# Patient Record
Sex: Male | Born: 1945 | Race: White | Hispanic: No | Marital: Married | Smoking: Never smoker
Health system: Southern US, Community
[De-identification: ages and names within clinical notes are randomized; demographics above are authoritative.]

## PROBLEM LIST (undated history)

## (undated) DIAGNOSIS — J986 Disorders of diaphragm: Secondary | ICD-10-CM

## (undated) DIAGNOSIS — J189 Pneumonia, unspecified organism: Secondary | ICD-10-CM

## (undated) DIAGNOSIS — Z8546 Personal history of malignant neoplasm of prostate: Secondary | ICD-10-CM

## (undated) DIAGNOSIS — R0902 Hypoxemia: Secondary | ICD-10-CM

## (undated) DIAGNOSIS — I4891 Unspecified atrial fibrillation: Secondary | ICD-10-CM

## (undated) DIAGNOSIS — I10 Essential (primary) hypertension: Secondary | ICD-10-CM

## (undated) DIAGNOSIS — C801 Malignant (primary) neoplasm, unspecified: Secondary | ICD-10-CM

## (undated) DIAGNOSIS — D649 Anemia, unspecified: Secondary | ICD-10-CM

## (undated) DIAGNOSIS — H539 Unspecified visual disturbance: Secondary | ICD-10-CM

## (undated) DIAGNOSIS — G4733 Obstructive sleep apnea (adult) (pediatric): Secondary | ICD-10-CM

## (undated) DIAGNOSIS — G629 Polyneuropathy, unspecified: Secondary | ICD-10-CM

## (undated) DIAGNOSIS — E785 Hyperlipidemia, unspecified: Secondary | ICD-10-CM

## (undated) HISTORY — DX: Pneumonia, unspecified organism: J18.9

## (undated) HISTORY — DX: Disorders of diaphragm: J98.6

## (undated) HISTORY — DX: Unspecified visual disturbance: H53.9

## (undated) HISTORY — PX: APPENDECTOMY: SHX54

## (undated) HISTORY — DX: Hypoxemia: R09.02

## (undated) HISTORY — PX: PROSTATECTOMY: SHX69

## (undated) HISTORY — DX: Unspecified atrial fibrillation: I48.91

## (undated) HISTORY — DX: Anemia, unspecified: D64.9

## (undated) HISTORY — DX: Hyperlipidemia, unspecified: E78.5

## (undated) HISTORY — DX: Malignant (primary) neoplasm, unspecified: C80.1

## (undated) HISTORY — PX: SHOULDER SURGERY: SHX246

## (undated) HISTORY — DX: Essential (primary) hypertension: I10

## (undated) HISTORY — DX: Personal history of malignant neoplasm of prostate: Z85.46

## (undated) HISTORY — DX: Polyneuropathy, unspecified: G62.9

## (undated) HISTORY — DX: Obstructive sleep apnea (adult) (pediatric): G47.33

---

## 1991-09-07 DIAGNOSIS — Z8546 Personal history of malignant neoplasm of prostate: Secondary | ICD-10-CM

## 1991-09-07 HISTORY — DX: Personal history of malignant neoplasm of prostate: Z85.46

## 2001-07-28 ENCOUNTER — Ambulatory Visit: Admission: RE | Admit: 2001-07-28 | Discharge: 2001-10-26 | Payer: Self-pay | Admitting: Radiation Oncology

## 2001-08-28 ENCOUNTER — Inpatient Hospital Stay (HOSPITAL_COMMUNITY): Admission: EM | Admit: 2001-08-28 | Discharge: 2001-08-29 | Payer: Self-pay | Admitting: Emergency Medicine

## 2001-09-05 ENCOUNTER — Ambulatory Visit: Admission: RE | Admit: 2001-09-05 | Discharge: 2001-09-05 | Payer: Self-pay | Admitting: Urology

## 2001-09-25 ENCOUNTER — Encounter (INDEPENDENT_AMBULATORY_CARE_PROVIDER_SITE_OTHER): Payer: Self-pay | Admitting: Specialist

## 2001-09-25 ENCOUNTER — Inpatient Hospital Stay (HOSPITAL_COMMUNITY): Admission: RE | Admit: 2001-09-25 | Discharge: 2001-09-28 | Payer: Self-pay | Admitting: Urology

## 2003-04-30 ENCOUNTER — Encounter: Admission: RE | Admit: 2003-04-30 | Discharge: 2003-04-30 | Payer: Self-pay | Admitting: Internal Medicine

## 2003-04-30 ENCOUNTER — Encounter: Payer: Self-pay | Admitting: Internal Medicine

## 2003-06-08 ENCOUNTER — Ambulatory Visit (HOSPITAL_COMMUNITY): Admission: RE | Admit: 2003-06-08 | Discharge: 2003-06-08 | Payer: Self-pay | Admitting: *Deleted

## 2003-06-17 ENCOUNTER — Ambulatory Visit (HOSPITAL_COMMUNITY): Admission: RE | Admit: 2003-06-17 | Discharge: 2003-06-17 | Payer: Self-pay | Admitting: *Deleted

## 2003-09-01 ENCOUNTER — Emergency Department (HOSPITAL_COMMUNITY): Admission: EM | Admit: 2003-09-01 | Discharge: 2003-09-01 | Payer: Self-pay | Admitting: Emergency Medicine

## 2007-05-06 ENCOUNTER — Inpatient Hospital Stay (HOSPITAL_COMMUNITY): Admission: EM | Admit: 2007-05-06 | Discharge: 2007-05-08 | Payer: Self-pay | Admitting: Emergency Medicine

## 2007-05-06 ENCOUNTER — Ambulatory Visit: Payer: Self-pay | Admitting: Cardiology

## 2007-06-09 ENCOUNTER — Encounter: Admission: RE | Admit: 2007-06-09 | Discharge: 2007-06-09 | Payer: Self-pay | Admitting: Internal Medicine

## 2007-06-20 ENCOUNTER — Encounter: Admission: RE | Admit: 2007-06-20 | Discharge: 2007-06-20 | Payer: Self-pay | Admitting: *Deleted

## 2007-07-01 ENCOUNTER — Inpatient Hospital Stay (HOSPITAL_COMMUNITY): Admission: EM | Admit: 2007-07-01 | Discharge: 2007-07-04 | Payer: Self-pay | Admitting: Emergency Medicine

## 2007-07-02 ENCOUNTER — Encounter (INDEPENDENT_AMBULATORY_CARE_PROVIDER_SITE_OTHER): Payer: Self-pay | Admitting: *Deleted

## 2007-07-02 ENCOUNTER — Ambulatory Visit: Payer: Self-pay | Admitting: *Deleted

## 2007-07-17 ENCOUNTER — Encounter (INDEPENDENT_AMBULATORY_CARE_PROVIDER_SITE_OTHER): Payer: Self-pay | Admitting: *Deleted

## 2007-07-17 ENCOUNTER — Ambulatory Visit (HOSPITAL_COMMUNITY): Admission: RE | Admit: 2007-07-17 | Discharge: 2007-07-17 | Payer: Self-pay | Admitting: *Deleted

## 2008-01-26 IMAGING — US US ABDOMEN COMPLETE
1 series · 14 of 25 positions shown · non-contrast
Comparison: 06/17/03.

CLINICAL DATA: Recent pancreatitis.  
 COMPLETE ABDOMINAL ULTRASOUND:
TECHNIQUE: Complete abdominal ultrasound examination was performed including evaluation of the liver, gallbladder, bile ducts, pancreas, kidneys, spleen, IVC, and abdominal aorta.

[Series 1: unknown · 0.43mm/px · 14 of 71 slices shown]
[im 1/71]
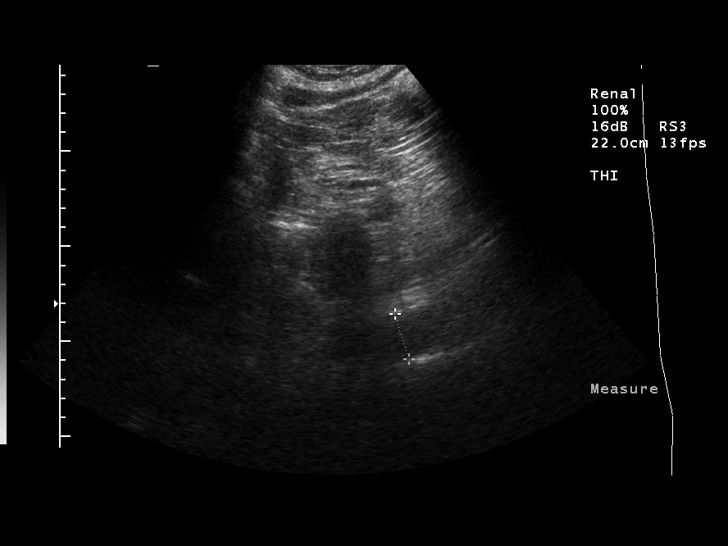
[im 6/71]
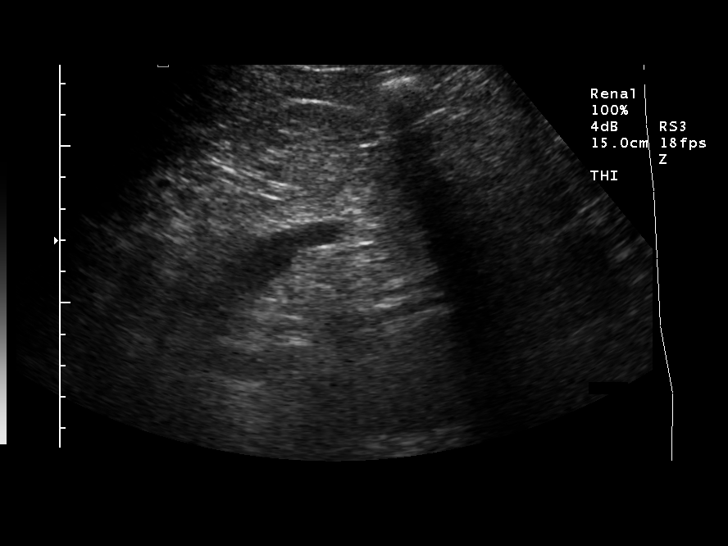
[im 12/71]
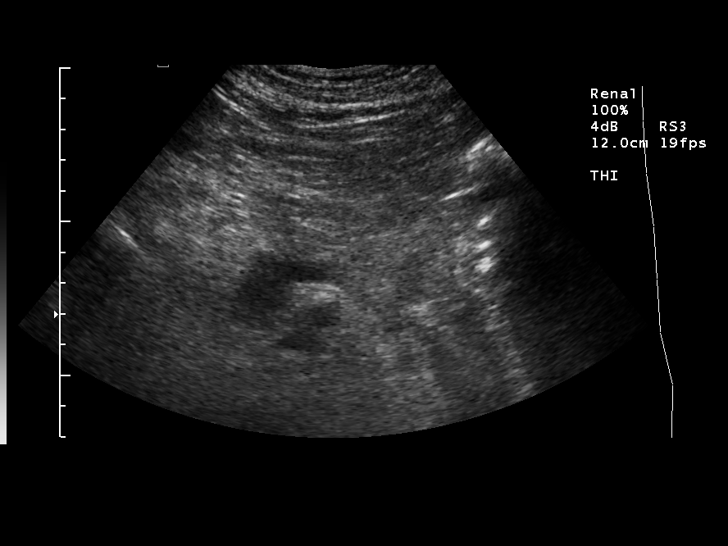
[im 18/71]
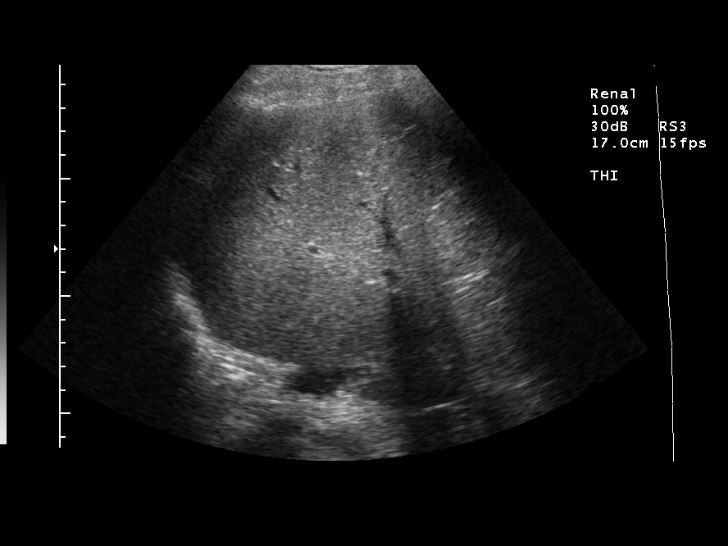
[im 24/71]
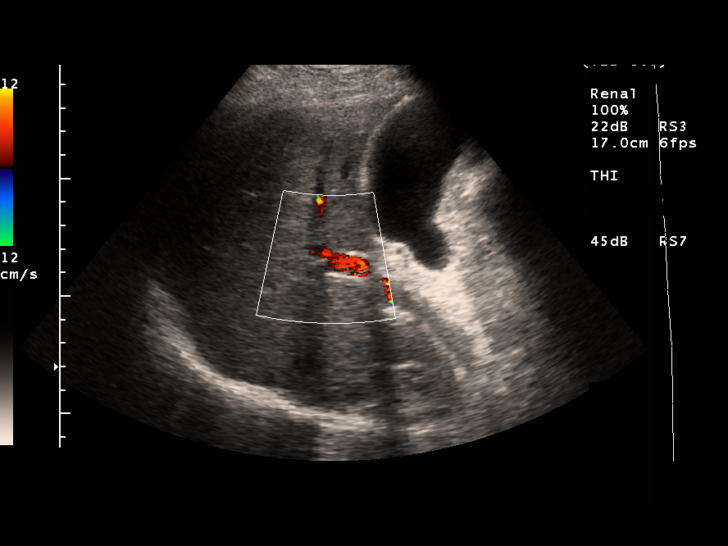
[im 27/71]
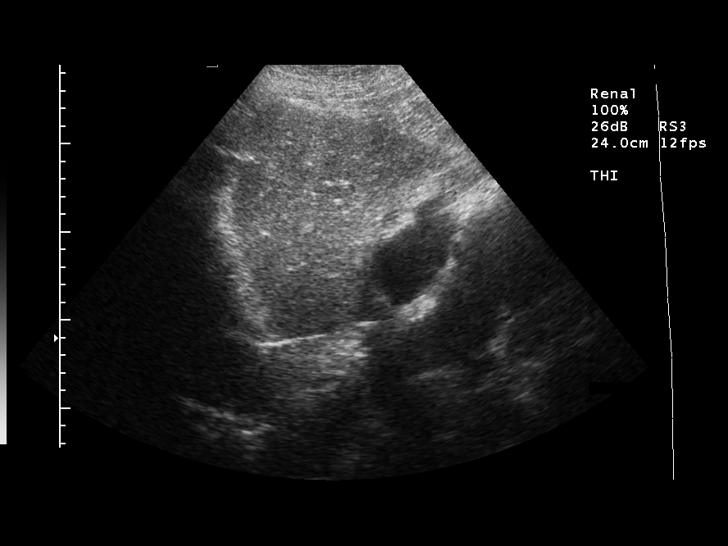
[im 33/71]
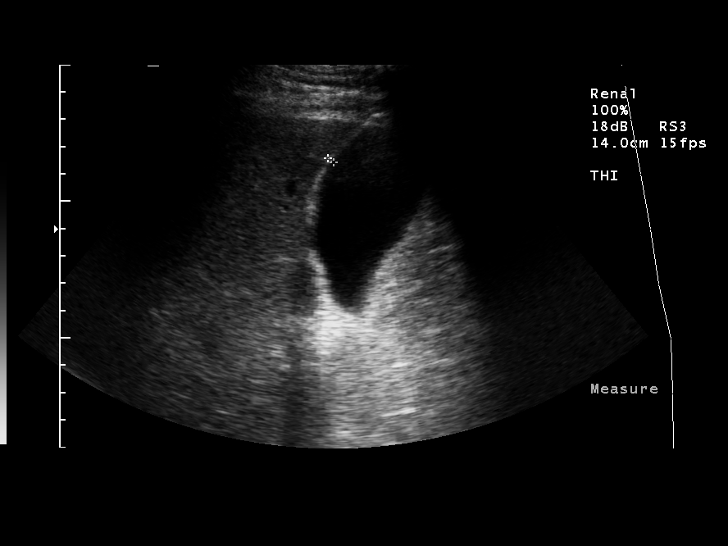
[im 38/71]
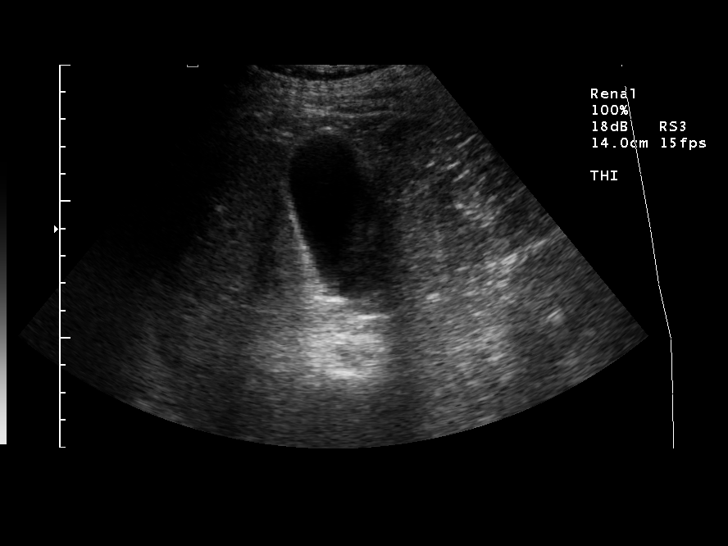
[im 44/71]
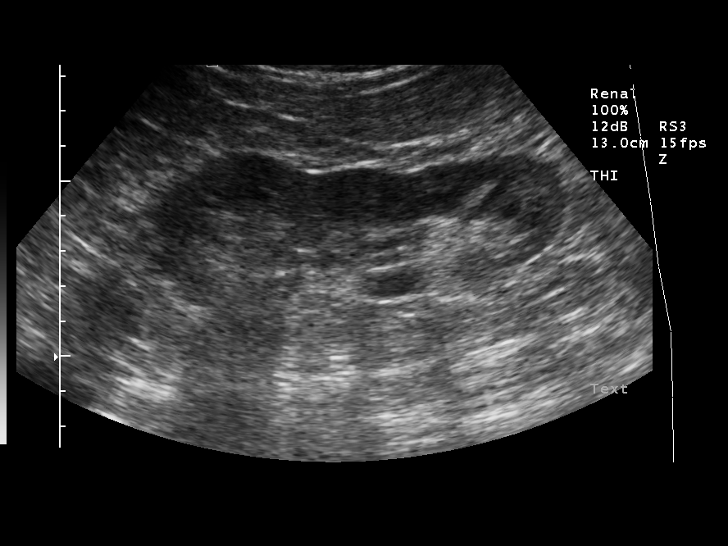
[im 47/71]
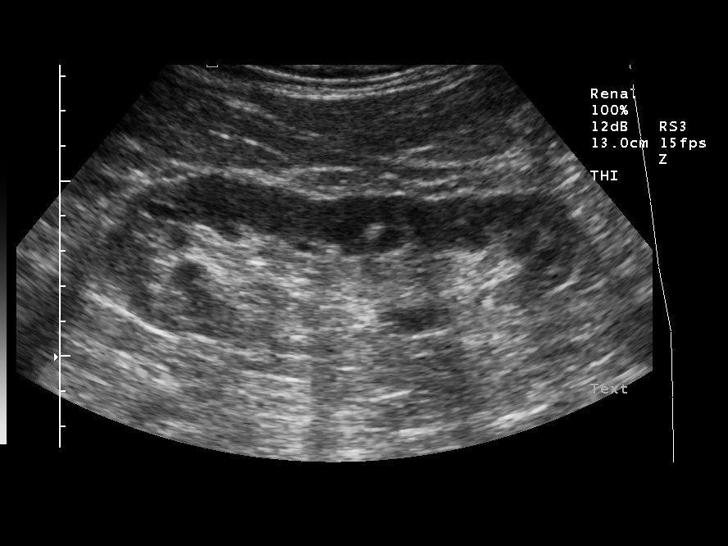
[im 53/71]
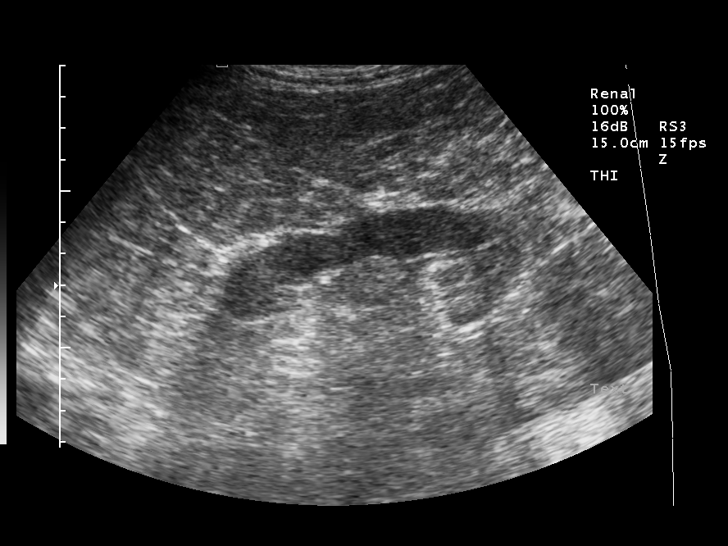
[im 59/71]
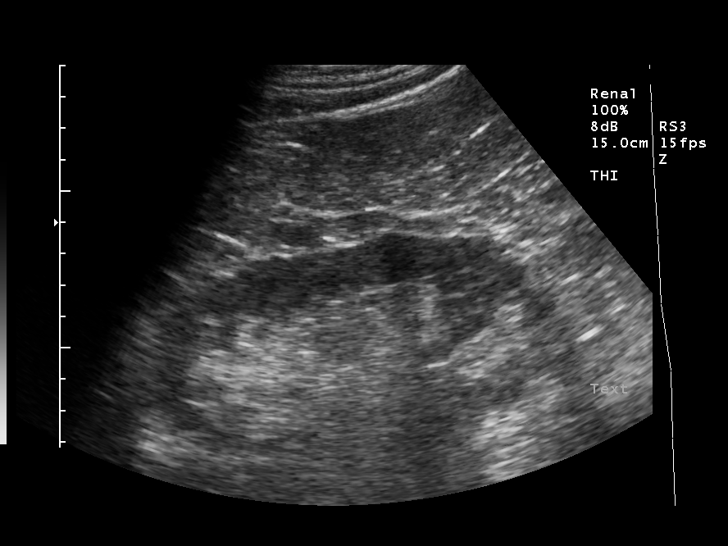
[im 65/71]
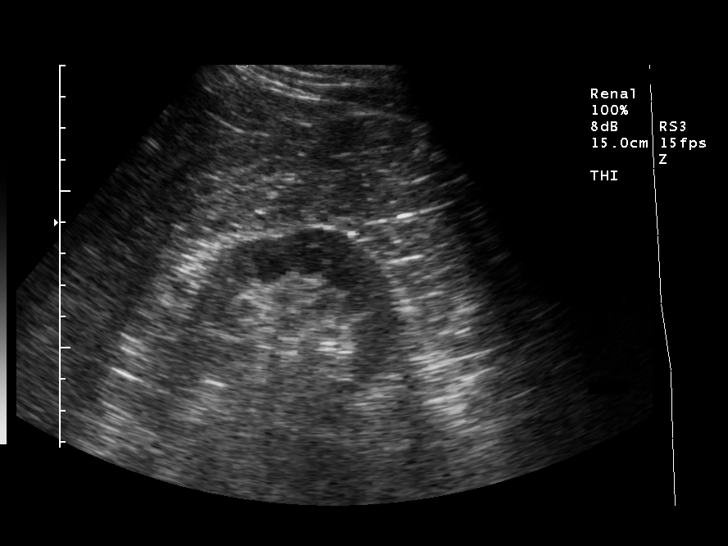
[im 71/71]
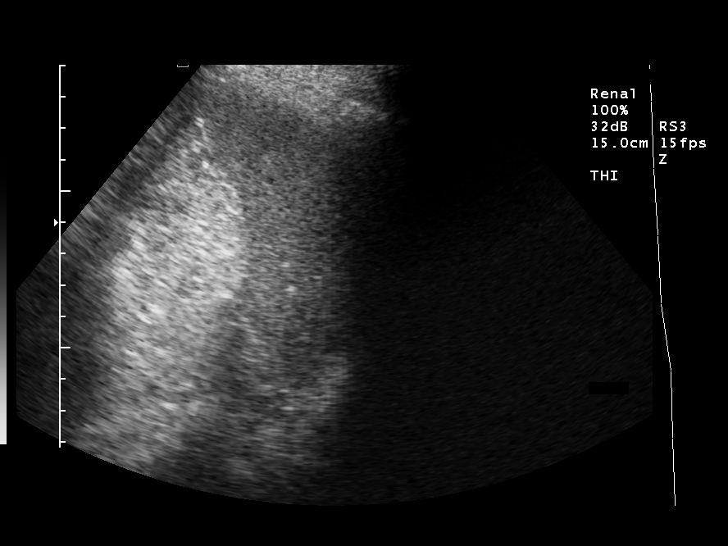

[14 of 25 positions shown; findings below may reference images not displayed]

FINDINGS: Liver, gallbladder, extrahepatic bile duct, IVC, pancreas, spleen, kidneys, and aorta are unremarkable.
IMPRESSION: No acute findings.

## 2008-04-07 ENCOUNTER — Ambulatory Visit (HOSPITAL_COMMUNITY): Admission: RE | Admit: 2008-04-07 | Discharge: 2008-04-07 | Payer: Self-pay | Admitting: *Deleted

## 2010-12-19 NOTE — Discharge Summary (Signed)
NAME:  [name], [name] NO.:  [id number]   MEDICAL RECORD NO.:  [id number]          PATIENT TYPE:  INP   LOCATION:  4732                         FACILITY:  [facility]   PHYSICIAN:  Ladell Pier, M.D.   DATE OF BIRTH:  03/29/1946   DATE OF ADMISSION:  05/06/2007  DATE OF DISCHARGE:                               DISCHARGE SUMMARY   DISCHARGE DIAGNOSES:  1. Malignant hypertension.  2. Bradycardia.  3. History of prostate cancer.  4. Gout.  5. Obstructive sleep apnea.  6. Obesity.  7. Hyperlipidemia.  8. Peripheral neuropathy.   PAST SURGICAL HISTORY:  1. Significant for bilateral corneal grafts in 1980 and 1981.  2. Appendectomy.  3. Prostate surgery.  4. Colonoscopy done in 2004.  5. A normal stress test in July 2007.  LVEF 72%.  6. Renal artery duplex at that time was normal.   DISCHARGE MEDICATIONS:  1. Clonidine 0.2 b.i.d.  2. Avapro 300 mg daily.  3. HCTZ 25 mg daily.  4. Quinapril 40 mg daily.  5. Lamictal 150 mg q.i.d.  6. Allopurinol 100 mg daily.  7. Multivitamin daily.  8. Fiber daily.  9. Toprol XL 100 mg daily.  10.Verapamil 180 mg twice daily.  11.Hydralazine 50 mg t.i.d.  12.Ambien 10 mg q.h.s. p.r.n.   PROCEDURES:  None.   CONSULTANTS:  None.   FOLLOW-UP APPOINTMENTS:  The patient to follow up with Dr. Randell Patient in one  week.   HISTORY OF PRESENT ILLNESS:  The patient is a 65 year old white male who  was doing well until he checked his blood pressure and noted it was  elevated.  He presented to his primary care physician who noted that his  blood pressure was 220/164.  He was transferred to the emergency room.  He does not have any chest pain or shortness of breath, dizziness or  headache.  See admission H&P for remainder of history, Past Medical  History, Family, Social History, Meds, Allergies, Review of Systems per  admission H&P.   PHYSICAL EXAMINATION ON DISCHARGE:  VITAL SIGNS:  Temperature 97.8,  pulse 76, respirations 20, blood  pressure 153/91, pulse ox 93% on room  air.  HEENT:  Normocephalic, atraumatic.  Pupils reactive to light  without erythema.  CARDIOVASCULAR:  Regular rhythm.  LUNGS:  Clear bilaterally.  ABDOMEN:  Positive bowel sounds.  EXTREMITIES:  No edema.   HOSPITAL COURSE:  Hypertension:  The patient was admitted to the  hospital and started on several different blood pressure medications.  Over the course of the hospitalization his blood pressure improved.  He  was placed on oral regimen.  On discharge his blood pressure 152/91.  On  discharge his blood pressure on clonidine was increased to __________  daily.  He will follow up with PCP in one day for blood pressure check,  and he will also continue to check his blood pressure daily at home and  present back to the ED or PCP if it is elevated.   DISCHARGE LABORATORIES:  2-D echocardiogram showed normal EF.  Chest x-  ray showed low lung volumes with bibasilar atelectasis, elevated  left  hemidiaphragm as before, distended stomach with air fluid levels.   Sodium 143, potassium 4, chloride 102, CO2 32, glucose 105, BUN 14,  creatinine 0.84.  Calcium 9.9.  WBC 9.5, hemoglobin 16.6, platelets 205.  Cardiac enzymes negative.  Homocysteine 7.9.  TSH 1.344.  PSA 0.01.      Ladell Pier, M.D.  Electronically Signed     NJ/MEDQ  D:  05/08/2007  T:  05/09/2007  Job:  [id number]   cc:   Soyla Murphy. Renne Crigler, M.D.

## 2010-12-19 NOTE — Op Note (Signed)
NAME:  TERRILL, ALPERIN NO.:  [id number]   MEDICAL RECORD NO.:  [id number]          PATIENT TYPE:  AMB   LOCATION:  ENDO                         [facility]ACILITY:  [facility]   PHYSICIAN:  Georgiana Spinner, M.D.    DATE OF BIRTH:  [birth year]   DATE OF PROCEDURE:  07/17/2007  DATE OF DISCHARGE:                               OPERATIVE REPORT   PROCEDURE:  Upper endoscopy with biopsy.   INDICATIONS:  Abdominal pain.   ANESTHESIA:  Fentanyl 60 mcg, Versed 6 mg.   PROCEDURE:  With the patient mildly sedated in the left lateral  decubitus position, the Pentax videoscopic endoscope was inserted in the  mouth, passed under direct vision through the esophagus, which appeared  normal, into the stomach.  Fundus, body appeared normal.  Antrum showed  some changes of the mucosa consistent with a possible inflammatory  process.  This was photographed and biopsied.  We entered into the  duodenal bulb through the pylorus, and second portion of the duodenum  was also visualized and appeared normal.  Photographs taken.  From this  point the endoscope was slowly withdrawn taking circumferential views of  the duodenal mucosa until the endoscope had been pulled back into the  stomach, placed in retroflexion to view the stomach from below.  The  endoscope was straightened and withdrawn taking circumferential views of  the remaining gastric and esophageal mucosa.  The patient's vital signs  and pulse oximeter remained stable.  The patient tolerated the procedure  well without apparent complications.   FINDINGS:  Question of inflammatory changes of antrum.   Await biopsy report.  The patient will call me for results and follow up  with me as an outpatient.           ______________________________  Georgiana Spinner, M.D.     GMO/MEDQ  D:  07/17/2007  T:  07/18/2007  Job:  [id number]

## 2010-12-19 NOTE — Op Note (Signed)
NAME:  [name], ZUFALL NO.:  [id number]   MEDICAL RECORD NO.:  [id number]          PATIENT TYPE:  AMB   LOCATION:  ENDO                         FACILITY:  [facility]   PHYSICIAN:  Georgiana Spinner, M.D.    DATE OF BIRTH:  October 24, 1945   DATE OF PROCEDURE:  DATE OF DISCHARGE:                               OPERATIVE REPORT   PROCEDURE:  Colonoscopy.   INDICATIONS:  Abdominal pain.   ANESTHESIA:  Fentanyl 75 mcg, Versed 6 mg.   PROCEDURE:  With the patient mildly sedated in the left lateral  decubitus position a rectal examination was attempted.  Subsequently,  the Pentax videoscopic colonoscope was inserted in the rectum and passed  under direct vision with pressure applied.  We reached the cecum  identified by ileocecal valve and appendiceal orifice, both of which  were photographed.  From this point the colonoscope was slowly withdrawn  taking circumferential views of colonic mucosa stopping in the rectum  which appeared normal on direct and showed hemorrhoids on retroflexed  view.  The endoscope was straightened and withdrawn.  The patient's  vital signs, pulse oximeter remained stable.  The patient tolerated the  procedure well with no apparent complications.   FINDINGS:  Internal hemorrhoids, otherwise an unremarkable exam.   PLAN:  To have the patient follow-up with me as an outpatient.           ______________________________  Georgiana Spinner, M.D.     GMO/MEDQ  D:  04/07/2008  T:  04/07/2008  Job:  [id number]

## 2010-12-19 NOTE — Discharge Summary (Signed)
NAME:  [name], [name] NO.:  [id number]   MEDICAL RECORD NO.:  [id number]          PATIENT TYPE:  INP   LOCATION:  2004                         FACILITY:  [facility]   PHYSICIAN:  Mobolaji B. Bakare, M.D.DATE OF BIRTH:  02/07/1946   DATE OF ADMISSION:  07/01/2007  DATE OF DISCHARGE:  07/04/2007                               DISCHARGE SUMMARY   PRIMARY CARE PHYSICIAN:  Soyla Murphy. Renne Crigler, M.D.   CARDIOLOGIST:  Antionette Char, M.D.   FINAL DIAGNOSIS:  Presyncope with hypotension and bradycardia, probably  vasovagal versus medication related.   SECONDARY DIAGNOSES:  1. History of difficult-to-control hypertension on multiple blood      pressure medications.  2. Obstructive sleep apnea.   PROCEDURES:  Carotid Dopplers showed no significant internal carotid  stenosis of plaque which align bilaterally.  The vertebral arterial flow  was  antegrade.   CONSULTATIONS:  Cardiology consultation provided by Dr. Aleen Campi.   HISTORY:  In brief, [name] is a pleasant 65 year old Caucasian  male with history of difficult to control hypertension.  He has had  extensive outpatient work-up for treatable causes for treatable  hypertension including adrenal and renal work-up.  He was recently  hospitalized in October 2008.  At that time he was treated for malignant  hypertension.  His medications were adjusted and prior to  hospitalization, the patient was on clonidine, verapamil, Avapro,  hydralazine, and Metoprolol.  The patient was in his usual state of health until the day of admission  when he went for followup appointment with Dr. Renne Crigler.  As he got out of  his car and advanced towards the office, he became weak and almost  passed out.  He did not pass out.  He gently lowered himself onto the  floor in the office, his blood pressure  was checked, he was hypotensive  and he was bradycardic in the 30s.  EMS was called. The patient was  brought to the  emergency room.   Upon  arrival in the emergency room, blood pressure was 77/40, pulse of  43.  The patient was mentally alert.  He was admitted for further  evaluation and hypertensives were held.   HOSPITAL COURSE:  Presyncope.  This was felt to be related to multiple  medications.  All his hypertensives were temporarily held. The patient  continued to be bradycardic during the first 24 hours of  hospitalization.  He was seen by Dr. Aleen Campi. At that time his blood  pressure had come up to 170/80 with a heart rate in the 70s.  He was  restarted on hydralazine, Toprol-XL, and Avapro.  He opined that the  patient may require minoxidil if blood pressure is uncontrolled, in  order to avoid bradycardic effect of verapamil, Toprol-XL and clonidine.   During the course of hospitalization, the patient did not have recurrent  episode of presyncope.  He was not orthostatic. He has been ambulating  without difficulty.  He is currently on Metoprolol 50 mg in the a.m. and  one at night, hydralazine 50 mg t.i.d. and Avapro 300 mg daily.  Blood  pressure  this morning was 142/88.  He will be discharged on his  medications.  I have instructed him to check his blood pressure daily  and showed the records to Dr. Renne Crigler and Dr. Aleen Campi during his next  visit early next week.  He had a carotid Doppler which was unremarkable.  The patient had a 2-D echo in October 2008, during his most recent  hospitalization and this was unremarkable for any valvular lesion.  The  patient had three negative sets of cardiac enzymes.   DISCHARGE MEDICATIONS:  1. Metoprolol 50 mg in the morning and one at night.  2. Hydralazine 50 mg t.i.d.  3. Avapro 300 mg daily.  4. Ambien 10 mg at bedtime p.r.n.  5. Allopurinol 100 mg daily.  6. Lamictal 150 mg two tablets in the a.m. and two tablets in the p.m.  7. Aspirin 81 mg daily.  8. Fiber caplets twice a day.  9. Multiple vitamins daily.   DISCHARGE LABORATORY DATA:  Magnesium 2.2, white count  6.3, hemoglobin  12.1, hematocrit 35.8, platelets 181.  Sodium 159, potassium 3.6,  chloride 106, bicarb 23, glucose 93, BUN 12, creatinine 0.92, bilirubin  1.2, AST 21, ALT 20, total protein 5.4, albumin 3.2, Cardizem 8.4,  phosphorus 2.7.   CONDITION ON DISCHARGE:  Stable, ambulating without difficulty.  Prior  to the time of discharge temperature 97.8, pulse 83, respiratory rate  20, oxygen saturation 95% on room air, blood pressure 142/88.   FOLLOW UP:  Dr. Renne Crigler within one week.  Follow up with Dr. Aleen Campi  within one week.      Mobolaji B. Corky Downs, M.D.  Electronically Signed     MBB/MEDQ  D:  07/04/2007  T:  07/04/2007  Job:  [id number]   cc:   Soyla Murphy. Renne Crigler, M.D.  Antionette Char, MD

## 2010-12-19 NOTE — H&P (Signed)
NAME:  [name], [name] NO.:  [id number]   MEDICAL RECORD NO.:  [id number]          PATIENT TYPE:  EMS   LOCATION:  MAJO                         FACILITY:  [facility]   PHYSICIAN:  Herbie Saxon, MDDATE OF BIRTH:  Dec 06, 1945   DATE OF ADMISSION:  05/06/2007  DATE OF DISCHARGE:                              HISTORY & PHYSICAL   PRIMARY CARE PHYSICIAN:  Soyla Murphy. Renne Crigler, M.D.   UROLOGIST:  Excell Seltzer. Annabell Howells, M.D.   HEALTHCARE:  [facility].  His wife, [name].   PRESENTING PROBLEM:  Elevated blood pressure of 1 day duration.   HISTORY OF PRESENT ILLNESS:  This 65 year old Caucasian male was quite  well until this morning when he was checking his blood pressure and  noticed it was severely elevated.  He presented to his primary care  physician and it was noticed that the blood pressure was up to 220/164,  necessitating his being transferred to the emergency room.  Patient  denies any chest pain, shortness of breath, dizziness, headache,  paroxysmal nocturnal dyspnea, orthopnea, or palpitations.  He has  worsening bilateral hand tingling.  He does have a past history of  peripheral neuropathy.  There are no symptoms referable to the  genitourinary, gastrointestinal, respiratory, cardiovascular or  neurological system.  There is no new skin rash or joint swelling.  Patient claims good compliance with his blood pressure medication.  He  on a low cholesterol diet for hypercholesterolemia.   PAST MEDICAL HISTORY:  Hypertension, stage 3 cancer of prostate, gout,  obstructive sleep apnea, obesity, hypercholesterolemia, peripheral  neuropathy.   PAST SURGICAL HISTORY:  Bilateral corneal graft in 1980 and 1981.  Appendectomy.  Prostate surgery.  Colonoscopy normal in 2004 November.  He had a normal stress test in July 2007, with ejection fraction of 72%.  Renal arterial duplex at that time was also normal.   FAMILY HISTORY:  Mother had coronary artery disease and  congestive heart  failure.  Father and brother with emphysema.  He has a sister with  cardiomyopathy.   SOCIAL HISTORY:  He is a retired Optician, dispensing.  He is married.  No history  of alcohol, tobacco or drug abuse.  He has two daughters.   ALLERGIES:  HE IS ALLERGIC TO PENICILLIN AND SULFA.  Intolerant to  codeine.   MEDICATIONS:  1. Clonidine 0.12 mg b.i.d.  2. Benacol 4/25 mg daily.  3. Quinapril 40 mg daily.  4. Lamictal 600 mg b.i.d.  5. Allopurinol 100 mg daily.  6. Multivitamin 1 tablet daily.  7. Fiber 1 tablet daily.  8. Micardis, dose unknown.  9. Toprol XL 100 mg b.i.d.  10.Ambien 10 mg daily.  11.Advil p.r.n.  12.MiraLax p.r.n.  13.Verapamil 180 mg b.i.d.   REVIEW OF SYSTEMS:  Twelve systems reviewed.  Positive as above.   PHYSICAL EXAMINATION:  GENERAL:  He is an elderly man not in acute  respiratory distress.  VITAL SIGNS:  Temperature 98, pulse 51 until rebounded to 64.  Respiratory rate 16, blood pressure 220/106.  RESPIRATORY:  Not tachypnea.  EXTREMITIES:  There is no clubbing or cyanosis.  There joint is not  peeled.  HEENT:  Head is atraumatic, normocephalic.  Oropharynx and nasopharynx  are clear.  Mucous membranes are moist.  Pupils equal, reactive to light  and accommodation.  Extraocular muscles are intact.  NECK:  Supple.  There is no evidence of JVD or thyromegaly.  There are  no carotid bruits or submandibular lymphadenopathy.  CHEST:  Clear clinically.  CARDIOVASCULAR:  S1 and S2 regular.  No murmurs.  ABDOMEN:  Soft, nontender, no organomegaly.  Bowel sounds are normal.  He has truncal obesity.  NEUROLOGICAL:  Cranial nerves  are intact.  He is oriented x3.  Cranial  nerves I-XII intact.  Power is 5+ globally.  Deep tendon reflexes 2+.  He has reduced sensation in gloves and stockings distribution.  EXTREMITIES:  Peripheral pulses are present.  No pedal edema.   LABORATORY DATA:  The available labs show EKG with sinus bradycardia at  51 per  minute with AQ waves in 2, 3 and AVF.  No ST-T changes.   The chemistry shows a sodium of 134, potassium 4.6, chloride 105,  bicarbonate 28, BUN 17, creatinine 1.0, glucose 115.  Urinalysis is  negative.   ASSESSMENT:  1. Hypertensive crisis.  2. Bradycardia asymptomatic.  3. Prostate cancer stage 3.  4. Hypercholesterolemia.  5. Obesity.  6. Obstructive sleep apnea.  7. Peripheral neuropathy.   PLAN:  Patient is to be admitted to telemetry bed.  We will stabilize  his blood pressure with enalapril 2.5 mg IV q.6 hours p.r.n., Coumadin  0.2 mg p.o. q.8 hour p.r.n., and hydralazine 50 mg q.6 hours.  Gently  reduce the Toprol XL 50 mg b.i.d. and avoid if heart rate is lower than  50.  Cardizem 180 mg b.i.d.  Benacol 40/25 one daily.  We will check his  echocardiogram, PSA, check an ABG on room air and consider for possible  CPAP q.h.s.  Will obtain a  lipid panel, thyroid function test,  cardiac enzymes and EKG q.8 hours  x3.  He is to be on strict bedrest.  Will also put him on p.r.n.  morphine and nitroglycerin, as he has chest pain.  Continue with his own  medications Lamictal, allopurinol, Colace.  Patient is a full code.      Herbie Saxon, MD  Electronically Signed     MIO/MEDQ  D:  05/06/2007  T:  05/07/2007  Job:  [id number]   cc:   Soyla Murphy. Renne Crigler, M.D.

## 2010-12-19 NOTE — H&P (Signed)
NAMEEANN, CLELAND NO.:  [id number]   MEDICAL RECORD NO.:  [id number]          PATIENT TYPE:  INP   LOCATION:  2116                         FACILITY:  [facility]   PHYSICIAN:  Beckey Rutter, MD  DATE OF BIRTH:  1946/07/26   DATE OF ADMISSION:  07/01/2007  DATE OF DISCHARGE:                              HISTORY & PHYSICAL   PRIMARY CARE PHYSICIAN:  Soyla Murphy. Renne Crigler, M.D.   CHIEF COMPLAINT:  Syncope.   HISTORY OF PRESENT ILLNESS:  This is a 65 year old pleasant, Caucasian  male with past medical history as below.  He was in his usual state of  health this morning secondary to his routine visit to his primary  physician, Dr. Renne Crigler.  Upon getting out of his car, he started to feel  dizzy and he softly hit the floor.  The patient then was taken inside  the office, and at that time he was found to have bradycardia in the 30s  and hypotension with systolic blood pressure in the low 70s.  The  patient continued to feel dizziness, but improved over the time.  The  patient then was sent to the emergency department here.  The patient has  uncontrolled hypertension and recently had change in his blood pressure  medication regimen.  He denied fever or headache.   PAST MEDICAL HISTORY:  1. Hypertension.  2. Stage 3 cancer of prostate.  3. Gout.  4. Obstructive sleep apnea.  5. Obesity.  6. Hypercholesterolemia.  7. Peripheral neuropathy.   PAST SURGICAL HISTORY:  1. Bilateral corneal graft in 1980 and 1981.  2. Appendectomy.  3. Prostate surgery.  4. Colonoscopy, reported normal in 2004.  5. Normal stress test in July 2007 with ejection fraction of 72%.      Renal artery duplex at that time was also normal   FAMILY HISTORY:  Mother had coronary artery disease and congestive heart  failure.  Father and brother with emphysema/COPD and sister with  cardiomyopathy.   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   The patient has two daughters.   MEDICATION ALLERGY:  PENICILLIN AND SULFA.   MEDICATIONS:  1. Multivitamin once per day.  2. Fiber tablets twice per day.  3. Baby aspirin one per day.  4..  Lamictal 150 mg four per day.  1. Allopurinol 100 mg daily.  2. Benicar HCT 40/25 daily.  3. Verapamil 180 mg twice a day.  4. Clonidine HCl 0.01 mg twice a day.  5. Avapro 300 mg daily.  6. Hydralazine 50 mg three times a day.  7. Metoprolol 50 mg half tab in the morning and one tablet at night.  8. Ambien 10 mg at night as needed.   REVIEW OF SYSTEMS:  No fever, cough or sweating.  The rest of review of  systems unremarkable.   PHYSICAL EXAMINATION:  VITAL SIGNS:  Blood pressure 92/47, first blood  pressure recorded on presentation at 12:37 was 77/40, heart rate 46,  first heart rate recorded is 43, and respiratory rate is 18, Temperature  96.7.  GENERAL:  The patient was lying flat in bed, not in acute distress.  HEAD:  Great atraumatic, normocephalic.  Eyes: PERRL.  Mouth moist.  No  ulcer.  NECK:  Supple.  No JVD.  HEART:  Precordium first second heart sound audible.  No added sounds.  LUNGS:  Bilateral fair air entry.  ABDOMEN:  Soft, nontender.  Bowel sounds present.  EXTREMITIES:  No lower extremities edema.  GU: No CVA tenderness.  NEUROLOGICAL:  Patient alert, oriented and giving history.   LABORATORY DATA AND X-RAYS:  His microscopic urine showing few bacteria  with negative nitrate and leukocyte esterase.  Troponin is less than  0.05.  PTT is 26.  PT is 12.7, INR 0.9.  sodium 140, potassium 4.5,  chloride 105, glucose 100, BUN 19 and creatinine is 1.3.  CBC showing  white blood count 10.7, hemoglobin 12.3, hematocrit 37.0, platelet count  231.  Chest x-ray done today showing low level of aspiration with a  stable bibasilar volume loss.  No significant changes.  CT head  preliminary report showing no bleeding.  No CT evidence of large or  acute infarct.  Suspect all small  infarcts __________ , right thalamus  and post left corona radiata.  EKG pending   ASSESSMENT AND PLAN:  This is a 65 year old pleasant Caucasian male who  presented with syncope likely secondary to his antihypertensive  medication with combination of verapamil metoprolol significantly  lowering his heart rate.   PLAN:  1. The patient will be admitted to telemetry floor for further      assessment and management.  2. We will start the syncope workup with carotid duplex and will      further evaluate depending on the blood pressure monitoring.  3. For tonight, I will hold all his antihypertensive medications.  We      will reinstate this medication in the morning, again depending on      the blood pressure monitoring since up to now the patient is in the      lower side of blood      pressure and also he is bradycardic.  The patient is taking      verapamil and metoprolol, will probably not start metoprolol or      will start with very lower dose when those medications are going to      be reinstated.  For DVT prophylaxis I will start Lovenox.  For GI      prophylaxis I will start Protonix.      Beckey Rutter, MD  Electronically Signed     EME/MEDQ  D:  07/01/2007  T:  07/02/2007  Job:  [id number]   cc:   Soyla Murphy. Renne Crigler, M.D.  Excell Seltzer. Annabell Howells, M.D.  [employer]

## 2010-12-22 NOTE — Consult Note (Signed)
. [facility]  Patient:    [name], [name] Visit Number: [id number] MRN: [id number]          Service Type: MED Location: 2000 2023 01 Attending Physician:  [name] Dictated by:   [name], M.D. Proc. Date: 08/29/01 Admit Date:  08/28/2001 Discharge Date: 08/29/2001   CC:         [name], M.D.  Glenn Seltzer. [name], M.D.   Consultation Report  REASON FOR CONSULTATION:  It is a privilege to be asked to participate in the care of the very nice, 65 year old, [name], date of birth January 31, 1946, by providing consultative services, at [name] request, in regard to the patients chest pain.  HISTORY OF PRESENT ILLNESS:  He said that last night while folding laundry, he developed sharp anterior upper sternal discomfort described by placing a single fingertip over the area, a pain which was sharp in quality, lasted two to three hours, and was unassociated with palpitations, dyspnea, nausea, or sweats.  He had no chest pain by the time he arrived in the emergency room. He had a similar episode, lasting approximately five minutes, several weeks ago and says that he was hospitalized overnight about 15 years ago with chest discomfort that was felt to be due to stress.  He had a thallium stress test about nine years ago in [place], [place].  Apparently this was done as a "screening test" because of high blood pressure and high lipids.  He was on Lipitor for several years.  He has not been on Lipitor for some time and has had progressively severe problems with neuropathy for which he is now reevaluated at [facility].  His blood pressure was quite elevated on admission, but he was on four antihypertensive drugs.  PAST MEDICAL HISTORY:  His other past medical history incudes diverticulosis, the peripheral neuropathy noted above for which no cause has been found, prostate cancer for which he is to have a  prostatectomy in two weeks by Dr. Annabell [name], gout for which he is taking allopurinol 100 mg daily, and benign palpitations for which he takes Toprol XL.  CURRENT MEDICATIONS:  1. Lamictal 200 mg b.i.d. for neuropathy.  2. Methadone 5 mg every 8 to 12 hours.  3. Lidoderm patch as needed for leg pain.  4. Allopurinol 100 mg daily.  5. Verapamil 360 mg daily.  6. Atacand HCT 12.5 mg daily.  7. Accupril 20 mg daily.  8. Toprol XL 100 mg b.i.d.  9. Allegra 80 mg daily as needed. 10. Ambien 5 to 10 mg p.r.n.  FAMILY HISTORY:  His mother died of a heart attack at 61.  His father, a smoker, died of COPD in his 45s.  A brother, now 59 and also a smoker, has COPD.  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  He lives at home with his wife.  They have two daughters, ages 38 and 19, who live in [place] and [place].  REVIEW OF SYSTEMS:  Constitutional: He denies fevers, chills, or sweats.  His walking is limited but does not have claudication.  His sleep is disturbed by leg pain many nights.  He has no edema.  Eyes: No diplopia or blurring.  ENT: No deafness or dizziness.  He does wear glasses.  Respiratory: No cough or wheezing.  He has never smoked.  Cardiac: History of hypertension and palpitations as noted above.  GI: He has hemorrhoids but no heartburn and no known history of gastroesophageal reflux disease.  GU: No dysuria or pyuria. His prostate history is noted above.  He says he urinates at night only if neuropathy wakes him up.  Musculoskeletal: No swollen joints or myalgia.  Skin and breasts: No rash or nodule.  Neurologic: Peripheral neuropathy as noted above.  He has had no faintness or syncope.  Psychiatric: No depression or hallucinations.  Endocrine: No diabetes or thyroid disease.  Hematologic and lymph: No swelling in neck, axillae, or groin.  Allergic:  He is intolerant to PENICILLIN and CODEINE.  Not mentioned in the past history  above was the fact that he had corneal transplants x 3 in 1996.  PHYSICAL EXAMINATION:  VITAL SIGNS:  On admission, blood pressure 207/97; subsequently, it has been in the range of 150/90.  Pulse 70 and regular, respirations 18 and unlabored.  GENERAL:  He is a well-developed, well-nourished man now in no acute distress.  He is oriented to person, place, and time.  His mood and affect are appropriate.  HEENT:  Conjunctivae and lids reveal no icterus or arcus senilis.  He has his own teeth which are in fair repair with a good many crowns.  Oral mucosa reveals no pallor, cyanosis.  NECK:  Supple and symmetric.  Trachea is midline and mobile.  There is no palpable thyromegaly or cervical nodes.  No carotid bruit or JVD.  LUNGS:  Respiratory effort is normal.  His lungs are clear to auscultation and percussion.  BACK:  Straight without kyphosis, scoliosis, or point tenderness.  NEUROLOGIC:  His gait is not tested.  He could not undergo a stress test.  His muscle strength and tone are normal in his arms.  HEART:  Cardiac, apical, pulse cryptic.  The left border of cardiac dullness is within the left anterior axillary line.  There is no palpable precordial impulse.  The heart rhythm is regular.  Rate is normal.  There is no gallop, click, or murmur.  The digits and nails reveal no clubbing or cyanosis.  SKIN AND SUBCUTANEOUS TISSUE:  No stasis dermatitis or ulcer.  ABDOMEN:  Moderately obese and nontender.  There is no palpable enlargement of liver or spleen.  There is no mass.  The abdominal aorta was not palpable, and there is no bruit.  EXTREMITIES:  The femoral arteries are without bruit.  The pedal pulses are intact, 3+ posterior tibial and dorsalis pedis bilaterally.  His legs reveal no edema or varicosity.  LABORATORY DATA:  Includes two negative CK with MBs, two negative troponin.   EKG shows left ventricular hypertrophy marginally by voltage criteria and nonspecific  ST flattening in multiple leads.  EKG repeated this morning was unchanged.  ASSESSMENT/PLAN:  At this point, [name] can be defined as having atypical chest pain with no objective evidence of significant myocardial ischemia.  He has had no arrhythmia, and his blood pressure is under better control.  A low potassium level was noted and is being treated.  He will require further cardiac clearance before his prostatectomy, but I believe he can be discharged now with a very low level of risk, outpatient Persantine Cardiolite testing.  We will be happy to assist with arrangements for that.  At this point, I would not suggest any changes in his outpatient medications.  I appreciate the opportunity to participate in his care.Dictated by:   [name], M.D. Attending Physician:  [name] DD:  08/29/01 TD:  08/30/01 Job: [id number]

## 2010-12-22 NOTE — Op Note (Signed)
   NAME:  [name], [name] NO.:  [id number]   MEDICAL RECORD NO.:  [id number]                   PATIENT TYPE:  AMB   LOCATION:  ENDO                                 FACILITY:  [facility]   PHYSICIAN:  Georgiana Spinner, M.D.                 DATE OF BIRTH:  12-11-1945   DATE OF PROCEDURE:  06/08/2003  DATE OF DISCHARGE:                                 OPERATIVE REPORT   PROCEDURE PERFORMED:  Upper endoscopy.   ENDOSCOPIST:  Georgiana Spinner, M.D.   INDICATIONS FOR PROCEDURE:  Abdominal pain.   ANESTHESIA:  Demerol 60 mg, Versed 6 mg.   DESCRIPTION OF PROCEDURE:  With the patient mildly sedated in the left  lateral decubitus position, the Olympus video endoscope was inserted in the  mouth and passed under direct vision through the esophagus which appeared  normal into the stomach.  The fundus, body, antrum, duodenal bulb and second  portion of the duodenum all appeared normal.  From this point, the endoscope  was slowly withdrawn taking circumferential views of the entire duodenal  mucosa until the endoscope was pulled back into the stomach and placed on  retroflexion to view the stomach from below.  The endoscope was then  straightened and withdrawn taking circumferential views of the remaining  gastric and esophageal mucosa.  The patient's vital signs and pulse oximeter  remained stable.  The patient tolerated the procedure well without apparent  complications.   FINDINGS:  Unremarkable examination.   PLAN:  Proceed to colonoscopy.                                               Georgiana Spinner, M.D.    GMO/MEDQ  D:  06/08/2003  T:  06/08/2003  Job:  [id number]

## 2010-12-22 NOTE — Discharge Summary (Signed)
[facility]  Patient:    [name], [name] Visit Number: [id number] MRN: [id number]          Service Type: SUR Location: 3W 4782 01 Attending Physician:  [name] Dictated by:   [name], M.D. Admit Date:  09/25/2001 Discharge Date: 09/28/2001   CC:         [name]. [name], M.D.   Discharge Summary  HISTORY OF PRESENT ILLNESS:  [name] is a 65 year old white male who was initially referred by Dr. Renne [name] for an elevated PSA.  He underwent a prostate biopsy, and was found to have a Gleason 7 adenocarcinoma of the prostate involving 5% of the right biopsy specimen.  After discussing the treatment options, he has elected radical prostatectomy.  ALLERGIES:  PENICILLIN, CODEINE.  PREOPERATIVE MEDICATIONS:  1. Verapamil 360 mg q.d.  2. Accupril 20 mg q.d.  3. Atacand/HCT 12.5 mg q.d.  4. Toprol XL 100 mg b.i.d.  5. Allopurinol 100 mg q.d.  6. Lamictal 200 mg b.i.d.  7. Aspirin 325 mg q.d. which he stopped preoperatively.  8. Allegra 180 mg q.d.  9. Methadone p.r.n. 10. Ambien p.r.n.  PAST MEDICAL HISTORY: 1. Hypertension. 2. Irritable bowel syndrome. 3. Peripheral neuropathy. 4. Migraines. 5. Gout. 6. Allergies.  PAST SURGICAL HISTORY: 1. Appendectomy in 1958. 2. Bilateral corneal transplants.  SOCIAL HISTORY:  Negative for tobacco and alcohol.  He is disabled from his peripheral neuropathy.  FAMILY HISTORY:  Significant for diabetes.  REVIEW OF SYSTEMS:  Pain in his back and legs bilaterally.  He is otherwise without complaints.  PHYSICAL EXAMINATION:  VITAL SIGNS:  Blood pressure 185/98, heart rate 58, temperature 98.  GENERAL:  He is a well-developed, well-nourished white male in no acute distress.  Alert and oriented x3.  HEENT:  Normocephalic, atraumatic.  NECK:  Supple without thyromegaly or bruits.  No cervical, axillary, or inguinal adenopathy.  LUNGS:  Clear.  HEART:  Regular rate and  rhythm.  ABDOMEN:  Soft, moderately obese, nontender, without masses, lesions, CVA tenderness, or hepatosplenomegaly.  No hernias noted.  GENITOURINARY:  Unremarkable phallus and adequate meatus.  Scrotum was unremarkable.  Testes were bilaterally descended.  Normal in size and consistency without masses or tenderness.  Epididymes were unremarkable.  No inguinal hernias were noted.  Anus and perineum without lesions.  RECTAL:  Normal sphincter tone, prostate is 1 to 2+ in size, benign consistency without nodules.  Seminal vesicles nonpalpable.  No rectal masses are noted.  EXTREMITIES:  Full range of motion without edema.  NEUROLOGIC:  Grossly intact.  SKIN:  Warm and dry.  IMPRESSION:  Prostate cancer.  PLAN:  Radical prostatectomy.  LABORATORY DATA:  CBC unremarkable with a hemoglobin of 15.  BMP is within normal limits.  Blood type O+, antibody negative.  PT 12.6, PTT 29.  HOSPITAL COURSE:  On the day of admission the patient was taken to the operating room where he underwent an uneventful retropubic prostatectomy with pelvic lymphadenectomy.  He was left with a Foley catheter and wound drain. There was no evidence of gross tumor extension beyond the prostate at the time of surgery.  On the first postoperative day, he was sore, but otherwise without complaints.  He was afebrile.  His abdomen was soft with positive bowel sounds.  He had minimal Jackson-Pratt drainage, good urine output.  His diet was increased, and he was encouraged to increase his ambulation.  His IV fluids were maintained at 150 cc an hour.  His  urine output was down just a little bit.  On the second postoperative day, he had a temperature max of 101.5, but was afebrile.  At the time of examination his Jackson-Pratt had put out 15 cc for the past shift.  He was tolerating a diet.  He was otherwise without complaints.  His PCA pain pump was discontinued.  On postoperative day #3, he had a temperature max of  100.7, it was 99.2 at time of visit. Jackson-Pratt had minimal drainage.  Urine output was adequate.  His wound was intact.  He had a little bit of nausea throughout the night, but that had improved.  He tolerated his breakfast, and was felt to be ready for discharge home.  His Jackson-Pratt drain was removed prior to discharge.  His pathology demonstrated a Gleason score of 7, 4+3, with involvement of apical surgical margins, could not rule out extracapsular extension, so it was felt to represent a T3, N0, M0 prostate cancer.  FINAL DIAGNOSIS:  T3, N0, M0 prostate cancer.  COMPLICATIONS:  None.  DISCHARGE MEDICATIONS: 1. Tylox one or two p.o. q.4-6h. p.r.n. pain. 2. Levaquin 250 mg p.o. q.d. 3. Resume his preoperative medications.  ACTIVITY:  Restrictions explained.  FOLLOWUP:  With me in one week for removal of his staples, a week after that for removal of his catheter.  DISPOSITION:  To home.  CONDITION ON DISCHARGE:  Improved.  PROGNOSIS:  Good. Dictated by:   [name], M.D. Attending Physician:  [name] DD:  10/16/01 TD:  10/17/01 Job: [id number]

## 2010-12-22 NOTE — Op Note (Signed)
   NAME:  CHAISE, MAHABIR NO.:  [id number]   MEDICAL RECORD NO.:  [id number]                   PATIENT TYPE:  AMB   LOCATION:  ENDO                                 FACILITY:  [facility]   PHYSICIAN:  Georgiana Spinner, M.D.                 DATE OF BIRTH:  08/02/1946   DATE OF PROCEDURE:  06/08/2003  DATE OF DISCHARGE:                                 OPERATIVE REPORT   PROCEDURE PERFORMED:  Colonoscopy.   ENDOSCOPIST:  Georgiana Spinner, M.D.   INDICATIONS FOR PROCEDURE:  Abdominal pain, colon cancer screening.   ANESTHESIA:  Demerol 20 mg, Versed 2 mg.   DESCRIPTION OF PROCEDURE:  With the patient mildly sedated in the left  lateral decubitus position, a rectal examination was performed which was  unremarkable. Subsequently, the Olympus video colonoscope was inserted in  the rectum and passed under direct vision to the cecum, identified by the  ileocecal valve and appendiceal orifice.  We entered into the terminal ileum  which also appeared normal and was photographed.  From this point the  colonoscope was slowly withdrawn taking circumferential views of the  terminal ileum and colonic mucosa as we pulled back all the way to the  rectum which appeared normal on direct and retroflex view.  The endoscope  was straightened and withdrawn.  The patient's vital signs and pulse  oximeter remained stable.  The patient tolerated the procedure well without  apparent complications.   FINDINGS:  Unremarkable examination including the terminal ileum.   PLAN:  CT scan of abdomen.  The patient will follow up with me as an  outpatient.                                               Georgiana Spinner, M.D.    GMO/MEDQ  D:  06/08/2003  T:  06/08/2003  Job:  [id number]

## 2010-12-22 NOTE — Discharge Summary (Signed)
[place]  Patient:    [name], [name] Visit Number: [id number] MRN: [id number]          Service Type: SUR Location: 3W 2725 01 Attending Physician:  Evlyn Clines Dictated by:   Soyla Murphy. Renne Crigler, M.D. Admit Date:  09/25/2001 Discharge Date: 09/28/2001                             Discharge Summary  LABORATORY DATA:  CBC normal.  PT, PTT normal.  Initial potassium 3.1, repeat was 3.8.  Otherwise electrolytes BUN and creatinine were normal.  CK, MBs and troponins were all in normal range.  CHEST X-RAY:  Mild left basilar atelectasis.  No active lung disease.  EKG normal sinus rhythm.  Normal EKG on the August 29, 2001, sinus bradycardia on August 28, 2001, with mild ST abnormality.  HOSPITAL COURSE:  Please see admission history and physical for details of his presentation.  Briefly he had atypical chest pain.  He was admitted to telemetry and ruled out for myocardial infarction.  Nitroglycerin was given to lower his blood pressure.  Cardiology consultation was obtained with Dr. Lucas Mallow who felt that he could be successfully worked up as outpatient for the ischemia.  He had no arrhythmias and blood pressure was under better control by time of discharge.  He was discharged on August 29, 2001.  IMPRESSION: 1. Chest pain. 2. Hypokalemia - treated. 3. Hypertension. 4. Intolerance to PENICILLIN and CODEINE. 5. History of gout. 6. History of prostate cancer followed by Dr. Annabell Howells. 7. History of peripheral neuropathy of unknown cause. 8. History of benign palpitations. 9. History of diverticulosis.  DISPOSITION:  He is discharged to home on the following medications: 1. Accupril 20 mg 2 p.o. q.a.m. 2. Atacand 32/12.5 mg daily. 3. Lidoderm patch as needed. 4. Verapamil SR 360 mg daily. 5. Toprol XL 100 mg twice a day. 6. Lamictal 200 mg twice a day. 7. Methadone 5 mg p.o. every 8 hours as needed for leg pain. 8. Kay Ciel 10 mEq  p.o. every Monday, Wednesday, Friday.  ACTIVITY:  No strenuous exercise before returning to see me next Tuesday.  DIET:  Low sodium diet.  CONDITION:  Improved. Dictated by:   Soyla Murphy. Renne Crigler, M.D. Attending Physician:  Evlyn Clines DD:  10/03/01 TD:  10/03/01 Job: [id number]

## 2010-12-22 NOTE — Op Note (Signed)
[facility]  Patient:    [name], [name] Visit Number: [id number] MRN: [id number]          Service Type: MED Location: 2000 2023 01 Attending Physician:  Armandina Gemma Dictated by:   Excell Seltzer. Annabell Howells, M.D. Proc. Date: 09/25/01 Admit Date:  08/28/2001 Discharge Date: 08/29/2001   CC:         Zollie Beckers D. Renne Crigler, M.D.   Operative Report  PROCEDURE:  Radical retropubic prostatectomy with pelvic lymphadenectomy.  PREOPERATIVE DIAGNOSIS:  Prostate cancer stage T1c Gleason 7.  POSTOPERATIVE DIAGNOSIS:  Prostate cancer stage T1c Gleason 7.  SURGEON:  Excell Seltzer. Annabell Howells, M.D.  ASSISTANT:  Veverly Fells. Vernie Ammons, M.D.  ANESTHESIA:  General.  SPECIMEN:  Prostate, seminal vesicles, right and left pelvic lymph nodes.  DRAIN:  22-French Foley catheter and #10 flat Blake drain.  COMPLICATIONS:  None.  ESTIMATED BLOOD LOSS:  500 cc.  FINDINGS AND PROCEDURE:  The patient was given IV Cipro. He was taken to the operating room and was placed in the supine position. He was fitted with thigh TED and PAS hose. A general anesthetic was induced. The table was then flexed and he was placed in the mild Trendelenburg position to flatten the abdomen. His abdomen was then shaved. He was prepped with Betadine solution and draped in the usual sterile fashion. A Foley catheter was inserted to drain the bladder and a lower midline incision was then made with the knife and incision was carried down through the subcutaneous tissues and anterior rectus fascia. The rectus muscle was parted in the midline. The transversalis fascia was opened and the right and left pelvic fossas were exposed. A Bookwalter retractor was then placed for exposure. The right pelvic lymphadenectomy was performed with the limits of the dissection being the external iliac vein, circumflex iliac vein, the obturator nerve, and the bifurcation of the iliac artery. A 2-0 silk tie was used to control the proximal  lymphatic channels. Large and small clips were then used to control additional vascular and lymphatic channels. Great care was taken to avoid the obturator nerve. Once the pack was removed, inspection revealed no obvious lymphadenopathy but he did have fairly significant fatty infiltration. The left lymph node dissection was then performed in a similar fashion without complications. No suture was used but large and small clips were used for control of vascular and lymphatic channels. Once lymphadenectomy was performed, retractors were repositioned, the bladder was drained, and the prostatectomy was performed.  The endopelvic fascia was opened with the tips of the Metzenbaums on each side and blunt dissection was used to free up the lateral aspects of the prostate from the pelvic sidewall. An Allis clamp was then used to grab the cut edges of the endopelvic fascia, bunching it up at the top of the bladder. A figure-of-eight 2-0 Vicryl stitch was placed at the bladder neck to control back bleeding. The Hohenfellner retractor was placed beneath the dorsal vein complex anterior to the urethra and a #1 silk tie was placed around the dorsal vein complex and tied. The dorsal vein complex was then divided using the Bovie. The urethra was exposed and encircled and encircled with a right angle clamp and a moistened umbilical tape was then placed beneath the urethra. Scissors were then used to divide the anterior urethra. The Foley catheter was then drawn into the field, clamped, and cut, and used to provide cephalad traction on the prostate. Posterior wall of the urethra was then divided and the  rectourethralis muscles were then taken down to expose the Denonvilliers fascia. The lateral pedicles were then taken down in small packets using Hem-o-lok clips allowing cephalad reflection of the prostate. Once the prostate had been reflected up to the point where the seminal vesicles and ampulla vas were  exposed, we turned our attention to the bladder neck. However, prior to proceeding, at this point a figure-of-eight 2-0 Vicryl stitch was placed in the dorsal vein complex for some minor bleeding in that location. The bladder neck was then grasped between Allis clamps. A Bovie was used to divide the anterior aspect of the bladder neck. A Vanderbilt clamp was then used to dissect the bladder neck fibers from the prostate. The Bovie was used to divide these fibers. Once the urethra had been completely encircled, it was divided at the bladder neck and the Foley catheter was then removed from the bladder after draining the balloon and used to provide traction on the prostate. The posterior wall of the bladder neck was then divided as well. Inspection revealed the ureteral orifices well away from the bladder neck. Some final posterior fibromuscular attachments to the prostate to the bladder neck were taken down using the Bovie and a right angle clamp. The ampullae of the vas were dissected out and divided with a large clip placed proximally on each. The seminal vesicles were then dissected out, clips were placed across the tips and the seminal vesicles were divided. The specimen was then handed off the field. Inspection of the pelvic fossa revealed a couple of small bleeders. These were controlled with cautery.  At this point in the procedure, there was transient decline in the blood pressure. It had dropped down as low as 63 but was quickly brought back up to 128 systolic with addition of fluid and intervention by anesthesia. Blood loss at this point had not been terribly significant.  Basically, the bladder neck mucosa was everted with interrupted 4-0 chromic stitches and the bladder neck was closed in a tennis racket fashion with a 2-0 chromic stitch. At this point, once hemostasis had been achieved, a fresh Foley catheter was inserted and 2-0 Vicryl anastomotic sutures were placed at 2, 5,  7, and 10 oclock. A #1 Prolene was then placed through the os of the  Foley catheter, tied, and then using the Hollenfellner retractor, the suture was brought through the bladder neck out the anterior wall of the bladder and the Foley was placed within the bladder. The balloon was filled with 15 cc of sterile fluid and the final anastomotic suture was placed at 12 oclock. With the placement of the 12 oclock suture, the bladder neck was still slightly patulous. I placed a horizontal mattress before bringing the suture back from the anterior wall out through the bladder neck and then through the urethral stump at 12 oclock. This provided further tightening of the bladder neck. At this point, the retractors were loosened. The bladder neck was reapproximated to the urethral stump and light traction was placed using the Foley balloon. The anastomotic sutures were then tightened and tied and the long ends were trimmed.  At this point, the anastomosis was checked and was found to be watertight. The #1 Prolene suture was brought through the right anterior abdominal wall. A #10 flat fully fluted Blake drain was placed through a separate stab wound on the left abdominal wall lateral to the incision. The drain was draped over the anastomotic area and into both pelvic fossae. It was secured to  the skin with a 2-0 silk suture. The wound was irrigated. The anterior rectus fascia was closed with a running #1 PDS. The subcutaneous tissue was irrigated. The skin was closed with clips. The Foley was placed on light traction and placed to straight drainage. A dressing was applied to the wound. The patient was taken out of the flexed position, his anesthetic was reversed, and he was moved to the recovery room in stable condition. There were no complications. Sponge, instrument, and needle counts were correct. Dictated by:   Excell Seltzer. Annabell Howells, M.D. Attending Physician:  Armandina Gemma DD:  09/25/01 TD:   09/25/01 Job: [id number]

## 2011-01-19 ENCOUNTER — Emergency Department (INDEPENDENT_AMBULATORY_CARE_PROVIDER_SITE_OTHER): Payer: Medicare Other

## 2011-01-19 ENCOUNTER — Inpatient Hospital Stay (HOSPITAL_COMMUNITY)
Admission: AD | Admit: 2011-01-19 | Discharge: 2011-01-30 | DRG: 193 | Disposition: A | Discharge status: Home Health | Payer: Medicare Other | Source: Other Acute Inpatient Hospital | Attending: Internal Medicine | Admitting: Internal Medicine

## 2011-01-19 ENCOUNTER — Emergency Department (HOSPITAL_BASED_OUTPATIENT_CLINIC_OR_DEPARTMENT_OTHER)
Admission: EM | Admit: 2011-01-19 | Discharge: 2011-01-19 | Disposition: A | Discharge status: Other Acute Inpatient Hospital | Payer: Medicare Other | Source: Home / Self Care | Attending: Emergency Medicine | Admitting: Emergency Medicine

## 2011-01-19 DIAGNOSIS — G589 Mononeuropathy, unspecified: Secondary | ICD-10-CM | POA: Diagnosis present

## 2011-01-19 DIAGNOSIS — E860 Dehydration: Secondary | ICD-10-CM | POA: Diagnosis present

## 2011-01-19 DIAGNOSIS — A419 Sepsis, unspecified organism: Secondary | ICD-10-CM | POA: Diagnosis not present

## 2011-01-19 DIAGNOSIS — Z8546 Personal history of malignant neoplasm of prostate: Secondary | ICD-10-CM

## 2011-01-19 DIAGNOSIS — M6282 Rhabdomyolysis: Secondary | ICD-10-CM | POA: Diagnosis present

## 2011-01-19 DIAGNOSIS — J9600 Acute respiratory failure, unspecified whether with hypoxia or hypercapnia: Secondary | ICD-10-CM | POA: Diagnosis not present

## 2011-01-19 DIAGNOSIS — R5381 Other malaise: Secondary | ICD-10-CM

## 2011-01-19 DIAGNOSIS — G609 Hereditary and idiopathic neuropathy, unspecified: Secondary | ICD-10-CM | POA: Diagnosis present

## 2011-01-19 DIAGNOSIS — J120 Adenoviral pneumonia: Principal | ICD-10-CM | POA: Diagnosis present

## 2011-01-19 DIAGNOSIS — D696 Thrombocytopenia, unspecified: Secondary | ICD-10-CM | POA: Diagnosis present

## 2011-01-19 DIAGNOSIS — M109 Gout, unspecified: Secondary | ICD-10-CM | POA: Diagnosis present

## 2011-01-19 DIAGNOSIS — R42 Dizziness and giddiness: Secondary | ICD-10-CM

## 2011-01-19 DIAGNOSIS — N179 Acute kidney failure, unspecified: Secondary | ICD-10-CM | POA: Diagnosis present

## 2011-01-19 DIAGNOSIS — I10 Essential (primary) hypertension: Secondary | ICD-10-CM | POA: Diagnosis present

## 2011-01-19 DIAGNOSIS — N390 Urinary tract infection, site not specified: Secondary | ICD-10-CM | POA: Diagnosis present

## 2011-01-19 DIAGNOSIS — E46 Unspecified protein-calorie malnutrition: Secondary | ICD-10-CM | POA: Diagnosis not present

## 2011-01-19 DIAGNOSIS — G4733 Obstructive sleep apnea (adult) (pediatric): Secondary | ICD-10-CM | POA: Diagnosis present

## 2011-01-19 DIAGNOSIS — E785 Hyperlipidemia, unspecified: Secondary | ICD-10-CM | POA: Diagnosis present

## 2011-01-19 DIAGNOSIS — R0602 Shortness of breath: Secondary | ICD-10-CM

## 2011-01-19 LAB — COMPREHENSIVE METABOLIC PANEL
ALT: 12 U/L (ref 0–53)
BUN: 31 mg/dL — ABNORMAL HIGH (ref 6–23)
Calcium: 9.6 mg/dL (ref 8.4–10.5)
Chloride: 97 mEq/L (ref 96–112)
Creatinine, Ser: 1.5 mg/dL — ABNORMAL HIGH (ref 0.50–1.35)
GFR calc non Af Amer: 47 mL/min — ABNORMAL LOW (ref >60)
Potassium: 4 mEq/L (ref 3.5–5.1)
Sodium: 136 mEq/L (ref 135–145)
Total Bilirubin: 0.6 mg/dL (ref 0.3–1.2)
Total Protein: 7.1 g/dL (ref 6.0–8.3)

## 2011-01-19 LAB — URINALYSIS, ROUTINE W REFLEX MICROSCOPIC
Leukocytes, UA: NEGATIVE
Nitrite: NEGATIVE
Specific Gravity, Urine: 1.025 (ref 1.005–1.030)
Urobilinogen, UA: 1 mg/dL (ref 0.0–1.0)

## 2011-01-19 LAB — CBC
MCH: 29.9 pg (ref 26.0–34.0)
Platelets: 117 10*3/uL — ABNORMAL LOW (ref 150–400)
RDW: 14.5 % (ref 11.5–15.5)

## 2011-01-19 LAB — PROCALCITONIN: Procalcitonin: <0.1 ng/mL

## 2011-01-19 LAB — URINE MICROSCOPIC-ADD ON

## 2011-01-19 LAB — LACTIC ACID, PLASMA: Lactic Acid, Venous: 0.9 mmol/L (ref 0.5–2.2)

## 2011-01-20 ENCOUNTER — Inpatient Hospital Stay (HOSPITAL_COMMUNITY): Payer: Medicare Other

## 2011-01-20 LAB — LIPID PANEL
Cholesterol: 126 mg/dL (ref 0–200)
LDL Cholesterol: 63 mg/dL (ref 0–99)
Total CHOL/HDL Ratio: 2.5 RATIO
Triglycerides: 60 mg/dL (ref <150)
VLDL: 12 mg/dL (ref 0–40)

## 2011-01-20 LAB — MAGNESIUM: Magnesium: 2.3 mg/dL (ref 1.5–2.5)

## 2011-01-20 LAB — DIFFERENTIAL
Basophils Absolute: 0 10*3/uL (ref 0.0–0.1)
Basophils Relative: 1 % (ref 0–1)
Eosinophils Absolute: 0 10*3/uL (ref 0.0–0.7)
Eosinophils Relative: 0 % (ref 0–5)
Monocytes Absolute: 0.4 10*3/uL (ref 0.1–1.0)
Monocytes Relative: 6 % (ref 3–12)
Neutro Abs: 5 10*3/uL (ref 1.7–7.7)

## 2011-01-20 LAB — COMPREHENSIVE METABOLIC PANEL
Albumin: 3.6 g/dL (ref 3.5–5.2)
CO2: 27 mEq/L (ref 19–32)
Calcium: 8.4 mg/dL (ref 8.4–10.5)
GFR calc Af Amer: >60 mL/min (ref >60)
GFR calc non Af Amer: 58 mL/min — ABNORMAL LOW (ref >60)
Total Protein: 6.3 g/dL (ref 6.0–8.3)

## 2011-01-20 LAB — CREATININE, URINE, RANDOM: Creatinine, Urine: 326.86 mg/dL

## 2011-01-20 LAB — URINE CULTURE

## 2011-01-20 LAB — CBC
HCT: 34.8 % — ABNORMAL LOW (ref 39.0–52.0)
MCH: 30.3 pg (ref 26.0–34.0)
MCV: 87.2 fL (ref 78.0–100.0)
RBC: 3.99 MIL/uL — ABNORMAL LOW (ref 4.22–5.81)

## 2011-01-20 LAB — CK: Total CK: 417 U/L — ABNORMAL HIGH (ref 7–232)

## 2011-01-20 LAB — T4, FREE: Free T4: 1.22 ng/dL (ref 0.80–1.80)

## 2011-01-21 ENCOUNTER — Inpatient Hospital Stay (HOSPITAL_COMMUNITY): Payer: Medicare Other

## 2011-01-21 DIAGNOSIS — J9600 Acute respiratory failure, unspecified whether with hypoxia or hypercapnia: Secondary | ICD-10-CM

## 2011-01-21 LAB — DIFFERENTIAL
Eosinophils Relative: 0 % (ref 0–5)
Lymphocytes Relative: 11 % — ABNORMAL LOW (ref 12–46)
Lymphs Abs: 0.4 10*3/uL — ABNORMAL LOW (ref 0.7–4.0)
Monocytes Absolute: 0.2 10*3/uL (ref 0.1–1.0)

## 2011-01-21 LAB — BLOOD GAS, ARTERIAL
Bicarbonate: 24.5 mEq/L — ABNORMAL HIGH (ref 20.0–24.0)
TCO2: 25.9 mmol/L (ref 0–100)
pCO2 arterial: 50 mmHg — ABNORMAL HIGH (ref 35.0–45.0)
pH, Arterial: 7.319 — ABNORMAL LOW (ref 7.350–7.450)
pO2, Arterial: 87.3 mmHg (ref 80.0–100.0)

## 2011-01-21 LAB — BASIC METABOLIC PANEL
BUN: 25 mg/dL — ABNORMAL HIGH (ref 6–23)
Chloride: 104 mEq/L (ref 96–112)
Creatinine, Ser: 1.19 mg/dL (ref 0.50–1.35)
GFR calc Af Amer: >60 mL/min (ref >60)

## 2011-01-21 LAB — GLUCOSE, CAPILLARY: Glucose-Capillary: 119 mg/dL — ABNORMAL HIGH (ref 70–99)

## 2011-01-21 LAB — CBC
HCT: 31.1 % — ABNORMAL LOW (ref 39.0–52.0)
MCV: 87.9 fL (ref 78.0–100.0)
RDW: 14.7 % (ref 11.5–15.5)
WBC: 3.8 10*3/uL — ABNORMAL LOW (ref 4.0–10.5)

## 2011-01-21 LAB — EXPECTORATED SPUTUM ASSESSMENT W GRAM STAIN, RFLX TO RESP C

## 2011-01-21 LAB — LACTIC ACID, PLASMA: Lactic Acid, Venous: 1.3 mmol/L (ref 0.5–2.2)

## 2011-01-21 LAB — PROCALCITONIN: Procalcitonin: 0.18 ng/mL

## 2011-01-21 LAB — MRSA PCR SCREENING: MRSA by PCR: NEGATIVE

## 2011-01-21 MED ORDER — GADOBENATE DIMEGLUMINE 529 MG/ML IV SOLN
18.0000 mL | Freq: Once | INTRAVENOUS | Status: AC | PRN
  Administered 2011-01-21: 18 mL via INTRAVENOUS

## 2011-01-22 DIAGNOSIS — R4182 Altered mental status, unspecified: Secondary | ICD-10-CM

## 2011-01-22 DIAGNOSIS — J9600 Acute respiratory failure, unspecified whether with hypoxia or hypercapnia: Secondary | ICD-10-CM

## 2011-01-22 DIAGNOSIS — R509 Fever, unspecified: Secondary | ICD-10-CM

## 2011-01-22 LAB — CBC
HCT: 30.2 % — ABNORMAL LOW (ref 39.0–52.0)
Hemoglobin: 10.4 g/dL — ABNORMAL LOW (ref 13.0–17.0)
MCH: 29.9 pg (ref 26.0–34.0)
MCHC: 34.4 g/dL (ref 30.0–36.0)

## 2011-01-22 LAB — URINE MICROSCOPIC-ADD ON

## 2011-01-22 LAB — URINALYSIS, ROUTINE W REFLEX MICROSCOPIC
Bilirubin Urine: NEGATIVE
Glucose, UA: NEGATIVE mg/dL
Ketones, ur: 15 mg/dL — AB
Leukocytes, UA: NEGATIVE
Protein, ur: 100 mg/dL — AB

## 2011-01-22 LAB — BASIC METABOLIC PANEL
BUN: 22 mg/dL (ref 6–23)
Calcium: 7.5 mg/dL — ABNORMAL LOW (ref 8.4–10.5)
GFR calc Af Amer: >60 mL/min (ref >60)
GFR calc non Af Amer: >60 mL/min (ref >60)
Potassium: 3.9 mEq/L (ref 3.5–5.1)
Sodium: 138 mEq/L (ref 135–145)

## 2011-01-22 LAB — DIC (DISSEMINATED INTRAVASCULAR COAGULATION)PANEL
D-Dimer, Quant: 1.49 ug/mL-FEU — ABNORMAL HIGH (ref 0.00–0.48)
Fibrinogen: 668 mg/dL — ABNORMAL HIGH (ref 204–475)
Platelets: 84 10*3/uL — ABNORMAL LOW (ref 150–400)

## 2011-01-23 ENCOUNTER — Inpatient Hospital Stay (HOSPITAL_COMMUNITY): Payer: Medicare Other

## 2011-01-23 DIAGNOSIS — R4182 Altered mental status, unspecified: Secondary | ICD-10-CM

## 2011-01-23 DIAGNOSIS — J9600 Acute respiratory failure, unspecified whether with hypoxia or hypercapnia: Secondary | ICD-10-CM

## 2011-01-23 DIAGNOSIS — R509 Fever, unspecified: Secondary | ICD-10-CM

## 2011-01-23 LAB — MAGNESIUM: Magnesium: 2.4 mg/dL (ref 1.5–2.5)

## 2011-01-23 LAB — BASIC METABOLIC PANEL
BUN: 22 mg/dL (ref 6–23)
Creatinine, Ser: 1.05 mg/dL (ref 0.50–1.35)
GFR calc Af Amer: >60 mL/min (ref >60)
GFR calc non Af Amer: >60 mL/min (ref >60)

## 2011-01-23 LAB — HEPATIC FUNCTION PANEL
ALT: 26 U/L (ref 0–53)
AST: 51 U/L — ABNORMAL HIGH (ref 0–37)
Bilirubin, Direct: 0.2 mg/dL (ref 0.0–0.3)
Indirect Bilirubin: 0.3 mg/dL (ref 0.3–0.9)
Total Bilirubin: 0.5 mg/dL (ref 0.3–1.2)

## 2011-01-23 LAB — CBC
HCT: 35.2 % — ABNORMAL LOW (ref 39.0–52.0)
MCH: 29.7 pg (ref 26.0–34.0)
MCV: 87.1 fL (ref 78.0–100.0)
RBC: 4.04 MIL/uL — ABNORMAL LOW (ref 4.22–5.81)
RDW: 15 % (ref 11.5–15.5)
WBC: 3.8 10*3/uL — ABNORMAL LOW (ref 4.0–10.5)

## 2011-01-23 LAB — LEGIONELLA ANTIGEN, URINE: Legionella Antigen, Urine: NEGATIVE

## 2011-01-23 LAB — EHRLICHIA ANTIBODY PANEL: E chaffeensis (HGE) Ab, IgM: NEGATIVE

## 2011-01-24 ENCOUNTER — Inpatient Hospital Stay (HOSPITAL_COMMUNITY): Payer: Medicare Other

## 2011-01-24 ENCOUNTER — Other Ambulatory Visit: Payer: Self-pay | Admitting: Pulmonary Disease

## 2011-01-24 DIAGNOSIS — J90 Pleural effusion, not elsewhere classified: Secondary | ICD-10-CM

## 2011-01-24 DIAGNOSIS — J189 Pneumonia, unspecified organism: Secondary | ICD-10-CM

## 2011-01-24 LAB — BASIC METABOLIC PANEL
Chloride: 102 mEq/L (ref 96–112)
GFR calc non Af Amer: >60 mL/min (ref >60)
Glucose, Bld: 111 mg/dL — ABNORMAL HIGH (ref 70–99)
Potassium: 3.2 mEq/L — ABNORMAL LOW (ref 3.5–5.1)
Sodium: 140 mEq/L (ref 135–145)

## 2011-01-24 LAB — BODY FLUID CELL COUNT WITH DIFFERENTIAL
Eos, Fluid: 0 %
Monocyte-Macrophage-Serous Fluid: 25 % — ABNORMAL LOW (ref 50–90)
Neutrophil Count, Fluid: 8 % (ref 0–25)
Total Nucleated Cell Count, Fluid: 1275 cu mm — ABNORMAL HIGH (ref 0–1000)

## 2011-01-24 LAB — CBC
Hemoglobin: 11.6 g/dL — ABNORMAL LOW (ref 13.0–17.0)
MCHC: 34.8 g/dL (ref 30.0–36.0)
RBC: 3.87 MIL/uL — ABNORMAL LOW (ref 4.22–5.81)
WBC: 4.6 10*3/uL (ref 4.0–10.5)

## 2011-01-24 LAB — LACTATE DEHYDROGENASE, PLEURAL OR PERITONEAL FLUID

## 2011-01-24 LAB — CULTURE, RESPIRATORY W GRAM STAIN

## 2011-01-24 LAB — VANCOMYCIN, TROUGH: Vancomycin Tr: 13.5 ug/mL (ref 10.0–20.0)

## 2011-01-24 LAB — GLUCOSE, SEROUS FLUID: Glucose, Fluid: 125 mg/dL

## 2011-01-24 LAB — PROTEIN, BODY FLUID

## 2011-01-24 MED ORDER — IOHEXOL 300 MG/ML  SOLN
75.0000 mL | Freq: Once | INTRAMUSCULAR | Status: AC | PRN
  Administered 2011-01-24: 75 mL via INTRAVENOUS

## 2011-01-25 LAB — BASIC METABOLIC PANEL
CO2: 29 mEq/L (ref 19–32)
GFR calc non Af Amer: >60 mL/min (ref >60)
Glucose, Bld: 119 mg/dL — ABNORMAL HIGH (ref 70–99)
Potassium: 3.3 mEq/L — ABNORMAL LOW (ref 3.5–5.1)
Sodium: 141 mEq/L (ref 135–145)

## 2011-01-25 LAB — CBC
MCH: 30 pg (ref 26.0–34.0)
MCHC: 35.5 g/dL (ref 30.0–36.0)
Platelets: 142 10*3/uL — ABNORMAL LOW (ref 150–400)
RBC: 3.93 MIL/uL — ABNORMAL LOW (ref 4.22–5.81)

## 2011-01-25 LAB — PATHOLOGIST SMEAR REVIEW: Tech Review: REACTIVE

## 2011-01-25 LAB — MISCELLANEOUS TEST

## 2011-01-26 ENCOUNTER — Inpatient Hospital Stay (HOSPITAL_COMMUNITY): Payer: Medicare Other

## 2011-01-26 LAB — CULTURE, BLOOD (ROUTINE X 2)
Culture  Setup Time: 201206160141
Culture: NO GROWTH

## 2011-01-26 LAB — BASIC METABOLIC PANEL
CO2: 33 mEq/L — ABNORMAL HIGH (ref 19–32)
Calcium: 8.1 mg/dL — ABNORMAL LOW (ref 8.4–10.5)
Chloride: 102 mEq/L (ref 96–112)
Glucose, Bld: 126 mg/dL — ABNORMAL HIGH (ref 70–99)
Potassium: 3.6 mEq/L (ref 3.5–5.1)
Sodium: 140 mEq/L (ref 135–145)

## 2011-01-27 LAB — BASIC METABOLIC PANEL
BUN: 14 mg/dL (ref 6–23)
CO2: 36 mEq/L — ABNORMAL HIGH (ref 19–32)
Chloride: 97 mEq/L (ref 96–112)
Glucose, Bld: 112 mg/dL — ABNORMAL HIGH (ref 70–99)
Potassium: 3.4 mEq/L — ABNORMAL LOW (ref 3.5–5.1)
Sodium: 141 mEq/L (ref 135–145)

## 2011-01-28 LAB — BASIC METABOLIC PANEL
BUN: 14 mg/dL (ref 6–23)
CO2: 36 mEq/L — ABNORMAL HIGH (ref 19–32)
Chloride: 100 mEq/L (ref 96–112)
Glucose, Bld: 107 mg/dL — ABNORMAL HIGH (ref 70–99)
Potassium: 4.4 mEq/L (ref 3.5–5.1)
Sodium: 141 mEq/L (ref 135–145)

## 2011-01-28 LAB — BODY FLUID CULTURE

## 2011-01-29 ENCOUNTER — Inpatient Hospital Stay (HOSPITAL_COMMUNITY): Payer: Medicare Other

## 2011-01-29 DIAGNOSIS — R0902 Hypoxemia: Secondary | ICD-10-CM

## 2011-01-29 DIAGNOSIS — J9600 Acute respiratory failure, unspecified whether with hypoxia or hypercapnia: Secondary | ICD-10-CM

## 2011-01-29 DIAGNOSIS — J120 Adenoviral pneumonia: Secondary | ICD-10-CM

## 2011-01-29 DIAGNOSIS — R509 Fever, unspecified: Secondary | ICD-10-CM

## 2011-01-29 LAB — BASIC METABOLIC PANEL
BUN: 15 mg/dL (ref 6–23)
Chloride: 100 mEq/L (ref 96–112)
Glucose, Bld: 99 mg/dL (ref 70–99)
Potassium: 3.3 mEq/L — ABNORMAL LOW (ref 3.5–5.1)
Sodium: 141 mEq/L (ref 135–145)

## 2011-01-31 LAB — MISCELLANEOUS TEST

## 2011-02-03 NOTE — H&P (Signed)
NAMESHEM, PLEMMONS NO.:  [id number]  MEDICAL RECORD NO.:  [id number]  LOCATION:  5523                         FACILITY:  [facility]  PHYSICIAN:  Rock Nephew, MD       DATE OF BIRTH:  1946-02-11  DATE OF ADMISSION:  01/19/2011 DATE OF DISCHARGE:                             HISTORY & PHYSICAL   PRIMARY CARE PHYSICIAN:  Soyla Murphy. Renne Crigler, MD  CHIEF COMPLAINT:  Fever, weakness.  HISTORY OF PRESENT ILLNESS:  This is a 65 year old male with a past medical history of hypertension, gout, peripheral neuropathy, history of prostate cancer status post radical prostatectomy comes in with a chief complaint of fever and weakness.  The patient was in his usual state of health till about 2 days ago.  The patient was getting progressively weak and he has not been walking as well as usual.  The patient came to the [facility] ED.  Over there the patient was discovered to have a temperature of 103 degrees Fahrenheit.  The patient's workup over there including had a chest x-ray which shows stable bibasilar scarring but with no active disease.  The patient had no leukocytosis.  The patient's urine had many bacteria per high-power field.  The patient's creatinine was 1.50.  The patient's last creatinine was 0.92 in November 2008.  We were asked to admit this patient for fever, UTI, weakness, and acute renal failure.  Also the patient's mental status currently is alert, awake, oriented x3 and he is maintaining without difficulty.  He also has no neck stiffness, he has no chest pain, and he denies any shortness of breath.  He does report he is having a mild dry cough.  PAST MEDICAL HISTORY:  History of hypertension, gout, peripheral neuropathy, history of prostate cancer status post radical prostatectomy.  FAMILY HISTORY:  Myocardial infarction, has emphysema.  SOCIAL HISTORY:  The patient is nonsmoker, nondrinker.  No drug abuse. The patient lives at home.  He is a  retired Optician, dispensing.  ALLERGIES:  PENICILLIN and SULFA.  Apparently the patient has some swelling on his fingers when he took penicillin at the age of 59.  REVIEW OF SYSTEMS:  He denies any headache, any blurry vision.  He denies any chest pain, any shortness of breath.  He reports some nausea earlier, but he does not have any nausea and now he does not have any vomiting.  He does not have any abdominal pain.  He does not have any constipation, any diarrhea. He denies any burning on urination and he denies any pain in his legs.  He is alert, awake, and oriented x3 and moves all of his extremities without difficulty.  HOME MEDICATIONS:  Currently, the patient is not clear what his medications are, but he is taking Lamictal, allopurinol, Benicar and hydrochlorothiazide combination pill.  He is taking atenolol, Ambien, simvastatin and lamotrigine.  We are awaiting pharmacy to finalize his medications.  PHYSICAL EXAMINATION:  VITAL SIGNS:  Currently have been temperature 99.5, blood pressure 1170/60, pulse rate is 70, 92% saturation on 2 L nasal cannula.  However the patient had readings of low blood pressure 85/40 at the [facility]. HEAD, EYES, EARS, NOSE  AND THROAT:  Normocephalic, atraumatic.  Pupils equally round and reactive to light. NECK:  No neck stiffness. CARDIOVASCULAR:  S1 and S2.  Regular rate and rhythm.  No murmurs.  No rubs. LUNGS:  Clear to auscultation bilaterally.  No wheezes.  No rhonchi. ABDOMEN:  Soft, nontender, nondistended.  Bowel sounds are positive.  No guarding.  No rebound tenderness.  He has no costovertebral angle tenderness. EXTREMITIES:  There is no lower extremity edema. NEURO:  He is alert, awake, and oriented x3.  He can move all of his extremities without difficulty.  I do not have a 12-lead EKG with me.  RADIOLOGICAL STUDIES:  The patient's two-view chest x-ray shows stable bibasilar scarring.  No active disease.  The patient's  laboratory studies are as follows.  WBC count 6.5, hemoglobin is 12.2, hematocrit is 35.7, MCV is 87.5, platelets are 117, baseline platelets were normal. Sodium 136, potassium 4.0, chloride 97, bicarbonate 28, BUN 31, creatinine 1.50, glucose 124, total bili 0.6, alk phos 59, AST 21, ALT 12, total protein 7.1, albumin 4.6, calcium 9.6, lactic acid 0.9.  Urine specific gravity 1.025, small blood, 30 protein, negative leukocyte esterase, negative nitrites.  However, there is many bacteria per high- power field.  The patient's urine culture and blood cultures are pending.  IMPRESSION AND PLAN:  This is a 65 year old male with a history of multiple medical problems who was admitted to the hospital secondary to febrile illness from an urinary tract infection: 1. Febrile illness from urinary tract infection.  The patient has     already received ceftriaxone in the [facility] [place]  We will     continue the patient on ceftriaxone 1 g IV q.24.  We will await     urine cultures.  The patient also received blood cultures x2.  We     will also check his procalcitonin level.  The patient's lactic acid     is normal.  The patient will also be fluid hydrated with normal     saline of 125 mL an hour.  As far as history of hypertension, the     patient has had episodes of low blood pressure.  The patient is on     multiple antihypertensives.  Currently, we will withhold all the     antihypertensives except for the atenolol.  We will continue with     holding parameters to hold for systolic blood pressure less than     110, heart rate less than 55. 2. History of gout.  We will monitor the patient.  Allopurinol could     most likely be started in the near future. 3. History of peripheral neuropathy.  The patient takes Lamictal for     the peripheral neuropathy.  I will continue the patient on     Lamictal. 4. Hyperlipidemia.  The patient takes simvastatin.  Currently, I do     not have the  patient's dose of the simvastatin as well we will     continue the patient on 20 mg of simvastatin. 5. Acute renal failure.  The patient has acute renal failure.  This is     most likely secondary to dehydration.  The patient will be fluid     hydrated.  We will check a urine sodium, urine creatinine.  We will     also check a renal     ultrasound to evaluate for hydronephrosis. 6. Deep vein thrombosis prophylaxis.  The patient will be placed on  Lovenox.  Pharmacy can dose that.  CODE STATUS:  I did discuss code status with the patient and surprisingly the patient wanted to be a DNR.     Rock Nephew, MD     NH/MEDQ  D:  01/19/2011  T:  01/19/2011  Job:  [id number]  cc:   Soyla Murphy. Renne Crigler, M.D.  Electronically Signed by Rock Nephew MD on 02/03/2011 11:58:00 PM

## 2011-02-04 ENCOUNTER — Emergency Department (INDEPENDENT_AMBULATORY_CARE_PROVIDER_SITE_OTHER): Payer: Medicare Other

## 2011-02-04 ENCOUNTER — Emergency Department (HOSPITAL_BASED_OUTPATIENT_CLINIC_OR_DEPARTMENT_OTHER)
Admission: EM | Admit: 2011-02-04 | Discharge: 2011-02-05 | Disposition: A | Discharge status: Home / Self Care | Payer: Medicare Other | Source: Home / Self Care | Attending: Emergency Medicine | Admitting: Emergency Medicine

## 2011-02-04 DIAGNOSIS — R5381 Other malaise: Secondary | ICD-10-CM

## 2011-02-04 DIAGNOSIS — I10 Essential (primary) hypertension: Secondary | ICD-10-CM | POA: Insufficient documentation

## 2011-02-04 DIAGNOSIS — R42 Dizziness and giddiness: Secondary | ICD-10-CM

## 2011-02-04 DIAGNOSIS — Z79899 Other long term (current) drug therapy: Secondary | ICD-10-CM | POA: Insufficient documentation

## 2011-02-04 DIAGNOSIS — R5383 Other fatigue: Secondary | ICD-10-CM

## 2011-02-04 DIAGNOSIS — E860 Dehydration: Secondary | ICD-10-CM | POA: Insufficient documentation

## 2011-02-04 LAB — CBC
HCT: 34.7 % — ABNORMAL LOW (ref 39.0–52.0)
MCV: 85.3 fL (ref 78.0–100.0)
Platelets: 276 10*3/uL (ref 150–400)
RBC: 4.07 MIL/uL — ABNORMAL LOW (ref 4.22–5.81)
RDW: 13.9 % (ref 11.5–15.5)
WBC: 8.2 10*3/uL (ref 4.0–10.5)

## 2011-02-04 LAB — BASIC METABOLIC PANEL
CO2: 27 mEq/L (ref 19–32)
Chloride: 97 mEq/L (ref 96–112)
Creatinine, Ser: 1.3 mg/dL (ref 0.50–1.35)
GFR calc Af Amer: >60 mL/min (ref >60)
Potassium: 4 mEq/L (ref 3.5–5.1)

## 2011-02-04 LAB — DIFFERENTIAL
Basophils Absolute: 0 10*3/uL (ref 0.0–0.1)
Eosinophils Absolute: 0 10*3/uL (ref 0.0–0.7)
Eosinophils Relative: 1 % (ref 0–5)
Lymphocytes Relative: 18 % (ref 12–46)
Lymphs Abs: 1.5 10*3/uL (ref 0.7–4.0)
Neutrophils Relative %: 73 % (ref 43–77)

## 2011-02-05 LAB — COMPREHENSIVE METABOLIC PANEL
ALT: 22 U/L (ref 0–53)
AST: 25 U/L (ref 0–37)
Alkaline Phosphatase: 102 U/L (ref 39–117)
CO2: 26 mEq/L (ref 19–32)
Chloride: 96 mEq/L (ref 96–112)
GFR calc Af Amer: >60 mL/min (ref >60)
GFR calc non Af Amer: 55 mL/min — ABNORMAL LOW (ref >60)
Glucose, Bld: 134 mg/dL — ABNORMAL HIGH (ref 70–99)
Potassium: 4 mEq/L (ref 3.5–5.1)
Sodium: 136 mEq/L (ref 135–145)
Total Bilirubin: 0.6 mg/dL (ref 0.3–1.2)

## 2011-02-05 LAB — URINALYSIS, ROUTINE W REFLEX MICROSCOPIC
Bilirubin Urine: NEGATIVE
Hgb urine dipstick: NEGATIVE
Ketones, ur: NEGATIVE mg/dL
Nitrite: NEGATIVE
Protein, ur: NEGATIVE mg/dL
Specific Gravity, Urine: 1.015 (ref 1.005–1.030)
Urobilinogen, UA: 0.2 mg/dL (ref 0.0–1.0)

## 2011-02-05 LAB — CK TOTAL AND CKMB (NOT AT ARMC)
CK, MB: 3.9 ng/mL (ref 0.3–4.0)
Relative Index: INVALID (ref 0.0–2.5)

## 2011-02-06 ENCOUNTER — Emergency Department (HOSPITAL_BASED_OUTPATIENT_CLINIC_OR_DEPARTMENT_OTHER): Payer: Medicare Other

## 2011-02-06 ENCOUNTER — Inpatient Hospital Stay (HOSPITAL_COMMUNITY)
Admission: AD | Admit: 2011-02-06 | Discharge: 2011-02-12 | DRG: 194 | Disposition: A | Discharge status: Skilled Nursing Facility | Payer: Medicare Other | Source: Other Acute Inpatient Hospital | Attending: Internal Medicine | Admitting: Internal Medicine

## 2011-02-06 ENCOUNTER — Emergency Department (HOSPITAL_BASED_OUTPATIENT_CLINIC_OR_DEPARTMENT_OTHER)
Admission: EM | Admit: 2011-02-06 | Discharge: 2011-02-06 | Disposition: A | Discharge status: Nursing Home (Includes ICF) | Payer: Medicare Other | Source: Home / Self Care | Attending: Emergency Medicine | Admitting: Emergency Medicine

## 2011-02-06 ENCOUNTER — Emergency Department (INDEPENDENT_AMBULATORY_CARE_PROVIDER_SITE_OTHER): Payer: Medicare Other

## 2011-02-06 DIAGNOSIS — J189 Pneumonia, unspecified organism: Principal | ICD-10-CM | POA: Diagnosis present

## 2011-02-06 DIAGNOSIS — G609 Hereditary and idiopathic neuropathy, unspecified: Secondary | ICD-10-CM | POA: Diagnosis present

## 2011-02-06 DIAGNOSIS — Z79899 Other long term (current) drug therapy: Secondary | ICD-10-CM

## 2011-02-06 DIAGNOSIS — Z8546 Personal history of malignant neoplasm of prostate: Secondary | ICD-10-CM

## 2011-02-06 DIAGNOSIS — R42 Dizziness and giddiness: Secondary | ICD-10-CM | POA: Diagnosis present

## 2011-02-06 DIAGNOSIS — R918 Other nonspecific abnormal finding of lung field: Secondary | ICD-10-CM

## 2011-02-06 DIAGNOSIS — R0902 Hypoxemia: Secondary | ICD-10-CM | POA: Diagnosis present

## 2011-02-06 DIAGNOSIS — F329 Major depressive disorder, single episode, unspecified: Secondary | ICD-10-CM | POA: Diagnosis present

## 2011-02-06 DIAGNOSIS — N179 Acute kidney failure, unspecified: Secondary | ICD-10-CM | POA: Diagnosis present

## 2011-02-06 DIAGNOSIS — D631 Anemia in chronic kidney disease: Secondary | ICD-10-CM | POA: Diagnosis present

## 2011-02-06 DIAGNOSIS — E860 Dehydration: Secondary | ICD-10-CM | POA: Diagnosis present

## 2011-02-06 DIAGNOSIS — F3289 Other specified depressive episodes: Secondary | ICD-10-CM | POA: Diagnosis present

## 2011-02-06 DIAGNOSIS — I10 Essential (primary) hypertension: Secondary | ICD-10-CM | POA: Diagnosis present

## 2011-02-06 DIAGNOSIS — M109 Gout, unspecified: Secondary | ICD-10-CM | POA: Diagnosis present

## 2011-02-06 DIAGNOSIS — J90 Pleural effusion, not elsewhere classified: Secondary | ICD-10-CM | POA: Diagnosis present

## 2011-02-06 DIAGNOSIS — G4733 Obstructive sleep apnea (adult) (pediatric): Secondary | ICD-10-CM | POA: Diagnosis present

## 2011-02-06 DIAGNOSIS — R5381 Other malaise: Secondary | ICD-10-CM | POA: Diagnosis present

## 2011-02-06 DIAGNOSIS — I517 Cardiomegaly: Secondary | ICD-10-CM

## 2011-02-06 DIAGNOSIS — R5383 Other fatigue: Secondary | ICD-10-CM

## 2011-02-06 LAB — CBC
HCT: 36.3 % — ABNORMAL LOW (ref 39.0–52.0)
Hemoglobin: 12.4 g/dL — ABNORMAL LOW (ref 13.0–17.0)
RBC: 4.2 MIL/uL — ABNORMAL LOW (ref 4.22–5.81)
WBC: 6.7 10*3/uL (ref 4.0–10.5)

## 2011-02-06 LAB — URINALYSIS, ROUTINE W REFLEX MICROSCOPIC
Bilirubin Urine: NEGATIVE
Glucose, UA: NEGATIVE mg/dL
Leukocytes, UA: NEGATIVE
Nitrite: NEGATIVE
Specific Gravity, Urine: 1.011 (ref 1.005–1.030)
pH: 5 (ref 5.0–8.0)

## 2011-02-06 LAB — COMPREHENSIVE METABOLIC PANEL
ALT: 19 U/L (ref 0–53)
Albumin: 3.1 g/dL — ABNORMAL LOW (ref 3.5–5.2)
Calcium: 10.2 mg/dL (ref 8.4–10.5)
GFR calc Af Amer: 49 mL/min — ABNORMAL LOW (ref >60)
Glucose, Bld: 116 mg/dL — ABNORMAL HIGH (ref 70–99)
Potassium: 4.5 mEq/L (ref 3.5–5.1)
Sodium: 137 mEq/L (ref 135–145)
Total Protein: 7.8 g/dL (ref 6.0–8.3)

## 2011-02-06 LAB — DIFFERENTIAL
Basophils Absolute: 0.1 10*3/uL (ref 0.0–0.1)
Basophils Relative: 1 % (ref 0–1)
Lymphocytes Relative: 19 % (ref 12–46)
Neutro Abs: 5 10*3/uL (ref 1.7–7.7)
Neutrophils Relative %: 74 % (ref 43–77)

## 2011-02-06 LAB — TROPONIN I: Troponin I: <0.3 ng/mL (ref <0.30)

## 2011-02-06 LAB — CK TOTAL AND CKMB (NOT AT ARMC)
CK, MB: 4.2 ng/mL — ABNORMAL HIGH (ref 0.3–4.0)
Relative Index: INVALID (ref 0.0–2.5)
Total CK: 47 U/L (ref 7–232)

## 2011-02-07 ENCOUNTER — Inpatient Hospital Stay (HOSPITAL_COMMUNITY): Payer: Medicare Other

## 2011-02-07 LAB — PROTIME-INR
INR: 1.24 (ref 0.00–1.49)
Prothrombin Time: 15.9 seconds — ABNORMAL HIGH (ref 11.6–15.2)

## 2011-02-07 LAB — COMPREHENSIVE METABOLIC PANEL
ALT: 13 U/L (ref 0–53)
Albumin: 2.3 g/dL — ABNORMAL LOW (ref 3.5–5.2)
Alkaline Phosphatase: 75 U/L (ref 39–117)
Potassium: 4.3 mEq/L (ref 3.5–5.1)
Sodium: 139 mEq/L (ref 135–145)
Total Protein: 6.1 g/dL (ref 6.0–8.3)

## 2011-02-07 LAB — CBC
HCT: 31 % — ABNORMAL LOW (ref 39.0–52.0)
Hemoglobin: 10.2 g/dL — ABNORMAL LOW (ref 13.0–17.0)
MCH: 28.9 pg (ref 26.0–34.0)
RBC: 3.53 MIL/uL — ABNORMAL LOW (ref 4.22–5.81)

## 2011-02-07 NOTE — Discharge Summary (Signed)
[name] NO.:  [id number]  MEDICAL RECORD NO.:  [id number]  LOCATION:  2504                         FACILITY:  [facility]  PHYSICIAN:  Osvaldo Shipper, MD     DATE OF BIRTH:  [birth year]  DATE OF ADMISSION:  01/19/2011 DATE OF DISCHARGE:  01/30/2011                              DISCHARGE SUMMARY   PRIMARY CARE PHYSICIAN:  Soyla Murphy. Renne Crigler, MD  CONSULTATIONS DURING THIS HOSPITALIZATION: 1. Pulmonary Critical Care Medicine. 2. Infectious disease specialist.  IMAGING STUDIES DONE INCLUDE THE FOLLOWING: 1. Chest x-ray, June 15 showed stable bibasilar scarring without any     active disease. 2. Ultrasound of the kidneys showed no evidence for hydronephrosis. 3. X-ray of the chest repeated on June 16 showed once again bibasilar     opacities, likely atelectasis. 4. MRI of the brain showed no acute intracranial findings. 5. Subsequently, he had chest x-rays on June 17, which showed     pneumonia in the right lung base.  June 19, which showed bilateral     opacities.  Then on June 20, which showed no interval change.  June     20, repeated later that evening which showed no pneumothorax after     thoracentesis.  June 22, showed stable bilateral airspace disease.     June 25, which showed bilateral lower lobe densities improved,     especially on the right. 6. He had a CT of the chest with contrast, which showed severe right     lung pneumonia, moderate-to-large right pleural effusion and left     lower lobe pneumonia along with cardiomegaly and mediastinal     lymphadenopathy.  Other studies done include an echocardiogram which showed EF of 55-60% with grade 2 diastolic dysfunction, left atrium was mildly dilated.  PA pressure was 56.  PROCEDURES DONE DURING THIS HOSPITALIZATION:  Thoracentesis on the right side.  PERTINENT LABORATORIES:  Thrombocytopenia, the lowest platelet count was 84 and since then it has improved.  Mild anemia was also present  with a hemoglobin around 11.  He had elevated creatinine up to 1.5 when he was admitted, which also has become normal.  His albumin was low at 2.7. Mildly elevated total CKs.  Thyroid function tests were normal.  HIV was nonreactive.  Thoracentesis was done, which showed lymphocyte predominant, cell count of 1275.  All cultures were negative except for viral panel from his respiratory secretions which came back positive for adenovirus.  DISCHARGE DIAGNOSES: 1. Pneumonia, viral possibly secondary to adenovirus, improved. 2. Acute respiratory failure, resolved. 3. Obstructive sleep apnea on CPAP. 4. Protein-calorie malnutrition due to acute illness. 5. Hypertension, stable. 6. History of gout, stable. 7. History of prostate cancer, stable. 8. Acute renal failure, resolved. 9. Anemia stable. 10.Hypokalemia requiring potassium supplementations  BRIEF HOSPITAL COURSE: 1. Pneumonia.  This is a 65 year old Caucasian male who was admitted     to the hospital with complaints of fever and weakness. Initially,     there was no clear source for his fever.  The patient was, however,     put on antibiotics.  He then started becoming short of breath and x-     ray showed  pneumonia.  He was transferred to the ICU because of     respiratory failure and hypoxia.  He required BiPAP, but was never     intubated.  Pulmonary Critical Care Medicine was involved.  He     underwent a thoracentesis because of significant pleural effusion on     the right side and this showed that the cells were predominately     and lymphocytes, so there was a concern about tuberculosis process.     Sputum for AFB did not show any acid-fast bacilli, however, final     cultures are still pending.  In the meantime, his viral panel came     back positive for adenovirus, so it is possible that the patient     has a viral pneumonia.  His fever subsequently resolved after     almost 7-8 days.  He was taken off of antibacterial  agents.  He has     done well in the last 3 days, less short of breath.  He is able to     ambulate, however, he still requires oxygen which will be provided     to him at home.  He will require repeat chest x-ray in 1-2 weeks. 2. Acute renal failure.  Resolved with IV hydration. 3. Altered mental status was because of his infection, which is also     resolved and he is at baseline. 4. Obstructive sleep apnea.  He should continue with CPAP at home. 5. He has mild protein-calorie malnutrition due to his acute illness.     This should resolve as he improves. 6. History of hypertension.  He can resume his antihypertensive     agents. 7. History of gout.  Continue with allopurinol.  He has a remote     history of prostate cancer.  So at this time, the patient is stable for discharge. On the day of discharge, the patient is feeling well, keen on going home.  Vital signs are all quite stable.  His sats did drop to 84% with ambulation, so he will require home oxygen.  This will be arranged for him.  His lungs reveal crackles, bilateral bases which actually is sounding much better than earlier.  He does not have any wheezing.  His cardiac examination is unremarkable.  His abdomen is soft.  Neurologically, he is alert, oriented.  No focal neurological deficits are present.  MEDICATIONS ON DISCHARGE: 1. Albuterol inhaler 2 puffs, inhale every 6 hours as needed for     wheezing. 2. Potassium chloride 20 mEq p.o. daily. 3. Allopurinol 100 mg p.o. daily. 4. Ambien 10 mg nightly as needed for insomnia. 5. Aspirin 81 mg p.o. daily. 6. Atenolol 25 mg twice daily. 7. Benicar HCT 40/25 one tablet daily at bedtime. 8. Felodipine 5 mg daily. 9. Fiber capsules over-the-counter daily. 10.Lamictal 150 mg 2 tablets twice daily. 11.Minoxidil 10 mg 4 tablets every morning. 12.Multivitamins 1 tablet daily. 13.Simvastatin 20 mg at bedtime.  Follow up with Dr. Merri Brunette by next week, preferably this  Friday. The patient will call for appointment.  RECOMMENDATIONS TO PCP: 1. Please get a chest x-ray in 1-2 weeks. 2. He will again require chest x-ray in 6-8 weeks to ensure resolution     of his pulmonary process.  Dr. Sung Amabile saw the patient and felt that he does not need to follow up with Pulmonology unless PCP feels, otherwise if so please refer him to either Dr. Craige Cotta or Dr. Sung Amabile.  DIET:  Heart healthy.  PHYSICAL ACTIVITY:  Increase activity slowly.  He does not require any assistive devices.  TOTAL TIME ON THIS DISCHARGE ENCOUNTER:  40 minutes.     Osvaldo Shipper, MD     GK/MEDQ  D:  01/30/2011  T:  01/31/2011  Job:  [id number]  cc:   Soyla Murphy. Renne Crigler, M.D. Coralyn Helling, MD Lacretia Leigh. Ninetta Lights, M.D.  Electronically Signed by Osvaldo Shipper MD on 02/07/2011 07:20:43 PM

## 2011-02-08 LAB — IRON AND TIBC
Iron: 62 ug/dL (ref 42–135)
Saturation Ratios: 25 % (ref 20–55)
TIBC: 248 ug/dL (ref 215–435)
UIBC: 186 ug/dL

## 2011-02-08 LAB — CULTURE, BLOOD (ROUTINE X 2)

## 2011-02-08 LAB — FERRITIN: Ferritin: 230 ng/mL (ref 22–322)

## 2011-02-08 LAB — COMPREHENSIVE METABOLIC PANEL
ALT: 12 U/L (ref 0–53)
Alkaline Phosphatase: 72 U/L (ref 39–117)
CO2: 29 mEq/L (ref 19–32)
GFR calc Af Amer: >60 mL/min (ref >60)
Glucose, Bld: 95 mg/dL (ref 70–99)
Potassium: 4 mEq/L (ref 3.5–5.1)
Sodium: 137 mEq/L (ref 135–145)
Total Protein: 6.1 g/dL (ref 6.0–8.3)

## 2011-02-08 LAB — CBC
Hemoglobin: 10 g/dL — ABNORMAL LOW (ref 13.0–17.0)
MCHC: 33.4 g/dL (ref 30.0–36.0)
RBC: 3.44 MIL/uL — ABNORMAL LOW (ref 4.22–5.81)

## 2011-02-08 LAB — MICROALBUMIN, URINE: Microalb, Ur: 0.62 mg/dL (ref 0.00–1.89)

## 2011-02-08 LAB — VITAMIN B12: Vitamin B-12: 1234 pg/mL — ABNORMAL HIGH (ref 211–911)

## 2011-02-08 LAB — FREE PSA

## 2011-02-10 LAB — LIPASE, BLOOD: Lipase: 31 U/L (ref 11–59)

## 2011-02-10 LAB — BASIC METABOLIC PANEL
Chloride: 102 mEq/L (ref 96–112)
GFR calc Af Amer: >60 mL/min (ref >60)
GFR calc non Af Amer: >60 mL/min (ref >60)
Glucose, Bld: 94 mg/dL (ref 70–99)
Potassium: 3.8 mEq/L (ref 3.5–5.1)
Sodium: 137 mEq/L (ref 135–145)

## 2011-02-10 LAB — CBC
Hemoglobin: 10.6 g/dL — ABNORMAL LOW (ref 13.0–17.0)
MCHC: 33.2 g/dL (ref 30.0–36.0)
RBC: 3.71 MIL/uL — ABNORMAL LOW (ref 4.22–5.81)
WBC: 5.3 10*3/uL (ref 4.0–10.5)

## 2011-02-11 ENCOUNTER — Inpatient Hospital Stay (HOSPITAL_COMMUNITY): Payer: Medicare Other

## 2011-02-11 LAB — CULTURE, RESPIRATORY W GRAM STAIN: Culture: NORMAL

## 2011-02-12 ENCOUNTER — Inpatient Hospital Stay (HOSPITAL_COMMUNITY): Payer: Medicare Other

## 2011-02-12 DIAGNOSIS — J13 Pneumonia due to Streptococcus pneumoniae: Secondary | ICD-10-CM

## 2011-02-12 DIAGNOSIS — J9819 Other pulmonary collapse: Secondary | ICD-10-CM

## 2011-02-12 LAB — CBC
HCT: 29.1 % — ABNORMAL LOW (ref 39.0–52.0)
Platelets: 171 10*3/uL (ref 150–400)
RBC: 3.31 MIL/uL — ABNORMAL LOW (ref 4.22–5.81)
RDW: 15.3 % (ref 11.5–15.5)
WBC: 4.8 10*3/uL (ref 4.0–10.5)

## 2011-02-12 LAB — CULTURE, BLOOD (ROUTINE X 2)
Culture  Setup Time: 201207032011
Culture: NO GROWTH

## 2011-02-12 LAB — COMPREHENSIVE METABOLIC PANEL
AST: 21 U/L (ref 0–37)
Albumin: 2.7 g/dL — ABNORMAL LOW (ref 3.5–5.2)
Alkaline Phosphatase: 65 U/L (ref 39–117)
Chloride: 102 mEq/L (ref 96–112)
Potassium: 3.8 mEq/L (ref 3.5–5.1)
Total Bilirubin: 0.3 mg/dL (ref 0.3–1.2)

## 2011-02-12 LAB — BLOOD GAS, ARTERIAL
Acid-Base Excess: 5.9 mmol/L — ABNORMAL HIGH (ref 0.0–2.0)
Bicarbonate: 30.4 mEq/L — ABNORMAL HIGH (ref 20.0–24.0)
O2 Saturation: 81.1 %
Patient temperature: 98.6
TCO2: 28 mmol/L (ref 0–100)

## 2011-02-12 NOTE — Consult Note (Signed)
NAMEHANFORD, LUST NO.:  [id number]  MEDICAL RECORD NO.:  [id number]  LOCATION:  1512                         FACILITY:  [facility]  PHYSICIAN:  Charlcie Cradle. Delford Field, MD, FCCPDATE OF BIRTH:  Sep 25, 1945  DATE OF CONSULTATION:  02/12/2011 DATE OF DISCHARGE:                                CONSULTATION   CHIEF COMPLAINT:  Recurrent pneumonia, hypoxemia.  HISTORY OF PRESENT ILLNESS:  This is a 65 year old pleasant male who was admitted previously between the 15th and 26th of June for multilobar pneumonia thought to be viral with associated weakness.  He was placed on broad-spectrum antibiotics, discharged on January 30, 2011.  So he felt better initially but then became worse and came back for admission on February 06, 2011, via the [facility].  He had some nausea but denied fever.  There was some question of his CPAP having mold, was unsure if this was contributing to pneumonia or not.  His respiratory culture did grew candida albicans thought to be colonization.  He noted increasing lethargy on admission.  White count was 6700 with 74% neutrophils.  There is an important note that he had had a thoracentesis from prior hospitalization on the right with the right pleural effusion.  The cultures were negative.  A 1 L was drained during that hospitalization.  Another note is that going back to 6 years ago, his chest x-rays demonstrate elevation of the left hemidiaphragm and right hemidiaphragm.  This appears to be progressive on the left. The patient notes progressive lower extremity weakness as well.  He is followed by neurologist who has diagnosed with idiopathic peripheral neuropathy.  He is not a diabetic.  PAST HISTORY:  Medical history of obstructive sleep apnea, hypertension, gout, prostate cancer, acute renal failure, chronic kidney disease, normocytic anemia.  ALLERGIES:  CODEINE, PENICILLIN, OXYCODONE, SULFA.  FAMILY HISTORY:  Positive for  coronary disease and COPD.  HOME MEDICATIONS:  Currently include: 1. Vitamin D3 1000 units daily. 2. Simvastatin 20 mg daily. 3. Minoxidil 40 mg daily. 4. Lamictal 300 mg twice daily. 5. Fiber capsules daily. 6. Felodipine 5 mg daily. 7. Benicar HCT 40/12.5 one-half tablet at bedtime. 8. Atenolol 25 mg b.i.d. 9. Aspirin 325 mg daily. 10.Ambien 10 mg h.s. 11.Allopurinol 100 mg daily. 12.Albuterol p.r.n. 13.The patient also is on home CPAP but was not on oxygen prior to     this hospitalization.  REVIEW OF SYSTEMS:  Unremarkable.  SOCIAL HISTORY:  The patient is married.  Denies smoking, alcohol or illicit drugs.  PHYSICAL EXAM:  VITAL SIGNS:  Currently saturation is 88-89% on room air, 93-95% on 2 L; temperature 98; blood pressure 126/65; pulse 72; respirations 18. CHEST:  Showed decreased breath sounds bilateral and dullness to percussion one-quarter the way up bilateral.  It is difficult to determine if the diaphragm moves normally on the left. CARDIAC EXAM:  Showed a regular rate and rhythm without S3.  Normal S1, S2.ABDOMEN:  Soft, nontender. EXTREMITIES:  Showed no edema or clubbing. SKIN:  Clear. NEUROLOGIC EXAM:  Intact. HEENT EXAM:  No jugular venous distention, no lymphadenopathy. Oropharynx clear. NECK:  Supple.  LABORATORY DATA:  Chest x-ray show atelectasis  at the bases, low lung volumes and elevated hemidiaphragm.  The pleural effusions and infiltrate seen to my view have largely resolved.  There is no blood gas in the system.  Pleural fluid studies showed an exudate but cultures were negative from the prior hospitalization.  White count is 4800, hemoglobin is 9.8.  Sodium 137, potassium 3.8, chloride 102, CO2 of 28, BUN 10, creatinine 0.88, blood sugar is 90.  Respiratory culture from February 08, 2011, is normal flora.  Blood cultures showed coag-negative staph.  No yeasts or fungal elements are seen.  Pleural fluid from January 24, 2011, was normal or  negative.  IMPRESSION:  Bilateral lower lobe atelectasis on the basis of diaphragm elevation.  I am clearing whether this patient has chronic diaphragm paresis.  This may be related to the patient's peripheral neuropathy and lower extremity weakness.  The patient did have an HCAP pneumonia but this appears now to be resolved.  I am not altogether sure if additional antibiotics are necessary or warranted at this time.  The patient is still on the IV Levaquin as noted and Diflucan, as well as vancomycin recommendations.  We will ultrasound diaphragm to  check for diaphragm paresis.  We will check arterial blood gas on room air to assess for carbon dioxide elevation, may need BiPAP versus CPAP.  The patient may also need continued oxygen therapy at home.  We will obtain a flutter valve for this patient.  At this point, bronchodilator therapy does not appear to be necessary.  The patient is needing ongoing physical therapy and plans are for SNF Unit bed and I can follow this patient up as well in the [facility] office.     Charlcie Cradle Delford Field, MD, [facility]     PEW/MEDQ  D:  02/12/2011  T:  02/12/2011  Job:  [id number]  cc:   Soyla Murphy. Renne Crigler, M.D. Fax: [phone]  Electronically Signed by Shan Levans MD FCCP on 02/12/2011 08:22:10 PM

## 2011-02-13 ENCOUNTER — Telehealth: Payer: Self-pay | Admitting: *Deleted

## 2011-02-13 NOTE — Telephone Encounter (Signed)
Called, spoke with pt's wife.  HFU scheduled for July 26 at 3:45 in [place] -- wife aware.

## 2011-02-13 NOTE — Telephone Encounter (Signed)
Message copied by Gweneth Dimitri D on Tue Feb 13, 2011  8:57 AM ------      Message from: Shan Levans E      Created: Mon Feb 12, 2011  3:31 PM       This pt needs an ROV post hosp toward end of July in [place]      He is in snf at [facility].  Call his wife on her cell phone             pw

## 2011-02-15 NOTE — H&P (Signed)
[name], [name] NO.:  [id number]  MEDICAL RECORD NO.:  [id number]  LOCATION:  1512                         FACILITY:  [facility]  PHYSICIAN:  Conley Canal, MD      DATE OF BIRTH:  1945/12/31  DATE OF ADMISSION:  02/06/2011 DATE OF DISCHARGE:                             HISTORY & PHYSICAL   CHIEF COMPLAINT:  Generalized weakness, shortness of breath.  HISTORY OF PRESENT ILLNESS:  [name] is a 65 year old male, who was admitted on January 19, 2011 with fever and weakness, was found to have multilobar pneumonia thought to be viral.  He was placed on broad- spectrum antibiotics and eventually discharged on January 30, 2011 and he says that he felt better after discharge, but started feeling unwell again, in the last 2 days, he has been quite lethargic and has also been short of breath, hence presentation to the Emergency Room at [facility].  He complains of some nausea, but denies fever:  On further questioning today, his wife mentions that before the first admissions in June, patient had been using a CPAP machine, which was full off mold. She was not sure if this contributed to the pneumonia she had last time. In fact, respiratory cultures grew Candida albicans, which would normally be colonization, otherwise, patient mentions that after discharge, he worked with physical therapy, but 2 days after working with physical therapy and without being significantly lethargic.  In the Emergency Room at [facility], he had a chest x-ray, which showed increasing opacity at the right lung base suspicious for pneumonia and probable small right pleural effusion.  His labs included a white count of 6.7 with 74% neutrophils and electrolytes were significant for BUN of 8 and creatinine of 1.7.  He was given Levaquin and referred to [facility] for further management.  I reviewed lab results from last hospitalization including pleural fluid analysis.  PAST MEDICAL  HISTORY: 1. Recent pneumonia. 2. Obstructive sleep apnea. 3. Hypertension. 4. Gout. 5. History of prostate cancer. 6. Acute renal failure. 7. Normocytic anemia.  ALLERGIES: 1. CODEINE. 2. PENICILLIN. 3. OXYCODONE. 4. SULFA.  FAMILY HISTORY:  Family history significant for myocardial infarction, emphysema.  HOME MEDICATIONS:  Include: 1. Albuterol. 2. KCl. 3. Allopurinol. 4. Ambien. 5. Aspirin. 6. Atenolol. 7. Benicar. 8. Felodipine. 9. Lamictal. 10.Minoxidil. 11.Multivitamins. 12.Zocor.  REVIEW OF SYSTEMS:  Unremarkable except as highlighted in the history of present illness.  SOCIAL HISTORY:  Patient is married.  Denies cigarette smoking, alcohol or illicit drugs.  PHYSICAL EXAMINATION:  GENERAL:  On examination, this is a middle-aged male, who is not in acute distress.  He is accompanied by his wife at the bedside. VITAL SIGNS:  Blood pressure 100/45, heart rate is 67, temperature 98.3, respiration 16, oxygen saturation 97% on 3 liters nasal cannula. HEAD, EARS, NOSE AND THROAT EXAMINATION:  Pupils are equal and reacting to light.  No jugular venous distention.  No carotid bruits. RESPIRATORY SYSTEM:  Bilateral air entry with coarse rhonchi on the right base.  No wheezing. CARDIOVASCULAR SYSTEM:  First and second heart sounds heard.  No murmurs.  Pulse regular. ABDOMEN:  Soft, nontender.  No palpable organomegaly.  Bowel sounds  are normal. CNS:  Patient is alert, oriented in person, place and time with no acute focal neurological deficits. EXTREMITIES:  No pedal edema.  Peripheral pulses equal.  LABORATORY DATA:  Labs were reviewed significant for WBC of 6.7, hemoglobin of 12.4, hematocrit 36.3, platelet count 306,000.  Sodium 137, potassium 4.5, BUN 19, creatinine 1.7, calcium 10.2.  Cardiac enzymes negative x2.  Urinalysis negative.  DIAGNOSTIC STUDIES:  Chest x-ray shows increasing opacity of the right lung base suspicious for pneumonia and possible  small right pleural effusion.  IMPRESSION:  [age]-year-old male presenting with recurrent pneumonia and deconditioning.  The concern is for partially treated pneumonia versus healthcare-associated pneumonia versus pneumonia secondary to fungal infection.  The question of patient possibly inhaling mold could strengthen the concern for fungal infection.  The thought in the last admission was that patient probably had viral infection, which should have cleared up now.  PLAN: 1. Right lower lobe pneumonia:  We will admit patient to regular     medicine, place him on vancomycin, Levaquin as well as fluconazole.     If fever, we will send blood cultures.  He does not seem to have     significant amount of pleural effusion to consider re-taping of the     pleural fluid. 2. Acute renal failure:  This could be multifactorial in etiology     including recent contrast as well as antihypertensives and     antibiotics.  We will gently rehydrate patient hold Benicar for     now, monitor.  Consider renal ultrasound if not improving. 3. Obstructive sleep apnea:  Continue CPAP. 4. Gout:  Seems stable.  Continue allopurinol. 5. Hypertension:  Hold antihypertensives except atenolol for now as BP     is low normal. 6. DVT prophylaxis:  Lovenox. 7. GI prophylaxis:  Protonix. 8. Deconditioning:  We will ask physical therapy to see and manage     short-term rehab. 9. Depression:  Continue Lamictal. 10.Patient's condition is closely guarded.     Conley Canal, MD     SR/MEDQ  D:  02/06/2011  T:  02/07/2011  Job:  [id number]  cc:   Soyla Murphy. Renne Crigler, M.D. Fax: [phone]  Electronically Signed by Conley Canal  on 02/15/2011 07:30:46 PM

## 2011-02-15 NOTE — Discharge Summary (Signed)
[name], Glenn NO.:  [id number]  MEDICAL RECORD NO.:  [id number]  LOCATION:  1512                         FACILITY:  [facility]  PHYSICIAN:  Conley Canal, MD      DATE OF BIRTH:  [birth year]  DATE OF ADMISSION:  02/06/2011 DATE OF DISCHARGE:  02/12/2011                        DISCHARGE SUMMARY - REFERRING   PRIMARY CARE PHYSICIAN:  Soyla Murphy. Renne Crigler, M.D.  CONSULTING PHYSICIANS:  Charlcie Cradle. Delford Field, MD, FCCP  FINAL DIAGNOSES: 1. Healthcare-associated pneumonia with acute hypoxemia. 2. Obstructive sleep apnea, on CPAP. 3. History of prostate cancer. 4. Chronic kidney disease, stage III. 5. Chronically elevated diaphragm. 6. Normocytic anemia, probably anemia of critical illness     hemodilution.  Will need followup outpatient.  DISCHARGE MEDICATIONS: 1. Fluticasone nasal spray twice daily as needed. 2. Allopurinol 100 mg daily. 3. Ambien 10 mg q.h.s. as needed. 4. Aspirin 81 mg daily. 5. Atenolol 25 mg twice daily. 6. Felodipine 5 mg daily. 7. Fiber capsules OTC 1 capsule daily. 8. Lamictal 300 mg twice daily. 9. Minoxidil 40 mg daily. 10.Multivitamins 1 tablet daily. 11.Zocor 20 mg q.h.s. 12.Vitamin D3 1000 units daily.  PROCEDURES PERFORMED: 1. Chest x-ray on July 3 showed increasing opacities at the right lung     base, suspicious for pneumonia and probable small right pleural     effusion. 2. Renal ultrasound on July 4 showed bilateral renal cortical thinning     consistent with medical renal disease, small right inferior pole, 1-     cm simple renal cyst. 3. Followup chest x-ray on July 8 showed stable low lung volumes with     bibasilar atelectasis versus infiltrates. 4. Chest x-ray on July 9 showed no paradoxical motion of the     hemidiaphragms.  HOSPITAL COURSE:  [name] is a pleasant 65 year old gentleman who presented this time with generalized weakness and shortness of breath. He had been discharged on June 27 after close to 10  days stay for severe multilobar pneumonia, thought to be viral in origin.  He re-presented on July 3 with complaints of generalized weakness and shortness of breath with chest x-ray suggesting increasing opacity at the right lung base as well as probable small right pleural effusion.  He also had some evidence of acute renal failure with a BUN 18, creatinine 1.7. Subsequently, workup showed blood cultures negative.  PSA normal.  TSH normal.  Respiratory culture showed no growth.  Because of the suspicion for healthcare-associated pneumonia versus aspiration pneumonia, the patient was initially placed on vancomycin, Levaquin as well as fluconazole, as the patient's wife had mentioned that the patient's CPAP machine was full of mold, hence possibility of fungal pneumonia.  At presentation, the patient was started on broad-spectrum antibiotics and improved gradually but he remained somewhat hypoxic, hence a followup chest x-ray which suggested persistent infiltrates.  Dr. Shan Levans with pulmonary and critical care graciously saw the patient on July 9 to help address his lung issues.  His thought was that the patient had chronically elevated hemidiaphragms and might benefit from use of BiPAP machine instead of CPAP machine and that this chronically elevated hemidiaphragm was probably neurological in origin and that the patient should continue  to follow with his neurologist in [facility].  He should see the neurologist in the next 1 to 2 weeks.  The patient's wife will call the neurologist for a consult.  Otherwise, the patient completed 7 days of antibiotics during this hospital stay and his labs today include a white count of 4.8, hemoglobin 9.8, hematocrit 29.1, platelet count 171,000.  Electrolytes normal.  Respiratory culture showed no growth. Anemia panel was unremarkable.  He most likely has anemia of critical illness and this will need further follow up in an outpatient  setting. Because of the patient's chronic debility, he was referred to short-term rehabilitation where he is being discharged today.  He is otherwise discharged in stable condition.  Time spent for discharge preparation is less than 30 minutes.     Conley Canal, MD     SR/MEDQ  D:  02/12/2011  T:  02/12/2011  Job:  [id number]  Electronically Signed by Conley Canal  on 02/15/2011 07:30:50 PM

## 2011-02-16 ENCOUNTER — Telehealth: Payer: Self-pay | Admitting: Critical Care Medicine

## 2011-02-16 NOTE — Telephone Encounter (Signed)
COMPLETED

## 2011-02-21 LAB — FUNGUS CULTURE W SMEAR

## 2011-02-28 NOTE — Consult Note (Signed)
NAMEMarland Kitchen  ROMANO, STIGGER.:  [id number]  MEDICAL RECORD NO.:  [id number]  LOCATION:  5523                         FACILITY:  [facility]  PHYSICIAN:  Acey Lav, MD  DATE OF BIRTH:  Dec 29, 1945  DATE OF CONSULTATION:  01/20/2011 DATE OF DISCHARGE:                                CONSULTATION   REQUESTING PHYSICIAN:  Calvert Cantor, MD  PRIMARY CARE PHYSICIAN:  Soyla Murphy. Renne Crigler, MD  REASON FOR INFECTIOUS DISEASE CONSULTATION:  Fever of unknown origin.  HISTORY OF PRESENT ILLNESS:  Mr. Shippy is a 65 year old gentleman with past medical history significant for gout, peripheral neuropathy, prostate cancer status post radical prostatectomy who developed abrupt onset of fever on the 12th of this month.  Fever was accompanied by dry cough and some weakness.  His wife also noticed that he was becoming increasingly confused and seemed to have difficulty answering some of her questions.  Symptoms progressed and he ultimately came to the emergency department on Friday, the 15th.  In the ER, he was febrile to 103 degrees Fahrenheit.  Initial workup included a chest x-ray which failed to show any active cardiopulmonary disease.  He also had a renal ultrasound which showed no evidence of hydronephrosis.  He had urinalysis done which was negative for leukocytes or nitrites and microscopic examination showed granular casts and some bacteria.  His initial CBC was pertinent for thrombocytopenia with a white a platelet count of 117,000 and his labs since then have been pertinent for progressive thrombocytopenia and also for elevated CPK.  His procalcitonin have been negative serially.  He is admitted to the Hospitalist Service after blood cultures were drawn.  He was placed on ceftriaxone for empiric coverage for urinary tract infection.  In the interim, he has been discovered to have a faint erythematous rash along his shoulders and extending underneath his chin on his chest.   I was consulted to assist in management of workup of this patient with fever of unknown origin.  Per my recommendation, the patient was started on doxycycline for empiric coverage for [facility] spotted fever and ehrlichiosis.  PAST MEDICAL HISTORY: 1. Prostate cancer status post prostatectomy. 2. Hypertension. 3. Gout. 4. Peripheral neuropathy.  PAST SURGICAL HISTORY:  Prostatectomy as described above.  Appendectomy at the age of 6 for appendicitis.  FAMILY HISTORY:  Myocardial infarction in the family.  SOCIAL HISTORY:  The patient is nonsmoker, rare alcohol use, is married, lives at home, is a retired Optician, dispensing.  He traveled to [place] in the 1990s, otherwise he has largely traveled only along the [employer] [facility] [employer] see board.  ALLERGIES:  Has had swelling of his fingers with PENICILLIN at the age of 38, also allergic to SULFA.  REVIEW OF SYSTEMS:  As described in history of illness, otherwise 12 point review of systems is negative.  CURRENT MEDICATIONS:  Atenolol, ceftriaxone, doxycycline, Lovenox, Lamictal, Zocor, Tylenol, Vicodin, morphine sulfate, Zofran.  PHYSICAL EXAMINATION:  VITAL SIGNS:  Temperature maximum was 103, temperature current 100.2, blood pressure 159/71, pulse 82, respirations 18, pulse ox 91% on 2 liters nasal cannula.  Weight is 88.8 kg. GENERAL:  Pleasant gentleman, fatigued appearing, slightly pale. HEENT:  Normocephalic, atraumatic.  Pupils equal,  round, and reactive to light.  Sclerae anicteric.  Oropharynx clear. NECK:  Supple. CARDIOVASCULAR:  Regular rate and rhythm.  No murmurs, gallops, or rubs heard. LUNGS:  Clear to auscultation bilaterally. ABDOMEN:  Soft, nondistended, slightly protuberant.  Positive bowel sounds. EXTREMITIES:  With no edema. SKIN:  He has a faint macular erythematous rash along his back, also extending on to his chest. NEUROLOGIC:  He is alert and oriented to person, to place, to months, but not to the date.  He has  symmetric strength and sensation and neurologic exam was otherwise nonfocal.  LABORATORY DATA:  Chest x-ray as described above.  Pro calcitonin today is less than 0.1.  CPK today 417.  Lipid profile was unremarkable. Liver function tests and comprehensive metabolic panel were normal yesterday and today.  His sodium is 137, potassium 3.9, chloride 102, bicarb 27, BUN and creatinine 29 and 1.25, glucose 123.  His lactic acid yesterday was 0.9.  Urinalysis is as described above.  CBC differential today shows white count of 6.3, hemoglobin 12.1, and platelets of 99, ANC of 5.  IMPRESSION AND RECOMMENDATIONS:  A 65 year old gentleman with fever of unknown origin and rash. 1. Fever of unknown origin:  Certainly I think this gentleman deserves     to be treated for [facility] spotted fever and ehrlichiosis     with doxycycline.  I will let the ceftriaxone remain at the time     being.  I will see what the urine cultures show, although his     urinalysis is quite underwhelming.  I will also check some acute     titers for [facility] spotted fever and ehrlichiosis but these     should not be used to make decisions now, convalescent titers can     be checked in 2 weeks' time for confirmatory testing.  I do not     think he has meningitis with unimpressive exam for this.  His     normal white count and normal pro calcitonin would also argue     against the fulminant bacterial meningitis.  Certainly a viral     meningoencephalitis is possible.  I am skeptical that he has herpes     encephalitis, although would keep this possibility in the     differential.  For now I would recommend starting doxycycline and     observing how he does.  Should he worsen at all, I would get a     lumbar puncture and also check an MRI of the brain.  With lumbar     puncture, I would send for cell count differential, protein,     glucose, and herpes simplex by PCR.  Should he worsen, I would also     start  empiric acyclovir after obtaining the lumbar puncture.  I     will check serum HIV test as well recheck his CBC and metabolic     panel and CPK in the morning.     Acey Lav, MD     CV/MEDQ  D:  01/20/2011  T:  01/21/2011  Job:  [id number]  Electronically Signed by Paulette Blanch DAM MD on 02/28/2011 05:46:25 PM

## 2011-03-01 ENCOUNTER — Encounter: Payer: Self-pay | Admitting: Critical Care Medicine

## 2011-03-01 ENCOUNTER — Ambulatory Visit (INDEPENDENT_AMBULATORY_CARE_PROVIDER_SITE_OTHER): Payer: Medicare Other | Admitting: Critical Care Medicine

## 2011-03-01 DIAGNOSIS — D649 Anemia, unspecified: Secondary | ICD-10-CM | POA: Insufficient documentation

## 2011-03-01 DIAGNOSIS — J189 Pneumonia, unspecified organism: Secondary | ICD-10-CM

## 2011-03-01 DIAGNOSIS — J986 Disorders of diaphragm: Secondary | ICD-10-CM

## 2011-03-01 DIAGNOSIS — I10 Essential (primary) hypertension: Secondary | ICD-10-CM

## 2011-03-01 DIAGNOSIS — M109 Gout, unspecified: Secondary | ICD-10-CM | POA: Insufficient documentation

## 2011-03-01 DIAGNOSIS — Z8546 Personal history of malignant neoplasm of prostate: Secondary | ICD-10-CM

## 2011-03-01 DIAGNOSIS — G4733 Obstructive sleep apnea (adult) (pediatric): Secondary | ICD-10-CM

## 2011-03-01 NOTE — Progress Notes (Signed)
Subjective:    Patient ID: [name], male    DOB: [birth year], 65 y.o.   MRN: [id number]  HPI 65 y.o. WM 03/01/2011 Post Hosp OV Hosp 7/3- 02/12/11 Was d/c to rehab Since d/c doing better.  [place].  Getting lots of PT. Oxygen down to 1L in rehab No cough.  No chest pain.  No wheezing . 7/16 CXR clear!  Past Medical History  Diagnosis Date  . Pneumonia   . OSA (obstructive sleep apnea)   . History of prostate cancer   . Elevated diaphragm   . Normocytic anemia   . Hypoxemia   . Hypertension   . Gout      Family History  Problem Relation Age of Onset  . Emphysema Father   . Emphysema Brother   . Asthma Daughter      History   Social History  . Marital Status: Married    Spouse Name: N/A    Number of Children: 2  . Years of Education: N/A   Occupational History  . Retired     Optician, dispensing   Social History Main Topics  . Smoking status: Never Smoker   . Smokeless tobacco: Never Used  . Alcohol Use: Yes     occasionally  . Drug Use: No  . Sexually Active: Not on file   Other Topics Concern  . Not on file   Social History Narrative  . No narrative on file     Allergies  Allergen Reactions  . Codeine      nausea  . Penicillins     Swelling, rash     Outpatient Prescriptions Prior to Visit  Medication Sig Dispense Refill  . allopurinol (ZYLOPRIM) 100 MG tablet Take 100 mg by mouth daily.        Marland Kitchen aspirin 81 MG tablet Take 81 mg by mouth daily.        Marland Kitchen atenolol (TENORMIN) 25 MG tablet Take 25 mg by mouth 2 (two) times daily.        . Cholecalciferol (VITAMIN D3) 1000 UNITS tablet Take 1,000 Units by mouth daily.        . felodipine (PLENDIL) 5 MG 24 hr tablet Take 5 mg by mouth daily.        . Fiber CAPS Take 1 capsule by mouth daily.        . fluticasone (FLONASE) 50 MCG/ACT nasal spray Place 2 sprays into the nose 2 (two) times daily.       . LamoTRIgine 300 MG TB24 Take by mouth 2 (two) times daily.          Marland Kitchen MINOXIDIL PO Take 40 mg by mouth daily.        . Multiple Vitamin (MULTIVITAMIN) tablet Take 1 tablet by mouth daily.        . simvastatin (ZOCOR) 20 MG tablet Take 20 mg by mouth at bedtime.        Marland Kitchen zolpidem (AMBIEN) 10 MG tablet Take 10 mg by mouth at bedtime as needed.            Review of Systems Constitutional:   No  weight loss, night sweats,  Fevers, chills, fatigue, lassitude. HEENT:   No headaches,  Difficulty swallowing,  Tooth/dental problems,  Sore throat,                No sneezing, itching, ear ache, nasal congestion, post nasal drip,   CV:  No chest  pain,  Orthopnea, PND, swelling in lower extremities, anasarca, dizziness, palpitations  GI  No heartburn, indigestion, abdominal pain, nausea, vomiting, diarrhea, change in bowel habits, loss of appetite  Resp: No shortness of breath with exertion or at rest.  No excess mucus, no productive cough,  No non-productive cough,  No coughing up of blood.  No change in color of mucus.  No wheezing.  No chest wall deformity  Skin: no rash or lesions.  GU: no dysuria, change in color of urine, no urgency or frequency.  No flank pain.  MS:  No joint pain or swelling.  No decreased range of motion.  No back pain.  Psych:  No change in mood or affect. No depression or anxiety.  No memory loss.     Objective:   Physical Exam Filed Vitals:   03/01/11 1535  BP: 140/78  Pulse: 78  Temp: 99.1 F (37.3 C)  TempSrc: Oral  Height: 5\' 11"  (1.803 m)  Weight: 186 lb (84.369 kg)  SpO2: 92%    Gen: Pleasant, well-nourished, in no distress,  normal affect  ENT: No lesions,  mouth clear,  oropharynx clear, no postnasal drip  Neck: No JVD, no TMG, no carotid bruits  Lungs: No use of accessory muscles, no dullness to percussion, clear without rales or rhonchi  Cardiovascular: RRR, heart sounds normal, no murmur or gallops, no peripheral edema  Abdomen: soft and NT, no HSM,  BS normal  Musculoskeletal: No deformities, no  cyanosis or clubbing  Neuro: alert, non focal  Skin: Warm, no lesions or rashes        Assessment & Plan:   Pneumonia Recent hosp for PNA,  Now resolved . Plan No further antibiotics Reduce oxygen to 1liter with cpap only, room air day time Return 2 months    OSA (obstructive sleep apnea) Stable osa Plan Reduce oxygen to 1liter with cpap only, room air day time Return 2 months      Updated Medication List Outpatient Encounter Prescriptions as of 03/01/2011  Medication Sig Dispense Refill  . allopurinol (ZYLOPRIM) 100 MG tablet Take 100 mg by mouth daily.        Marland Kitchen aspirin 81 MG tablet Take 81 mg by mouth daily.        Marland Kitchen atenolol (TENORMIN) 25 MG tablet Take 25 mg by mouth 2 (two) times daily.        . Cholecalciferol (VITAMIN D3) 1000 UNITS tablet Take 1,000 Units by mouth daily.        . felodipine (PLENDIL) 5 MG 24 hr tablet Take 5 mg by mouth daily.        . Fiber CAPS Take 1 capsule by mouth daily.        . fluticasone (FLONASE) 50 MCG/ACT nasal spray Place 2 sprays into the nose 2 (two) times daily.       . LamoTRIgine 300 MG TB24 Take by mouth 2 (two) times daily.        Marland Kitchen MINOXIDIL PO Take 40 mg by mouth daily.        . Multiple Vitamin (MULTIVITAMIN) tablet Take 1 tablet by mouth daily.        . simvastatin (ZOCOR) 20 MG tablet Take 20 mg by mouth at bedtime.        Marland Kitchen zolpidem (AMBIEN) 10 MG tablet Take 10 mg by mouth at bedtime as needed.

## 2011-03-01 NOTE — Patient Instructions (Signed)
No further antibiotics Reduce oxygen to 1liter with cpap only, room air day time Return 2 months

## 2011-03-03 NOTE — Assessment & Plan Note (Signed)
Recent hosp for PNA,  Now resolved . Plan No further antibiotics Reduce oxygen to 1liter with cpap only, room air day time Return 2 months

## 2011-03-03 NOTE — Assessment & Plan Note (Signed)
Stable osa Plan Reduce oxygen to 1liter with cpap only, room air day time Return 2 months

## 2011-03-08 LAB — AFB CULTURE WITH SMEAR (NOT AT ARMC): Acid Fast Smear: NONE SEEN

## 2011-03-26 ENCOUNTER — Telehealth: Payer: Self-pay | Admitting: Critical Care Medicine

## 2011-03-26 DIAGNOSIS — G4733 Obstructive sleep apnea (adult) (pediatric): Secondary | ICD-10-CM

## 2011-03-26 NOTE — Telephone Encounter (Signed)
ONO on cpap plus 1L shows of sats <90% Lowest 81% <89%  I asked pt to increase to 3L plus cpap Repeat ONO

## 2011-03-26 NOTE — Telephone Encounter (Signed)
Order given to [facility] to repeat ono on cpap

## 2011-03-28 ENCOUNTER — Encounter: Payer: Self-pay | Admitting: Critical Care Medicine

## 2011-04-10 NOTE — Progress Notes (Signed)
NAMEALONZA, KNISLEY NO.:  [id number]  MEDICAL RECORD NO.:  [id number]  LOCATION:  2104                         FACILITY:  [facility]  PHYSICIAN:  Calvert Cantor, M.D.     DATE OF BIRTH:  Jul 29, 1946                                PROGRESS NOTE   PRIMARY CARE PHYSICIAN: Soyla Murphy. Renne Crigler, MD  PRESENTING COMPLAINT: Fever and weakness.  BRIEF HOSPITAL COURSE: This is a 65 year old male who developed fever and weakness and presented to the [facility] ER 2 days later.  He had a temperature of 103 degrees.  There was no clear source recognized.  The patient's urine had many bacteria per high-power field but eventually urine culture was negative.  Blood cultures were negative as well.  I began care of the patiet on the day following admission. He did admit to me that he had had a cough for about 5 days.  On exam, I noted that he had a macular rash on his neck, upper back, and upper chest.  At this point, I requested the assistance of Infectious Disease and it was suspected that the patient may have [facility] spotted fever.  He was started on doxycycline.  Rocephin had been initiated on admission and was continued.    On June 17, I was called because the patient's cough was worse.  A couple of hours later, I was called because he was in respiratory distress.  Upon evaluating the patient, he was noted to be quite tachypneic and therefore I decided that he will be transferred to the ICU.  He was placed on a BiPAP.  The patient had stated in the past that he would like a full code status.  A critical care consult was requested in case he may need intubation that night. However, the patient seemed to do well on the BiPAP.  It was continued for 2 days and was just discontinued this morning.  This morning when I last evaluated him, the patient was on a non-rebreather and maintaining his oxygen saturations.  Chest x-ray done on June 17 portable revealed right  lung base infiltrate which was superimposed on basilar atelectasis.  It was suspected he may have developed a pneumonia. Repeat chest x-ray today reveals increasing bilateral infiltrates. There is a question of whether this is vascular congestion.  At this point, it is difficult to tell if he has pulmonary edema or if this is a pneumonitis.  He continues to be febrile despite multiple antibiotics and a viral infection is suspected.  I have ordered a 2-D echo, results of which are pending.  Lasix has been ordered by Pulmonary Critical Care to see if he improves.  PHYSICAL EXAMINATION: GENERAL:  The patient is awake and alert, currently has no significant complaints other than a mild cough. LUNGS:  Reveal coarse breath sounds bilaterally.  He is not tachypneic. Oxygen saturations are 99% on a non-rebreather. HEART:  Regular rate and rhythm, tachycardic without any murmurs, rubs, or gallops. ABDOMEN:  Soft, nontender, nondistended.  Bowel sounds are positive. EXTREMITIES:  No cyanosis, clubbing, or edema.  CURRENT DIAGNOSES: 1. Acute respiratory failure, improving. 2. Diffuse pulmonary infiltrates, pulmonary edema versus diffuse  pneumonitis. 3. Fever of unknown origin suspected to be viral in nature. 4. Rash.  This did resolve on day 3 of admission, may also be related     to viral picture. 5. Thrombocytopenia which is now improving. 6. Mild rhabdomyolysis also improving. 7. Acute renal failure on admission now resolved. 8. Gout. 9. Peripheral neuropathy. 10.Hyperlipidemia.  The patient is currently being followed by Pulmonary Critical Care and Infectious Disease.  Time on patient care today was 50 minutes.     Calvert Cantor, M.D.     SR/MEDQ  D:  01/23/2011  T:  01/23/2011  Job:  [id number]  Electronically Signed by Calvert Cantor M.D. on 04/10/2011 02:28:13 PM

## 2011-04-13 ENCOUNTER — Institutional Professional Consult (permissible substitution): Payer: PRIVATE HEALTH INSURANCE | Admitting: Pulmonary Disease

## 2011-04-26 ENCOUNTER — Ambulatory Visit (INDEPENDENT_AMBULATORY_CARE_PROVIDER_SITE_OTHER): Payer: Medicare Other | Admitting: Critical Care Medicine

## 2011-04-26 ENCOUNTER — Encounter: Payer: Self-pay | Admitting: Critical Care Medicine

## 2011-04-26 VITALS — BP 148/82 | HR 68 | Temp 98.4°F | Ht 71.0 in | Wt 195.0 lb

## 2011-04-26 DIAGNOSIS — G4733 Obstructive sleep apnea (adult) (pediatric): Secondary | ICD-10-CM

## 2011-04-26 DIAGNOSIS — J189 Pneumonia, unspecified organism: Secondary | ICD-10-CM

## 2011-04-26 DIAGNOSIS — Z23 Encounter for immunization: Secondary | ICD-10-CM

## 2011-04-26 NOTE — Progress Notes (Signed)
Subjective:    Patient ID: [name], male    DOB: 08/06/1946, 65 y.o.   MRN: [id number]  HPI  65 y.o. WM 03/01/11 Post Hosp OV Hosp 7/3- 02/12/11 Was d/c to rehab Since d/c doing better.  [place].  Getting lots of PT. Oxygen down to 1L in rehab No cough.  No chest pain.  No wheezing . 7/16 CXR clear!   04/26/2011 Overall the patient is improved. The patient maintained CPAP at night with 3 L prude overnight sleep oximetry showed improved saturations on 3 L with CPAP. The patient's pneumonia has resolved. There are no new symptomatic complaints from pulmonary standpoint. The patient is planning to see a sleep physician in our  practice and I encouraged this. The patient will likely need a CPAP titration study in order to sort out the patient's oxygenation needs at night.  Past Medical History  Diagnosis Date  . Pneumonia   . OSA (obstructive sleep apnea)   . History of prostate cancer   . Elevated diaphragm   . Normocytic anemia   . Hypoxemia   . Hypertension   . Gout      Family History  Problem Relation Age of Onset  . Emphysema Father   . Emphysema Brother   . Asthma Daughter      History   Social History  . Marital Status: Married    Spouse Name: N/A    Number of Children: 2  . Years of Education: N/A   Occupational History  . Retired     Optician, dispensing   Social History Main Topics  . Smoking status: Never Smoker   . Smokeless tobacco: Never Used  . Alcohol Use: Yes     occasionally  . Drug Use: No  . Sexually Active: Not on file   Other Topics Concern  . Not on file   Social History Narrative  . No narrative on file     Allergies  Allergen Reactions  . Codeine      nausea  . Penicillins     Swelling, rash     Outpatient Prescriptions Prior to Visit  Medication Sig Dispense Refill  . allopurinol (ZYLOPRIM) 100 MG tablet Take 100 mg by mouth daily.        Marland Kitchen aspirin 81 MG tablet Take 81 mg by mouth daily.         Marland Kitchen atenolol (TENORMIN) 25 MG tablet Take 25 mg by mouth 2 (two) times daily.        . Cholecalciferol (VITAMIN D3) 1000 UNITS tablet Take 1,000 Units by mouth daily.        . felodipine (PLENDIL) 5 MG 24 hr tablet Take 5 mg by mouth daily.        . Fiber CAPS Take 1 capsule by mouth daily.        . fluticasone (FLONASE) 50 MCG/ACT nasal spray Place 2 sprays into the nose 2 (two) times daily.       . LamoTRIgine 300 MG TB24 Take by mouth 2 (two) times daily.        Marland Kitchen MINOXIDIL PO Take 40 mg by mouth daily.        . Multiple Vitamin (MULTIVITAMIN) tablet Take 1 tablet by mouth daily.        . simvastatin (ZOCOR) 20 MG tablet Take 20 mg by mouth at bedtime.        Marland Kitchen zolpidem (AMBIEN) 10 MG tablet Take 10  mg by mouth at bedtime as needed.            Review of Systems  Constitutional:   No  weight loss, night sweats,  Fevers, chills, fatigue, lassitude. HEENT:   No headaches,  Difficulty swallowing,  Tooth/dental problems,  Sore throat,                No sneezing, itching, ear ache, nasal congestion, post nasal drip,   CV:  No chest pain,  Orthopnea, PND, swelling in lower extremities, anasarca, dizziness, palpitations  GI  No heartburn, indigestion, abdominal pain, nausea, vomiting, diarrhea, change in bowel habits, loss of appetite  Resp: No shortness of breath with exertion or at rest.  No excess mucus, no productive cough,  No non-productive cough,  No coughing up of blood.  No change in color of mucus.  No wheezing.  No chest wall deformity  Skin: no rash or lesions.  GU: no dysuria, change in color of urine, no urgency or frequency.  No flank pain.  MS:  No joint pain or swelling.  No decreased range of motion.  No back pain.  Psych:  No change in mood or affect. No depression or anxiety.  No memory loss.     Objective:   Physical Exam  Filed Vitals:   04/26/11 1338  BP: 148/82  Pulse: 68  Temp: 98.4 F (36.9 C)  TempSrc: Oral  Height: 5\' 11"  (1.803 m)  Weight: 195  lb (88.451 kg)  SpO2: 96%    Gen: Pleasant, well-nourished, in no distress,  normal affect  ENT: No lesions,  mouth clear,  oropharynx clear, no postnasal drip  Neck: No JVD, no TMG, no carotid bruits  Lungs: No use of accessory muscles, no dullness to percussion, clear without rales or rhonchi  Cardiovascular: RRR, heart sounds normal, no murmur or gallops, no peripheral edema  Abdomen: soft and NT, no HSM,  BS normal  Musculoskeletal: No deformities, no cyanosis or clubbing  Neuro: alert, non focal  Skin: Warm, no lesions or rashes        Assessment & Plan:   Pneumonia Resolved PNA Plan  No further ABX needed   OSA (obstructive sleep apnea) ONO 03/12/11>>>64min sat <90% lowest 81% on cpap plus 1L Asked pt to increase to 3L and repeat ONO 04/01/11>>98% with only transient desat .  Pt may need increase in Cpap or even bilevel inorder to reduce oxygen dosing Plan Refer to Dr Vassie Loll for sleep med eval and poss cpap titration study Cont cpap with 3L oxygen for now      Updated Medication List Outpatient Encounter Prescriptions as of 04/26/2011  Medication Sig Dispense Refill  . allopurinol (ZYLOPRIM) 100 MG tablet Take 100 mg by mouth daily.        Marland Kitchen aspirin 81 MG tablet Take 81 mg by mouth daily.        Marland Kitchen atenolol (TENORMIN) 25 MG tablet Take 25 mg by mouth 2 (two) times daily.        . Cholecalciferol (VITAMIN D3) 1000 UNITS tablet Take 1,000 Units by mouth daily.        . felodipine (PLENDIL) 5 MG 24 hr tablet Take 5 mg by mouth daily.        . Fiber CAPS Take 1 capsule by mouth daily.        . fluticasone (FLONASE) 50 MCG/ACT nasal spray Place 2 sprays into the nose 2 (two) times daily.       . LamoTRIgine  300 MG TB24 Take by mouth 2 (two) times daily.        Marland Kitchen MINOXIDIL PO Take 40 mg by mouth daily.        . Multiple Vitamin (MULTIVITAMIN) tablet Take 1 tablet by mouth daily.        . simvastatin (ZOCOR) 20 MG tablet Take 20 mg by mouth at bedtime.        Marland Kitchen  zolpidem (AMBIEN) 10 MG tablet Take 10 mg by mouth at bedtime as needed.

## 2011-04-26 NOTE — Progress Notes (Deleted)
  Subjective:    Patient ID: [name], male    DOB: 1946/07/05, 65 y.o.   MRN: [id number]  HPI No issues during the day.  No changes in activty.  No hypersomnolence    Review of Systems     Objective:   Physical Exam        Assessment & Plan:

## 2011-04-26 NOTE — Assessment & Plan Note (Signed)
Resolved PNA Plan  No further ABX needed

## 2011-04-26 NOTE — Patient Instructions (Signed)
Keep appt with Dr Vassie Loll Flu vaccine today No change in medications. Return as needed

## 2011-04-26 NOTE — Assessment & Plan Note (Signed)
ONO 03/12/11>>>23min sat <90% lowest 81% on cpap plus 1L Asked pt to increase to 3L and repeat ONO 04/01/11>>98% with only transient desat .  Pt may need increase in Cpap or even bilevel inorder to reduce oxygen dosing Plan Refer to Dr Vassie Loll for sleep med eval and poss cpap titration study Cont cpap with 3L oxygen for now

## 2011-05-01 ENCOUNTER — Encounter: Payer: Self-pay | Admitting: Pulmonary Disease

## 2011-05-01 ENCOUNTER — Ambulatory Visit (INDEPENDENT_AMBULATORY_CARE_PROVIDER_SITE_OTHER): Payer: Medicare Other | Admitting: Pulmonary Disease

## 2011-05-01 VITALS — BP 142/82 | HR 66 | Temp 98.6°F | Ht 71.0 in | Wt 196.0 lb

## 2011-05-01 DIAGNOSIS — G4733 Obstructive sleep apnea (adult) (pediatric): Secondary | ICD-10-CM

## 2011-05-01 NOTE — Progress Notes (Addendum)
  Subjective:    Patient ID: [name], male    DOB: [birth year], 65 y.o.   MRN: [id number]  HPI PCP - Renne Crigler.  65/M for evaluation of obstructive sleep apnea . He underwent an overnight polysomnogram in 2003 at [facility] in [place] and has been maintained on CPAP of 9 cm since then with good results and compliance. He was hospitalized in July 2012 for severe community-acquired pneumonia and required extensive hospitalization and rehabilitation he was discharged on oxygen which was subsequently increased to 3 L blended into CPAP during sleep. He has lost 45 pounds from 240-196 now. ONO 03/12/11>>>43min sat <90% lowest 81% on cpap plus 1L  O2 was increased to 3L and repeat ONO 04/01/11>>98% with only transient desat . ESS 1/24 He denies excessive daytime somnolence, snoring, witnessed apneas while on the CPAP. There is no history suggestive of cataplexy, sleep paralysis or parasomnias   PSG from 7/03 shows mild OSA with AHI 16/h, lowest satn of 70%  Review of Systems  Constitutional: Negative for fever, appetite change and unexpected weight change.  HENT: Positive for sneezing. Negative for ear pain, congestion, sore throat, rhinorrhea, trouble swallowing, dental problem and postnasal drip.   Eyes: Negative for redness.  Respiratory: Negative for cough, shortness of breath and wheezing.   Cardiovascular: Negative for chest pain, palpitations and leg swelling.  Gastrointestinal: Negative for nausea, vomiting, abdominal pain and diarrhea.  Genitourinary: Negative for dysuria and urgency.  Musculoskeletal: Negative for joint swelling.  Skin: Negative for rash.  Neurological: Negative for syncope and headaches.  Hematological: Does not bruise/bleed easily.  Psychiatric/Behavioral: Negative for dysphoric mood. The patient is not nervous/anxious.        Objective:   Physical Exam  Gen. Pleasant, well-nourished, in no distress, normal affect ENT - no lesions, no post nasal  drip Neck: No JVD, no thyromegaly, no carotid bruits Lungs: no use of accessory muscles, no dullness to percussion, clear without rales or rhonchi  Cardiovascular: Rhythm regular, heart sounds  normal, no murmurs or gallops, no peripheral edema Abdomen: soft and non-tender, no hepatosplenomegaly, BS normal. Musculoskeletal: No deformities, no cyanosis or clubbing Neuro:  alert, non focal       Assessment & Plan:

## 2011-05-01 NOTE — Patient Instructions (Addendum)
Recheck oxygen testing on CPAP  In 2 weeks Stay off Oxygen the night of test We will revisit the question of CPAP pressure in 6 months

## 2011-05-02 ENCOUNTER — Encounter: Payer: Self-pay | Admitting: Critical Care Medicine

## 2011-05-02 NOTE — Assessment & Plan Note (Signed)
The oxygen requirement during sleep seems to be due to his recent pneumonia. As such I expect this to improve now that his pneumonia is resolved as noted on chest x-ray. Recheck oxygen testing on CPAP  In 2 weeks -Stay off Oxygen the night of test We will revisit the question of CPAP pressure in 6 months - the question remains whether he still needs his CPAP given his weight loss. He will revisit in 6 months and if he is able to keep off the weight we will perform an overnight polysomnogram. Meanwhile he will stay on his CPAP at 9 cm- the pressure seems to be low enough.

## 2011-05-15 LAB — CBC
HCT: 35.8 — ABNORMAL LOW
HCT: 37 — ABNORMAL LOW
MCHC: 33.6
MCV: 90.4
MCV: 91.3
Platelets: 181
Platelets: 231
RBC: 4.05 — ABNORMAL LOW
RDW: 14.1
WBC: 10.7 — ABNORMAL HIGH

## 2011-05-15 LAB — URINE CULTURE: Colony Count: 10000

## 2011-05-15 LAB — URINALYSIS, ROUTINE W REFLEX MICROSCOPIC
Glucose, UA: NEGATIVE
Leukocytes, UA: NEGATIVE
Protein, ur: 100 — AB
pH: 7

## 2011-05-15 LAB — CARDIAC PANEL(CRET KIN+CKTOT+MB+TROPI)
CK, MB: 1.3
CK, MB: 1.4
Total CK: 67
Troponin I: 0.01
Troponin I: 0.01

## 2011-05-15 LAB — I-STAT 8, (EC8 V) (CONVERTED LAB)
Acid-base deficit: 2
Bicarbonate: 26.1 — ABNORMAL HIGH
HCT: 39
Operator id: 198171
pCO2, Ven: 57.9 — ABNORMAL HIGH

## 2011-05-15 LAB — POCT I-STAT CREATININE
Creatinine, Ser: 1.3
Operator id: 198171

## 2011-05-15 LAB — PHOSPHORUS: Phosphorus: 2.7

## 2011-05-15 LAB — PROTIME-INR
INR: 0.9
Prothrombin Time: 13.1

## 2011-05-15 LAB — COMPREHENSIVE METABOLIC PANEL
Albumin: 3.2 — ABNORMAL LOW
BUN: 12
Calcium: 8.4
Creatinine, Ser: 0.92
Potassium: 3.6
Total Protein: 5.4 — ABNORMAL LOW

## 2011-05-15 LAB — APTT
aPTT: 26
aPTT: 30

## 2011-05-15 LAB — MAGNESIUM: Magnesium: 2.2

## 2011-05-15 LAB — DIFFERENTIAL
Eosinophils Relative: 1
Lymphocytes Relative: 9 — ABNORMAL LOW
Lymphs Abs: 0.9
Monocytes Relative: 6
Neutrophils Relative %: 85 — ABNORMAL HIGH

## 2011-05-15 LAB — POCT CARDIAC MARKERS: Myoglobin, poc: 101

## 2011-05-15 LAB — URINE MICROSCOPIC-ADD ON

## 2011-05-17 LAB — I-STAT 8, (EC8 V) (CONVERTED LAB)
Acid-Base Excess: 6 — ABNORMAL HIGH
Chloride: 105
HCT: 51
Operator id: 234501
TCO2: 30
pCO2, Ven: 35.8 — ABNORMAL LOW
pH, Ven: 7.516 — ABNORMAL HIGH

## 2011-05-17 LAB — HOMOCYSTEINE: Homocysteine: 7.9

## 2011-05-17 LAB — POCT I-STAT 3, ART BLOOD GAS (G3+)
Bicarbonate: 26.6 — ABNORMAL HIGH
Operator id: 277331
TCO2: 28
pH, Arterial: 7.435
pO2, Arterial: 68 — ABNORMAL LOW

## 2011-05-17 LAB — CARDIAC PANEL(CRET KIN+CKTOT+MB+TROPI)
CK, MB: 1.1
Relative Index: INVALID
Total CK: 55
Total CK: 55
Troponin I: 0.02
Troponin I: 0.02

## 2011-05-17 LAB — TSH: TSH: 1.344

## 2011-05-17 LAB — CBC
HCT: 49.3
MCHC: 34.3
MCV: 91.8
Platelets: 205
RDW: 13.3
RDW: 13.3
WBC: 9.5
WBC: 9.7

## 2011-05-17 LAB — URINALYSIS, ROUTINE W REFLEX MICROSCOPIC
Bilirubin Urine: NEGATIVE
Glucose, UA: NEGATIVE
Hgb urine dipstick: NEGATIVE
Specific Gravity, Urine: 1.016
pH: 6.5

## 2011-05-17 LAB — POCT I-STAT CREATININE: Operator id: 234501

## 2011-05-17 LAB — BASIC METABOLIC PANEL
BUN: 14
Chloride: 102
Creatinine, Ser: 0.84
GFR calc non Af Amer: >60
Glucose, Bld: 105 — ABNORMAL HIGH

## 2011-05-17 LAB — LIPID PANEL
Cholesterol: 264 — ABNORMAL HIGH
LDL Cholesterol: 196 — ABNORMAL HIGH

## 2011-05-17 LAB — CK TOTAL AND CKMB (NOT AT ARMC)
Relative Index: INVALID
Total CK: 56

## 2011-05-17 LAB — MAGNESIUM: Magnesium: 2.3

## 2011-05-17 LAB — CALCIUM: Calcium: 10.1

## 2011-05-17 LAB — APTT: aPTT: 27

## 2011-05-17 LAB — PROTIME-INR: INR: 0.9

## 2011-05-28 ENCOUNTER — Telehealth: Payer: Self-pay | Admitting: Critical Care Medicine

## 2011-05-28 DIAGNOSIS — G4733 Obstructive sleep apnea (adult) (pediatric): Secondary | ICD-10-CM

## 2011-05-28 NOTE — Telephone Encounter (Signed)
Encounter opened in error

## 2011-05-29 ENCOUNTER — Telehealth: Payer: Self-pay | Admitting: Pulmonary Disease

## 2011-05-29 NOTE — Telephone Encounter (Signed)
Called, spoke with pt.  States he done an ONO on RA with his CPAP at least 2 wks ago -- requesting results.  I advised pt Dr. Vassie Loll is off this week and is not scheduled to be back in the office until Nov 13 but I would forward message to RA to see if we can get an answer prior to Nov 13.  Pt states he is not having any problems and is still using the o2 with cpap qhs as directed but is curious at to what the ONO results show on RA with cpap. Advised I will send message to Dr. Vassie Loll.  We will contact him regarding this results as soon as Dr. Vassie Loll addresses this but to call back if he has any problems or further questions prior to this.  He verbalized understanding of this and is aware he may have to wait until Dr. Vassie Loll returns to office for the results.    Shanda Bumps, will you please make sure these results are in Dr. Reginia Naas look at so he can address asap when he returns?  Thanks!

## 2011-05-31 NOTE — Telephone Encounter (Signed)
Called, spoke with pt Kayla with [facility] - she will fax these results to triage.  Will await results.

## 2011-05-31 NOTE — Telephone Encounter (Signed)
Results received and placed in Dr. Reginia Naas to do.  Will forward message to him to address.

## 2011-05-31 NOTE — Telephone Encounter (Signed)
Pl obtain this test & place in my look-at

## 2011-05-31 NOTE — Telephone Encounter (Signed)
Not in RA's "look at". Dr. Vassie Loll did you receive this previously?

## 2011-06-01 NOTE — Telephone Encounter (Signed)
Reviewed ONO on RA/ CPAP from 05/16/11 >> 5 min satn < 88% Stay on O2 for now

## 2011-06-04 ENCOUNTER — Encounter: Payer: Self-pay | Admitting: Critical Care Medicine

## 2011-06-04 NOTE — Telephone Encounter (Signed)
Pt aware of ONO results and i have scheduled him for a 6 month reminder appt in the computer, advised pt if things not going well or thought of further questions to please call us back.

## 2011-08-30 DIAGNOSIS — L259 Unspecified contact dermatitis, unspecified cause: Secondary | ICD-10-CM | POA: Diagnosis not present

## 2011-08-30 DIAGNOSIS — Z85828 Personal history of other malignant neoplasm of skin: Secondary | ICD-10-CM | POA: Diagnosis not present

## 2011-08-30 DIAGNOSIS — L570 Actinic keratosis: Secondary | ICD-10-CM | POA: Diagnosis not present

## 2011-09-08 ENCOUNTER — Emergency Department (INDEPENDENT_AMBULATORY_CARE_PROVIDER_SITE_OTHER): Payer: Medicare Other

## 2011-09-08 ENCOUNTER — Other Ambulatory Visit: Payer: Self-pay

## 2011-09-08 ENCOUNTER — Encounter (HOSPITAL_BASED_OUTPATIENT_CLINIC_OR_DEPARTMENT_OTHER): Payer: Self-pay | Admitting: *Deleted

## 2011-09-08 ENCOUNTER — Emergency Department (HOSPITAL_BASED_OUTPATIENT_CLINIC_OR_DEPARTMENT_OTHER)
Admission: EM | Admit: 2011-09-08 | Discharge: 2011-09-08 | Disposition: A | Discharge status: Home / Self Care | Payer: Medicare Other | Attending: Emergency Medicine | Admitting: Emergency Medicine

## 2011-09-08 DIAGNOSIS — R42 Dizziness and giddiness: Secondary | ICD-10-CM

## 2011-09-08 DIAGNOSIS — R4182 Altered mental status, unspecified: Secondary | ICD-10-CM | POA: Diagnosis not present

## 2011-09-08 DIAGNOSIS — G4733 Obstructive sleep apnea (adult) (pediatric): Secondary | ICD-10-CM | POA: Diagnosis not present

## 2011-09-08 DIAGNOSIS — Z79899 Other long term (current) drug therapy: Secondary | ICD-10-CM | POA: Insufficient documentation

## 2011-09-08 DIAGNOSIS — I10 Essential (primary) hypertension: Secondary | ICD-10-CM | POA: Insufficient documentation

## 2011-09-08 DIAGNOSIS — E785 Hyperlipidemia, unspecified: Secondary | ICD-10-CM | POA: Diagnosis not present

## 2011-09-08 DIAGNOSIS — R0789 Other chest pain: Secondary | ICD-10-CM | POA: Diagnosis not present

## 2011-09-08 DIAGNOSIS — R51 Headache: Secondary | ICD-10-CM | POA: Diagnosis not present

## 2011-09-08 LAB — URINALYSIS, ROUTINE W REFLEX MICROSCOPIC
Bilirubin Urine: NEGATIVE
Leukocytes, UA: NEGATIVE
Nitrite: NEGATIVE
Specific Gravity, Urine: 1.012 (ref 1.005–1.030)
pH: 7.5 (ref 5.0–8.0)

## 2011-09-08 LAB — COMPREHENSIVE METABOLIC PANEL
ALT: 12 U/L (ref 0–53)
Albumin: 5.2 g/dL (ref 3.5–5.2)
Calcium: 10.5 mg/dL (ref 8.4–10.5)
GFR calc Af Amer: 78 mL/min — ABNORMAL LOW (ref >90)
Glucose, Bld: 106 mg/dL — ABNORMAL HIGH (ref 70–99)
Sodium: 141 mEq/L (ref 135–145)
Total Protein: 7.9 g/dL (ref 6.0–8.3)

## 2011-09-08 LAB — CBC
Hemoglobin: 13.4 g/dL (ref 13.0–17.0)
MCH: 30.6 pg (ref 26.0–34.0)
MCHC: 35.3 g/dL (ref 30.0–36.0)
Platelets: 136 10*3/uL — ABNORMAL LOW (ref 150–400)
RDW: 14.5 % (ref 11.5–15.5)

## 2011-09-08 MED ORDER — MECLIZINE HCL 12.5 MG PO TABS: 12.5000 mg | ORAL_TABLET | Freq: Three times a day (TID) | ORAL | Status: AC

## 2011-09-08 MED ORDER — LORAZEPAM 2 MG/ML IJ SOLN
1.0000 mg | Freq: Once | INTRAMUSCULAR | Status: AC
  Administered 2011-09-08: 1 mg via INTRAVENOUS
  Filled 2011-09-08: qty 1

## 2011-09-08 MED ORDER — DIAZEPAM 5 MG PO TABS: 5.0000 mg | ORAL_TABLET | Freq: Three times a day (TID) | ORAL | Status: AC | PRN

## 2011-09-08 MED ORDER — SODIUM CHLORIDE 0.9 % IV BOLUS (SEPSIS)
1000.0000 mL | Freq: Once | INTRAVENOUS | Status: AC
  Administered 2011-09-08: 1000 mL via INTRAVENOUS

## 2011-09-08 MED ORDER — MECLIZINE HCL 25 MG PO TABS
25.0000 mg | ORAL_TABLET | Freq: Once | ORAL | Status: AC
  Administered 2011-09-08: 25 mg via ORAL
  Filled 2011-09-08: qty 1

## 2011-09-08 NOTE — ED Provider Notes (Signed)
History     CSN: [id number]  Arrival date & time 09/08/11  1302   First MD Initiated Contact with Patient 09/08/11 1327      Chief Complaint  Patient presents with  . Dizziness    HPI The patient presents with dizziness.  He notes that he has been in his usual state of health until this morning.  He awoke, and after a period of being well, subacutely developed dizziness and disequilibrium.  He notes the symptoms were worse with motion, minimally improved at rest.  No near syncope, no pain throughout.  Mild nausea, no vomiting, no fevers, no chills, cough. He notes one similar prior episode last year, he was subsequently diagnosed with pneumonia. Past Medical History  Diagnosis Date  . Pneumonia   . OSA (obstructive sleep apnea)   . History of prostate cancer Feb 1993  . Elevated diaphragm   . Normocytic anemia   . Hypoxemia   . Hypertension   . Gout   . Peripheral neuropathy   . Hyperlipidemia     Past Surgical History  Procedure Date  . Appendectomy   . Prostatectomy   . Shoulder surgery     bone spur removed from right    Family History  Problem Relation Age of Onset  . Emphysema Father   . Emphysema Brother   . Asthma Daughter     History  Substance Use Topics  . Smoking status: Never Smoker   . Smokeless tobacco: Never Used  . Alcohol Use: Yes     occasionally      Review of Systems  Constitutional:       Per HPI, otherwise negative  HENT:       Per HPI, otherwise negative  Eyes: Negative.   Respiratory:       Per HPI, otherwise negative  Cardiovascular:       Per HPI, otherwise negative  Gastrointestinal: Negative for vomiting.  Genitourinary: Negative.   Musculoskeletal:       Per HPI, otherwise negative  Skin: Negative.   Neurological: Negative for syncope.    Allergies  Codeine and Penicillins  Home Medications   Current Outpatient Rx  Name Route Sig Dispense Refill  . ALLOPURINOL 100 MG PO TABS Oral Take 100 mg by mouth daily.       . ASPIRIN 81 MG PO TABS Oral Take 81 mg by mouth daily.      . ATENOLOL 25 MG PO TABS Oral Take 25 mg by mouth 2 (two) times daily.      Marland Kitchen VITAMIN D3 1000 UNITS PO TABS Oral Take 1,000 Units by mouth daily.      Marland Kitchen FELODIPINE ER 5 MG PO TB24 Oral Take 5 mg by mouth daily.      Marland Kitchen FIBER PO CAPS Oral Take 1 capsule by mouth daily.      Marland Kitchen FLUTICASONE PROPIONATE 50 MCG/ACT NA SUSP Nasal Place 2 sprays into the nose 2 (two) times daily.     Marland Kitchen LAMOTRIGINE ER 300 MG PO TB24 Oral Take by mouth 2 (two) times daily.      Marland Kitchen MINOXIDIL PO Oral Take 40 mg by mouth daily.      Marland Kitchen ONE-DAILY MULTI VITAMINS PO TABS Oral Take 1 tablet by mouth daily.      Marland Kitchen SIMVASTATIN 20 MG PO TABS Oral Take 20 mg by mouth at bedtime.      Marland Kitchen ZOLPIDEM TARTRATE 10 MG PO TABS Oral Take 10 mg by mouth at bedtime  as needed.        BP 177/77  Pulse 64  Temp(Src) 98.1 F (36.7 C) (Oral)  Resp 20  Ht 5\' 11"  (1.803 m)  Wt 196 lb (88.905 kg)  BMI 27.34 kg/m2  SpO2 100%  Physical Exam  Nursing note and vitals reviewed. Constitutional: He is oriented to person, place, and time. He appears well-developed. No distress.  HENT:  Head: Normocephalic and atraumatic.  Eyes: Conjunctivae and EOM are normal.  Cardiovascular: Normal rate and regular rhythm.   Pulmonary/Chest: Effort normal. No stridor. No respiratory distress.  Abdominal: He exhibits no distension.  Musculoskeletal: He exhibits no edema.  Neurological: He is alert and oriented to person, place, and time. He displays tremor. He displays no atrophy. No cranial nerve deficit or sensory deficit. He exhibits normal muscle tone. He displays no seizure activity. Coordination and gait normal.       Patient has no cerebellar deficits, was walked without difficulty, no cranial nerve or strength deficiencies  Skin: Skin is warm and dry.  Psychiatric: He has a normal mood and affect.    ED Course  Procedures (including critical care time)   Labs Reviewed  CBC    COMPREHENSIVE METABOLIC PANEL   No results found.   No diagnosis found.   4:48 PM Patient denies complaints.     Date: 09/08/2011  Rate: 63  Rhythm: normal sinus rhythm  QRS Axis: normal  Intervals: normal  ST/T Wave abnormalities: normal  Conduction Disutrbances:none  Narrative Interpretation:   Old EKG Reviewed: unchanged NORMAL ECG  CT, CXR reviewed by me.   MDM  This 66 year old male presents with disequilibrium.  By the time the patient arrived, and I evaluated him, the patient's symptoms have largely resolved.  Following emergency department interventions the patient is entirely asymptomatic.  Given the patient's description of subacute onset vertigo, the resolution, the absence of ongoing concerns as well as is essentially unremarkable laboratory and radiographic evaluation, his presentation is consistent with peripheral vertigo.  The absence of any pain throughout, or any unilateral neurologic symptoms his reassuring.  The patient has a primary care physician appointment in 2 days he'll be discharged on meclizine with this followup appointment.        Gerhard Munch, MD 09/08/11 1651

## 2011-09-08 NOTE — ED Notes (Signed)
Pt states he noticed a little dizziness this a.m. Around 1030. Denies other s/s.

## 2011-09-10 DIAGNOSIS — R42 Dizziness and giddiness: Secondary | ICD-10-CM | POA: Diagnosis not present

## 2011-09-10 DIAGNOSIS — G250 Essential tremor: Secondary | ICD-10-CM | POA: Diagnosis not present

## 2011-09-17 ENCOUNTER — Telehealth: Payer: Self-pay | Admitting: Pulmonary Disease

## 2011-09-17 DIAGNOSIS — G4733 Obstructive sleep apnea (adult) (pediatric): Secondary | ICD-10-CM

## 2011-09-17 NOTE — Telephone Encounter (Signed)
Called and spoke with pt and informed him of RA's response.  Pt was ok with this.    RA, just wanted to verify...did you want order for ONO to be on cpap and off o2.  Or just ONO on RA.  Please advise.  Thanks!

## 2011-09-17 NOTE — Telephone Encounter (Signed)
On CPAP/ RA please

## 2011-09-17 NOTE — Telephone Encounter (Signed)
Called and spoke with pt.  Pt states when he last saw RA 05/01/11, RA ordered ONO to see if pt still needed o2 with cpap, which results showed that he still needed to stay on o2.  Pt is wanting to know if RA would order a repeat ONO to see if pt can now come off o2.  Pt does have pending appt with RA on 10/30/11 and will keep this appt but is requesting order for ONO before then.  RA, please advise.  Thanks.

## 2011-09-17 NOTE — Telephone Encounter (Signed)
OK to rpt ONO on RA  a week before appt - so I can review during visit

## 2011-09-18 NOTE — Telephone Encounter (Signed)
Order sent to [facility].  

## 2011-10-22 DIAGNOSIS — Z947 Corneal transplant status: Secondary | ICD-10-CM | POA: Insufficient documentation

## 2011-10-22 DIAGNOSIS — H18609 Keratoconus, unspecified, unspecified eye: Secondary | ICD-10-CM | POA: Diagnosis not present

## 2011-10-22 DIAGNOSIS — H259 Unspecified age-related cataract: Secondary | ICD-10-CM | POA: Diagnosis not present

## 2011-10-25 DIAGNOSIS — N453 Epididymo-orchitis: Secondary | ICD-10-CM | POA: Diagnosis not present

## 2011-10-25 DIAGNOSIS — Z8546 Personal history of malignant neoplasm of prostate: Secondary | ICD-10-CM | POA: Diagnosis not present

## 2011-10-30 ENCOUNTER — Ambulatory Visit (INDEPENDENT_AMBULATORY_CARE_PROVIDER_SITE_OTHER): Payer: Medicare Other | Admitting: Pulmonary Disease

## 2011-10-30 ENCOUNTER — Encounter: Payer: Self-pay | Admitting: Pulmonary Disease

## 2011-10-30 VITALS — BP 142/70 | HR 65 | Temp 98.3°F | Ht 71.0 in | Wt 197.0 lb

## 2011-10-30 DIAGNOSIS — G4733 Obstructive sleep apnea (adult) (pediatric): Secondary | ICD-10-CM | POA: Diagnosis not present

## 2011-10-30 NOTE — Patient Instructions (Signed)
Schedule sleep study to retitrate CPAP & assess need for oxygen

## 2011-10-30 NOTE — Assessment & Plan Note (Signed)
Unclear if persistent desatn on oximetry is related to untreated OSA - does cpap need to be increased further? Pattern of desatn does not help to differnetiate. Hypoxia from pna should have resolved by now May be best to revisit with split study & retitrate CPAP & O2 as required on the same night Meanwhile, ct CPAP + O2 blended in

## 2011-10-30 NOTE — Progress Notes (Signed)
  Subjective:    Patient ID: [name], male    DOB: [birth year], 66 y.o.   MRN: [id number]  HPI PCP - Renne Crigler.  65/M for evaluation of obstructive sleep apnea .  He underwent an overnight polysomnogram in 2003 at [facility] in [place] and has been maintained on CPAP of 9 cm since then with good results and compliance. He was hospitalized in July 2012 for severe community-acquired pneumonia and required extensive hospitalization and rehabilitation he was discharged on oxygen which was subsequently increased to 3 L blended into CPAP during sleep. He has lost 45 pounds from 240-196 now.  ONO 03/12/11>>>61min sat <90% lowest 81% on cpap plus 1L  O2 was increased to 3L and repeat ONO 04/01/11>>98% with only transient desat .  ESS 1/24  He denies excessive daytime somnolence, snoring, witnessed apneas while on the CPAP.  PSG from 7/03 shows mild OSA with AHI 16/h, lowest satn of 70% Reviewed ONO on RA/ CPAP from 05/16/11 >> 5 min satn < 88%  Stay on O2 for now   10/30/2011 ONO on CPAP/ RA 3/13 shows desatn 45 mins < 88%, 2h < 90%  Patient states wearing cpap on average of 8 hours every night. Patient states nasal pillows are working good for him.   Review of Systems Patient denies significant dyspnea,cough, hemoptysis,  chest pain, palpitations, pedal edema, orthopnea, paroxysmal nocturnal dyspnea, lightheadedness, nausea, vomiting, abdominal or  leg pains      Objective:   Physical Exam  Gen. Pleasant, well-nourished, in no distress ENT - no lesions, no post nasal drip Neck: No JVD, no thyromegaly, no carotid bruits Lungs: no use of accessory muscles, no dullness to percussion, clear without rales or rhonchi  Cardiovascular: Rhythm regular, heart sounds  normal, no murmurs or gallops, no peripheral edema Musculoskeletal: No deformities, no cyanosis or clubbing        Assessment & Plan:

## 2011-10-31 DIAGNOSIS — M25519 Pain in unspecified shoulder: Secondary | ICD-10-CM | POA: Diagnosis not present

## 2011-11-05 ENCOUNTER — Encounter: Payer: Self-pay | Admitting: Pulmonary Disease

## 2011-11-05 DIAGNOSIS — S0530XA Ocular laceration without prolapse or loss of intraocular tissue, unspecified eye, initial encounter: Secondary | ICD-10-CM | POA: Diagnosis not present

## 2011-11-05 DIAGNOSIS — I498 Other specified cardiac arrhythmias: Secondary | ICD-10-CM | POA: Diagnosis not present

## 2011-11-05 DIAGNOSIS — H17829 Peripheral opacity of cornea, unspecified eye: Secondary | ICD-10-CM | POA: Diagnosis not present

## 2011-11-05 DIAGNOSIS — H5710 Ocular pain, unspecified eye: Secondary | ICD-10-CM | POA: Diagnosis not present

## 2011-11-05 DIAGNOSIS — W19XXXA Unspecified fall, initial encounter: Secondary | ICD-10-CM | POA: Diagnosis not present

## 2011-11-05 DIAGNOSIS — S0520XA Ocular laceration and rupture with prolapse or loss of intraocular tissue, unspecified eye, initial encounter: Secondary | ICD-10-CM | POA: Diagnosis not present

## 2011-11-05 DIAGNOSIS — H52219 Irregular astigmatism, unspecified eye: Secondary | ICD-10-CM | POA: Diagnosis not present

## 2011-11-08 DIAGNOSIS — S0530XA Ocular laceration without prolapse or loss of intraocular tissue, unspecified eye, initial encounter: Secondary | ICD-10-CM | POA: Diagnosis not present

## 2011-11-12 DIAGNOSIS — Z4889 Encounter for other specified surgical aftercare: Secondary | ICD-10-CM | POA: Diagnosis not present

## 2011-11-12 DIAGNOSIS — Z947 Corneal transplant status: Secondary | ICD-10-CM | POA: Diagnosis not present

## 2011-11-13 DIAGNOSIS — N453 Epididymo-orchitis: Secondary | ICD-10-CM | POA: Diagnosis not present

## 2011-11-19 DIAGNOSIS — Z9889 Other specified postprocedural states: Secondary | ICD-10-CM | POA: Diagnosis not present

## 2011-11-20 ENCOUNTER — Ambulatory Visit (HOSPITAL_BASED_OUTPATIENT_CLINIC_OR_DEPARTMENT_OTHER): Payer: Medicare Other | Attending: Pulmonary Disease | Admitting: General Practice

## 2011-11-20 VITALS — Ht 71.0 in | Wt 197.0 lb

## 2011-11-20 DIAGNOSIS — G4733 Obstructive sleep apnea (adult) (pediatric): Secondary | ICD-10-CM | POA: Insufficient documentation

## 2011-11-20 DIAGNOSIS — Z9989 Dependence on other enabling machines and devices: Secondary | ICD-10-CM

## 2011-11-20 DIAGNOSIS — R259 Unspecified abnormal involuntary movements: Secondary | ICD-10-CM | POA: Insufficient documentation

## 2011-11-26 ENCOUNTER — Telehealth: Payer: Self-pay | Admitting: Pulmonary Disease

## 2011-11-26 DIAGNOSIS — G4733 Obstructive sleep apnea (adult) (pediatric): Secondary | ICD-10-CM | POA: Diagnosis not present

## 2011-11-26 DIAGNOSIS — R259 Unspecified abnormal involuntary movements: Secondary | ICD-10-CM | POA: Diagnosis not present

## 2011-11-26 NOTE — Telephone Encounter (Signed)
SPlit study showed O2 still required -esp during REM sleep Increase CpAP to 12 cm , Keep 3 L O2 blended in OK to send Rx to DME

## 2011-11-27 NOTE — Telephone Encounter (Signed)
I spoke with patient about results and he verbalized understanding and had no questions. Order has been sent to [facility]

## 2011-11-27 NOTE — Procedures (Signed)
NAME:  [name], [name] NO.:  [id number]  MEDICAL RECORD NO.:  [id number]          PATIENT TYPE:  OUT  LOCATION:  SLEEP CENTER                 FACILITY:  [facility]  PHYSICIAN:  Oretha Milch, MD      DATE OF BIRTH:  01-16-1946  DATE OF STUDY:  11/20/2011                           NOCTURNAL POLYSOMNOGRAM  REFERRING PHYSICIAN:  Oretha Milch, MD  INDICATION FOR STUDY:  [name] is a 66 year old gentleman who was diagnosed with sleep apnea in 2003, and has been maintained on CPAP of 9 cm.  He had a recent hospitalization for community-acquired pneumonia in July 2012, and since then, uses 3 liters of oxygen blended into CPAP during sleep, he has lost 45 pounds.  Nocturnal oximetry on CPAP on room air showed desaturation of 45 minutes.  This study was performed to re- titrate and to see if oxygen is require.  At the time of this study, he weighed 197 pounds with a height of 5 feet and 11 inches, BMI of 27, neck size of 15.5 inches.  EPWORTH SLEEPINESS SCORE: 1.  This intervention polysomnogram was performed with sleep technologist in attendance.  EEG, EOG, EMG, EKG, and respiratory parameters were recorded.  Sleep stages, arousals, limb movements, and respiratory data were scored according to criteria laid out by the American Academy of Sleep Medicine.   SLEEP ARCHITECTURE:  Lights out was at 10:04 p.m., lights on was at 5:07 a.m.  Total sleep time was 124 minutes with a sleep period time of 137 minutes and the sleep efficiency of 73%.  Sleep latency was 32 minutes and REM sleep was not noted.  Sleep stages as a percentage of total sleep time was N1 6.5%, N2 93.5%, N3 0%, and REM sleep 0%.  Supine sleep was not noted.  During the titration portion, REM sleep was noted for 53 minutes (38 minutes).  The longest period of REM sleep was around 4 a.m.  RESPIRATORY DATA:  During the diagnostic portion, there were 16 obstructive apneas and 52 hypopneas with an AHI of  33 events per hour and a lowest desaturation of 85%.  Due to this degree of respiratory disturbance, CPAP was initiated at 4 cm with the fullface mask.  CPAP was titrated to a final level of 13 cm.  Severe desaturations were noted during REM sleep and hypopneas were noted.  At a level of 12 cm for 13.5 minutes of REM sleep, 5 obstructive apneas and 3 hypopneas were noted with a lowest desaturation of 84%.  I feel that the events were over- scored due to the presence of desaturations.  He had a mask sleep, but seems to have tolerated CPAP well.  AROUSAL DATA:  During the diagnostic portion, the arousal index was 27 events per hour.  During the titration portion, the arousal index was 15 events per hour.  OXYGEN DATA:  He spent 31 minutes during the titration portion with a saturation less than 88% and this was predominantly during REM sleep. The lowest desaturation was 70% during REM sleep.  CARDIAC DATA:  The low heart rate was 30 beats per minute.  The high heart rate recorded was an artifact.  MOVEMENT-PARASOMNIA:  Limb movements were present during the diagnostic portion and seemed to improve with the PLM arousal index of 1.7 events per hour during the titration portion.  IMPRESSIONS-RECOMMENDATIONS:  He was desensitized with a standard [employer] mask.  CPAP was titrated to 13 cm.  Severe desaturations were noted during REM sleep.  Unfortunately, oxygen was not applied.  He seemed to tolerate the CPAP well. 1. Severe obstructive sleep apnea with predominant hypopneas causing     sleep fragmentation and oxygen desaturation. 2. Oxygen desaturation was severe during REM sleep probably related to     hypoventilation and underlying cardiopulmonary disease. 3. No evidence of cardiac arrhythmias or behavioral disturbance during     sleep. 4. Limb movements seem to improve with CPAP therapy.  RECOMMENDATION: 1. CPAP can be initiated at 12 cm, oxygen can be blended in, and  this     can be titrated based on nocturnal oximetry. 2. He should be asked to avoid medication with sedative side effects.  He     should be cautioned against driving when sleepy.     Oretha Milch, MD    RVA/MEDQ  D:  11/26/2011 15:46:27  T:  11/27/2011 00:55:58  Job:  [id number]

## 2011-12-13 DIAGNOSIS — T8131XA Disruption of external operation (surgical) wound, not elsewhere classified, initial encounter: Secondary | ICD-10-CM | POA: Diagnosis not present

## 2011-12-13 DIAGNOSIS — S0530XA Ocular laceration without prolapse or loss of intraocular tissue, unspecified eye, initial encounter: Secondary | ICD-10-CM | POA: Diagnosis not present

## 2012-01-03 DIAGNOSIS — S0530XA Ocular laceration without prolapse or loss of intraocular tissue, unspecified eye, initial encounter: Secondary | ICD-10-CM | POA: Diagnosis not present

## 2012-01-03 DIAGNOSIS — T8133XA Disruption of traumatic injury wound repair, initial encounter: Secondary | ICD-10-CM | POA: Diagnosis not present

## 2012-01-28 DIAGNOSIS — Z48810 Encounter for surgical aftercare following surgery on the sense organs: Secondary | ICD-10-CM | POA: Diagnosis not present

## 2012-01-28 DIAGNOSIS — Z947 Corneal transplant status: Secondary | ICD-10-CM | POA: Diagnosis not present

## 2012-01-30 DIAGNOSIS — C44319 Basal cell carcinoma of skin of other parts of face: Secondary | ICD-10-CM | POA: Diagnosis not present

## 2012-01-30 DIAGNOSIS — L570 Actinic keratosis: Secondary | ICD-10-CM | POA: Diagnosis not present

## 2012-01-30 DIAGNOSIS — D0439 Carcinoma in situ of skin of other parts of face: Secondary | ICD-10-CM | POA: Diagnosis not present

## 2012-02-25 DIAGNOSIS — T8131XA Disruption of external operation (surgical) wound, not elsewhere classified, initial encounter: Secondary | ICD-10-CM | POA: Diagnosis not present

## 2012-02-25 DIAGNOSIS — Z48298 Encounter for aftercare following other organ transplant: Secondary | ICD-10-CM | POA: Diagnosis not present

## 2012-02-25 DIAGNOSIS — E782 Mixed hyperlipidemia: Secondary | ICD-10-CM | POA: Diagnosis not present

## 2012-02-25 DIAGNOSIS — Z79899 Other long term (current) drug therapy: Secondary | ICD-10-CM | POA: Diagnosis not present

## 2012-02-25 DIAGNOSIS — Z125 Encounter for screening for malignant neoplasm of prostate: Secondary | ICD-10-CM | POA: Diagnosis not present

## 2012-02-25 DIAGNOSIS — Z947 Corneal transplant status: Secondary | ICD-10-CM | POA: Diagnosis not present

## 2012-02-25 DIAGNOSIS — I10 Essential (primary) hypertension: Secondary | ICD-10-CM | POA: Diagnosis not present

## 2012-02-28 DIAGNOSIS — G609 Hereditary and idiopathic neuropathy, unspecified: Secondary | ICD-10-CM | POA: Diagnosis not present

## 2012-02-28 DIAGNOSIS — E7800 Pure hypercholesterolemia, unspecified: Secondary | ICD-10-CM | POA: Diagnosis not present

## 2012-02-28 DIAGNOSIS — I10 Essential (primary) hypertension: Secondary | ICD-10-CM | POA: Diagnosis not present

## 2012-02-28 DIAGNOSIS — Z1212 Encounter for screening for malignant neoplasm of rectum: Secondary | ICD-10-CM | POA: Diagnosis not present

## 2012-02-28 DIAGNOSIS — C61 Malignant neoplasm of prostate: Secondary | ICD-10-CM | POA: Diagnosis not present

## 2012-03-04 DIAGNOSIS — D235 Other benign neoplasm of skin of trunk: Secondary | ICD-10-CM | POA: Diagnosis not present

## 2012-03-04 DIAGNOSIS — Z85828 Personal history of other malignant neoplasm of skin: Secondary | ICD-10-CM | POA: Diagnosis not present

## 2012-04-21 DIAGNOSIS — H2510 Age-related nuclear cataract, unspecified eye: Secondary | ICD-10-CM | POA: Diagnosis not present

## 2012-04-21 DIAGNOSIS — T8131XA Disruption of external operation (surgical) wound, not elsewhere classified, initial encounter: Secondary | ICD-10-CM | POA: Diagnosis not present

## 2012-04-29 ENCOUNTER — Ambulatory Visit (INDEPENDENT_AMBULATORY_CARE_PROVIDER_SITE_OTHER): Payer: Medicare Other | Admitting: Pulmonary Disease

## 2012-04-29 ENCOUNTER — Encounter: Payer: Self-pay | Admitting: Pulmonary Disease

## 2012-04-29 VITALS — BP 186/90 | HR 63 | Temp 98.6°F | Ht 71.0 in | Wt 201.0 lb

## 2012-04-29 DIAGNOSIS — J189 Pneumonia, unspecified organism: Secondary | ICD-10-CM

## 2012-04-29 DIAGNOSIS — G4733 Obstructive sleep apnea (adult) (pediatric): Secondary | ICD-10-CM

## 2012-04-29 DIAGNOSIS — Z23 Encounter for immunization: Secondary | ICD-10-CM

## 2012-04-29 NOTE — Assessment & Plan Note (Signed)
-  resolved on CXr with bibasal scarring/crackles

## 2012-04-29 NOTE — Patient Instructions (Addendum)
Ambulatory satn Check satn during sleep on CPAP/ room air Flu shot We will send Rx to DME in [facility] - provide [place] with fax no in october

## 2012-04-29 NOTE — Assessment & Plan Note (Signed)
Ambulatory satn Check satn during sleep on CPAP/ room air Flu shot We will send Rx to DME in [facility] - provide [place] with fax no in October  Weight loss encouraged, compliance with goal of at least 4-6 hrs every night is the expectation. Advised against medications with sedative side effects Cautioned against driving when sleepy - understanding that sleepiness will vary on a day to day basis

## 2012-04-29 NOTE — Progress Notes (Signed)
  Subjective:    Patient ID: [name], male    DOB: 05/18/1946, 66 y.o.   MRN: [id number]  HPI PCP - Renne Crigler.   66/M for FU of obstructive sleep apnea .  He underwent an overnight polysomnogram in 2003 at [facility] in [place] and has been maintained on CPAP of 9 cm since then with good results and compliance. He was hospitalized in July 2012 for severe community-acquired pneumonia and required extensive hospitalization and rehabilitation he was discharged on oxygen which was subsequently increased to 3 L blended into CPAP during sleep. He has lost 45 pounds from 240-196 now.  ONO 03/12/11>>>50min sat <90% lowest 81% on cpap plus 1L  O2 was increased to 3L and repeat ONO 04/01/11>>98% with only transient desat .  PSG from 7/03 shows mild OSA with AHI 16/h, lowest satn of 70%  Reviewed ONO on RA/ CPAP from 05/16/11 >> 5 min satn < 88%    10/30/2011  ONO on CPAP/ RA 3/13 showed desatn 45 mins < 88%, 2h < 90%  SPlit study showed O2 still required -esp during REM sleep  Increased CpAP to 12 cm , Keep 3 L O2 blended in  04/29/2012 Patient states wearing cpap on average of 8 hours every night. Patient states nasal pillows are working good for him.  Needs recertification for O2 Did not desatn on ambulation  Review of Systems neg for any significant sore throat, dysphagia, itching, sneezing, nasal congestion or excess/ purulent secretions, fever, chills, sweats, unintended wt loss, pleuritic or exertional cp, hempoptysis, orthopnea pnd or change in chronic leg swelling. Also denies presyncope, palpitations, heartburn, abdominal pain, nausea, vomiting, diarrhea or change in bowel or urinary habits, dysuria,hematuria, rash, arthralgias, visual complaints, headache, numbness weakness or ataxia.     Objective:   Physical Exam Gen. Pleasant, well-nourished, in no distress ENT - no lesions, no post nasal drip Neck: No JVD, no thyromegaly, no carotid bruits Lungs: no use of accessory  muscles, no dullness to percussion, bibasal rales, no rhonchi  Cardiovascular: Rhythm regular, heart sounds  normal, no murmurs or gallops, no peripheral edema Musculoskeletal: No deformities, no cyanosis or clubbing          Assessment & Plan:

## 2012-05-02 DIAGNOSIS — IMO0002 Reserved for concepts with insufficient information to code with codable children: Secondary | ICD-10-CM | POA: Diagnosis not present

## 2012-05-02 DIAGNOSIS — M25559 Pain in unspecified hip: Secondary | ICD-10-CM | POA: Diagnosis not present

## 2012-05-08 DIAGNOSIS — IMO0002 Reserved for concepts with insufficient information to code with codable children: Secondary | ICD-10-CM | POA: Diagnosis not present

## 2012-05-14 ENCOUNTER — Telehealth: Payer: Self-pay | Admitting: Pulmonary Disease

## 2012-05-14 DIAGNOSIS — G4733 Obstructive sleep apnea (adult) (pediatric): Secondary | ICD-10-CM

## 2012-05-14 NOTE — Telephone Encounter (Signed)
ONO on CPAP/RA showed satn < 88% for only 4 mins- much imporved from prior OK to send order to DME to dc O2  Pl let pt know

## 2012-05-15 ENCOUNTER — Telehealth: Payer: Self-pay | Admitting: Pulmonary Disease

## 2012-05-15 NOTE — Telephone Encounter (Signed)
Duplicate message see phone note 05/14/12

## 2012-05-15 NOTE — Telephone Encounter (Signed)
I spoke with patient about results and he verbalized understanding and had no questions. Order has been sent to [facility]

## 2012-05-22 ENCOUNTER — Encounter: Payer: Self-pay | Admitting: Pulmonary Disease

## 2012-05-26 DIAGNOSIS — Z947 Corneal transplant status: Secondary | ICD-10-CM | POA: Diagnosis not present

## 2012-05-26 DIAGNOSIS — H18609 Keratoconus, unspecified, unspecified eye: Secondary | ICD-10-CM | POA: Diagnosis not present

## 2012-05-26 DIAGNOSIS — H259 Unspecified age-related cataract: Secondary | ICD-10-CM | POA: Diagnosis not present

## 2012-05-26 DIAGNOSIS — S0530XA Ocular laceration without prolapse or loss of intraocular tissue, unspecified eye, initial encounter: Secondary | ICD-10-CM | POA: Diagnosis not present

## 2012-05-29 DIAGNOSIS — R209 Unspecified disturbances of skin sensation: Secondary | ICD-10-CM | POA: Diagnosis not present

## 2012-05-29 DIAGNOSIS — G609 Hereditary and idiopathic neuropathy, unspecified: Secondary | ICD-10-CM | POA: Diagnosis not present

## 2012-05-29 DIAGNOSIS — G4733 Obstructive sleep apnea (adult) (pediatric): Secondary | ICD-10-CM | POA: Diagnosis not present

## 2012-06-03 DIAGNOSIS — J189 Pneumonia, unspecified organism: Secondary | ICD-10-CM | POA: Diagnosis not present

## 2012-06-06 DIAGNOSIS — R05 Cough: Secondary | ICD-10-CM | POA: Diagnosis not present

## 2012-06-06 DIAGNOSIS — J189 Pneumonia, unspecified organism: Secondary | ICD-10-CM | POA: Diagnosis not present

## 2012-06-11 DIAGNOSIS — J189 Pneumonia, unspecified organism: Secondary | ICD-10-CM | POA: Diagnosis not present

## 2012-06-23 DIAGNOSIS — Z947 Corneal transplant status: Secondary | ICD-10-CM | POA: Diagnosis not present

## 2012-06-23 DIAGNOSIS — Z48298 Encounter for aftercare following other organ transplant: Secondary | ICD-10-CM | POA: Diagnosis not present

## 2012-06-23 DIAGNOSIS — H01009 Unspecified blepharitis unspecified eye, unspecified eyelid: Secondary | ICD-10-CM | POA: Diagnosis not present

## 2012-06-23 DIAGNOSIS — Z760 Encounter for issue of repeat prescription: Secondary | ICD-10-CM | POA: Diagnosis not present

## 2012-06-23 DIAGNOSIS — H2510 Age-related nuclear cataract, unspecified eye: Secondary | ICD-10-CM | POA: Diagnosis not present

## 2012-07-22 DIAGNOSIS — H01009 Unspecified blepharitis unspecified eye, unspecified eyelid: Secondary | ICD-10-CM | POA: Diagnosis not present

## 2012-07-22 DIAGNOSIS — Z947 Corneal transplant status: Secondary | ICD-10-CM | POA: Diagnosis not present

## 2012-07-22 DIAGNOSIS — H259 Unspecified age-related cataract: Secondary | ICD-10-CM | POA: Diagnosis not present

## 2012-07-22 DIAGNOSIS — S0530XA Ocular laceration without prolapse or loss of intraocular tissue, unspecified eye, initial encounter: Secondary | ICD-10-CM | POA: Diagnosis not present

## 2012-07-22 DIAGNOSIS — H18609 Keratoconus, unspecified, unspecified eye: Secondary | ICD-10-CM | POA: Diagnosis not present

## 2012-10-02 DIAGNOSIS — H01009 Unspecified blepharitis unspecified eye, unspecified eyelid: Secondary | ICD-10-CM | POA: Diagnosis not present

## 2012-10-02 DIAGNOSIS — H2510 Age-related nuclear cataract, unspecified eye: Secondary | ICD-10-CM | POA: Diagnosis not present

## 2012-10-02 DIAGNOSIS — Z947 Corneal transplant status: Secondary | ICD-10-CM | POA: Diagnosis not present

## 2012-10-02 DIAGNOSIS — Z48298 Encounter for aftercare following other organ transplant: Secondary | ICD-10-CM | POA: Diagnosis not present

## 2012-11-30 ENCOUNTER — Emergency Department (HOSPITAL_BASED_OUTPATIENT_CLINIC_OR_DEPARTMENT_OTHER): Payer: Medicare Other

## 2012-11-30 ENCOUNTER — Emergency Department (HOSPITAL_BASED_OUTPATIENT_CLINIC_OR_DEPARTMENT_OTHER)
Admission: EM | Admit: 2012-11-30 | Discharge: 2012-11-30 | Disposition: A | Discharge status: Home / Self Care | Payer: Medicare Other | Attending: Emergency Medicine | Admitting: Emergency Medicine

## 2012-11-30 ENCOUNTER — Encounter (HOSPITAL_BASED_OUTPATIENT_CLINIC_OR_DEPARTMENT_OTHER): Payer: Self-pay | Admitting: *Deleted

## 2012-11-30 DIAGNOSIS — Z862 Personal history of diseases of the blood and blood-forming organs and certain disorders involving the immune mechanism: Secondary | ICD-10-CM | POA: Insufficient documentation

## 2012-11-30 DIAGNOSIS — Z8701 Personal history of pneumonia (recurrent): Secondary | ICD-10-CM | POA: Insufficient documentation

## 2012-11-30 DIAGNOSIS — Z8546 Personal history of malignant neoplasm of prostate: Secondary | ICD-10-CM | POA: Insufficient documentation

## 2012-11-30 DIAGNOSIS — IMO0002 Reserved for concepts with insufficient information to code with codable children: Secondary | ICD-10-CM | POA: Diagnosis not present

## 2012-11-30 DIAGNOSIS — S022XXA Fracture of nasal bones, initial encounter for closed fracture: Secondary | ICD-10-CM | POA: Insufficient documentation

## 2012-11-30 DIAGNOSIS — Z8709 Personal history of other diseases of the respiratory system: Secondary | ICD-10-CM | POA: Diagnosis not present

## 2012-11-30 DIAGNOSIS — W1809XA Striking against other object with subsequent fall, initial encounter: Secondary | ICD-10-CM | POA: Insufficient documentation

## 2012-11-30 DIAGNOSIS — Z880 Allergy status to penicillin: Secondary | ICD-10-CM | POA: Insufficient documentation

## 2012-11-30 DIAGNOSIS — Y9301 Activity, walking, marching and hiking: Secondary | ICD-10-CM | POA: Insufficient documentation

## 2012-11-30 DIAGNOSIS — M109 Gout, unspecified: Secondary | ICD-10-CM | POA: Insufficient documentation

## 2012-11-30 DIAGNOSIS — W010XXA Fall on same level from slipping, tripping and stumbling without subsequent striking against object, initial encounter: Secondary | ICD-10-CM | POA: Insufficient documentation

## 2012-11-30 DIAGNOSIS — Z8669 Personal history of other diseases of the nervous system and sense organs: Secondary | ICD-10-CM | POA: Insufficient documentation

## 2012-11-30 DIAGNOSIS — Z8639 Personal history of other endocrine, nutritional and metabolic disease: Secondary | ICD-10-CM | POA: Insufficient documentation

## 2012-11-30 DIAGNOSIS — Z79899 Other long term (current) drug therapy: Secondary | ICD-10-CM | POA: Diagnosis not present

## 2012-11-30 DIAGNOSIS — Z7982 Long term (current) use of aspirin: Secondary | ICD-10-CM | POA: Insufficient documentation

## 2012-11-30 DIAGNOSIS — S0003XA Contusion of scalp, initial encounter: Secondary | ICD-10-CM | POA: Diagnosis not present

## 2012-11-30 DIAGNOSIS — R040 Epistaxis: Secondary | ICD-10-CM | POA: Insufficient documentation

## 2012-11-30 DIAGNOSIS — I10 Essential (primary) hypertension: Secondary | ICD-10-CM | POA: Insufficient documentation

## 2012-11-30 DIAGNOSIS — Y92009 Unspecified place in unspecified non-institutional (private) residence as the place of occurrence of the external cause: Secondary | ICD-10-CM | POA: Insufficient documentation

## 2012-11-30 DIAGNOSIS — S1093XA Contusion of unspecified part of neck, initial encounter: Secondary | ICD-10-CM | POA: Diagnosis not present

## 2012-11-30 MED ORDER — LIDOCAINE-EPINEPHRINE-TETRACAINE (LET) SOLUTION
3.0000 mL | Freq: Once | NASAL | Status: AC
  Administered 2012-11-30: 3 mL via TOPICAL

## 2012-11-30 MED ORDER — CLINDAMYCIN HCL 150 MG PO CAPS
300.0000 mg | ORAL_CAPSULE | Freq: Once | ORAL | Status: AC
  Administered 2012-11-30: 300 mg via ORAL
  Filled 2012-11-30: qty 2

## 2012-11-30 MED ORDER — LIDOCAINE-EPINEPHRINE-TETRACAINE (LET) SOLUTION
NASAL | Status: AC
  Administered 2012-11-30: 3 mL via TOPICAL
  Filled 2012-11-30: qty 3

## 2012-11-30 MED ORDER — CLINDAMYCIN HCL 150 MG PO CAPS: 300.0000 mg | ORAL_CAPSULE | Freq: Four times a day (QID) | ORAL | Status: DC

## 2012-11-30 MED ORDER — OXYCODONE-ACETAMINOPHEN 5-325 MG PO TABS: 1.0000 | ORAL_TABLET | ORAL | Status: DC | PRN

## 2012-11-30 NOTE — ED Notes (Signed)
rx x 1 for clindamycin given - CD of CT given- pt has a ride at bedside

## 2012-11-30 NOTE — ED Notes (Signed)
Pt states he tripped over the door of the dishwasher and fell. Hit face on counter. Swelling across bridge of nose. Some blood from left nare. No LOC. Left pupil normally misshapen and right reactive to light. Ice applied at triage. Hematoma to forehead. Lac to bridge of nose. Bleeding controlled.

## 2012-11-30 NOTE — ED Provider Notes (Signed)
History     CSN: [id number]  Arrival date & time 11/30/12  0041   First MD Initiated Contact with Patient 11/30/12 0116      Chief Complaint  Patient presents with  . Fall     Patient is a 67 y.o. male presenting with fall. The history is provided by the patient.  Fall The accident occurred 1 to 2 hours ago. The fall occurred while walking. The point of impact was the head. The pain is present in the head (nose). The pain is mild. He was ambulatory at the scene. Pertinent negatives include no visual change, no abdominal pain, no nausea and no loss of consciousness. Exacerbated by: palpation. He has tried rest and ice for the symptoms. The treatment provided mild relief.  pt with mechanical fall at home.  No dizziness reported.  No cp/sob reported   Past Medical History  Diagnosis Date  . Pneumonia   . OSA (obstructive sleep apnea)   . History of prostate cancer Feb 1993  . Elevated diaphragm   . Normocytic anemia   . Hypoxemia   . Hypertension   . Gout   . Peripheral neuropathy   . Hyperlipidemia     Past Surgical History  Procedure Laterality Date  . Appendectomy    . Prostatectomy    . Shoulder surgery      bone spur removed from right    Family History  Problem Relation Age of Onset  . Emphysema Father   . Emphysema Brother   . Asthma Daughter     History  Substance Use Topics  . Smoking status: Never Smoker   . Smokeless tobacco: Never Used  . Alcohol Use: Yes     Comment: occasionally      Review of Systems  Constitutional: Negative for fatigue.  HENT: Positive for nosebleeds. Negative for neck pain.   Eyes: Negative for visual disturbance.  Gastrointestinal: Negative for nausea and abdominal pain.  Musculoskeletal: Negative for back pain.  Neurological: Negative for dizziness, loss of consciousness and syncope.    Allergies  Codeine and Penicillins  Home Medications   Current Outpatient Rx  Name  Route  Sig  Dispense  Refill  .  allopurinol (ZYLOPRIM) 100 MG tablet   Oral   Take 100 mg by mouth daily.           Marland Kitchen aspirin 81 MG tablet   Oral   Take 81 mg by mouth daily.           Marland Kitchen atenolol (TENORMIN) 25 MG tablet   Oral   Take 25 mg by mouth 2 (two) times daily.           . Cholecalciferol (VITAMIN D3) 1000 UNITS tablet   Oral   Take 1,000 Units by mouth daily.           . clindamycin (CLEOCIN) 150 MG capsule   Oral   Take 2 capsules (300 mg total) by mouth every 6 (six) hours.   28 capsule   0   . felodipine (PLENDIL) 5 MG 24 hr tablet   Oral   Take 5 mg by mouth daily.           . Fiber CAPS   Oral   Take 1 capsule by mouth daily.           . fluticasone (FLONASE) 50 MCG/ACT nasal spray   Nasal   Place 2 sprays into the nose as needed.          Marland Kitchen  LamoTRIgine 300 MG TB24   Oral   Take by mouth 2 (two) times daily.           Marland Kitchen MINOXIDIL PO   Oral   Take 40 mg by mouth daily.           . Multiple Vitamin (MULTIVITAMIN) tablet   Oral   Take 1 tablet by mouth daily.           Marland Kitchen oxyCODONE-acetaminophen (PERCOCET/ROXICET) 5-325 MG per tablet   Oral   Take 1 tablet by mouth every 4 (four) hours as needed for pain.   15 tablet   0   . zolpidem (AMBIEN) 10 MG tablet   Oral   Take 10 mg by mouth at bedtime as needed.             BP 183/73  Pulse 67  Temp(Src) 98.3 F (36.8 C) (Oral)  Resp 20  Ht 5\' 11"  (1.803 m)  Wt 200 lb (90.719 kg)  BMI 27.91 kg/m2  SpO2 97%  Physical Exam CONSTITUTIONAL: Well developed/well nourished HEAD: forehead hematoma noted EYES: EOMI (left pupil surgically altered previously) no proptosis ENMT: Mucous membranes moist. Laceration to bridge of nose.  No septal hematoma but dried blood in each nare.  Soft tissue swelling and tenderness to nose.  Midface appears stable. No lacerations noted to septum No malocclusion or mandibular pain/deformity.   NECK: supple no meningeal signs SPINE:entire spine nontender CV: S1/S2 noted, no  murmurs/rubs/gallops noted LUNGS: Lungs are clear to auscultation bilaterally, no apparent distress ABDOMEN: soft, nontender, no rebound or guarding NEURO: Pt is awake/alert, moves all extremitiesx4, GCS 15 EXTREMITIES: pulses normal, full ROM, no tenderness noted SKIN: warm, color normal PSYCH: no abnormalities of mood noted  ED Course  Procedures   LACERATION REPAIR Performed by: Joya Gaskins Consent: Verbal consent obtained. Risks and benefits: risks, benefits and alternatives were discussed Patient identity confirmed: provided demographic data Time out performed prior to procedure Prepped and Draped in normal sterile fashion Wound explored Laceration Location: nose Laceration Length: 1cm No Foreign Bodies seen or palpated Anesthesia:LET Amount of cleaning: standard Skin closure: simple Number of sutures or staples: 1 Technique: simple interrupted Patient tolerance: Patient tolerated the procedure well with no immediate complications.   Labs Reviewed - No data to display Ct Head Wo Contrast  11/30/2012  *RADIOLOGY REPORT*  Clinical Data:  Injury after fall.  The patient struck face on the counter with swelling to the bridge of the nose and swelling to the right side of the forehead.  No loss of consciousness.  CT HEAD AND ORBITS WITHOUT CONTRAST  Technique:  Contiguous axial images were obtained from the base of the skull through the vertex without contrast. Multidetector CT imaging of the orbits was performed using the standard protocol without intravenous contrast.  Comparison:  CT head 09/08/2011  CT HEAD  Findings: Subcutaneous soft tissue scalp hematoma over the right anterior frontal region.  No underlying skull fractures.  Diffuse cerebral atrophy.  No ventricular dilatation.  Patchy low attenuation in the white matter consistent with small vessel ischemia.  No mass effect or midline shift.  No abnormal extra- axial fluid collections.  Gray-white matter junctions are  distinct. Basal cisterns are not effaced.  Visualized mastoid air cells and paranasal sinuses are not opacified.  No depressed skull fractures. Vascular calcifications.  No significant change in the intracranial contents since previous study.  IMPRESSION: No acute intracranial abnormalities.  Right anterior frontal subcutaneous soft tissue hematoma.  CT  ORBITS  Findings: Mildly displaced nasal bone fractures with overlying soft tissue swelling and small amount of subcutaneous gas. Postoperative changes in the paranasal sinuses consistent with medial antrectomies.  Minimal mucosal thickening in the maxillary antra.  No acute air-fluid levels in the paranasal sinuses.  The globes and extraocular muscles appear intact and symmetrical.  The orbital rims, maxillary antral walls, pterygoid plates, zygomatic arches, frontal bones, and temporomandibular joints appear intact. No displaced fractures are appreciated.  IMPRESSION: Acute mildly depressed nasal bone fractures.  Orbits appear otherwise intact.   Original Report Authenticated By: Burman Nieves, M.D.    Ct Orbitss W/o Cm  11/30/2012  *RADIOLOGY REPORT*  Clinical Data:  Injury after fall.  The patient struck face on the counter with swelling to the bridge of the nose and swelling to the right side of the forehead.  No loss of consciousness.  CT HEAD AND ORBITS WITHOUT CONTRAST  Technique:  Contiguous axial images were obtained from the base of the skull through the vertex without contrast. Multidetector CT imaging of the orbits was performed using the standard protocol without intravenous contrast.  Comparison:  CT head 09/08/2011  CT HEAD  Findings: Subcutaneous soft tissue scalp hematoma over the right anterior frontal region.  No underlying skull fractures.  Diffuse cerebral atrophy.  No ventricular dilatation.  Patchy low attenuation in the white matter consistent with small vessel ischemia.  No mass effect or midline shift.  No abnormal extra- axial fluid  collections.  Gray-white matter junctions are distinct. Basal cisterns are not effaced.  Visualized mastoid air cells and paranasal sinuses are not opacified.  No depressed skull fractures. Vascular calcifications.  No significant change in the intracranial contents since previous study.  IMPRESSION: No acute intracranial abnormalities.  Right anterior frontal subcutaneous soft tissue hematoma.  CT ORBITS  Findings: Mildly displaced nasal bone fractures with overlying soft tissue swelling and small amount of subcutaneous gas. Postoperative changes in the paranasal sinuses consistent with medial antrectomies.  Minimal mucosal thickening in the maxillary antra.  No acute air-fluid levels in the paranasal sinuses.  The globes and extraocular muscles appear intact and symmetrical.  The orbital rims, maxillary antral walls, pterygoid plates, zygomatic arches, frontal bones, and temporomandibular joints appear intact. No displaced fractures are appreciated.  IMPRESSION: Acute mildly depressed nasal bone fractures.  Orbits appear otherwise intact.   Original Report Authenticated By: Burman Nieves, M.D.      1. Nasal fracture, closed, initial encounter     Pt found to have cephalohematoma and obvious nasal injury CT head ordered due to age/hematoma No head injury but no noted to have nasal fx He wants to f/u in [facility] CT imaging placed on CD Will start clindamycin and pain meds Advised to call for f/u with ENT Monday morning   MDM  Nursing notes including past medical history and social history reviewed and considered in documentation         Joya Gaskins, MD 11/30/12 [phone]

## 2012-12-02 DIAGNOSIS — H18609 Keratoconus, unspecified, unspecified eye: Secondary | ICD-10-CM | POA: Diagnosis not present

## 2012-12-02 DIAGNOSIS — IMO0002 Reserved for concepts with insufficient information to code with codable children: Secondary | ICD-10-CM | POA: Diagnosis not present

## 2012-12-02 DIAGNOSIS — H2589 Other age-related cataract: Secondary | ICD-10-CM | POA: Diagnosis not present

## 2012-12-02 DIAGNOSIS — Z947 Corneal transplant status: Secondary | ICD-10-CM | POA: Diagnosis not present

## 2012-12-02 DIAGNOSIS — H01009 Unspecified blepharitis unspecified eye, unspecified eyelid: Secondary | ICD-10-CM | POA: Diagnosis not present

## 2012-12-04 DIAGNOSIS — J387 Other diseases of larynx: Secondary | ICD-10-CM | POA: Diagnosis not present

## 2012-12-04 DIAGNOSIS — S022XXA Fracture of nasal bones, initial encounter for closed fracture: Secondary | ICD-10-CM | POA: Diagnosis not present

## 2012-12-04 DIAGNOSIS — W19XXXA Unspecified fall, initial encounter: Secondary | ICD-10-CM | POA: Diagnosis not present

## 2013-01-30 DIAGNOSIS — M76899 Other specified enthesopathies of unspecified lower limb, excluding foot: Secondary | ICD-10-CM | POA: Diagnosis not present

## 2013-02-27 DIAGNOSIS — E7800 Pure hypercholesterolemia, unspecified: Secondary | ICD-10-CM | POA: Diagnosis not present

## 2013-02-27 DIAGNOSIS — I10 Essential (primary) hypertension: Secondary | ICD-10-CM | POA: Diagnosis not present

## 2013-02-27 DIAGNOSIS — Z125 Encounter for screening for malignant neoplasm of prostate: Secondary | ICD-10-CM | POA: Diagnosis not present

## 2013-02-27 DIAGNOSIS — Z0000 Encounter for general adult medical examination without abnormal findings: Secondary | ICD-10-CM | POA: Diagnosis not present

## 2013-03-04 DIAGNOSIS — C61 Malignant neoplasm of prostate: Secondary | ICD-10-CM | POA: Diagnosis not present

## 2013-03-04 DIAGNOSIS — I10 Essential (primary) hypertension: Secondary | ICD-10-CM | POA: Diagnosis not present

## 2013-03-04 DIAGNOSIS — Z1331 Encounter for screening for depression: Secondary | ICD-10-CM | POA: Diagnosis not present

## 2013-03-04 DIAGNOSIS — G4733 Obstructive sleep apnea (adult) (pediatric): Secondary | ICD-10-CM | POA: Diagnosis not present

## 2013-03-04 DIAGNOSIS — Z1212 Encounter for screening for malignant neoplasm of rectum: Secondary | ICD-10-CM | POA: Diagnosis not present

## 2013-03-04 DIAGNOSIS — E7800 Pure hypercholesterolemia, unspecified: Secondary | ICD-10-CM | POA: Diagnosis not present

## 2013-03-10 DIAGNOSIS — J387 Other diseases of larynx: Secondary | ICD-10-CM | POA: Diagnosis not present

## 2013-04-02 DIAGNOSIS — Z006 Encounter for examination for normal comparison and control in clinical research program: Secondary | ICD-10-CM | POA: Diagnosis not present

## 2013-04-02 DIAGNOSIS — M109 Gout, unspecified: Secondary | ICD-10-CM | POA: Diagnosis not present

## 2013-04-02 DIAGNOSIS — I10 Essential (primary) hypertension: Secondary | ICD-10-CM | POA: Diagnosis not present

## 2013-04-02 DIAGNOSIS — E7800 Pure hypercholesterolemia, unspecified: Secondary | ICD-10-CM | POA: Diagnosis not present

## 2013-04-02 DIAGNOSIS — G609 Hereditary and idiopathic neuropathy, unspecified: Secondary | ICD-10-CM | POA: Diagnosis not present

## 2013-04-03 DIAGNOSIS — H2589 Other age-related cataract: Secondary | ICD-10-CM | POA: Diagnosis not present

## 2013-04-03 DIAGNOSIS — H01009 Unspecified blepharitis unspecified eye, unspecified eyelid: Secondary | ICD-10-CM | POA: Diagnosis not present

## 2013-04-03 DIAGNOSIS — T8131XA Disruption of external operation (surgical) wound, not elsewhere classified, initial encounter: Secondary | ICD-10-CM | POA: Diagnosis not present

## 2013-04-03 DIAGNOSIS — H2510 Age-related nuclear cataract, unspecified eye: Secondary | ICD-10-CM | POA: Diagnosis not present

## 2013-05-05 ENCOUNTER — Encounter: Payer: Self-pay | Admitting: Pulmonary Disease

## 2013-05-05 ENCOUNTER — Ambulatory Visit (INDEPENDENT_AMBULATORY_CARE_PROVIDER_SITE_OTHER): Payer: Medicare Other | Admitting: Pulmonary Disease

## 2013-05-05 VITALS — BP 144/82 | HR 88 | Temp 98.8°F | Ht 71.0 in | Wt 194.0 lb

## 2013-05-05 DIAGNOSIS — G4733 Obstructive sleep apnea (adult) (pediatric): Secondary | ICD-10-CM | POA: Diagnosis not present

## 2013-05-05 DIAGNOSIS — Z23 Encounter for immunization: Secondary | ICD-10-CM

## 2013-05-05 NOTE — Patient Instructions (Addendum)
Your CPAP is set at 12 cm Refill mask Rx for a year

## 2013-05-05 NOTE — Assessment & Plan Note (Signed)
Your CPAP is set at 12 cm Refill mask & other supplies for a year OK to stay off O2  Compliance with goal of at least 4-6 hrs every night is the expectation. Advised against medications with sedative side effects Cautioned against driving when sleepy - understanding that sleepiness will vary on a day to day basis

## 2013-05-05 NOTE — Progress Notes (Signed)
  Subjective:    Patient ID: [name], male    DOB: [birth year], 67 y.o.   MRN: [id number]  HPI  PCP - Renne Crigler.   67/M for FU of obstructive sleep apnea .  He underwent an overnight polysomnogram in 2003 at [facility] in [place] and has been maintained on CPAP of 9 cm since then with good results and compliance. He was hospitalized in July 2012 for severe community-acquired pneumonia and required oxygen  He  lost 45 pounds from 240-196 now.  PSG from 7/03 shows mild OSA with AHI 16/h, lowest satn of 70%   10/30/2011  SPlit study showed O2 still required -esp during REM sleep  Increased CpAP to 12 cm , Keep 3 L O2 blended in   05/05/2013 05/2012 ONO on CPAP/RA showed satn < 88% for only 4 mins- much imporved from prior - dc'd  O2   Pt reports he wears his CPAP everynight x 8 hrs a night. Pt denies any problems with mask/machine/pressure. He reports he does feel rested during the day.   Review of Systems  neg for any significant sore throat, dysphagia, itching, sneezing, nasal congestion or excess/ purulent secretions, fever, chills, sweats, unintended wt loss, pleuritic or exertional cp, hempoptysis, orthopnea pnd or change in chronic leg swelling. Also denies presyncope, palpitations, heartburn, abdominal pain, nausea, vomiting, diarrhea or change in bowel or urinary habits, dysuria,hematuria, rash, arthralgias, visual complaints, headache, numbness weakness or ataxia.     Objective:   Physical Exam   Gen. Pleasant, well-nourished, in no distress ENT - no lesions, no post nasal drip Neck: No JVD, no thyromegaly, no carotid bruits Lungs: no use of accessory muscles, no dullness to percussion, clear without rales or rhonchi  Cardiovascular: Rhythm regular, heart sounds  normal, no murmurs or gallops, no peripheral edema Musculoskeletal: No deformities, no cyanosis or clubbing         Assessment & Plan:

## 2013-05-12 DIAGNOSIS — J387 Other diseases of larynx: Secondary | ICD-10-CM | POA: Diagnosis not present

## 2013-06-25 DIAGNOSIS — H2589 Other age-related cataract: Secondary | ICD-10-CM | POA: Diagnosis not present

## 2013-06-25 DIAGNOSIS — H2510 Age-related nuclear cataract, unspecified eye: Secondary | ICD-10-CM | POA: Diagnosis not present

## 2013-06-25 DIAGNOSIS — Z947 Corneal transplant status: Secondary | ICD-10-CM | POA: Diagnosis not present

## 2013-06-25 DIAGNOSIS — H01009 Unspecified blepharitis unspecified eye, unspecified eyelid: Secondary | ICD-10-CM | POA: Diagnosis not present

## 2013-06-25 DIAGNOSIS — H579 Unspecified disorder of eye and adnexa: Secondary | ICD-10-CM | POA: Diagnosis not present

## 2013-06-26 DIAGNOSIS — M76899 Other specified enthesopathies of unspecified lower limb, excluding foot: Secondary | ICD-10-CM | POA: Diagnosis not present

## 2013-06-26 DIAGNOSIS — IMO0002 Reserved for concepts with insufficient information to code with codable children: Secondary | ICD-10-CM | POA: Diagnosis not present

## 2013-08-14 DIAGNOSIS — L988 Other specified disorders of the skin and subcutaneous tissue: Secondary | ICD-10-CM | POA: Diagnosis not present

## 2013-08-14 DIAGNOSIS — D485 Neoplasm of uncertain behavior of skin: Secondary | ICD-10-CM | POA: Diagnosis not present

## 2013-08-20 DIAGNOSIS — G4733 Obstructive sleep apnea (adult) (pediatric): Secondary | ICD-10-CM | POA: Diagnosis not present

## 2013-08-20 DIAGNOSIS — G250 Essential tremor: Secondary | ICD-10-CM | POA: Diagnosis not present

## 2013-08-20 DIAGNOSIS — G252 Other specified forms of tremor: Secondary | ICD-10-CM | POA: Diagnosis not present

## 2013-08-20 DIAGNOSIS — G609 Hereditary and idiopathic neuropathy, unspecified: Secondary | ICD-10-CM | POA: Diagnosis not present

## 2013-08-31 DIAGNOSIS — I10 Essential (primary) hypertension: Secondary | ICD-10-CM | POA: Diagnosis not present

## 2013-08-31 DIAGNOSIS — E7800 Pure hypercholesterolemia, unspecified: Secondary | ICD-10-CM | POA: Diagnosis not present

## 2013-08-31 DIAGNOSIS — Z79899 Other long term (current) drug therapy: Secondary | ICD-10-CM | POA: Diagnosis not present

## 2013-08-31 DIAGNOSIS — Z125 Encounter for screening for malignant neoplasm of prostate: Secondary | ICD-10-CM | POA: Diagnosis not present

## 2013-09-03 DIAGNOSIS — J387 Other diseases of larynx: Secondary | ICD-10-CM | POA: Diagnosis not present

## 2013-09-03 DIAGNOSIS — I10 Essential (primary) hypertension: Secondary | ICD-10-CM | POA: Diagnosis not present

## 2013-09-21 DIAGNOSIS — R6889 Other general symptoms and signs: Secondary | ICD-10-CM | POA: Diagnosis not present

## 2013-09-21 DIAGNOSIS — J387 Other diseases of larynx: Secondary | ICD-10-CM | POA: Diagnosis not present

## 2013-10-14 DIAGNOSIS — J029 Acute pharyngitis, unspecified: Secondary | ICD-10-CM | POA: Diagnosis not present

## 2013-10-22 DIAGNOSIS — J209 Acute bronchitis, unspecified: Secondary | ICD-10-CM | POA: Diagnosis not present

## 2013-10-22 DIAGNOSIS — J189 Pneumonia, unspecified organism: Secondary | ICD-10-CM | POA: Diagnosis not present

## 2013-10-29 DIAGNOSIS — J189 Pneumonia, unspecified organism: Secondary | ICD-10-CM | POA: Diagnosis not present

## 2013-11-30 DIAGNOSIS — H2589 Other age-related cataract: Secondary | ICD-10-CM | POA: Diagnosis not present

## 2013-11-30 DIAGNOSIS — H2510 Age-related nuclear cataract, unspecified eye: Secondary | ICD-10-CM | POA: Diagnosis not present

## 2013-11-30 DIAGNOSIS — H01009 Unspecified blepharitis unspecified eye, unspecified eyelid: Secondary | ICD-10-CM | POA: Diagnosis not present

## 2013-11-30 DIAGNOSIS — H348392 Tributary (branch) retinal vein occlusion, unspecified eye, stable: Secondary | ICD-10-CM | POA: Diagnosis not present

## 2013-11-30 DIAGNOSIS — H259 Unspecified age-related cataract: Secondary | ICD-10-CM | POA: Diagnosis not present

## 2013-11-30 DIAGNOSIS — S0530XA Ocular laceration without prolapse or loss of intraocular tissue, unspecified eye, initial encounter: Secondary | ICD-10-CM | POA: Diagnosis not present

## 2013-11-30 DIAGNOSIS — Z947 Corneal transplant status: Secondary | ICD-10-CM | POA: Diagnosis not present

## 2013-11-30 DIAGNOSIS — H18609 Keratoconus, unspecified, unspecified eye: Secondary | ICD-10-CM | POA: Diagnosis not present

## 2013-11-30 DIAGNOSIS — Z5189 Encounter for other specified aftercare: Secondary | ICD-10-CM | POA: Diagnosis not present

## 2014-01-12 DIAGNOSIS — H259 Unspecified age-related cataract: Secondary | ICD-10-CM | POA: Diagnosis not present

## 2014-01-12 DIAGNOSIS — H18609 Keratoconus, unspecified, unspecified eye: Secondary | ICD-10-CM | POA: Diagnosis not present

## 2014-01-12 DIAGNOSIS — Z947 Corneal transplant status: Secondary | ICD-10-CM | POA: Diagnosis not present

## 2014-01-12 DIAGNOSIS — H348392 Tributary (branch) retinal vein occlusion, unspecified eye, stable: Secondary | ICD-10-CM | POA: Diagnosis not present

## 2014-03-16 DIAGNOSIS — D649 Anemia, unspecified: Secondary | ICD-10-CM | POA: Diagnosis not present

## 2014-03-16 DIAGNOSIS — Z0000 Encounter for general adult medical examination without abnormal findings: Secondary | ICD-10-CM | POA: Diagnosis not present

## 2014-03-16 DIAGNOSIS — Z882 Allergy status to sulfonamides status: Secondary | ICD-10-CM | POA: Diagnosis not present

## 2014-03-16 DIAGNOSIS — E7800 Pure hypercholesterolemia, unspecified: Secondary | ICD-10-CM | POA: Diagnosis not present

## 2014-03-16 DIAGNOSIS — Z23 Encounter for immunization: Secondary | ICD-10-CM | POA: Diagnosis not present

## 2014-03-16 DIAGNOSIS — I10 Essential (primary) hypertension: Secondary | ICD-10-CM | POA: Diagnosis not present

## 2014-03-18 DIAGNOSIS — H2589 Other age-related cataract: Secondary | ICD-10-CM | POA: Diagnosis not present

## 2014-03-18 DIAGNOSIS — H2510 Age-related nuclear cataract, unspecified eye: Secondary | ICD-10-CM | POA: Diagnosis not present

## 2014-03-18 DIAGNOSIS — Z947 Corneal transplant status: Secondary | ICD-10-CM | POA: Diagnosis not present

## 2014-03-18 DIAGNOSIS — H348392 Tributary (branch) retinal vein occlusion, unspecified eye, stable: Secondary | ICD-10-CM | POA: Diagnosis not present

## 2014-03-18 DIAGNOSIS — H01009 Unspecified blepharitis unspecified eye, unspecified eyelid: Secondary | ICD-10-CM | POA: Diagnosis not present

## 2014-03-22 DIAGNOSIS — G4733 Obstructive sleep apnea (adult) (pediatric): Secondary | ICD-10-CM | POA: Diagnosis not present

## 2014-03-22 DIAGNOSIS — Z9889 Other specified postprocedural states: Secondary | ICD-10-CM | POA: Diagnosis not present

## 2014-03-22 DIAGNOSIS — G609 Hereditary and idiopathic neuropathy, unspecified: Secondary | ICD-10-CM | POA: Diagnosis not present

## 2014-03-22 DIAGNOSIS — E7800 Pure hypercholesterolemia, unspecified: Secondary | ICD-10-CM | POA: Diagnosis not present

## 2014-05-27 DIAGNOSIS — Z23 Encounter for immunization: Secondary | ICD-10-CM | POA: Diagnosis not present

## 2014-06-15 DIAGNOSIS — M5440 Lumbago with sciatica, unspecified side: Secondary | ICD-10-CM | POA: Insufficient documentation

## 2014-06-15 DIAGNOSIS — M461 Sacroiliitis, not elsewhere classified: Secondary | ICD-10-CM | POA: Diagnosis not present

## 2014-06-15 DIAGNOSIS — E7800 Pure hypercholesterolemia, unspecified: Secondary | ICD-10-CM | POA: Insufficient documentation

## 2014-06-15 DIAGNOSIS — M4716 Other spondylosis with myelopathy, lumbar region: Secondary | ICD-10-CM | POA: Insufficient documentation

## 2014-06-15 DIAGNOSIS — M5442 Lumbago with sciatica, left side: Secondary | ICD-10-CM | POA: Diagnosis not present

## 2014-06-15 DIAGNOSIS — M47816 Spondylosis without myelopathy or radiculopathy, lumbar region: Secondary | ICD-10-CM | POA: Diagnosis not present

## 2014-06-15 DIAGNOSIS — M5136 Other intervertebral disc degeneration, lumbar region: Secondary | ICD-10-CM | POA: Diagnosis not present

## 2014-06-15 DIAGNOSIS — M47817 Spondylosis without myelopathy or radiculopathy, lumbosacral region: Secondary | ICD-10-CM | POA: Insufficient documentation

## 2014-06-16 DIAGNOSIS — H34831 Tributary (branch) retinal vein occlusion, right eye: Secondary | ICD-10-CM | POA: Diagnosis not present

## 2014-06-24 ENCOUNTER — Encounter: Payer: Self-pay | Admitting: Pulmonary Disease

## 2014-06-24 ENCOUNTER — Ambulatory Visit (INDEPENDENT_AMBULATORY_CARE_PROVIDER_SITE_OTHER): Payer: Medicare Other | Admitting: Pulmonary Disease

## 2014-06-24 VITALS — BP 160/80 | HR 63 | Ht 71.0 in | Wt 197.0 lb

## 2014-06-24 DIAGNOSIS — G4733 Obstructive sleep apnea (adult) (pediatric): Secondary | ICD-10-CM | POA: Diagnosis not present

## 2014-06-24 NOTE — Progress Notes (Signed)
   Subjective:    Patient ID: [name], male    DOB: August 29, 1945, 68 y.o.   MRN: [id number]  HPI  PCP - Shelia Media.   68/M for FU of obstructive sleep apnea .  He underwent an overnight polysomnogram in 2003 at [facility] in [place] and has been maintained on CPAP of 9 cm since then with good results and compliance. He was hospitalized in July 2012 for severe community-acquired pneumonia and required oxygen He lost 45 pounds from 240-196 now.   Significant tests/ events  PSG from 02/2002 shows mild OSA with AHI 16/h, lowest satn of 70%   10/30/2011 SPlit study showed O2 still required -esp during REM sleep  Increased CpAP to 12 cm , Keep 3 L O2 blended in   05/2012 ONO on CPAP/RA showed satn < 88% for only 4 mins- much imporved from prior - dc'd O2   06/24/2014 Chief Complaint  Patient presents with  . Follow-up    uses CPAP every night, approx 8 hours nightly; download not available. Setting is on 12cm.  Uses nasal cannula instead of mask. no complaints    Pt reports he wears his CPAP everynight x 8 hrs a night. Pt denies any problems with mask/machine/pressure. He reports he does feel rested during the day.    Review of Systems neg for any significant sore throat, dysphagia, itching, sneezing, nasal congestion or excess/ purulent secretions, fever, chills, sweats, unintended wt loss, pleuritic or exertional cp, hempoptysis, orthopnea pnd or change in chronic leg swelling. Also denies presyncope, palpitations, heartburn, abdominal pain, nausea, vomiting, diarrhea or change in bowel or urinary habits, dysuria,hematuria, rash, arthralgias, visual complaints, headache, numbness weakness or ataxia.     Objective:   Physical Exam  Gen. Pleasant, well-nourished, in no distress ENT - no lesions, no post nasal drip Neck: No JVD, no thyromegaly, no carotid bruits Lungs: no use of accessory muscles, no dullness to percussion, clear without rales or rhonchi  Cardiovascular:  Rhythm regular, heart sounds  normal, no murmurs or gallops, no peripheral edema Musculoskeletal: No deformities, no cyanosis or clubbing        Assessment & Plan:

## 2014-06-24 NOTE — Patient Instructions (Signed)
CPAP download Supplies will be renewed x 1 year

## 2014-06-28 NOTE — Assessment & Plan Note (Signed)
CPAP download will be checked to ensure correct pressure settings Supplies will be renewed x 1 year He has done well after discontinuation of oxygen  Weight loss encouraged, compliance with goal of at least 4-6 hrs every night is the expectation. Advised against medications with sedative side effects Cautioned against driving when sleepy - understanding that sleepiness will vary on a day to day basis

## 2014-06-29 DIAGNOSIS — G4733 Obstructive sleep apnea (adult) (pediatric): Secondary | ICD-10-CM | POA: Diagnosis not present

## 2014-07-09 DIAGNOSIS — I10 Essential (primary) hypertension: Secondary | ICD-10-CM | POA: Diagnosis not present

## 2014-07-09 DIAGNOSIS — N62 Hypertrophy of breast: Secondary | ICD-10-CM | POA: Diagnosis not present

## 2014-07-09 DIAGNOSIS — E780 Pure hypercholesterolemia: Secondary | ICD-10-CM | POA: Diagnosis not present

## 2014-07-12 ENCOUNTER — Other Ambulatory Visit: Payer: Self-pay | Admitting: Internal Medicine

## 2014-07-12 DIAGNOSIS — N644 Mastodynia: Secondary | ICD-10-CM

## 2014-07-13 DIAGNOSIS — M5136 Other intervertebral disc degeneration, lumbar region: Secondary | ICD-10-CM | POA: Diagnosis not present

## 2014-07-13 DIAGNOSIS — M47816 Spondylosis without myelopathy or radiculopathy, lumbar region: Secondary | ICD-10-CM | POA: Diagnosis not present

## 2014-07-13 DIAGNOSIS — M461 Sacroiliitis, not elsewhere classified: Secondary | ICD-10-CM | POA: Diagnosis not present

## 2014-07-13 DIAGNOSIS — M5442 Lumbago with sciatica, left side: Secondary | ICD-10-CM | POA: Diagnosis not present

## 2014-07-22 ENCOUNTER — Ambulatory Visit
Admission: RE | Admit: 2014-07-22 | Discharge: 2014-07-22 | Disposition: A | Discharge status: Home / Self Care | Payer: Medicare Other | Source: Ambulatory Visit | Attending: Internal Medicine | Admitting: Internal Medicine

## 2014-07-22 DIAGNOSIS — N62 Hypertrophy of breast: Secondary | ICD-10-CM | POA: Diagnosis not present

## 2014-07-22 DIAGNOSIS — N644 Mastodynia: Secondary | ICD-10-CM

## 2014-08-24 DIAGNOSIS — R0789 Other chest pain: Secondary | ICD-10-CM | POA: Diagnosis not present

## 2014-09-10 ENCOUNTER — Encounter: Payer: Self-pay | Admitting: Neurology

## 2014-09-10 ENCOUNTER — Ambulatory Visit (INDEPENDENT_AMBULATORY_CARE_PROVIDER_SITE_OTHER): Payer: Medicare Other | Admitting: Neurology

## 2014-09-10 VITALS — HR 66 | Resp 14 | Ht 71.0 in | Wt 196.0 lb

## 2014-09-10 DIAGNOSIS — R208 Other disturbances of skin sensation: Secondary | ICD-10-CM

## 2014-09-10 DIAGNOSIS — G629 Polyneuropathy, unspecified: Secondary | ICD-10-CM | POA: Diagnosis not present

## 2014-09-10 DIAGNOSIS — G250 Essential tremor: Secondary | ICD-10-CM | POA: Diagnosis not present

## 2014-09-10 MED ORDER — LAMOTRIGINE 150 MG PO TABS: 300.0000 mg | ORAL_TABLET | Freq: Two times a day (BID) | ORAL | Status: DC

## 2014-09-10 NOTE — Progress Notes (Signed)
[facility]  PATIENT: [name] DOB: 08/24/1945  REFERRING CLINICIAN: Deland Pretty HISTORY FROM: Patient REASON FOR VISIT: Polyneuropathy and tremors   HISTORICAL  CHIEF COMPLAINT:  Chief Complaint  Patient presents with  . Peripheral Neuropathy    Sts neuropathy is about the same--tolerates Lamictal well.  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  He has a long history of polyneuropathy that became symptomatic in 1996 and was diagnosed in 2001 at [facility] after Nerve conduction and EMG testing .  These tests were repeated at [facility] in 2006 but we do not have those results at this time. From the neuropathy he has reduced balance and often needs to hold on for support. He stumbles frequently. He has not had any recent falls. Climbing bleachers is very difficult. He has a dysesthetic sensation with a burning pain that is worse in the feet and calves but goes up to the posterior thighs. Walking or sitting certain ways will increase the pain.    His feet always feel cold. His symptoms are worse at night. Lamotrigine 300 mg by mouth twice a day has helped the pain the most.   Heat also helps. Neurontin and a tricyclic antidepressant did not help the pain ad gababentin was not tolerated. Blood work including B12, cryoglobulins and electrophoresis had been essentially normal in the past, last tested in 2013.  He has an essential tremor in the left greater than right hand. The tremor is worse when he is upset or anxious. He also notes that the tremor intensifies when he tries to do fine motor tasks such as putting in his contact lenses.  He does better with heavy items (coffee mug).     He takes atenolol for his heart.  In the past, we tried Mysoline but even at a low  dose he felt very sleepy and needed to stop.    He feels the tremor has worsened over the past year.      He has sleep apnea and uses CPAP nightly. He feels more refreshed during the day and is less likely to doze off on CPAP that he was before he started using it. He no longer needs nocturnal oxygen.   He denies daytime sleepiness.         REVIEW OF SYSTEMS:  Constitutional: No fevers, chills, sweats, or change in appetite Eyes: No visual changes, double vision, eye pain Ear, nose and throat: No hearing loss, ear pain, nasal congestion, sore throat Cardiovascular: No chest pain, palpitations Respiratory:  No shortness of breath at rest or with exertion.   No wheezes GastrointestinaI: No nausea, vomiting, diarrhea, abdominal pain, fecal incontinence Genitourinary:  No dysuria, urinary retention or frequency.  No nocturia. Musculoskeletal:  No neck pain, back pain Integumentary: No rash, pruritus, skin lesions Neurological: as above Psychiatric: No depression at this time.  No anxiety Endocrine: No palpitations, diaphoresis, change in appetite, change in weigh or increased thirst Hematologic/Lymphatic:  No anemia, purpura, petechiae. Allergic/Immunologic: No itchy/runny eyes, nasal congestion, recent allergic reactions, rashes  ALLERGIES: Allergies  Allergen Reactions  . Codeine      nausea  . Penicillins     Swelling, rash  . Sulfa Antibiotics     HOME MEDICATIONS: Outpatient Prescriptions Prior to Visit  Medication Sig Dispense Refill  .  allopurinol (ZYLOPRIM) 100 MG tablet Take 100 mg by mouth daily.      Marland Kitchen aspirin 81 MG tablet Take 81 mg by mouth daily.      . Cholecalciferol (VITAMIN D3) 1000 UNITS tablet Take 1,000 Units by mouth daily.      . felodipine (PLENDIL) 5 MG 24 hr tablet Take 5 mg by mouth daily.      . Fiber CAPS Take 1 capsule by mouth daily.      . fluticasone (FLONASE) 50 MCG/ACT nasal spray Place 2 sprays into the nose as needed.     . meloxicam (MOBIC)  15 MG tablet Take 15 mg by mouth.    . Multiple Vitamin (MULTIVITAMIN) tablet Take 1 tablet by mouth daily.      Marland Kitchen zolpidem (AMBIEN) 10 MG tablet Take 10 mg by mouth at bedtime as needed.      Marland Kitchen atenolol (TENORMIN) 25 MG tablet Take 25 mg by mouth 2 (two) times daily.      Marland Kitchen omeprazole (PRILOSEC) 20 MG capsule Take 20 mg by mouth 2 (two) times daily.    . LamoTRIgine 300 MG TB24 Take by mouth 2 (two) times daily.      Marland Kitchen MINOXIDIL PO Take 40 mg by mouth daily.       No facility-administered medications prior to visit.    PAST MEDICAL HISTORY: Past Medical History  Diagnosis Date  . Pneumonia   . OSA (obstructive sleep apnea)   . History of prostate cancer Feb 1993  . Elevated diaphragm   . Normocytic anemia   . Hypoxemia   . Hypertension   . Gout   . Peripheral neuropathy   . Hyperlipidemia   . Cancer   . Vision abnormalities     PAST SURGICAL HISTORY: Past Surgical History  Procedure Laterality Date  . Appendectomy    . Prostatectomy    . Shoulder surgery      bone spur removed from right    FAMILY HISTORY: Family History  Problem Relation Age of Onset  . Emphysema Father   . Emphysema Brother   . Asthma Daughter   . Congestive Heart Failure Mother     SOCIAL HISTORY:  History   Social History  . Marital Status: Married    Spouse Name: N/A    Number of Children: 2  . Years of Education: N/A   Occupational History  . Retired     Company secretary   Social History Main Topics  . Smoking status: Never Smoker   . Smokeless tobacco: Never Used  . Alcohol Use: Yes     Comment: occasionally  . Drug Use: No  . Sexual Activity: Not on file   Other Topics Concern  . Not on file   Social History Narrative     PHYSICAL EXAM  Filed Vitals:   09/10/14 0924  Pulse: 66  Resp: 14  Height: 5\' 11"  (1.803 m)  Weight: 196 lb (88.905 kg)    Body mass index is 27.35 kg/(m^2).   General: The patient is well-developed and well-nourished and in no acute  distress  Neck: The neck is supple, no carotid bruits are noted.  The neck is nontender.  Cardiovascular: The cardiovascular examination reveals a regular rate and rhythm, no murmurs, gallops or rubs are noted.  Skin: Extremities are without significant edema.  Neurologic Exam  Mental status: The patient is alert and oriented x 3 at the time of the examination. The patient has apparent normal recent and remote  memory, with an apparently normal attention span and concentration ability.   Speech is normal.  Cranial nerves: Extraocular movements are full. The left pupil is larger than the right and has reduced reactivity. Visual acuity is reduced out of the left eye..  Facial symmetry is present. There is good facial sensation to soft touch bilaterally.Facial strength is normal.  Trapezius and sternocleidomastoid strength is normal. No dysarthria is noted.  The tongue is midline, and the patient has symmetric elevation of the soft palate.   Motor:  Muscle bulk and tone are normal. Strength is  5 / 5 in all 4 extremities.   Sensory: Sensory testing is intact to pinprick, soft touch, vibration sensation, and position sense in the arms but he has reduced vibration sensation in the upper toes and mildly at the ankles compared to the knees. He has a gradient of temperature sensation in the shin. As decreased touch sensation in his toes  and foot.   Coordination: Cerebellar testing reveals good finger-nose-finger and mildly reduced heel-to-shin bilaterally.  Gait and station: Station is stable with the eyes open but unstable with his eyes closed.  His gait is better when he looks down at his feet as he walks. He is unable to do a tandem walk when he looks up or with his eyes closed but is able to do tandem walk if he looks at his feet.  Reflexes: Deep tendon reflexes are symmetric and normal bilaterally. Plantar responses are normal.    DIAGNOSTIC DATA (LABS, IMAGING, TESTING) - I reviewed patient  records, labs, notes, testing and imaging myself where available.  Lab Results  Component Value Date   WBC 6.7 09/08/2011   HGB 13.4 09/08/2011   HCT 38.0* 09/08/2011   MCV 86.8 09/08/2011   PLT 136* 09/08/2011      Component Value Date/Time   NA 141 09/08/2011 1505   K 3.9 09/08/2011 1505   CL 101 09/08/2011 1505   CO2 28 09/08/2011 1505   GLUCOSE 106* 09/08/2011 1505   BUN 15 09/08/2011 1505   CREATININE 1.10 09/08/2011 1505   CALCIUM 10.5 09/08/2011 1505   PROT 7.9 09/08/2011 1505   ALBUMIN 5.2 09/08/2011 1505   AST 16 09/08/2011 1505   ALT 12 09/08/2011 1505   ALKPHOS 91 09/08/2011 1505   BILITOT 0.9 09/08/2011 1505   GFRNONAA 68* 09/08/2011 1505   GFRAA 78* 09/08/2011 1505   Lab Results  Component Value Date   CHOL 126 01/20/2011   HDL 51 01/20/2011   LDLCALC 63 01/20/2011   TRIG 60 01/20/2011   CHOLHDL 2.5 01/20/2011   No results found for: HGBA1C Lab Results  Component Value Date   VITAMINB12 1234* 02/08/2011   Lab Results  Component Value Date   TSH 2.255 02/08/2011        ASSESSMENT AND PLAN  Polyneuropathy  Dysesthesia  Essential tremor   In summary, Mr. 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 The dysesthesias have been fairly well controlled with Lomotil Romie Minus at a high dose of 300 mg by mouth twice a day. He tolerates that well. Other medicines were either ineffective or not tolerated. We discussed that if his dysesthesias worsen I would consider adding Trileptal as it might also help dysesthetic pain and is usually well tolerated. His tremor is slightly worse but he does not feel the action is bad enough to add another medicine at this time. If he continues to progress and becomes more  troublesome to his performance of activities of daily living, I would consider adding a benzodiazepine such as clonazepam did not tolerate Mysoline. His sleep apnea controlled on CPAP and he  denies excessive daytime sleepiness.  Mr. Bellew will return in one year for regular follow-up visit or sooner if he has new or worsening neurologic symptoms.   Richard A. Felecia Shelling, MD, PhD 11/07/4617, 0:12 AM Certified in Neurology, Clinical Neurophysiology, Sleep Medicine, Pain Medicine and Neuroimaging  [facility] [address] [place], [place] [address] [phone]

## 2014-09-22 DIAGNOSIS — D649 Anemia, unspecified: Secondary | ICD-10-CM | POA: Diagnosis not present

## 2014-09-22 DIAGNOSIS — Z125 Encounter for screening for malignant neoplasm of prostate: Secondary | ICD-10-CM | POA: Diagnosis not present

## 2014-09-27 DIAGNOSIS — H34831 Tributary (branch) retinal vein occlusion, right eye: Secondary | ICD-10-CM | POA: Diagnosis not present

## 2014-09-27 DIAGNOSIS — H01001 Unspecified blepharitis right upper eyelid: Secondary | ICD-10-CM | POA: Diagnosis not present

## 2014-09-27 DIAGNOSIS — H01009 Unspecified blepharitis unspecified eye, unspecified eyelid: Secondary | ICD-10-CM | POA: Insufficient documentation

## 2014-09-27 DIAGNOSIS — H25819 Combined forms of age-related cataract, unspecified eye: Secondary | ICD-10-CM | POA: Insufficient documentation

## 2014-09-27 DIAGNOSIS — H25812 Combined forms of age-related cataract, left eye: Secondary | ICD-10-CM | POA: Diagnosis not present

## 2014-09-27 DIAGNOSIS — H01005 Unspecified blepharitis left lower eyelid: Secondary | ICD-10-CM | POA: Diagnosis not present

## 2014-09-27 DIAGNOSIS — Z947 Corneal transplant status: Secondary | ICD-10-CM | POA: Diagnosis not present

## 2014-09-27 DIAGNOSIS — H348392 Tributary (branch) retinal vein occlusion, unspecified eye, stable: Secondary | ICD-10-CM | POA: Insufficient documentation

## 2014-09-27 DIAGNOSIS — H01004 Unspecified blepharitis left upper eyelid: Secondary | ICD-10-CM | POA: Diagnosis not present

## 2014-09-27 DIAGNOSIS — H2511 Age-related nuclear cataract, right eye: Secondary | ICD-10-CM | POA: Diagnosis not present

## 2014-09-27 DIAGNOSIS — H01002 Unspecified blepharitis right lower eyelid: Secondary | ICD-10-CM | POA: Diagnosis not present

## 2014-09-27 DIAGNOSIS — H2510 Age-related nuclear cataract, unspecified eye: Secondary | ICD-10-CM | POA: Insufficient documentation

## 2014-09-27 DIAGNOSIS — H18603 Keratoconus, unspecified, bilateral: Secondary | ICD-10-CM | POA: Diagnosis not present

## 2014-09-28 DIAGNOSIS — R918 Other nonspecific abnormal finding of lung field: Secondary | ICD-10-CM | POA: Diagnosis not present

## 2014-09-28 DIAGNOSIS — I10 Essential (primary) hypertension: Secondary | ICD-10-CM | POA: Diagnosis not present

## 2014-09-28 DIAGNOSIS — L309 Dermatitis, unspecified: Secondary | ICD-10-CM | POA: Diagnosis not present

## 2014-09-28 DIAGNOSIS — J387 Other diseases of larynx: Secondary | ICD-10-CM | POA: Diagnosis not present

## 2014-09-29 ENCOUNTER — Other Ambulatory Visit (HOSPITAL_BASED_OUTPATIENT_CLINIC_OR_DEPARTMENT_OTHER): Payer: Self-pay | Admitting: Internal Medicine

## 2014-09-29 ENCOUNTER — Telehealth: Payer: Self-pay | Admitting: Pulmonary Disease

## 2014-09-29 DIAGNOSIS — R918 Other nonspecific abnormal finding of lung field: Secondary | ICD-10-CM

## 2014-09-29 NOTE — Telephone Encounter (Signed)
Spoke with Maudie Mercury, pt already scheduled with MW for pulmonary nodule.  Nothing further needed at this time.

## 2014-09-30 ENCOUNTER — Ambulatory Visit (HOSPITAL_BASED_OUTPATIENT_CLINIC_OR_DEPARTMENT_OTHER)
Admission: RE | Admit: 2014-09-30 | Discharge: 2014-09-30 | Disposition: A | Discharge status: Home / Self Care | Payer: Medicare Other | Source: Ambulatory Visit | Attending: Internal Medicine | Admitting: Internal Medicine

## 2014-09-30 DIAGNOSIS — R918 Other nonspecific abnormal finding of lung field: Secondary | ICD-10-CM | POA: Diagnosis not present

## 2014-09-30 DIAGNOSIS — R0789 Other chest pain: Secondary | ICD-10-CM | POA: Insufficient documentation

## 2014-09-30 DIAGNOSIS — I313 Pericardial effusion (noninflammatory): Secondary | ICD-10-CM | POA: Insufficient documentation

## 2014-09-30 DIAGNOSIS — I700 Atherosclerosis of aorta: Secondary | ICD-10-CM | POA: Diagnosis not present

## 2014-10-07 ENCOUNTER — Institutional Professional Consult (permissible substitution): Payer: Medicare Other | Admitting: Internal Medicine

## 2014-10-08 ENCOUNTER — Encounter: Payer: Self-pay | Admitting: Internal Medicine

## 2014-10-08 ENCOUNTER — Ambulatory Visit (INDEPENDENT_AMBULATORY_CARE_PROVIDER_SITE_OTHER): Payer: Medicare Other | Admitting: Internal Medicine

## 2014-10-08 VITALS — BP 154/84 | HR 66 | Ht 71.0 in | Wt 192.0 lb

## 2014-10-08 DIAGNOSIS — R0781 Pleurodynia: Secondary | ICD-10-CM | POA: Diagnosis not present

## 2014-10-08 DIAGNOSIS — J479 Bronchiectasis, uncomplicated: Secondary | ICD-10-CM

## 2014-10-08 DIAGNOSIS — J986 Disorders of diaphragm: Secondary | ICD-10-CM

## 2014-10-08 NOTE — Patient Instructions (Addendum)
Bronchiectasis =   you have scarring of your bronchial tubes which means that they don't function perfectly normally and mucus tends to pool in certain areas of your lung which can cause pneumonia and further scarring of your lung and bronchial tubes  Whenever you develop cough congestion take mucinex or mucinex dm > these will help keep the mucus loose and flowing but if your condition worsens you need to seek help immediately preferably here or somewhere inside the [facility] system to compare xrays ( worse = darker or bloody mucus or pain on breathing in)    The rib fractures are on the 5th and 6th ribs right where your pain is an will be uncomfortable in certain positions you should avioid  Pulmonary follow up is as needed

## 2014-10-08 NOTE — Progress Notes (Signed)
Subjective:     Patient ID: [name], male   DOB: [birth year],    MRN: [id number]  HPI  64 yowm never smoker recurrent R flank pain x around 2005 made worse by lying down on R side with worst ever episode around early Feb 2016 > eval including cxr showing ? Nodules and CT c/w bronchiectasis so referred to pulmonary clinic 10/08/14  by Dr Deland Pretty to sort out.   Note previously seen by our service in 2012:  [place]: 02/06/2011 DATE OF DISCHARGE: 02/12/2011   DISCHARGE SUMMARY - REFERRING   PRIMARY CARE PHYSICIAN: Lynnell Chad. Shelia Media, M.D.  CONSULTING PHYSICIANS: Burnett Harry. Joya Gaskins, MD, FCCP  FINAL DIAGNOSES: 1. Healthcare-associated pneumonia with acute hypoxemia. 2. Obstructive sleep apnea, on CPAP. 3. History of prostate cancer. 4. Chronic kidney disease, stage III. 5. Chronically elevated diaphragm. 6. Normocytic anemia, probably anemia of critical illness  hemodilution. Will need followup outpatient.   10/08/2014 Pulmonary consultation/[name]   Chief Complaint  Patient presents with  . Advice Only    pulm nodules; no SOB, CP or cough  cp always present x 10 years whenever lies down on R side or pushes on R lat cw but is only mild and not produced by deep breath or cough   Not limited by breathing from desired activities    No obvious day to day or daytime variabilty or assoc chronic cough or  chest tightness, subjective wheeze overt sinus or hb symptoms. No unusual exp hx or h/o childhood pna/ asthma or knowledge of premature birth.  Sleeping ok without nocturnal  or early am exacerbation  of respiratory  c/o's or need for noct saba. Also denies any obvious fluctuation of symptoms with weather or environmental changes or other aggravating or alleviating factors except as outlined above   Current Medications, Allergies, Complete Past Medical History, Past Surgical History, Family History, and Social History were reviewed in  [employer] record.  ROS  The following are not active complaints unless bolded sore throat, dysphagia, dental problems, itching, sneezing,  nasal congestion or excess/ purulent secretions, ear ache,   fever, chills, sweats, unintended wt loss, classically pleuritic or exertional cp, hemoptysis,  orthopnea pnd or leg swelling, presyncope, palpitations, heartburn, abdominal pain, anorexia, nausea, vomiting, diarrhea  or change in bowel or urinary habits, change in stools or urine, dysuria,hematuria,  rash, arthralgias, visual complaints, headache, numbness weakness or ataxia or problems with walking or coordination,  change in mood/affect or memory.          Review of Systems     Objective:   Physical Exam amb wm nad  Wt Readings from Last 3 Encounters:  10/08/14 192 lb (87.091 kg)  09/10/14 196 lb (88.905 kg)  06/24/14 197 lb (89.359 kg)    Vital signs reviewed   HEENT: nl dentition, turbinates, and orophanx. Nl external ear canals without cough reflex   NECK :  without JVD/Nodes/TM/ nl carotid upstrokes bilaterally   LUNGS: no acc muscle use, subtle  inps crackles L base posterioroly    CV:  RRR  no s3 or murmur or increase in P2, no edema   ABD:  soft and nontender with nl excursion in the supine position. No bruits or organomegaly, bowel sounds nl  MS:  warm without deformities, calf tenderness, cyanosis or clubbing  SKIN: warm and dry without lesions    NEURO:  alert, approp, no deficits    I personally reviewed images and agree with radiology impression  as follows:   CT s contrast  09/23/14 Bilateral pneumonia and pleural effusion present in 2012 has resolved. 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 Associated elevation of the left hemidiaphragm. Mild scarring/ atelectasis in the right costophrenic angle. Major airways are patent. No right lung nodule. No left lung nodule.    Assessment:

## 2014-10-09 ENCOUNTER — Encounter: Payer: Self-pay | Admitting: Internal Medicine

## 2014-10-09 DIAGNOSIS — J479 Bronchiectasis, uncomplicated: Secondary | ICD-10-CM | POA: Insufficient documentation

## 2014-10-09 DIAGNOSIS — R0781 Pleurodynia: Secondary | ICD-10-CM | POA: Insufficient documentation

## 2014-10-09 NOTE — Assessment & Plan Note (Signed)
He does have some crackles in his L base on insp but otherwise really no symptoms or signs that would point to clinically significant bronchiectasis.  Most likely this is a result of the severe pna he had in 2012 as suggested by radiology  I reviewed the pathophysiology of bronchiectasis in detail with pt and wife using the escalator analogy and rec he keep up with his vaccinations for flu and pneumonia which should now include prevnar -13 but otherwise have no additional recs.  See instructions for specific recommendations which were reviewed directly with the patient who was given a copy with highlighter outlining the key components.

## 2014-10-09 NOTE — Assessment & Plan Note (Addendum)
See CT Chest 09/30/14 c/w 5th/6th healed rib fx on R   His hx goes back 10 years but does not include an injury that he or his wife remember but may be in his clinic record with Dr Shelia Media - but this is a moot issue as the ribs look to have healed normally but do still cause chest wall pain in the exact distribution as the rib abnormalities on CT   Certainly for new bony pain a bone scan may be considered as a more sensitive way to pick up met dz from known prostate ca but he assures me this is the same pain he's had for 10 years so i don't rec further studies at this point but would certainly defer the issue to Dr Pennie Banter capable hands.

## 2014-10-09 NOTE — Assessment & Plan Note (Signed)
Diaphragm fluoroscopy 7/9/122012 neg for paradox

## 2014-10-11 DIAGNOSIS — I2510 Atherosclerotic heart disease of native coronary artery without angina pectoris: Secondary | ICD-10-CM | POA: Diagnosis not present

## 2014-10-11 DIAGNOSIS — I10 Essential (primary) hypertension: Secondary | ICD-10-CM | POA: Diagnosis not present

## 2014-11-08 ENCOUNTER — Ambulatory Visit (INDEPENDENT_AMBULATORY_CARE_PROVIDER_SITE_OTHER): Payer: Medicare Other | Admitting: Adult Health

## 2014-11-08 ENCOUNTER — Encounter: Payer: Self-pay | Admitting: Adult Health

## 2014-11-08 VITALS — BP 124/66 | HR 66 | Temp 98.8°F | Ht 71.0 in | Wt 192.8 lb

## 2014-11-08 DIAGNOSIS — J479 Bronchiectasis, uncomplicated: Secondary | ICD-10-CM

## 2014-11-08 MED ORDER — AZITHROMYCIN 250 MG PO TABS: ORAL_TABLET | ORAL | Status: AC

## 2014-11-08 NOTE — Patient Instructions (Signed)
Mucinex DM Twice daily  As needed  Cough/congestion  Claritin 10mg  daily As needed  Drainage  Zpack to have on hold if symptoms worsen with discolored mucus.  Please contact office for sooner follow up if symptoms do not improve or worsen or seek emergency care

## 2014-11-08 NOTE — Progress Notes (Signed)
Subjective:     Patient ID: [name], male   DOB: 12/12/1945,    MRN: [id number]  HPI  10 yowm never smoker recurrent R flank pain x around 2005 made worse by lying down on R side with worst ever episode around early Feb 2016 > eval including cxr showing ? Nodules and CT c/w bronchiectasis so referred to pulmonary clinic 10/08/14  by Dr Deland Pretty to sort out.   Note previously seen by our service in 2012:  [place]: 02/06/2011 DATE OF DISCHARGE: 02/12/2011   DISCHARGE SUMMARY - REFERRING   PRIMARY CARE PHYSICIAN: Lynnell Chad. Shelia Media, M.D.  CONSULTING PHYSICIANS: Burnett Harry. Joya Gaskins, MD, FCCP  FINAL DIAGNOSES: 1. Healthcare-associated pneumonia with acute hypoxemia. 2. Obstructive sleep apnea, on CPAP. 3. History of prostate cancer. 4. Chronic kidney disease, stage III. 5. Chronically elevated diaphragm. 6. Normocytic anemia, probably anemia of critical illness  hemodilution. Will need followup outpatient.   10/08/2014 Pulmonary consultation/[name]   Chief Complaint  Patient presents with  . Advice Only    pulm nodules; no SOB, CP or cough  cp always present x 10 years whenever lies down on R side or pushes on R lat cw but is only mild and not produced by deep breath or cough  >>add mucinex dm As needed    11/08/2014 Acute OV  Pt complains of cough, tickle in throat and minimal congestion that is clear for last 3 days.  No significant nasal congestion or drainage .  No fever, discolored mucus, chest pain, hemoptysis, calf pain, or n/v/d.  Appetite is good.  No otc used for symptoms.  Leaving for [place] in couple of weeks with family , does not want to get sick.  No recent travel or abx use.  No family members sick.      Current Medications, Allergies, Complete Past Medical History, Past Surgical History, Family History, and Social History were reviewed in [employer] record.  ROS  The following are not  active complaints unless bolded sore throat, dysphagia, dental problems, itching, sneezing,  nasal congestion or excess/ purulent secretions, ear ache,   fever, chills, sweats, unintended wt loss, classically pleuritic or exertional cp, hemoptysis,  orthopnea pnd or leg swelling, presyncope, palpitations, heartburn, abdominal pain, anorexia, nausea, vomiting, diarrhea  or change in bowel or urinary habits, change in stools or urine, dysuria,hematuria,  rash, arthralgias, visual complaints, headache, numbness weakness or ataxia or problems with walking or coordination,  change in mood/affect or memory.          Review of Systems     Objective:   Physical Exam amb wm nad Vital signs reviewed   HEENT: nl dentition, turbinates, and orophanx. Nl external ear canals without cough reflex   NECK :  without JVD/Nodes/TM/ nl carotid upstrokes bilaterally   LUNGS: no acc muscle use, CTA w/ no wheezing or rales    CV:  RRR  no s3 or murmur or increase in P2, no edema   ABD:  soft and nontender with nl excursion in the supine position. No bruits or organomegaly, bowel sounds nl  MS:  warm without deformities, calf tenderness, cyanosis or clubbing  SKIN: warm and dry without lesions    NEURO:  alert, approp, no deficits      CT s contrast  09/23/14 Bilateral pneumonia and pleural effusion present in 2012 has resolved. 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 Associated elevation of the left hemidiaphragm. Mild  scarring/ atelectasis in the right costophrenic angle. Major airways are patent. No right lung nodule. No left lung nodule.    Assessment:

## 2014-11-08 NOTE — Assessment & Plan Note (Signed)
No flare noted  ? URI /AR developing -tx symptoms   Plan  Mucinex DM Twice daily  As needed  Cough/congestion  Claritin 10mg  daily As needed  Drainage  Zpack to have on hold if symptoms worsen with discolored mucus.  Please contact office for sooner follow up if symptoms do not improve or worsen or seek emergency care

## 2014-11-18 ENCOUNTER — Telehealth: Payer: Self-pay | Admitting: Internal Medicine

## 2014-11-18 NOTE — Telephone Encounter (Signed)
Pt c/o dry cough x 1 week. Denies mucus production, CP/tightness, wheezing and SOB. Has not tried anything OTC for cough suppression. Pt is leaving to go out of town Sunday morning (11/21/14) and is requesting something be called in.  Allergies  Allergen Reactions  . Codeine      nausea  . Penicillins     Swelling, rash  . Sulfa Antibiotics    Please advise Dr Elsworth Soho. Thanks.

## 2014-11-18 NOTE — Telephone Encounter (Signed)
MW pt 

## 2014-11-18 NOTE — Telephone Encounter (Signed)
Called pt and appt scheduled to see [facility] tomorrow at 10:30

## 2014-11-18 NOTE — Telephone Encounter (Signed)
[name] already rec mucinex dm 1200 mg every 12 hours and is the strongest non-narcotic made so if still coughing we can offer to see him tomorrow or try norco 5 one every 4 hours to supplement the mucinex dm #30

## 2014-11-19 ENCOUNTER — Ambulatory Visit (INDEPENDENT_AMBULATORY_CARE_PROVIDER_SITE_OTHER): Payer: Medicare Other | Admitting: Pulmonary Disease

## 2014-11-19 ENCOUNTER — Encounter: Payer: Self-pay | Admitting: Pulmonary Disease

## 2014-11-19 ENCOUNTER — Ambulatory Visit (INDEPENDENT_AMBULATORY_CARE_PROVIDER_SITE_OTHER)
Admission: RE | Admit: 2014-11-19 | Discharge: 2014-11-19 | Disposition: A | Discharge status: Home / Self Care | Payer: Medicare Other | Source: Ambulatory Visit | Attending: Pulmonary Disease | Admitting: Pulmonary Disease

## 2014-11-19 VITALS — BP 144/70 | HR 64 | Temp 98.5°F | Ht 71.0 in | Wt 192.6 lb

## 2014-11-19 DIAGNOSIS — R05 Cough: Secondary | ICD-10-CM | POA: Diagnosis not present

## 2014-11-19 DIAGNOSIS — J479 Bronchiectasis, uncomplicated: Secondary | ICD-10-CM | POA: Diagnosis not present

## 2014-11-19 MED ORDER — HYDROCODONE-HOMATROPINE 5-1.5 MG/5ML PO SYRP
5.0000 mL | ORAL_SOLUTION | Freq: Four times a day (QID) | ORAL | Status: DC | PRN
Start: 2014-11-19 — End: 2014-12-14

## 2014-11-19 NOTE — Patient Instructions (Signed)
Will check a chest xray today for completeness, and call you with results. Finish up zpak now that you have started. Continue mucinex as needed. Will give you a prescription for cough syrup to use as needed.

## 2014-11-19 NOTE — Progress Notes (Signed)
   Subjective:    Patient ID: [name], male    DOB: 22-Nov-1945, 69 y.o.   MRN: [id number]  HPI The patient comes in today for an acute sick visit. He has a known history of bronchiectasis, and was recently seen by our nurse practitioner 10 days ago for cough and congestion. He was given a prescription for a Z-Pak to hold, but not felt to have an infection at that time. Patient tells me that he has developed malaise, but does not have any fever, congestion, mucus production, or shortness of breath. He did start on the Z-Pak, but has not made a difference to his cough. He has not had a recent chest x-ray.   Review of Systems  Constitutional: Negative for fever and unexpected weight change.  HENT: Negative for congestion, dental problem, ear pain, nosebleeds, postnasal drip, rhinorrhea, sinus pressure, sneezing, sore throat and trouble swallowing.   Eyes: Negative for redness and itching.  Respiratory: Positive for cough. Negative for chest tightness, shortness of breath and wheezing.   Cardiovascular: Negative for palpitations and leg swelling.  Gastrointestinal: Negative for nausea and vomiting.  Genitourinary: Negative for dysuria.  Musculoskeletal: Negative for joint swelling.  Skin: Negative for rash.  Neurological: Negative for headaches.  Hematological: Does not bruise/bleed easily.  Psychiatric/Behavioral: Negative for dysphoric mood. The patient is not nervous/anxious.        Objective:   Physical Exam Overweight male in no acute distress Nose without purulence or discharge noted Neck without lymphadenopathy or thyromegaly Chest with very faint bibasilar crackles, no wheezes and excellent air flow Cardiac exam with regular rate and rhythm Lower extremities with no significant edema, no cyanosis Alert and oriented, moves all 4 extremities.       Assessment & Plan:

## 2014-11-19 NOTE — Assessment & Plan Note (Signed)
The patient comes in today with a persistent cough, but has no congestion, no mucus, and no fever. He does have malaise, and states that he "feels bad". He has no shortness of breath. His lung exam shows minimal bibasilar crackles, but no wheezes or obvious evidence for pneumonia. I will check a chest x-ray for completeness, but would like to primarily focus on symptom control. I have prescribed a narcotic-containing cough syrup to help with cough, but the patient tells me that he is intolerant of codeine with GI side effects. He has never taken hydrocodone, but is willing to try to see if he can tolerate. He understands to call us if he feels that his symptoms are getting worse.

## 2014-11-30 ENCOUNTER — Encounter: Payer: Self-pay | Admitting: Adult Health

## 2014-11-30 ENCOUNTER — Ambulatory Visit (INDEPENDENT_AMBULATORY_CARE_PROVIDER_SITE_OTHER): Payer: Medicare Other | Admitting: Adult Health

## 2014-11-30 VITALS — BP 142/62 | HR 71 | Temp 98.7°F | Ht 71.0 in | Wt 198.6 lb

## 2014-11-30 DIAGNOSIS — J471 Bronchiectasis with (acute) exacerbation: Secondary | ICD-10-CM

## 2014-11-30 MED ORDER — LEVOFLOXACIN 500 MG PO TABS: 500.0000 mg | ORAL_TABLET | Freq: Every day | ORAL | Status: AC

## 2014-11-30 MED ORDER — PREDNISONE 10 MG PO TABS: ORAL_TABLET | ORAL | Status: DC

## 2014-11-30 MED ORDER — LEVALBUTEROL HCL 0.63 MG/3ML IN NEBU
0.6300 mg | INHALATION_SOLUTION | Freq: Once | RESPIRATORY_TRACT | Status: AC
  Administered 2014-11-30: 0.63 mg via RESPIRATORY_TRACT

## 2014-11-30 NOTE — Progress Notes (Signed)
Subjective:     Patient ID: [name], male   DOB: July 17, 1946,    MRN: [id number]  HPI  3 yowm never smoker recurrent R flank pain x around 2005 made worse by lying down on R side with worst ever episode around early Feb 2016 > eval including cxr showing ? Nodules and CT c/w bronchiectasis so referred to pulmonary clinic 10/08/14  by Dr Deland Pretty to sort out.   Note previously seen by our service in 2012:  [name]: 02/06/2011 DATE OF DISCHARGE: 02/12/2011   DISCHARGE SUMMARY - REFERRING   PRIMARY CARE PHYSICIAN: [name]. Shelia [name], M.D.  CONSULTING PHYSICIANS: [name]. Glenn Gaskins, MD, FCCP  FINAL DIAGNOSES: 1. Healthcare-associated pneumonia with acute hypoxemia. 2. Obstructive sleep apnea, on CPAP. 3. History of prostate cancer. 4. Chronic kidney disease, stage III. 5. Chronically elevated diaphragm. 6. Normocytic anemia, probably anemia of critical illness  hemodilution. Will need followup outpatient.   10/08/2014 Pulmonary consultation/[name]   Chief Complaint  Patient presents with  . Advice Only    pulm nodules; no SOB, CP or cough  cp always present x 10 years whenever lies down on R side or pushes on R lat cw but is only mild and not produced by deep breath or cough  >>add mucinex dm As needed    11/08/2014 Acute OV  Pt complains of cough, tickle in throat and minimal congestion that is clear for last 3 days.  No significant nasal congestion or drainage .  No fever, discolored mucus, chest pain, hemoptysis, calf pain, or n/v/d.  Appetite is good.  No otc used for symptoms.  Leaving for [place] in couple of weeks with family , does not want to get sick.  No recent travel or abx use.  No family members sick.  >zpack   11/30/2014 Acute OV  Patient returns with no significant improvement in symptoms He complains of persistent cough that has been worse for the last 3-4 months. He complains of a rattling congested cough with  thick mucus. Patient was given some Hydromet cough syrup which has helped slightly with his cough.Marland Kitchen He finished a Z-Pak. He has not seen any significant improvement. Chest x-ray done last week with no signs of pneumonia. He denies any hemoptysis, orthopnea, PND or leg swelling Patient complains that he's had wheezing over the last 3 or 4 days.      Current Medications, Allergies, Complete Past Medical History, Past Surgical History, Family History, and Social History were reviewed in [employer] record.  ROS  The following are not active complaints unless bolded sore throat, dysphagia, dental problems, itching, sneezing,  nasal congestion or excess/ purulent secretions, ear ache,   fever, chills, sweats, unintended wt loss, classically pleuritic or exertional cp, hemoptysis,  orthopnea pnd or leg swelling, presyncope, palpitations, heartburn, abdominal pain, anorexia, nausea, vomiting, diarrhea  or change in bowel or urinary habits, change in stools or urine, dysuria,hematuria,  rash, arthralgias, visual complaints, headache, numbness weakness or ataxia or problems with walking or coordination,  change in mood/affect or memory.              Objective:   Physical Exam amb wm nad Vital signs reviewed   HEENT: nl dentition, turbinates, and orophanx. Nl external ear canals without cough reflex   NECK :  without JVD/Nodes/TM/ nl carotid upstrokes bilaterally   LUNGS: no acc muscle use, CTA w/ no wheezing or rales    CV:  RRR  no s3 or murmur  or increase in P2, no edema   ABD:  soft and nontender with nl excursion in the supine position. No bruits or organomegaly, bowel sounds nl  MS:  warm without deformities, calf tenderness, cyanosis or clubbing  SKIN: warm and dry without lesions    NEURO:  alert, approp, no deficits      CT s contrast  09/23/14 Bilateral pneumonia and pleural effusion present in 2012 has resolved. 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 Associated elevation of the left hemidiaphragm. Mild scarring/ atelectasis in the right costophrenic angle. Major airways are patent. No right lung nodule. No left lung nodule.  CXR 11/19/14  Reviewed independently   Chronic changes  Assessment:

## 2014-11-30 NOTE — Patient Instructions (Signed)
Levaquin 500mg  daily for 7 days  Prednisone taper over next week  Mucinex DM Twice daily  As needed  Cough/congestion  follow up Dr. Melvyn Novas  In 2-3 weeks and As needed   Please contact office for sooner follow up if symptoms do not improve or worsen or seek emergency care

## 2014-11-30 NOTE — Assessment & Plan Note (Signed)
Slow to resolve exacerbation Xopenex nebulizer, given the office  Plan  Levaquin 500mg  daily for 7 days  Prednisone taper over next week  Mucinex DM Twice daily  As needed  Cough/congestion  follow up Dr. Melvyn Novas  In 2-3 weeks and As needed   Please contact office for sooner follow up if symptoms do not improve or worsen or seek emergency care

## 2014-11-30 NOTE — Addendum Note (Signed)
Addended by: Parke Poisson E on: 11/30/2014 04:01 PM   Modules accepted: Orders

## 2014-12-13 ENCOUNTER — Telehealth: Payer: Self-pay | Admitting: Neurology

## 2014-12-13 NOTE — Telephone Encounter (Signed)
Patient called and stated that he has been experiencing some symptoms such as balance issues and pain in both legs. He would like to speak with someone regarding these issues and possibly coming in to see the Doctor. Please call and advise.

## 2014-12-14 ENCOUNTER — Ambulatory Visit (INDEPENDENT_AMBULATORY_CARE_PROVIDER_SITE_OTHER): Payer: Medicare Other | Admitting: Neurology

## 2014-12-14 ENCOUNTER — Encounter: Payer: Self-pay | Admitting: Neurology

## 2014-12-14 VITALS — BP 156/72 | HR 74 | Resp 14 | Ht 71.0 in | Wt 180.4 lb

## 2014-12-14 DIAGNOSIS — G250 Essential tremor: Secondary | ICD-10-CM

## 2014-12-14 DIAGNOSIS — R29898 Other symptoms and signs involving the musculoskeletal system: Secondary | ICD-10-CM | POA: Insufficient documentation

## 2014-12-14 DIAGNOSIS — G629 Polyneuropathy, unspecified: Secondary | ICD-10-CM | POA: Diagnosis not present

## 2014-12-14 DIAGNOSIS — R292 Abnormal reflex: Secondary | ICD-10-CM | POA: Diagnosis not present

## 2014-12-14 DIAGNOSIS — G4733 Obstructive sleep apnea (adult) (pediatric): Secondary | ICD-10-CM

## 2014-12-14 NOTE — Progress Notes (Signed)
[facility]  PATIENT: [name] DOB: 10-27-1945  REFERRING CLINICIAN: Deland Pretty HISTORY FROM: Patient REASON FOR VISIT: Polyneuropathy and tremors   HISTORICAL  CHIEF COMPLAINT:  Chief Complaint  Patient presents with  . Sleep Apnea    Sts. he is compliant with cpap--has no question/concerns regarding osa/cpap.   . Peripheral Neuropathy    Today he c/o increased aching bilat lower legs--sts. this throws his balance off.  Sts. he thinks this is related to neuropathy./fim    HISTORY OF PRESENT ILLNESS:  [name] is a 69 year old man noting more weakness and worsening gait over the past 10 days.   On 12/03/2014, he noted pain in the left calf.   A few days later, he began to have similar discomfort in the right calf.    His balance seems worse but he has not fallen.  His legs feel a little weaker.   Tremors seem worse.    He feels the numbness is about the same in his legs as before.   He denies any cramping sensation elsewhere.   No bowel or bladder change.   He feels the cramping has is about the same now as last week.    He can not think of any activity the previous day or two that may have triggered the event.   However, he has had a bad URI and is on prednisone, hydrocodone, Allegra and Mucinex (was on ed's x one week when he felt worse).     He takes Zocor but has been on it at the same dose x several years.    He feels the urine is darker.       POLYNEUROPATHY:  He has a long history of polyneuropathy that became symptomatic in 1996 and was diagnosed in 2001 at [facility] after Nerve conduction and EMG testing .  These tests were repeated at [facility] in 2006 but we do not have those results at this time. From the neuropathy he has reduced balance and often needs to hold on for support. He stumbles frequently. He has not had any recent falls. Climbing bleachers is very difficult. He has a dysesthetic sensation with a burning pain that is worse in the feet  and calves but goes up to the posterior thighs. Walking or sitting certain ways will increase the pain.    His feet always feel cold. His symptoms are worse at night. Lamotrigine 300 mg by mouth twice a day has helped the pain the most.   Heat also helps. Neurontin and a tricyclic antidepressant did not help the pain ad gababentin was not tolerated. Blood work including B12, cryoglobulins and electrophoresis had been essentially normal in the past, last tested in 2013.  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 The tremor is worse when he is upset or anxious. He feels the tremor has worsened over the past 2 weeks since his symptoms have started.   He he has noted the tremor more when he each or when he puts in his contact lenses.     He takes atenolol for his heart.  In the past, we tried Mysoline but even at a low dose he felt very sleepy and needed to stop.      OSA:   He has sleep apnea and uses CPAP nightly. He feels more refreshed during the day and is less likely to doze off on CPAP that he was before he started using it. He no  longer needs nocturnal oxygen.   He denies daytime sleepiness.         REVIEW OF SYSTEMS:  Constitutional: No fevers, chills, sweats, or change in appetite Eyes: No visual changes, double vision, eye pain Ear, nose and throat: No hearing loss, ear pain, nasal congestion, sore throat Cardiovascular: No chest pain, palpitations Respiratory:  No shortness of breath at rest or with exertion.   No wheezes GastrointestinaI: No nausea, vomiting, diarrhea, abdominal pain, fecal incontinence Genitourinary:  No dysuria, urinary retention or frequency.  No nocturia. Musculoskeletal:  No neck pain, back pain Integumentary: No rash, pruritus, skin lesions Neurological: as above Psychiatric: No depression at this time.  No anxiety Endocrine: No palpitations, diaphoresis, change in appetite, change in weigh or increased thirst Hematologic/Lymphatic:   No anemia, purpura, petechiae. Allergic/Immunologic: No itchy/runny eyes, nasal congestion, recent allergic reactions, rashes  ALLERGIES: Allergies  Allergen Reactions  . Codeine      nausea  . Penicillins     Swelling, rash  . Sulfa Antibiotics     HOME MEDICATIONS: Outpatient Prescriptions Prior to Visit  Medication Sig Dispense Refill  . allopurinol (ZYLOPRIM) 100 MG tablet Take 100 mg by mouth daily.      Marland Kitchen aspirin 81 MG tablet Take 81 mg by mouth daily.      Marland Kitchen atenolol (TENORMIN) 50 MG tablet Take 50 mg by mouth 2 (two) times daily.     . Cholecalciferol (VITAMIN D3) 1000 UNITS tablet Take 1,000 Units by mouth daily.      Marland Kitchen dextromethorphan-guaiFENesin (MUCINEX DM) 30-600 MG per 12 hr tablet Take 1 tablet by mouth 2 (two) times daily.    . felodipine (PLENDIL) 5 MG 24 hr tablet Take 5 mg by mouth daily.      . fexofenadine (ALLEGRA) 180 MG tablet Take 180 mg by mouth daily.    . Fiber CAPS Take 1 capsule by mouth daily.      Marland Kitchen lamoTRIgine (LAMICTAL) 150 MG tablet Take 2 tablets (300 mg total) by mouth 2 (two) times daily. 120 tablet 11  . meloxicam (MOBIC) 15 MG tablet Take 15 mg by mouth.    . minoxidil (LONITEN) 10 MG tablet Take 40 mg by mouth daily.     . Multiple Vitamin (MULTIVITAMIN) tablet Take 1 tablet by mouth daily.      . prednisoLONE acetate (PRED FORTE) 1 % ophthalmic suspension Place 1 drop into the left eye at bedtime.     . predniSONE (DELTASONE) 10 MG tablet 4 tabs for 2 days, then 3 tabs for 2 days, 2 tabs for 2 days, then 1 tab for 2 days, then stop 20 tablet 0  . simvastatin (ZOCOR) 20 MG tablet Take 20 mg by mouth daily.    Marland Kitchen zolpidem (AMBIEN) 10 MG tablet Take 10 mg by mouth at bedtime as needed.      Marland Kitchen HYDROcodone-homatropine (HYCODAN) 5-1.5 MG/5ML syrup Take 5 mLs by mouth every 6 (six) hours as needed for cough. 120 mL 0   No facility-administered medications prior to visit.    PAST MEDICAL HISTORY: Past Medical History  Diagnosis Date  .  Pneumonia   . OSA (obstructive sleep apnea)   . History of prostate cancer Feb 1993  . Elevated diaphragm   . Normocytic anemia   . Hypoxemia   . Hypertension   . Gout   . Peripheral neuropathy   . Hyperlipidemia   . Cancer   . Vision abnormalities     PAST SURGICAL HISTORY: Past  Surgical History  Procedure Laterality Date  . Appendectomy    . Prostatectomy    . Shoulder surgery      bone spur removed from right    FAMILY HISTORY: Family History  Problem Relation Age of Onset  . Emphysema Father   . Emphysema Brother   . Asthma Daughter   . Congestive Heart Failure Mother     SOCIAL HISTORY:  History   Social History  . Marital Status: Married    Spouse Name: N/A  . Number of Children: 2  . Years of Education: N/A   Occupational History  . Retired     Company secretary   Social History Main Topics  . Smoking status: Never Smoker   . Smokeless tobacco: Never Used  . Alcohol Use: Yes     Comment: occasionally  . Drug Use: No  . Sexual Activity: Not on file   Other Topics Concern  . Not on file   Social History Narrative     PHYSICAL EXAM  Filed Vitals:   12/14/14 1531  BP: 156/72  Pulse: 74  Resp: 14  Height: 5\' 11"  (1.803 m)  Weight: 180 lb 6.4 oz (81.829 kg)    Body mass index is 25.17 kg/(m^2).   General: The patient is well-developed and well-nourished and in no acute distress   Neurologic Exam  Mental status: The patient is alert and oriented x 3 at the time of the examination. The patient has apparent normal recent and remote memory, with an apparently normal attention span and concentration ability.   Speech is normal.  Cranial nerves: Extraocular movements are full. The left pupil is larger than the right and has reduced reactivity. Visual acuity is reduced out of the left eye..  Facial symmetry is present. There is good facial sensation to soft touch bilaterally.Facial strength is normal.  Trapezius and sternocleidomastoid strength is  normal. No dysarthria is noted.  The tongue is midline, and the patient has symmetric elevation of the soft palate.   Motor:  Muscle bulk is reduecd in the calves.   Muscle tone is normal. Strength is  5 / 5 in all 4 extremities.  He is able to squat and rise. He can get out of a chair 5 times without using his arms. He has difficulty walking on his heels or toes.  Sensory: Sensory testing is intact to pinprick, soft touch, vibration sensation, and position sense in the arms but he has reduced vibration sensation in the upper toes and mildly at the ankles compared to the knees. He has a gradient of temperature sensation in the shin. As decreased touch sensation in his toes  and foot.   Coordination: Cerebellar testing reveals good finger-nose-finger and mildly reduced heel-to-shin bilaterally.  Gait and station: Station is stable with the eyes open but unstable with his eyes closed.  His gait is better when he looks down at his feet as he walks. He is unable to do a tandem walk when he looks up or with his eyes closed but is able to do tandem walk if he looks at his feet.  Reflexes: Deep tendon reflexes are increased with spread at the knees and 2 beats clonus at the ankles.   Plantar responses are normal.    DIAGNOSTIC DATA (LABS, IMAGING, TESTING) - I reviewed patient records, labs, notes, testing and imaging myself where available.  Lab Results  Component Value Date   WBC 6.7 09/08/2011   HGB 13.4 09/08/2011   HCT 38.0* 09/08/2011  MCV 86.8 09/08/2011   PLT 136* 09/08/2011      Component Value Date/Time   NA 141 09/08/2011 1505   K 3.9 09/08/2011 1505   CL 101 09/08/2011 1505   CO2 28 09/08/2011 1505   GLUCOSE 106* 09/08/2011 1505   BUN 15 09/08/2011 1505   CREATININE 1.10 09/08/2011 1505   CALCIUM 10.5 09/08/2011 1505   PROT 7.9 09/08/2011 1505   ALBUMIN 5.2 09/08/2011 1505   AST 16 09/08/2011 1505   ALT 12 09/08/2011 1505   ALKPHOS 91 09/08/2011 1505   BILITOT 0.9  09/08/2011 1505   GFRNONAA 68* 09/08/2011 1505   GFRAA 78* 09/08/2011 1505   Lab Results  Component Value Date   CHOL 126 01/20/2011   HDL 51 01/20/2011   LDLCALC 63 01/20/2011   TRIG 60 01/20/2011   CHOLHDL 2.5 01/20/2011   No results found for: HGBA1C Lab Results  Component Value Date   VITAMINB12 1234* 02/08/2011   Lab Results  Component Value Date   TSH 2.255 02/08/2011        ASSESSMENT AND PLAN  Polyneuropathy - Plan: Vitamin B12, NCV with EMG(electromyography)  Essential tremor  OSA (obstructive sleep apnea)  Hyperreflexia - Plan: Vitamin B12, MR Cervical Spine Wo Contrast, Copper, serum, NCV with EMG(electromyography)  Weakness of both lower extremities - Plan: Vitamin B12, CK, MR Cervical Spine Wo Contrast, Copper, serum, NCV with EMG(electromyography)   [name] is presenting with worsening gait, calf pain atrophy of the calf muscles and hyperreflexia, all superimposed on a long-term idiopathic polyneuropathy. I am concerned about the presence of upper motor neuron signs and we'll check the spine MRI, B12, copper, NCV/EMG. We need to rule out cervical myelopathy, motor neuron disease and a metabolic etiology.   I'll also check a creatinine kinase as he has calf pain and has noted that his urine is darker.  We will call him with the results of the study. I will see him back for a regular visit a few weeks after the studies are done. He is to call us sooner if he has new or worsening neurologic symptoms.  45 minute face-to-face visit with greater than one half of the time counseling and coordinating care about his new neurologic symptoms, differential diagnosis and evaluation.  Javeah Loeza A. Felecia Shelling, MD, PhD 9/39/0300, 9:23 PM Certified in Neurology, Clinical Neurophysiology, Sleep Medicine, Pain Medicine and Neuroimaging  [facility] [address] [place], [place] [address] [phone]

## 2014-12-14 NOTE — Telephone Encounter (Signed)
I have spoken with [name] this am who c/o bilat calf pain off/on for the last 1-2 weeks.  He has a hx. of peripheral neuropathy and thinks pain is r/t this.  Appt. given for 1520 this pm/fim

## 2014-12-15 ENCOUNTER — Encounter: Payer: Self-pay | Admitting: Internal Medicine

## 2014-12-15 ENCOUNTER — Ambulatory Visit (INDEPENDENT_AMBULATORY_CARE_PROVIDER_SITE_OTHER): Payer: Medicare Other | Admitting: Internal Medicine

## 2014-12-15 VITALS — BP 140/70 | HR 68 | Ht 71.0 in | Wt 189.2 lb

## 2014-12-15 DIAGNOSIS — I10 Essential (primary) hypertension: Secondary | ICD-10-CM | POA: Diagnosis not present

## 2014-12-15 DIAGNOSIS — J479 Bronchiectasis, uncomplicated: Secondary | ICD-10-CM | POA: Diagnosis not present

## 2014-12-15 MED ORDER — FAMOTIDINE 20 MG PO TABS: ORAL_TABLET | ORAL | Status: DC

## 2014-12-15 MED ORDER — PANTOPRAZOLE SODIUM 40 MG PO TBEC: 40.0000 mg | DELAYED_RELEASE_TABLET | Freq: Every day | ORAL | Status: DC

## 2014-12-15 MED ORDER — BISOPROLOL FUMARATE 5 MG PO TABS: 5.0000 mg | ORAL_TABLET | Freq: Every day | ORAL | Status: DC

## 2014-12-15 NOTE — Progress Notes (Signed)
Subjective:     Patient ID: [name], male   DOB: [birth year]    MRN: [id number]    Brief patient profile:  82 yowm never smoker recurrent R flank pain x around 2005 made worse by lying down on R side with worst ever episode around early Feb 2016 > eval including cxr showing ? Nodules and CT c/w bronchiectasis so referred to pulmonary clinic 10/08/14  by [name] to sort out.   Note previously seen by our service in 2012:  DATE OF ADMISSION: 02/06/2011 DATE OF DISCHARGE: 02/12/2011  FINAL DIAGNOSES: 1. Healthcare-associated pneumonia with acute hypoxemia. 2. Obstructive sleep apnea, on CPAP. 3. History of prostate cancer. 4. Chronic kidney disease, stage III. 5. Chronically elevated diaphragm. 6. Normocytic anemia, probably anemia of critical illness  hemodilution. Will need followup outpatient.   10/08/2014 Pulmonary consultation/[name]   Chief Complaint  Patient presents with  . Advice Only    pulm nodules; no SOB, CP or cough  cp always present x 10 years whenever lies down on R side or pushes on R lat cw but is only mild and not produced by deep breath or cough  >>add mucinex dm As needed    11/08/2014 Acute OV  Pt complains of cough, tickle in throat and minimal congestion that is clear for last 3 days.  No significant nasal congestion or drainage .  No fever, discolored mucus, chest pain, hemoptysis, calf pain, or n/v/d.  Appetite is good.  No otc used for symptoms.  Leaving for [place] in couple of weeks with family , does not want to get sick.  No recent travel or abx use.  No family members sick.  >zpack   11/30/2014 Acute OV  Patient returns with no significant improvement in symptoms He complains of persistent cough that has been worse for the last 3-4 months. He complains of a rattling congested cough with thick mucus. Patient was given some Hydromet cough syrup which has helped slightly with his cough.Marland Kitchen He finished a Z-Pak. He has not seen any  significant improvement. Chest x-ray done last week with no signs of pneumonia. He denies any hemoptysis, orthopnea, PND or leg swelling Patient complains that he's had wheezing over the last 3 or 4 days. rec Levaquin 500mg  daily for 7 days  Prednisone taper over next week  Mucinex DM Twice daily  As needed  Cough/congestion    12/15/2014 f/u ov/[name] re: bronchiectasis  Chief Complaint  Patient presents with  . Follow-up    Pt states he feels better in general since last visit, but still coughing- sputum is now clear. He has not had any more wheezing.    Cough is more day than night and now more dry than wet but no purulent or bloody mucus  Not limited by breathing from desired activities     No obvious day to day or daytime variabilty or assoc chronic cough or cp or chest tightness, subjective wheeze overt sinus or hb symptoms. No unusual exp hx or h/o childhood pna/ asthma or knowledge of premature birth.  Sleeping ok without nocturnal  or early am exacerbation  of respiratory  c/o's or need for noct saba. Also denies any obvious fluctuation of symptoms with weather or environmental changes or other aggravating or alleviating factors except as outlined above   Current Medications, Allergies, Complete Past Medical History, Past Surgical History, Family History, and Social History were reviewed in [employer] record.  ROS  The following are not  active complaints unless bolded sore throat, dysphagia, dental problems, itching, sneezing,  nasal congestion or excess/ purulent secretions, ear ache,   fever, chills, sweats, unintended wt loss, pleuritic or exertional cp, hemoptysis,  orthopnea pnd or leg swelling, presyncope, palpitations, heartburn, abdominal pain, anorexia, nausea, vomiting, diarrhea  or change in bowel or urinary habits, change in stools or urine, dysuria,hematuria,  rash, arthralgias, visual complaints, headache, numbness weakness or ataxia or problems  with walking or coordination,  change in mood/affect or memory.                           Objective:   Physical Exam amb wm nad   Wt Readings from Last 3 Encounters:  12/15/14 189 lb 3.2 oz (85.821 kg)  12/14/14 180 lb 6.4 oz (81.829 kg)  11/30/14 198 lb 9.6 oz (90.084 kg)    Vital signs reviewed      HEENT: nl dentition, turbinates, and orophanx. Nl external ear canals without cough reflex   NECK :  without JVD/Nodes/TM/ nl carotid upstrokes bilaterally   LUNGS: no acc muscle use, CTA w/ no wheezing or rales    CV:  RRR  no s3 or murmur or increase in P2, no edema   ABD:  soft and nontender with nl excursion in the supine position. No bruits or organomegaly, bowel sounds nl  MS:  warm without deformities, calf tenderness, cyanosis or clubbing  SKIN: warm and dry without lesions    NEURO:  alert, approp, no deficits      CT s contrast  09/23/14 Bilateral pneumonia and pleural effusion present in 2012 has resolved. 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 Associated elevation of the left hemidiaphragm. Mild scarring/ atelectasis in the right costophrenic angle. Major airways are patent. No right lung nodule. No left lung nodule.  CXR 11/19/14  Reviewed independently   Chronic changes  Assessment:

## 2014-12-15 NOTE — Patient Instructions (Addendum)
Bronchiectasis =   you have scarring of your bronchial tubes which means that they don't function perfectly normally and mucus tends to pool in certain areas of your lung which can cause pneumonia and further scarring of your lung and bronchial tubes  Whenever you develop cough congestion take mucinex or mucinex dm > these will help keep the mucus loose and flowing but if your condition worsens you need to seek help immediately preferably here or somewhere inside the [facility] system to compare xrays ( worse = darker or bloody mucus or pain on breathing in)    Pantoprazole (protonix) 40 mg   Take 30-60 min before first meal of the day and Pepcid 20 mg one bedtime until return to office - this is the best way to tell whether stomach acid is contributing to your problem.    GERD (REFLUX)  is an extremely common cause of respiratory symptoms just like yours , many times with no obvious heartburn at all.    It can be treated with medication, but also with lifestyle changes including avoidance of late meals, excessive alcohol, smoking cessation, and avoid fatty foods, chocolate, peppermint, colas, red wine, and acidic juices such as orange juice.  NO MINT OR MENTHOL PRODUCTS SO NO COUGH DROPS  USE SUGARLESS CANDY INSTEAD (Jolley ranchers or Stover's or Life Savers) or even ice chips will also do - the key is to swallow to prevent all throat clearing. NO OIL BASED VITAMINS - use powdered substitutes.    Stop tenormin   Bisoprolol 5 mg twice dialy   Please schedule a follow up office visit in 6 weeks, call sooner if needed with pfts with Dr Elsworth Soho  Melvyn Novas can see if needed)

## 2014-12-16 ENCOUNTER — Encounter: Payer: Self-pay | Admitting: Internal Medicine

## 2014-12-16 LAB — VITAMIN B12: VITAMIN B 12: 683 pg/mL (ref 211–946)

## 2014-12-16 LAB — CK: CK TOTAL: 38 U/L (ref 24–204)

## 2014-12-16 LAB — COPPER, SERUM: COPPER: 180 ug/dL — AB (ref 72–166)

## 2014-12-16 NOTE — Assessment & Plan Note (Addendum)
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  Please see instructions for details which were reviewed in writing and the patient given a copy.    6)  F/u in 6 weeks with pfts

## 2014-12-22 ENCOUNTER — Ambulatory Visit
Admission: RE | Admit: 2014-12-22 | Discharge: 2014-12-22 | Disposition: A | Discharge status: Home / Self Care | Payer: Medicare Other | Source: Ambulatory Visit | Attending: Neurology | Admitting: Neurology

## 2014-12-22 DIAGNOSIS — R292 Abnormal reflex: Secondary | ICD-10-CM | POA: Diagnosis not present

## 2014-12-22 DIAGNOSIS — R29898 Other symptoms and signs involving the musculoskeletal system: Secondary | ICD-10-CM | POA: Diagnosis not present

## 2014-12-27 ENCOUNTER — Ambulatory Visit (INDEPENDENT_AMBULATORY_CARE_PROVIDER_SITE_OTHER): Payer: Self-pay | Admitting: Neurology

## 2014-12-27 ENCOUNTER — Ambulatory Visit (INDEPENDENT_AMBULATORY_CARE_PROVIDER_SITE_OTHER): Payer: Medicare Other | Admitting: Neurology

## 2014-12-27 ENCOUNTER — Telehealth: Payer: Self-pay | Admitting: *Deleted

## 2014-12-27 DIAGNOSIS — G629 Polyneuropathy, unspecified: Secondary | ICD-10-CM | POA: Diagnosis not present

## 2014-12-27 DIAGNOSIS — R29898 Other symptoms and signs involving the musculoskeletal system: Secondary | ICD-10-CM

## 2014-12-27 DIAGNOSIS — R292 Abnormal reflex: Secondary | ICD-10-CM

## 2014-12-27 DIAGNOSIS — Z0289 Encounter for other administrative examinations: Secondary | ICD-10-CM

## 2014-12-27 NOTE — Telephone Encounter (Signed)
LMTC./fim 

## 2014-12-27 NOTE — Telephone Encounter (Signed)
-----   Message from Britt Bottom, MD sent at 12/24/2014  9:21 AM EDT ----- MRI is essentially normal --  shows mild arthritis which is expected at age 69.   No nerve root compression.   Spinal cord is normal

## 2014-12-29 NOTE — Progress Notes (Signed)
  [facility]   Provider:  Dr Jaynee Eagles Referring Provider: Deland Pretty, MD Primary Care Physician:  Horatio Pel, MD  HPI:  [name] is a 69 y.o. male here for evaluation of neuropathy. He endorses numbness in the legs, tremors, poor balance, cold feet. He stumbles frequently but denies falls.  Summary  Nerve conduction studies were performed on the right upper and bilateral lower extremities:  Right APB Median motor nerve showed normal conductions with normal F Wave latency Right ADM Ulnar motor nerve showed normal conductions with normal F Wave latency Bilateral Peroneal motor nerves showed normal conductions with normal F Wave latencies Bilateral Tibial motor nerves showed normal conductions with normal F Wave latencies Right second-digit Median sensory nerve conduction was within normal limits Right fifth-digit Ulnar sensory nerve conduction was within normal limits Right Radial sensory nerve conduction was within normal limits Bilateral Sural sensory nerve conductions were within normal limits Bilateral H Reflexes have normal latencies if age and limb length are taken into account.  EMG Needle study was performed on selected right upper and right lower extremity muscles:   The Deltoid, Biceps, Triceps, Pronator Teres, Opponens Pollicis, First Dorsal interosseous, Vastus Medialis, Anterior Tibialis, Medial Gastrocnemius were within normal limits. The Extensor Hallucis Longus showed polyphasic motor units, prolonged motor unit duration and diminished motor unit recruitment. The Abductor Hallucis muscle showed increased motor unit amplitude, prolonged motor unit duration and diminished motor unit recruitment.  Conclusion: This is an essentially normal study. Chronic neurogenic changes that were seen in distal leg muscles may not be pathologic in older individuals. No electrophysiologic evidence for ulnar or median neuropathy, large-fiber peripheral  polyneuropathy or radiculopathy. No acute/ongoing denervation was seen. However a small fiber neuropathy could be responsible for patient's symptoms and still evade detection by this study. Clinical correlation recommended.    Sarina Ill, MD  [facility] [address] [place], [place] [address]  Phone [phone] Fax [phone]

## 2014-12-29 NOTE — Progress Notes (Signed)
See procedure note.

## 2015-01-02 NOTE — Procedures (Signed)
[facility]   Provider:  Dr Jaynee Eagles Referring Provider: Deland Pretty, MD Primary Care Physician:  Horatio Pel, MD  HPI:  [name] is a 69 y.o. male here for evaluation of neuropathy. He endorses numbness in the legs, tremors, poor balance, cold feet. He stumbles frequently but denies falls.  Summary  Nerve conduction studies were performed on the right upper and bilateral lower extremities:  Right APB Median motor nerve showed normal conductions with normal F Wave latency Right ADM Ulnar motor nerve showed normal conductions with normal F Wave latency Bilateral Peroneal motor nerves showed normal conductions with normal F Wave latencies Bilateral Tibial motor nerves showed normal conductions with normal F Wave latencies Right second-digit Median sensory nerve conduction was within normal limits Right fifth-digit Ulnar sensory nerve conduction was within normal limits Right Radial sensory nerve conduction was within normal limits Bilateral Sural sensory nerve conductions were within normal limits Bilateral H Reflexes have normal latencies if age and limb length are taken into account.  EMG Needle study was performed on selected right upper and right lower extremity muscles:   The Deltoid, Biceps, Triceps, Pronator Teres, Opponens Pollicis, First Dorsal interosseous, Vastus Medialis, Anterior Tibialis, Medial Gastrocnemius were within normal limits. The Extensor Hallucis Longus showed polyphasic motor units, prolonged motor unit duration and diminished motor unit recruitment. The Abductor Hallucis muscle showed increased motor unit amplitude, prolonged motor unit duration and diminished motor unit recruitment.  Conclusion: This is an essentially normal study. Chronic neurogenic changes that were seen in distal leg muscles may not be pathologic in older individuals. No electrophysiologic evidence for ulnar or median neuropathy, large-fiber peripheral  polyneuropathy or radiculopathy. No acute/ongoing denervation was seen. However a small fiber neuropathy could be responsible for patient's symptoms and still evade detection by this study. Clinical correlation recommended.    Sarina Ill, MD  [facility] [address] [place], [place] [address]  Phone [phone] Fax [phone]

## 2015-01-05 ENCOUNTER — Emergency Department (HOSPITAL_BASED_OUTPATIENT_CLINIC_OR_DEPARTMENT_OTHER): Payer: Medicare Other

## 2015-01-05 ENCOUNTER — Other Ambulatory Visit: Payer: Self-pay

## 2015-01-05 ENCOUNTER — Emergency Department (HOSPITAL_BASED_OUTPATIENT_CLINIC_OR_DEPARTMENT_OTHER)
Admission: EM | Admit: 2015-01-05 | Discharge: 2015-01-05 | Disposition: A | Discharge status: Home / Self Care | Payer: Medicare Other | Attending: Emergency Medicine | Admitting: Emergency Medicine

## 2015-01-05 DIAGNOSIS — R42 Dizziness and giddiness: Secondary | ICD-10-CM | POA: Diagnosis not present

## 2015-01-05 DIAGNOSIS — E785 Hyperlipidemia, unspecified: Secondary | ICD-10-CM | POA: Diagnosis not present

## 2015-01-05 DIAGNOSIS — I10 Essential (primary) hypertension: Secondary | ICD-10-CM | POA: Diagnosis not present

## 2015-01-05 DIAGNOSIS — Z79899 Other long term (current) drug therapy: Secondary | ICD-10-CM | POA: Diagnosis not present

## 2015-01-05 DIAGNOSIS — Z880 Allergy status to penicillin: Secondary | ICD-10-CM | POA: Insufficient documentation

## 2015-01-05 DIAGNOSIS — G629 Polyneuropathy, unspecified: Secondary | ICD-10-CM | POA: Diagnosis not present

## 2015-01-05 DIAGNOSIS — Z8546 Personal history of malignant neoplasm of prostate: Secondary | ICD-10-CM | POA: Insufficient documentation

## 2015-01-05 DIAGNOSIS — Z862 Personal history of diseases of the blood and blood-forming organs and certain disorders involving the immune mechanism: Secondary | ICD-10-CM | POA: Insufficient documentation

## 2015-01-05 DIAGNOSIS — Z7982 Long term (current) use of aspirin: Secondary | ICD-10-CM | POA: Insufficient documentation

## 2015-01-05 DIAGNOSIS — J986 Disorders of diaphragm: Secondary | ICD-10-CM | POA: Diagnosis not present

## 2015-01-05 DIAGNOSIS — R918 Other nonspecific abnormal finding of lung field: Secondary | ICD-10-CM | POA: Diagnosis not present

## 2015-01-05 DIAGNOSIS — Z8701 Personal history of pneumonia (recurrent): Secondary | ICD-10-CM | POA: Diagnosis not present

## 2015-01-05 DIAGNOSIS — M109 Gout, unspecified: Secondary | ICD-10-CM | POA: Insufficient documentation

## 2015-01-05 LAB — CBC WITH DIFFERENTIAL/PLATELET
BASOS ABS: 0.1 10*3/uL (ref 0.0–0.1)
BASOS PCT: 1 % (ref 0–1)
Eosinophils Absolute: 0.3 10*3/uL (ref 0.0–0.7)
Eosinophils Relative: 5 % (ref 0–5)
HCT: 43.5 % (ref 39.0–52.0)
HEMOGLOBIN: 14.3 g/dL (ref 13.0–17.0)
LYMPHS PCT: 25 % (ref 12–46)
Lymphs Abs: 1.6 10*3/uL (ref 0.7–4.0)
MCH: 28.8 pg (ref 26.0–34.0)
MCHC: 32.9 g/dL (ref 30.0–36.0)
MCV: 87.7 fL (ref 78.0–100.0)
MONO ABS: 0.4 10*3/uL (ref 0.1–1.0)
Monocytes Relative: 5 % (ref 3–12)
NEUTROS ABS: 4.1 10*3/uL (ref 1.7–7.7)
Neutrophils Relative %: 64 % (ref 43–77)
Platelets: 235 10*3/uL (ref 150–400)
RBC: 4.96 MIL/uL (ref 4.22–5.81)
RDW: 15.3 % (ref 11.5–15.5)
WBC: 6.4 10*3/uL (ref 4.0–10.5)

## 2015-01-05 LAB — URINALYSIS, ROUTINE W REFLEX MICROSCOPIC
BILIRUBIN URINE: NEGATIVE
Glucose, UA: NEGATIVE mg/dL
Hgb urine dipstick: NEGATIVE
Ketones, ur: NEGATIVE mg/dL
Leukocytes, UA: NEGATIVE
Nitrite: NEGATIVE
Protein, ur: NEGATIVE mg/dL
Specific Gravity, Urine: 1.021 (ref 1.005–1.030)
UROBILINOGEN UA: 0.2 mg/dL (ref 0.0–1.0)
pH: 5.5 (ref 5.0–8.0)

## 2015-01-05 LAB — COMPREHENSIVE METABOLIC PANEL
ALBUMIN: 3.9 g/dL (ref 3.5–5.0)
ALT: 35 U/L (ref 17–63)
AST: 29 U/L (ref 15–41)
Alkaline Phosphatase: 130 U/L — ABNORMAL HIGH (ref 38–126)
Anion gap: 9 (ref 5–15)
BUN: 14 mg/dL (ref 6–20)
CALCIUM: 9.3 mg/dL (ref 8.9–10.3)
CHLORIDE: 103 mmol/L (ref 101–111)
CO2: 29 mmol/L (ref 22–32)
Creatinine, Ser: 0.99 mg/dL (ref 0.61–1.24)
GFR calc non Af Amer: >60 mL/min (ref >60)
Glucose, Bld: 126 mg/dL — ABNORMAL HIGH (ref 65–99)
Potassium: 3.8 mmol/L (ref 3.5–5.1)
Sodium: 141 mmol/L (ref 135–145)
Total Bilirubin: 0.2 mg/dL — ABNORMAL LOW (ref 0.3–1.2)
Total Protein: 7.3 g/dL (ref 6.5–8.1)

## 2015-01-05 LAB — TROPONIN I: Troponin I: <0.03 ng/mL (ref <0.031)

## 2015-01-05 NOTE — ED Notes (Signed)
Pt returned from radiology.

## 2015-01-05 NOTE — ED Provider Notes (Signed)
CSN: [id number]     Arrival date & time 01/05/15  1057 History   First MD Initiated Contact with Patient 01/05/15 1245     Chief Complaint  Patient presents with  . Dizziness     (Consider location/radiation/quality/duration/timing/severity/associated sxs/prior Treatment) HPI Comments: Pt comes in with c/o onset of dizziness(feeling off balance) that started acute this morning with getting up after eating breakfast. Pt states that he feels better now. He had one other episode similar to this and he was seen by neurology and they did some nerve conduction studies. Denies cp,sob, blurred vision, confusion, slurred speech. State that he did get clammy with the event. Pt has had a change in his blood pressure medications over the last month. He states that he was put back on atenolol about 2 weeks ago  The history is provided by the patient. No language interpreter was used.    Past Medical History  Diagnosis Date  . Pneumonia   . OSA (obstructive sleep apnea)   . History of prostate cancer Feb 1993  . Elevated diaphragm   . Normocytic anemia   . Hypoxemia   . Hypertension   . Gout   . Peripheral neuropathy   . Hyperlipidemia   . Cancer   . Vision abnormalities    Past Surgical History  Procedure Laterality Date  . Appendectomy    . Prostatectomy    . Shoulder surgery      bone spur removed from right   Family History  Problem Relation Age of Onset  . Emphysema Father   . Emphysema Brother   . Asthma Daughter   . Congestive Heart Failure Mother    History  Substance Use Topics  . Smoking status: Never Smoker   . Smokeless tobacco: Never Used  . Alcohol Use: Yes     Comment: occasionally    Review of Systems  All other systems reviewed and are negative.     Allergies  Codeine; Penicillins; and Sulfa antibiotics  Home Medications   Prior to Admission medications   Medication Sig Start Date End Date Taking? Authorizing Provider  allopurinol (ZYLOPRIM) 100 MG  tablet Take 100 mg by mouth daily.      Historical Provider, MD  aspirin 81 MG tablet Take 81 mg by mouth daily.      Historical Provider, MD  bisoprolol (ZEBETA) 5 MG tablet Take 1 tablet (5 mg total) by mouth daily. 12/15/14   Tanda Rockers, MD  Cholecalciferol (VITAMIN D3) 1000 UNITS tablet Take 1,000 Units by mouth daily.      Historical Provider, MD  dextromethorphan-guaiFENesin (MUCINEX DM) 30-600 MG per 12 hr tablet Take 1 tablet by mouth 2 (two) times daily.    Historical Provider, MD  famotidine (PEPCID) 20 MG tablet One at bedtime 12/15/14   Tanda Rockers, MD  felodipine (PLENDIL) 5 MG 24 hr tablet Take 5 mg by mouth daily.      Historical Provider, MD  fexofenadine (ALLEGRA) 180 MG tablet Take 180 mg by mouth daily.    Historical Provider, MD  Fiber CAPS Take 1 capsule by mouth daily.      Historical Provider, MD  HYDROcodone-homatropine (HYCODAN) 5-1.5 MG/5ML syrup Take 5 mLs by mouth every 6 (six) hours as needed for cough.    Historical Provider, MD  lamoTRIgine (LAMICTAL) 150 MG tablet Take 2 tablets (300 mg total) by mouth 2 (two) times daily. 09/10/14   Britt Bottom, MD  meloxicam (MOBIC) 15 MG tablet Take 15  mg by mouth. 06/15/14 06/15/15  Historical Provider, MD  minoxidil (LONITEN) 10 MG tablet Take 40 mg by mouth daily.  08/25/14   Historical Provider, MD  Multiple Vitamin (MULTIVITAMIN) tablet Take 1 tablet by mouth daily.      Historical Provider, MD  pantoprazole (PROTONIX) 40 MG tablet Take 1 tablet (40 mg total) by mouth daily. Take 30-60 min before first meal of the day 12/15/14   Tanda Rockers, MD  prednisoLONE acetate (PRED FORTE) 1 % ophthalmic suspension Place 1 drop into the left eye at bedtime.  09/07/14   Historical Provider, MD  simvastatin (ZOCOR) 20 MG tablet Take 20 mg by mouth daily.    Historical Provider, MD  zolpidem (AMBIEN) 10 MG tablet Take 10 mg by mouth at bedtime as needed.      Historical Provider, MD   BP 153/83 mmHg  Pulse 68  Temp(Src) 98.1 F  (36.7 C) (Oral)  Resp 16  Ht 5\' 11"  (1.803 m)  Wt 185 lb (83.915 kg)  BMI 25.81 kg/m2  SpO2 95% Physical Exam  Constitutional: He is oriented to person, place, and time. He appears well-developed and well-nourished.  HENT:  Head: Normocephalic and atraumatic.  Right Ear: External ear normal.  Left Ear: External ear normal.  Cardiovascular: Normal rate and regular rhythm.   Pulmonary/Chest: Effort normal.  Abdominal: Soft. Bowel sounds are normal. There is no tenderness.  Musculoskeletal: Normal range of motion.  Neurological: He is alert and oriented to person, place, and time. He exhibits normal muscle tone. Coordination normal.  Negative romberg. No pronator drift.  Skin: Skin is warm and dry.  Nursing note and vitals reviewed.   ED Course  Procedures (including critical care time) Labs Review Labs Reviewed  COMPREHENSIVE METABOLIC PANEL - Abnormal; Notable for the following:    Glucose, Bld 126 (*)    Alkaline Phosphatase 130 (*)    Total Bilirubin 0.2 (*)    All other components within normal limits  CBC WITH DIFFERENTIAL/PLATELET  TROPONIN I  URINALYSIS, ROUTINE W REFLEX MICROSCOPIC (NOT AT [facility])    Imaging Review Dg Chest 2 View  01/05/2015   CLINICAL DATA:  Dizziness and balance difficulties today.  EXAM: CHEST  2 VIEW  COMPARISON:  11/19/2014  FINDINGS: Elevation of the left hemidiaphragm is unchanged. Cardiac silhouette remains mildly enlarged. Linear opacities in both lung bases are similar to the prior study and suggestive of scarring. There is no evidence of acute airspace consolidation, edema, pleural effusion, or pneumothorax. No acute osseous abnormality is identified.  IMPRESSION: No active cardiopulmonary disease.   Electronically Signed   By: Logan Bores   On: 01/05/2015 12:30   Ct Head Wo Contrast  01/05/2015   CLINICAL DATA:  Dizziness and balance disturbance today after eating.  EXAM: CT HEAD WITHOUT CONTRAST  TECHNIQUE: Contiguous axial images were  obtained from the base of the skull through the vertex without intravenous contrast.  COMPARISON:  11/30/2012  FINDINGS: The brain shows generalized age related atrophy. There is no evidence of old or acute focal infarction, mass lesion, hemorrhage, hydrocephalus or extra-axial collection. The calvarium is unremarkable. Sinuses, middle ears and mastoids are clear.  IMPRESSION: Age related atrophy.  No focal or acute finding.   Electronically Signed   By: Nelson Chimes M.D.   On: 01/05/2015 13:44     EKG Interpretation   Date/Time:  Wednesday January 05 2015 11:08:33 EDT Ventricular Rate:  67 PR Interval:  170 QRS Duration: 84 QT Interval:  438 QTC Calculation: 462 R Axis:   52 Text Interpretation:  Normal sinus rhythm Nonspecific T wave abnormality  Prolonged QT Abnormal ECG No significant change since last tracing  Confirmed by Winfred Leeds  MD, SAM [phone]) on 01/05/2015 11:14:32 AM      MDM   Final diagnoses:  Dizziness    Pt ambulatory without and problem. No neurologic deficit. Pt has has similar episodes and is seeing neurology for the symptoms. Doubt cva or tia.no cp or sob    Glendell Docker, NP 01/05/15 [place], MD 01/05/15 [phone]

## 2015-01-05 NOTE — Telephone Encounter (Signed)
I have spoken with [name] this afternoon and per RAS, advised him that mri is normal with exception of mild arthritic changes that are normal for age; that arthritic changes are not putting pressure on any nerves. [name] verbalized understanding of same.  We also reviewed emg result (normal), and lab results--ok with exception of copper level which is slightly high at 180.  [name] sts. that this am he had a 3 hr. episode of dizziness, was seen in the ed and had major causes such as cva, cardiac event ruled out. He would like to discuss this further with RAS.  Appt. given for tomorrow at 1140/fim

## 2015-01-05 NOTE — Discharge Instructions (Signed)

## 2015-01-05 NOTE — ED Notes (Signed)
Patient transported to X-ray 

## 2015-01-05 NOTE — ED Notes (Signed)
States he was sitting at a table, finished breakfast and felt dizzy described as unbalanced and lightheaded PTA.  Previous episode 2 weeks ago that resolved.

## 2015-01-05 NOTE — Telephone Encounter (Signed)
-----   Message from Britt Bottom, MD sent at 12/24/2014  9:21 AM EDT ----- MRI is essentially normal --  shows mild arthritis which is expected at age 69.   No nerve root compression.   Spinal cord is normal

## 2015-01-06 ENCOUNTER — Encounter: Payer: Self-pay | Admitting: Neurology

## 2015-01-06 ENCOUNTER — Ambulatory Visit (INDEPENDENT_AMBULATORY_CARE_PROVIDER_SITE_OTHER): Payer: Medicare Other | Admitting: Neurology

## 2015-01-06 VITALS — BP 142/80 | HR 72 | Resp 14 | Ht 71.0 in | Wt 183.8 lb

## 2015-01-06 DIAGNOSIS — G459 Transient cerebral ischemic attack, unspecified: Secondary | ICD-10-CM | POA: Diagnosis not present

## 2015-01-06 DIAGNOSIS — R42 Dizziness and giddiness: Secondary | ICD-10-CM

## 2015-01-06 DIAGNOSIS — G250 Essential tremor: Secondary | ICD-10-CM | POA: Diagnosis not present

## 2015-01-06 DIAGNOSIS — G629 Polyneuropathy, unspecified: Secondary | ICD-10-CM

## 2015-01-06 DIAGNOSIS — G4733 Obstructive sleep apnea (adult) (pediatric): Secondary | ICD-10-CM

## 2015-01-06 NOTE — Progress Notes (Signed)
[facility]  PATIENT: [name] DOB: 1945/11/18  REFERRING CLINICIAN: Deland Pretty HISTORY FROM: Patient REASON FOR VISIT: Polyneuropathy and tremors   HISTORICAL  CHIEF COMPLAINT:  Chief Complaint  Patient presents with  . Dizziness    Recently seen for f/u of osa.  He reports compliance with cpap.  Sts. neuropathy in bilat lower legs is some better since last ov. He is aware emg was normal.  He has some questions regarding lab results (copper was slightly  high).  Today he is here with new c/o dizziness, onset about 10am yesterday morning.  He was seen at [facility] ED and sts. all testing was negative for cva, cardiac event, and to f/u with Dr. Felecia Shelling.  Dizziness completely resolved after 3-4 hours and has not returned/fim  . Sleep Apnea  . Peripheral Neuropathy    HISTORY OF PRESENT ILLNESS:  [name] is a 69 year old man who recently went to the ER after an episode of dizziness with vertigo and reduced balance.    Yesterday, he ate lunch at a restaurant and then had the onset of vertigo.  He felt his eyes were jerking around.   He felt off balanced when he stood up -  being pushed to the right.    When he sat back down, he felt better.    The entire episode was 2 minutes of eyes jerking and reduced balance and at least an hour of reduced balance. He needed help to get tot he car.   He went to the [facility] ER.   He had trouble transferring to wheelchair to get inside.   About an hour later he stood up for an xray and felt better and felt back to baseline 30 minutes later when he was evaluated by a doctor.    Of note, while in the ED, he eflt fine while laying down.   He feels baseline today.      He had no chest pain, SOB, palpitations.  His wife noted he looked clammy with some sweats.    A CT scan was performed and showed atrophy but no acute findings.  I reviewed the images and concur.   He had no weakness or numbness or bladder changes.     He  was on ASA 81 mg at the time of this event.     BP was 130/60 by his report.     STROKE/TIA  RISK FACTORS:  He does not smoke.   He has had HTN x years (and BP usually good), hyperlipidemia (well controlled on simvastatin).     POLYNEUROPATHY:  He has a long history of polyneuropathy that became symptomatic in 1996 and was diagnosed in 2001 at [facility] after Nerve conduction and EMG testing .  Recent NCV/EMG did not show any evidence of a large fiber polyneuropathy. Small vessel polyneuropathy cannot be ruled out.  His symptoms are worse at night. Lamotrigine 300 mg by mouth twice a day has helped the pain the most.    Neurontin and a tricyclic antidepressant did not help the pain. Blood work including B12, cryoglobulins and electrophoresis had been essentially normal in the past, last tested in 2013.  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 The tremor is worse when he is upset or anxious. He was taken off atenolol and change to different blood pressure medicine for a while but the tremor was quite a bit worse and he went back on  atenolol.  In the past, we tried Mysoline but even at a low dose he felt very sleepy and needed to stop.      OSA:   He has sleep apnea and uses CPAP nightly. He feels more refreshed during the day and is less likely to doze off on CPAP that he was before he started using it. He no longer needs nocturnal oxygen.   He denies daytime sleepiness.         REVIEW OF SYSTEMS:  Constitutional: No fevers, chills, sweats, or change in appetite Eyes: No visual changes, double vision, eye pain Ear, nose and throat: No hearing loss, ear pain, nasal congestion, sore throat Cardiovascular: No chest pain, palpitations Respiratory:  No shortness of breath at rest or with exertion.   No wheezes   He has OSA and is on CPAP GastrointestinaI: No nausea, vomiting, diarrhea, abdominal pain, fecal incontinence Genitourinary:  No dysuria, urinary retention or  frequency.  No nocturia. Musculoskeletal:  No neck pain, back pain Integumentary: No rash, pruritus, skin lesions Neurological: as above Psychiatric: No depression at this time.  No anxiety Endocrine: No palpitations, diaphoresis, change in appetite, change in weigh or increased thirst Hematologic/Lymphatic:  No anemia, purpura, petechiae. Allergic/Immunologic: No itchy/runny eyes, nasal congestion, recent allergic reactions, rashes  ALLERGIES: Allergies  Allergen Reactions  . Codeine      nausea  . Penicillins     Swelling, rash  . Sulfa Antibiotics     HOME MEDICATIONS: Outpatient Prescriptions Prior to Visit  Medication Sig Dispense Refill  . allopurinol (ZYLOPRIM) 100 MG tablet Take 100 mg by mouth daily.      Marland Kitchen aspirin 81 MG tablet Take 81 mg by mouth daily.      . Cholecalciferol (VITAMIN D3) 1000 UNITS tablet Take 1,000 Units by mouth daily.      . famotidine (PEPCID) 20 MG tablet One at bedtime    . felodipine (PLENDIL) 5 MG 24 hr tablet Take 5 mg by mouth daily.      . fexofenadine (ALLEGRA) 180 MG tablet Take 180 mg by mouth daily.    . Fiber CAPS Take 1 capsule by mouth daily.      Marland Kitchen lamoTRIgine (LAMICTAL) 150 MG tablet Take 2 tablets (300 mg total) by mouth 2 (two) times daily. 120 tablet 11  . minoxidil (LONITEN) 10 MG tablet Take 40 mg by mouth daily.     . Multiple Vitamin (MULTIVITAMIN) tablet Take 1 tablet by mouth daily.      . pantoprazole (PROTONIX) 40 MG tablet Take 1 tablet (40 mg total) by mouth daily. Take 30-60 min before first meal of the day 30 tablet 2  . prednisoLONE acetate (PRED FORTE) 1 % ophthalmic suspension Place 1 drop into the left eye at bedtime.     . simvastatin (ZOCOR) 20 MG tablet Take 20 mg by mouth daily.    Marland Kitchen zolpidem (AMBIEN) 10 MG tablet Take 10 mg by mouth at bedtime as needed.      . bisoprolol (ZEBETA) 5 MG tablet Take 1 tablet (5 mg total) by mouth daily. 60 tablet 11  . meloxicam (MOBIC) 15 MG tablet Take 15 mg by mouth.    .  dextromethorphan-guaiFENesin (MUCINEX DM) 30-600 MG per 12 hr tablet Take 1 tablet by mouth 2 (two) times daily.    Marland Kitchen HYDROcodone-homatropine (HYCODAN) 5-1.5 MG/5ML syrup Take 5 mLs by mouth every 6 (six) hours as needed for cough.     No facility-administered medications prior to visit.  PAST MEDICAL HISTORY: Past Medical History  Diagnosis Date  . Pneumonia   . OSA (obstructive sleep apnea)   . History of prostate cancer Feb 1993  . Elevated diaphragm   . Normocytic anemia   . Hypoxemia   . Hypertension   . Gout   . Peripheral neuropathy   . Hyperlipidemia   . Cancer   . Vision abnormalities     PAST SURGICAL HISTORY: Past Surgical History  Procedure Laterality Date  . Appendectomy    . Prostatectomy    . Shoulder surgery      bone spur removed from right    FAMILY HISTORY: Family History  Problem Relation Age of Onset  . Emphysema Father   . Emphysema Brother   . Asthma Daughter   . Congestive Heart Failure Mother     SOCIAL HISTORY:  History   Social History  . Marital Status: Married    Spouse Name: N/A  . Number of Children: 2  . Years of Education: N/A   Occupational History  . Retired     Company secretary   Social History Main Topics  . Smoking status: Never Smoker   . Smokeless tobacco: Never Used  . Alcohol Use: Yes     Comment: occasionally  . Drug Use: No  . Sexual Activity: Not on file   Other Topics Concern  . Not on file   Social History Narrative     PHYSICAL EXAM  Filed Vitals:   01/06/15 1132 01/06/15 1135  BP: 142/78 142/80  Pulse: 78 72  Resp: 14 14  Height: 5\' 11"  (1.803 m)   Weight: 183 lb 12.8 oz (83.371 kg)     Body mass index is 25.65 kg/(m^2).   General: The patient is well-developed and well-nourished and in no acute distress   Neurologic Exam  Mental status: The patient is alert and oriented x 3 at the time of the examination. The patient has apparent normal recent and remote memory, with an apparently  normal attention span and concentration ability.   Speech is normal.  Cranial nerves: Extraocular movements are full. The left pupil is larger than the right and has reduced reactivity. .  Facial symmetry is present. There is good facial sensation to soft touch bilaterally.Facial strength is normal.  Trapezius and sternocleidomastoid strength is normal. No dysarthria is noted.  Hearing is symmetric.   Motor:  Muscle bulk is reduecd in the calves.   Muscle tone is normal. Strength is  5 / 5 in all 4 extremities.  He is able to squat and rise. He can get out of a chair 5 times without using his arms. He has difficulty walking on his heels or toes.  Sensory: Sensory testing is intact to pinprick, soft touch, vibration sensation, and position sense in the arms but he has reduced vibration sensation in the upper toes and mildly at the ankles compared to the knees. He has a gradient of temperature sensation in the shin. As decreased touch sensation in his toes  and foot.   Coordination: Cerebellar testing reveals good finger-nose-finger  Gait and station: Station is stable with the eyes open but unstable with his eyes closed.  His gait is better when he looks down at his feet as he walks. He is unable to do a tandem walk when he looks up or with his eyes closed but is able to do tandem walk if he looks at his feet.  Reflexes: Deep tendon reflexes are increased with spread at  the knees    DIAGNOSTIC DATA (LABS, IMAGING, TESTING) - I reviewed patient records, labs, notes, testing and imaging myself where available.  Lab Results  Component Value Date   WBC 6.4 01/05/2015   HGB 14.3 01/05/2015   HCT 43.5 01/05/2015   MCV 87.7 01/05/2015   PLT 235 01/05/2015      Component Value Date/Time   NA 141 01/05/2015 1130   K 3.8 01/05/2015 1130   CL 103 01/05/2015 1130   CO2 29 01/05/2015 1130   GLUCOSE 126* 01/05/2015 1130   BUN 14 01/05/2015 1130   CREATININE 0.99 01/05/2015 1130   CALCIUM 9.3  01/05/2015 1130   PROT 7.3 01/05/2015 1130   ALBUMIN 3.9 01/05/2015 1130   AST 29 01/05/2015 1130   ALT 35 01/05/2015 1130   ALKPHOS 130* 01/05/2015 1130   BILITOT 0.2* 01/05/2015 1130   GFRNONAA >60 01/05/2015 1130   GFRAA >60 01/05/2015 1130   Lab Results  Component Value Date   CHOL 126 01/20/2011   HDL 51 01/20/2011   LDLCALC 63 01/20/2011   TRIG 60 01/20/2011   CHOLHDL 2.5 01/20/2011   No results found for: HGBA1C Lab Results  Component Value Date   VITAMINB12 683 12/14/2014   Lab Results  Component Value Date   TSH 2.255 02/08/2011   Copper = 180   (72-166)    ASSESSMENT AND PLAN  Dizziness and giddiness - Plan: MR Brain Wo Contrast, MR MRA HEAD WO CONTRAST  Polyneuropathy  Essential tremor - Plan: MR Brain Wo Contrast  OSA (obstructive sleep apnea)  Transient cerebral ischemia, unspecified transient cerebral ischemia type - Plan: MR Brain Wo Contrast, MR MRA HEAD WO CONTRAST   1.   The etiology of his episode of vertigo is not clear. I am concerned about the possibility of a transient cerebral attack and we will check an MRI of the brain and MR angiogram to better the possibility that he has had ischemic changes or is at risk of such. Additionally, this will allow [place] to assess the internal auditory canals. 2.  Continue atenolol for tremor and Lamictal for dysesthetic pain. 3.  Continue CPAP for OSA. He will return to clinic in 3  Months or sooner if he has new or worsening neurologic symptoms.  Richard A. Felecia Shelling, MD, PhD 10/11/9022, 09:73 AM Certified in Neurology, Clinical Neurophysiology, Sleep Medicine, Pain Medicine and Neuroimaging  [facility] [address] [place], [place] [address] [phone]

## 2015-01-11 ENCOUNTER — Ambulatory Visit: Payer: PRIVATE HEALTH INSURANCE | Admitting: Neurology

## 2015-01-13 ENCOUNTER — Ambulatory Visit: Payer: PRIVATE HEALTH INSURANCE | Admitting: Neurology

## 2015-01-22 ENCOUNTER — Ambulatory Visit
Admission: RE | Admit: 2015-01-22 | Discharge: 2015-01-22 | Disposition: A | Discharge status: Home / Self Care | Payer: Medicare Other | Source: Ambulatory Visit | Attending: Neurology | Admitting: Neurology

## 2015-01-22 DIAGNOSIS — G459 Transient cerebral ischemic attack, unspecified: Secondary | ICD-10-CM

## 2015-01-22 DIAGNOSIS — R42 Dizziness and giddiness: Secondary | ICD-10-CM

## 2015-01-22 DIAGNOSIS — G250 Essential tremor: Secondary | ICD-10-CM | POA: Diagnosis not present

## 2015-01-24 ENCOUNTER — Emergency Department (HOSPITAL_BASED_OUTPATIENT_CLINIC_OR_DEPARTMENT_OTHER): Payer: Medicare Other

## 2015-01-24 ENCOUNTER — Emergency Department (HOSPITAL_BASED_OUTPATIENT_CLINIC_OR_DEPARTMENT_OTHER)
Admission: EM | Admit: 2015-01-24 | Discharge: 2015-01-24 | Disposition: A | Discharge status: Home / Self Care | Payer: Medicare Other | Attending: Emergency Medicine | Admitting: Emergency Medicine

## 2015-01-24 ENCOUNTER — Encounter (HOSPITAL_BASED_OUTPATIENT_CLINIC_OR_DEPARTMENT_OTHER): Payer: Self-pay | Admitting: *Deleted

## 2015-01-24 DIAGNOSIS — S0093XA Contusion of unspecified part of head, initial encounter: Secondary | ICD-10-CM | POA: Diagnosis not present

## 2015-01-24 DIAGNOSIS — Z8669 Personal history of other diseases of the nervous system and sense organs: Secondary | ICD-10-CM | POA: Diagnosis not present

## 2015-01-24 DIAGNOSIS — Z8709 Personal history of other diseases of the respiratory system: Secondary | ICD-10-CM | POA: Diagnosis not present

## 2015-01-24 DIAGNOSIS — Z8546 Personal history of malignant neoplasm of prostate: Secondary | ICD-10-CM | POA: Insufficient documentation

## 2015-01-24 DIAGNOSIS — Y9301 Activity, walking, marching and hiking: Secondary | ICD-10-CM | POA: Diagnosis not present

## 2015-01-24 DIAGNOSIS — R55 Syncope and collapse: Secondary | ICD-10-CM | POA: Diagnosis not present

## 2015-01-24 DIAGNOSIS — R791 Abnormal coagulation profile: Secondary | ICD-10-CM | POA: Diagnosis not present

## 2015-01-24 DIAGNOSIS — M109 Gout, unspecified: Secondary | ICD-10-CM | POA: Insufficient documentation

## 2015-01-24 DIAGNOSIS — S0003XA Contusion of scalp, initial encounter: Secondary | ICD-10-CM | POA: Insufficient documentation

## 2015-01-24 DIAGNOSIS — Z880 Allergy status to penicillin: Secondary | ICD-10-CM | POA: Diagnosis not present

## 2015-01-24 DIAGNOSIS — Z79899 Other long term (current) drug therapy: Secondary | ICD-10-CM | POA: Diagnosis not present

## 2015-01-24 DIAGNOSIS — S0990XA Unspecified injury of head, initial encounter: Secondary | ICD-10-CM | POA: Diagnosis not present

## 2015-01-24 DIAGNOSIS — Y92511 Restaurant or cafe as the place of occurrence of the external cause: Secondary | ICD-10-CM | POA: Insufficient documentation

## 2015-01-24 DIAGNOSIS — Y998 Other external cause status: Secondary | ICD-10-CM | POA: Diagnosis not present

## 2015-01-24 DIAGNOSIS — Z7982 Long term (current) use of aspirin: Secondary | ICD-10-CM | POA: Diagnosis not present

## 2015-01-24 DIAGNOSIS — E785 Hyperlipidemia, unspecified: Secondary | ICD-10-CM | POA: Diagnosis not present

## 2015-01-24 DIAGNOSIS — R42 Dizziness and giddiness: Secondary | ICD-10-CM | POA: Diagnosis present

## 2015-01-24 DIAGNOSIS — R590 Localized enlarged lymph nodes: Secondary | ICD-10-CM | POA: Diagnosis not present

## 2015-01-24 DIAGNOSIS — W01198A Fall on same level from slipping, tripping and stumbling with subsequent striking against other object, initial encounter: Secondary | ICD-10-CM | POA: Insufficient documentation

## 2015-01-24 DIAGNOSIS — Z862 Personal history of diseases of the blood and blood-forming organs and certain disorders involving the immune mechanism: Secondary | ICD-10-CM | POA: Insufficient documentation

## 2015-01-24 DIAGNOSIS — S299XXA Unspecified injury of thorax, initial encounter: Secondary | ICD-10-CM | POA: Diagnosis not present

## 2015-01-24 DIAGNOSIS — Z8701 Personal history of pneumonia (recurrent): Secondary | ICD-10-CM | POA: Insufficient documentation

## 2015-01-24 DIAGNOSIS — I10 Essential (primary) hypertension: Secondary | ICD-10-CM | POA: Insufficient documentation

## 2015-01-24 LAB — COMPREHENSIVE METABOLIC PANEL
ALT: 12 U/L — ABNORMAL LOW (ref 17–63)
AST: 18 U/L (ref 15–41)
Albumin: 4 g/dL (ref 3.5–5.0)
Alkaline Phosphatase: 98 U/L (ref 38–126)
Anion gap: 10 (ref 5–15)
BILIRUBIN TOTAL: 0.5 mg/dL (ref 0.3–1.2)
BUN: 16 mg/dL (ref 6–20)
CO2: 27 mmol/L (ref 22–32)
Calcium: 9.3 mg/dL (ref 8.9–10.3)
Chloride: 103 mmol/L (ref 101–111)
Creatinine, Ser: 0.95 mg/dL (ref 0.61–1.24)
GFR calc Af Amer: >60 mL/min (ref >60)
GFR calc non Af Amer: >60 mL/min (ref >60)
GLUCOSE: 133 mg/dL — AB (ref 65–99)
Potassium: 4.1 mmol/L (ref 3.5–5.1)
Sodium: 140 mmol/L (ref 135–145)
TOTAL PROTEIN: 7.6 g/dL (ref 6.5–8.1)

## 2015-01-24 LAB — CBC WITH DIFFERENTIAL/PLATELET
BASOS ABS: 0.1 10*3/uL (ref 0.0–0.1)
Basophils Relative: 1 % (ref 0–1)
Eosinophils Absolute: 0.2 10*3/uL (ref 0.0–0.7)
Eosinophils Relative: 3 % (ref 0–5)
HCT: 38.9 % — ABNORMAL LOW (ref 39.0–52.0)
HEMOGLOBIN: 12.7 g/dL — AB (ref 13.0–17.0)
Lymphocytes Relative: 23 % (ref 12–46)
Lymphs Abs: 1.4 10*3/uL (ref 0.7–4.0)
MCH: 28.9 pg (ref 26.0–34.0)
MCHC: 32.6 g/dL (ref 30.0–36.0)
MCV: 88.4 fL (ref 78.0–100.0)
MONO ABS: 0.4 10*3/uL (ref 0.1–1.0)
Monocytes Relative: 6 % (ref 3–12)
Neutro Abs: 4 10*3/uL (ref 1.7–7.7)
Neutrophils Relative %: 67 % (ref 43–77)
Platelets: 196 10*3/uL (ref 150–400)
RBC: 4.4 MIL/uL (ref 4.22–5.81)
RDW: 15.7 % — AB (ref 11.5–15.5)
WBC: 6 10*3/uL (ref 4.0–10.5)

## 2015-01-24 LAB — D-DIMER, QUANTITATIVE: D-Dimer, Quant: 1.29 ug/mL-FEU — ABNORMAL HIGH (ref 0.00–0.48)

## 2015-01-24 LAB — TROPONIN I: Troponin I: <0.03 ng/mL (ref <0.031)

## 2015-01-24 MED ORDER — MECLIZINE HCL 25 MG PO TABS
25.0000 mg | ORAL_TABLET | Freq: Once | ORAL | Status: AC
  Administered 2015-01-24: 25 mg via ORAL
  Filled 2015-01-24: qty 1

## 2015-01-24 MED ORDER — SODIUM CHLORIDE 0.9 % IV BOLUS (SEPSIS)
500.0000 mL | Freq: Once | INTRAVENOUS | Status: AC
  Administered 2015-01-24: 500 mL via INTRAVENOUS

## 2015-01-24 MED ORDER — IOHEXOL 350 MG/ML SOLN
100.0000 mL | Freq: Once | INTRAVENOUS | Status: AC | PRN
  Administered 2015-01-24: 100 mL via INTRAVENOUS

## 2015-01-24 MED ORDER — MECLIZINE HCL 25 MG PO TABS: 25.0000 mg | ORAL_TABLET | Freq: Three times a day (TID) | ORAL | Status: DC | PRN

## 2015-01-24 NOTE — ED Notes (Signed)
Vital signs delayed due to patient being in imaging.

## 2015-01-24 NOTE — ED Notes (Signed)
Patient transported to CT 

## 2015-01-24 NOTE — ED Notes (Signed)
Pt returned from radiology, no change in pt assessment.

## 2015-01-24 NOTE — ED Notes (Signed)
Patient ambulates around RN station with EMT without difficulty or assistance.

## 2015-01-24 NOTE — ED Notes (Signed)
Dizziness. Today he fell and hit his head. Pale. Hematoma to the right side of his head.

## 2015-01-24 NOTE — Discharge Instructions (Signed)
Benign Positional Vertigo °Vertigo means you feel like you or your surroundings are moving when they are not. Benign positional vertigo is the most common form of vertigo. Benign means that the cause of your condition is not serious. Benign positional vertigo is more common in older adults. °CAUSES  °Benign positional vertigo is the result of an upset in the labyrinth system. This is an area in the middle ear that helps control your balance. This may be caused by a viral infection, head injury, or repetitive motion. However, often no specific cause is found. °SYMPTOMS  °Symptoms of benign positional vertigo occur when you move your head or eyes in different directions. Some of the symptoms may include: °· Loss of balance and falls. °· Vomiting. °· Blurred vision. °· Dizziness. °· Nausea. °· Involuntary eye movements (nystagmus). °DIAGNOSIS  °Benign positional vertigo is usually diagnosed by physical exam. If the specific cause of your benign positional vertigo is unknown, your caregiver may perform imaging tests, such as magnetic resonance imaging (MRI) or computed tomography (CT). °TREATMENT  °Your caregiver may recommend movements or procedures to correct the benign positional vertigo. Medicines such as meclizine, benzodiazepines, and medicines for nausea may be used to treat your symptoms. In rare cases, if your symptoms are caused by certain conditions that affect the inner ear, you may need surgery. °HOME CARE INSTRUCTIONS  °· Follow your caregiver's instructions. °· Move slowly. Do not make sudden body or head movements. °· Avoid driving. °· Avoid operating heavy machinery. °· Avoid performing any tasks that would be dangerous to you or others during a vertigo episode. °· Drink enough fluids to keep your urine clear or pale yellow. °SEEK IMMEDIATE MEDICAL CARE IF:  °· You develop problems with walking, weakness, numbness, or using your arms, hands, or legs. °· You have difficulty speaking. °· You develop  severe headaches. °· Your nausea or vomiting continues or gets worse. °· You develop visual changes. °· Your family or friends notice any behavioral changes. °· Your condition gets worse. °· You have a fever. °· You develop a stiff neck or sensitivity to light. °MAKE SURE YOU:  °· Understand these instructions. °· Will watch your condition. °· Will get help right away if you are not doing well or get worse. °Document Released: 04/30/2006 Document Revised: 10/15/2011 Document Reviewed: 04/12/2011 °ExitCare® Patient Information ©2015 [employer]. This information is not intended to replace advice given to you by your health care provider. Make sure you discuss any questions you have with your health care provider. ° °Dizziness °Dizziness is a common problem. It is a feeling of unsteadiness or light-headedness. You may feel like you are about to faint. Dizziness can lead to injury if you stumble or fall. A person of any age group can suffer from dizziness, but dizziness is more common in older adults. °CAUSES  °Dizziness can be caused by many different things, including: °· Middle ear problems. °· Standing for too long. °· Infections. °· An allergic reaction. °· Aging. °· An emotional response to something, such as the sight of blood. °· Side effects of medicines. °· Tiredness. °· Problems with circulation or blood pressure. °· Excessive use of alcohol or medicines, or illegal drug use. °· Breathing too fast (hyperventilation). °· An irregular heart rhythm (arrhythmia). °· A low red blood cell count (anemia). °· Pregnancy. °· Vomiting, diarrhea, fever, or other illnesses that cause body fluid loss (dehydration). °· Diseases or conditions such as Parkinson's disease, high blood pressure (hypertension), diabetes, and thyroid problems. °·   Exposure to extreme heat. °DIAGNOSIS  °Your health care provider will ask about your symptoms, perform a physical exam, and perform an electrocardiogram (ECG) to record the electrical  activity of your heart. Your health care provider may also perform other heart or blood tests to determine the cause of your dizziness. These may include: °· Transthoracic echocardiogram (TTE). During echocardiography, sound waves are used to evaluate how blood flows through your heart. °· Transesophageal echocardiogram (TEE). °· Cardiac monitoring. This allows your health care provider to monitor your heart rate and rhythm in real time. °· Holter monitor. This is a portable device that records your heartbeat and can help diagnose heart arrhythmias. It allows your health care provider to track your heart activity for several days if needed. °· Stress tests by exercise or by giving medicine that makes the heart beat faster. °TREATMENT  °Treatment of dizziness depends on the cause of your symptoms and can vary greatly. °HOME CARE INSTRUCTIONS  °· Drink enough fluids to keep your urine clear or pale yellow. This is especially important in very hot weather. In older adults, it is also important in cold weather. °· Take your medicine exactly as directed if your dizziness is caused by medicines. When taking blood pressure medicines, it is especially important to get up slowly. °¨ Rise slowly from chairs and steady yourself until you feel okay. °¨ In the morning, first sit up on the side of the bed. When you feel okay, stand slowly while holding onto something until you know your balance is fine. °· Move your legs often if you need to stand in one place for a long time. Tighten and relax your muscles in your legs while standing. °· Have someone stay with you for 1-2 days if dizziness continues to be a problem. Do this until you feel you are well enough to stay alone. Have the person call your health care provider if he or she notices changes in you that are concerning. °· Do not drive or use heavy machinery if you feel dizzy. °· Do not drink alcohol. °SEEK IMMEDIATE MEDICAL CARE IF:  °· Your dizziness or light-headedness  gets worse. °· You feel nauseous or vomit. °· You have problems talking, walking, or using your arms, hands, or legs. °· You feel weak. °· You are not thinking clearly or you have trouble forming sentences. It may take a friend or family member to notice this. °· You have chest pain, abdominal pain, shortness of breath, or sweating. °· Your vision changes. °· You notice any bleeding. °· You have side effects from medicine that seems to be getting worse rather than better. °MAKE SURE YOU:  °· Understand these instructions. °· Will watch your condition. °· Will get help right away if you are not doing well or get worse. °Document Released: 01/16/2001 Document Revised: 07/28/2013 Document Reviewed: 02/09/2011 °ExitCare® Patient Information ©2015 [employer]. This information is not intended to replace advice given to you by your health care provider. Make sure you discuss any questions you have with your health care provider. ° °

## 2015-01-24 NOTE — ED Provider Notes (Signed)
CSN: [id number]     Arrival date & time 01/24/15  1452 History  This chart was scribed for Blanchie Dessert, MD by Julien Nordmann, ED Scribe. This patient was seen in room MH06/MH06 and the patient's care was started at 3:09 PM.    Chief Complaint  Patient presents with  . Fall  . Dizziness      The history is provided by the patient. No language interpreter was used.    HPI Comments: [name] is a 69 y.o. male who has a hx of HTN presents to the Emergency Department complaining of a fall that occurred this afternoon when he walked into a restaurant and started feeling lightheaded, pale and sweaty. Pt notes feeling very light headed and fell, hitting his head. He notes having a headache for the past few weeks.Pt denies LOC. Pt reports not feeling well and dizzy yesterday as well. He notes feeling dizzy but not falling. Pt notes he is usually upright when the episodes occur. He reports seeing a neurologist and getting an MRI done after a episode earlier this month which was more like vertigo but today was lightheadedness. He reports not having these symptoms before. This is the pt's 3rd episode in the past month.  He denies visual disturbances, chest pain, shortness of breath, neck pain, abdominal pain. Pt is not on blood thinners. He is a non-smoker.  Past Medical History  Diagnosis Date  . Pneumonia   . OSA (obstructive sleep apnea)   . History of prostate cancer Feb 1993  . Elevated diaphragm   . Normocytic anemia   . Hypoxemia   . Hypertension   . Gout   . Peripheral neuropathy   . Hyperlipidemia   . Cancer   . Vision abnormalities    Past Surgical History  Procedure Laterality Date  . Appendectomy    . Prostatectomy    . Shoulder surgery      bone spur removed from right   Family History  Problem Relation Age of Onset  . Emphysema Father   . Emphysema Brother   . Asthma Daughter   . Congestive Heart Failure Mother    History  Substance Use Topics  . Smoking  status: Never Smoker   . Smokeless tobacco: Never Used  . Alcohol Use: Yes     Comment: occasionally    Review of Systems  A complete 10 system review of systems was obtained and all systems are negative except as noted in the HPI and PMH.    Allergies  Codeine; Penicillins; and Sulfa antibiotics  Home Medications   Prior to Admission medications   Medication Sig Start Date End Date Taking? Authorizing Provider  allopurinol (ZYLOPRIM) 100 MG tablet Take 100 mg by mouth daily.      Historical Provider, MD  aspirin 81 MG tablet Take 81 mg by mouth daily.      Historical Provider, MD  atenolol (TENORMIN) 50 MG tablet  11/16/14   Historical Provider, MD  Cholecalciferol (VITAMIN D3) 1000 UNITS tablet Take 1,000 Units by mouth daily.      Historical Provider, MD  famotidine (PEPCID) 20 MG tablet One at bedtime 12/15/14   Tanda Rockers, MD  felodipine (PLENDIL) 5 MG 24 hr tablet Take 5 mg by mouth daily.      Historical Provider, MD  fexofenadine (ALLEGRA) 180 MG tablet Take 180 mg by mouth daily.    Historical Provider, MD  Fiber CAPS Take 1 capsule by mouth daily.  Historical Provider, MD  lamoTRIgine (LAMICTAL) 150 MG tablet Take 2 tablets (300 mg total) by mouth 2 (two) times daily. 09/10/14   Britt Bottom, MD  meloxicam (MOBIC) 15 MG tablet Take 15 mg by mouth. 06/15/14 06/15/15  Historical Provider, MD  minoxidil (LONITEN) 10 MG tablet Take 40 mg by mouth daily.  08/25/14   Historical Provider, MD  Multiple Vitamin (MULTIVITAMIN) tablet Take 1 tablet by mouth daily.      Historical Provider, MD  pantoprazole (PROTONIX) 40 MG tablet Take 1 tablet (40 mg total) by mouth daily. Take 30-60 min before first meal of the day 12/15/14   Tanda Rockers, MD  prednisoLONE acetate (PRED FORTE) 1 % ophthalmic suspension Place 1 drop into the left eye at bedtime.  09/07/14   Historical Provider, MD  simvastatin (ZOCOR) 20 MG tablet Take 20 mg by mouth daily.    Historical Provider, MD  zolpidem  (AMBIEN) 10 MG tablet Take 10 mg by mouth at bedtime as needed.      Historical Provider, MD   Triage vitals: BP 155/71 mmHg  Pulse 66  Temp(Src) 98.1 F (36.7 C) (Oral)  Resp 14  Ht 5\' 11"  (1.803 m)  Wt 183 lb (83.008 kg)  BMI 25.53 kg/m2  SpO2 96% Physical Exam  Constitutional: He is oriented to person, place, and time. He appears well-developed and well-nourished. No distress.  HENT:  Head: Normocephalic.  Large softball size hematoma in right occipital scalp  Eyes: Conjunctivae are normal. Pupils are equal, round, and reactive to light. No scleral icterus.  Left pupil abnormal in shape with decreased vision 2 mm reactive  Neck: Normal range of motion. Neck supple. No thyromegaly present.  No muscular or bony tenderness of the c spine  Cardiovascular: Normal rate, regular rhythm and normal heart sounds.  Exam reveals no gallop and no friction rub.   No murmur heard. Pulmonary/Chest: Effort normal and breath sounds normal. No respiratory distress. He has no wheezes. He has no rales.  Abdominal: Soft. Bowel sounds are normal. He exhibits no distension. There is no tenderness. There is no rebound.  Musculoskeletal: Normal range of motion. He exhibits no edema or tenderness.  Neurological: He is alert and oriented to person, place, and time.  Skin: Skin is warm and dry. No rash noted.  Psychiatric: He has a normal mood and affect. His behavior is normal.  Nursing note and vitals reviewed.   ED Course  Procedures  DIAGNOSTIC STUDIES: Oxygen Saturation is 96% on RA, adequate by my interpretation.  COORDINATION OF CARE:  3:17 PM Discussed treatment plan which includes CT scan, check BP with pt at bedside and pt agreed to plan.  Labs Review Labs Reviewed  CBC WITH DIFFERENTIAL/PLATELET - Abnormal; Notable for the following:    Hemoglobin 12.7 (*)    HCT 38.9 (*)    RDW 15.7 (*)    All other components within normal limits  COMPREHENSIVE METABOLIC PANEL - Abnormal; Notable  for the following:    Glucose, Bld 133 (*)    ALT 12 (*)    All other components within normal limits  D-DIMER, QUANTITATIVE (NOT AT [facility]) - Abnormal; Notable for the following:    D-Dimer, Quant 1.29 (*)    All other components within normal limits  TROPONIN I    Imaging Review Dg Chest 2 View  01/24/2015   CLINICAL DATA:  [place] today with local head injury, right sided hematoma  EXAM: CHEST - 2 VIEW  COMPARISON:  01/05/2015  FINDINGS: Cardiac shadow is stable. Very minimal scarring is noted in the bases bilaterally. No new focal infiltrate or sizable effusion is seen. No bony abnormality is noted.  IMPRESSION: No acute abnormality.  No change from the prior exam.   Electronically Signed   By: Inez Catalina M.D.   On: 01/24/2015 15:55   Ct Head Wo Contrast  01/24/2015   CLINICAL DATA:  Dizziness with recent fall and head injury  EXAM: CT HEAD WITHOUT CONTRAST  TECHNIQUE: Contiguous axial images were obtained from the base of the skull through the vertex without intravenous contrast.  COMPARISON:  01/22/2015, 01/05/2015  FINDINGS: The bony calvarium is intact. A large right posterior parietal soft tissue hematoma is noted. Atrophic changes are again seen. No findings to suggest acute hemorrhage, acute infarction or space-occupying mass lesion are noted.  IMPRESSION: Soft tissue hematoma.  No acute intracranial abnormality is noted.   Electronically Signed   By: Inez Catalina M.D.   On: 01/24/2015 16:01   Ct Angio Chest Pe W/cm &/or Wo Cm  01/24/2015   CLINICAL DATA:  Dizziness and elevated D-dimer  EXAM: CT ANGIOGRAPHY CHEST WITH CONTRAST  TECHNIQUE: Multidetector CT imaging of the chest was performed using the standard protocol during bolus administration of intravenous contrast. Multiplanar CT image reconstructions and MIPs were obtained to evaluate the vascular anatomy.  CONTRAST:  129mL OMNIPAQUE IOHEXOL 350 MG/ML SOLN  COMPARISON:  09/30/2014  FINDINGS: Lungs are well aerated bilaterally and  again demonstrate bibasilar scarring left greater than right. The overall appearance is stable given some variation in the imaging technique. The hilar and mediastinal structures show some pretracheal and precarinal lymph nodes which are stable in appearance from the prior exam. No significant hilar or mediastinal adenopathy is noted. Heavy coronary calcifications are seen.  Pulmonary artery shows no evidence of pulmonary embolus. Elevation of left hemidiaphragm is again seen. The visualized upper abdomen is within normal limits. No acute bony abnormality is noted.  Review of the MIP images confirms the above findings.  IMPRESSION: Persistent scarring in the bases bilaterally much greater on the left than the right.  No evidence of pulmonary embolism.  Stable mediastinal lymph nodes in the precarinal and pre tracheal region  No acute abnormality is noted.   Electronically Signed   By: Inez Catalina M.D.   On: 01/24/2015 16:52     EKG Interpretation   Date/Time:  Monday January 24 2015 15:08:38 EDT Ventricular Rate:  66 PR Interval:  188 QRS Duration: 86 QT Interval:  440 QTC Calculation: 461 R Axis:   51 Text Interpretation:  Normal sinus rhythm Nonspecific T wave abnormality  Prolonged QT No significant change since last tracing Confirmed by  Maryan Rued  MD, Loree Fee ([id number]) on 01/24/2015 3:37:48 PM      MDM   Final diagnoses:  Near syncope  Dizziness   Patient with a history of hypertension, obstructive sleep apnea, a history of prostate cancer in the past and hyperlipidemia who presents today with a 2 day history of feeling lightheaded. Today when he walked into a restaurant no lightheaded feeling got much worse he became pale, diaphoretic and fell hitting the back of his head. He denies complete loss of consciousness. No prior history of syncope. Patient states over the last month he's had 3 episodes of feeling lightheaded but has never fallen until today. He was seen at the beginning of June  for complaints of dizziness however at that time it sounded more like vertigo. He  followed up with his neurologist who did a workup including an MRI and MRA without significant findings.  Patient has been on the same medications for the last month but does take 3 different blood pressure medications. Symptoms are better when he is lying down but he denies any rotational component of his dizziness today. He denies any chest pain, palpitations, shortness of breath. No history of dehydration or heat exposure. Wife states he is fairly sedentary but he denies any unilateral leg pain or swelling. No prior history of blood clots. Patient denies any recent alcohol use.  On exam he is well-appearing but has a large hematoma to the back of his head. He has no neurologic deficits at this time. Low suspicion that patient's symptoms are related to TIA or stroke. More concerned that this is a blood pressure issue versus other cardiovascular component. Could be from medications.  EKG is unchanged. CBC, CMP, d-dimer, troponin, chest x-ray and head CT pending. Patient given 532mL bolus.  Labs are normal except for an elevated d-dimer but CT without acute findings. On reevaluation of the patient now he is describing more vertiginous symptoms that are worse when he attempts to walk or stand. He was recently seen by his neurologist and had an MRI MRA done on Saturday which was reported to him to not have acute findings of stroke or blockage.  Feel that this could be peripheral vertigo. We'll attempt to give patient meclizine to see if he has any improvement. Based on lab findings and new change of story and history feel most likely that this is peripheral vertigo.  7:02 PM After meclizine patient was able to ambulate without ataxia or falls. He states he is feeling well and is ready to go home. Will give patient meclizine and encouraged him to follow-up with his neurologist Dr. Felecia Shelling  I personally performed the services  described in this documentation, which was scribed in my presence.  The recorded information has been reviewed and considered.     Blanchie Dessert, MD 01/24/15 [phone]

## 2015-01-24 NOTE — ED Notes (Signed)
Pt stood up at bedside with wife's assistance. Pt sts he did not notice a change in his dizziness but sts that he did not feel like he was going to fall like he did before.

## 2015-01-25 ENCOUNTER — Telehealth: Payer: Self-pay | Admitting: *Deleted

## 2015-01-25 NOTE — Telephone Encounter (Signed)
I have spoken with [name] and given an appt. for 01-26-15 for f/u of dizziness/fim

## 2015-01-25 NOTE — Telephone Encounter (Signed)
-----   Message from Britt Bottom, MD sent at 01/25/2015  8:38 AM EDT ----- Please see other note about ER visit and offer for visit this week.  MRI shows age related changes but nothing tha wpuld be related to his symptoms.    MR angiogram does not show any significant stenosis

## 2015-01-26 ENCOUNTER — Encounter: Payer: Self-pay | Admitting: Neurology

## 2015-01-26 ENCOUNTER — Ambulatory Visit (INDEPENDENT_AMBULATORY_CARE_PROVIDER_SITE_OTHER): Payer: Medicare Other | Admitting: Neurology

## 2015-01-26 VITALS — BP 166/74 | HR 66 | Resp 16 | Ht 71.0 in | Wt 184.6 lb

## 2015-01-26 DIAGNOSIS — G250 Essential tremor: Secondary | ICD-10-CM

## 2015-01-26 DIAGNOSIS — G629 Polyneuropathy, unspecified: Secondary | ICD-10-CM

## 2015-01-26 DIAGNOSIS — G458 Other transient cerebral ischemic attacks and related syndromes: Secondary | ICD-10-CM

## 2015-01-26 DIAGNOSIS — R42 Dizziness and giddiness: Secondary | ICD-10-CM

## 2015-01-26 DIAGNOSIS — R55 Syncope and collapse: Secondary | ICD-10-CM | POA: Insufficient documentation

## 2015-01-26 NOTE — Progress Notes (Signed)
[facility]  PATIENT: [name] DOB: 1945/09/28  REFERRING CLINICIAN: Deland Pretty HISTORY FROM: Patient REASON FOR VISIT: Polyneuropathy and tremors   HISTORICAL  CHIEF COMPLAINT:  Chief Complaint  Patient presents with  . Dizziness    Sts. on Sunday he felt lightheaded--this resolved, but on Monday he and his wife were eating out--he was standing in line to order and suddenly felt lightheaded, weak,--he fell, hit head on tile floor--no loc--was seen at [facility] [place] [facility]--sts. CT head and chest was negative, sts. labwork was ok as well.  Denies further episodes/fim    HISTORY OF PRESENT ILLNESS:  [name] is a 69 year old man who recently went to the ER after an episode of lightheadedness to him falling  Earlier this week, he was in a restaurant, standing up, when he suddenly felt lightheaded without vertigo.   Marland Kitchen He started sweating and then went down without loss of consciousness hitting the floor.   He did not trip as he was standing still.     Within seconds he felt perfectly fine and was able to pull himself up and even ate lunch quickly before going to the ER.   He had not eaten anything except one granola bar earlier in day.    He felt no palpitations or chest pain or nausea.   He landed flat on his back and hit head.    There was no LOC and he remembers going down.  He went to the emergency room and had a CT scan of the head (no acute findings) and CT angiogram of the chest which did not show any thrombosis.    The previous day, he had a milder episode of lightheadedness occurring in Sunday Class while sitting (walked in 5 minutes earlier).   This one lasted 5-10 minutes and he may not have been thinking as clearly.       Earlier this month he had a differnet type of episode with mixed lighteadedness and vertigo.    He was sitting at  at a restaurant after lunch and then had the onset of vertigo.  He felt his eyes were jerking around.   He felt off  balanced when he stood up -  being pushed to the right.    When he sat back down, he felt better.    The entire episode was 2 minutes of eyes jerking and reduced balance and at least an hour of reduced balance. He needed help to get to the car.   MRI of the brain last week did not show any acute findings. He does have atrophy and small vessel ischemic change. 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   He has had HTN x years (and BP usually good), hyperlipidemia (well controlled on simvastatin).    MRA shows mild nimal intracranial stenosis.  POLYNEUROPATHY:  He has numbness in the toes that has been fairly constant over the past few years..  His symptoms are worse at night. Lamotrigine 300 mg by mouth twice a day has helped the pain the most.    Neurontin and a tricyclic antidepressant did not help the pain. Blood work including B12, cryoglobulins and electrophoresis had been essentially normal in the past, last tested in 2013. Nerve conduction study in the past was mildly abnormal but more recent one showed good functioning of the large  fibers.  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 The tremor is worse when he is upset or anxious. He has done best on atenolol with some control of the tremor.   OSA:   He has sleep apnea and uses CPAP nightly. He feels more refreshed during the day and is less likely to doze off on CPAP that he was before he started using it. He no longer needs nocturnal oxygen.   He denies daytime sleepiness.         REVIEW OF SYSTEMS:  Constitutional: No fevers, chills, sweats, or change in appetite Eyes: No visual changes, double vision, eye pain Ear, nose and throat: No hearing loss, ear pain, nasal congestion, sore throat Cardiovascular: No chest pain, palpitations Respiratory:  No shortness of  breath at rest or with exertion.   No wheezes   He has OSA and is on CPAP GastrointestinaI: No nausea, vomiting, diarrhea, abdominal pain, fecal incontinence Genitourinary:  No dysuria, urinary retention or frequency.  No nocturia. Musculoskeletal:  No neck pain, back pain Integumentary: No rash, pruritus, skin lesions Neurological: as above Psychiatric: No depression at this time.  No anxiety Endocrine: No palpitations, diaphoresis, change in appetite, change in weigh or increased thirst Hematologic/Lymphatic:  No anemia, purpura, petechiae. Allergic/Immunologic: No itchy/runny eyes, nasal congestion, recent allergic reactions, rashes  ALLERGIES: Allergies  Allergen Reactions  . Codeine      nausea  . Penicillins     Swelling, rash  . Sulfa Antibiotics     HOME MEDICATIONS: Outpatient Prescriptions Prior to Visit  Medication Sig Dispense Refill  . allopurinol (ZYLOPRIM) 100 MG tablet Take 100 mg by mouth daily.      Marland Kitchen aspirin 81 MG tablet Take 81 mg by mouth daily.      Marland Kitchen atenolol (TENORMIN) 50 MG tablet     . Cholecalciferol (VITAMIN D3) 1000 UNITS tablet Take 1,000 Units by mouth daily.      . famotidine (PEPCID) 20 MG tablet One at bedtime    . felodipine (PLENDIL) 5 MG 24 hr tablet Take 5 mg by mouth daily.      . fexofenadine (ALLEGRA) 180 MG tablet Take 180 mg by mouth daily.    . Fiber CAPS Take 1 capsule by mouth daily.      Marland Kitchen lamoTRIgine (LAMICTAL) 150 MG tablet Take 2 tablets (300 mg total) by mouth 2 (two) times daily. 120 tablet 11  . meloxicam (MOBIC) 15 MG tablet Take 15 mg by mouth.    . minoxidil (LONITEN) 10 MG tablet Take 40 mg by mouth daily.     . Multiple Vitamin (MULTIVITAMIN) tablet Take 1 tablet by mouth daily.      . prednisoLONE acetate (PRED FORTE) 1 % ophthalmic suspension Place 1 drop into the left eye at bedtime.     . simvastatin (ZOCOR) 20 MG tablet Take 20 mg by mouth daily.    Marland Kitchen zolpidem (AMBIEN) 10 MG tablet Take 10 mg by mouth at bedtime as  needed.      . meclizine (ANTIVERT) 25 MG tablet Take 1 tablet (25 mg total) by mouth 3 (three) times daily as needed for dizziness. (Patient not taking: Reported on 01/26/2015) 30 tablet 0  . pantoprazole (PROTONIX) 40 MG tablet Take 1 tablet (40 mg total) by mouth daily. Take 30-60 min before first meal of the day (Patient not taking: Reported on 01/26/2015) 30 tablet 2   No facility-administered medications prior to visit.  PAST MEDICAL HISTORY: Past Medical History  Diagnosis Date  . Pneumonia   . OSA (obstructive sleep apnea)   . History of prostate cancer Feb 1993  . Elevated diaphragm   . Normocytic anemia   . Hypoxemia   . Hypertension   . Gout   . Peripheral neuropathy   . Hyperlipidemia   . Cancer   . Vision abnormalities     PAST SURGICAL HISTORY: Past Surgical History  Procedure Laterality Date  . Appendectomy    . Prostatectomy    . Shoulder surgery      bone spur removed from right    FAMILY HISTORY: Family History  Problem Relation Age of Onset  . Emphysema Father   . Emphysema Brother   . Asthma Daughter   . Congestive Heart Failure Mother     SOCIAL HISTORY:  History   Social History  . Marital Status: Married    Spouse Name: N/A  . Number of Children: 2  . Years of Education: N/A   Occupational History  . Retired     Company secretary   Social History Main Topics  . Smoking status: Never Smoker   . Smokeless tobacco: Never Used  . Alcohol Use: Yes     Comment: occasionally  . Drug Use: No  . Sexual Activity: Not on file   Other Topics Concern  . Not on file   Social History Narrative     PHYSICAL EXAM  Filed Vitals:   01/26/15 1139  BP: 166/74  Pulse: 66  Resp: 16  Height: 5\' 11"  (1.803 m)  Weight: 184 lb 9.6 oz (83.734 kg)    Body mass index is 25.76 kg/(m^2).   General: The patient is well-developed and well-nourished and in no acute distress   Neurologic Exam  Mental status: The patient is alert and oriented x 3 at  the time of the examination. The patient has apparent normal recent and remote memory, with an apparently normal attention span and concentration ability.   Speech is normal.  Cranial nerves: Extraocular movements are full. The left pupil is larger than the right and has reduced reactivity. .  Facial symmetry is present. There is good facial sensation to soft touch bilaterally.Facial strength is normal.  Trapezius and sternocleidomastoid strength is normal. No dysarthria is noted.  Hearing is symmetric.   Motor:  Muscle bulk is reduecd in the calves.   Muscle tone is normal. Strength is  5 / 5 in all 4 extremities.  He is able to squat and rise. He can get out of a chair 5 times without using his arms. He has difficulty walking on his heels or toes.  Sensory: Sensory testing is intact to pinprick, soft touch, vibration sensation, and position sense in the arms but he has reduced vibration sensation in the upper toes and mildly at the ankles compared to the knees. He has a gradient of temperature sensation in the shin. As decreased touch sensation in his toes  and foot.   Coordination: Cerebellar testing reveals good finger-nose-finger  Gait and station: Station is stable with the eyes open but unstable with his eyes closed.  His gait is better when he looks down at his feet as he walks. He is unable to do a tandem walk when he looks up or with his eyes closed but is able to do tandem walk if he looks at his feet.  Reflexes: Deep tendon reflexes are increased with spread at the knees    DIAGNOSTIC DATA (  LABS, IMAGING, TESTING) - I reviewed patient records, labs, notes, testing and imaging myself where available.  Lab Results  Component Value Date   WBC 6.0 01/24/2015   HGB 12.7* 01/24/2015   HCT 38.9* 01/24/2015   MCV 88.4 01/24/2015   PLT 196 01/24/2015      Component Value Date/Time   NA 140 01/24/2015 1540   K 4.1 01/24/2015 1540   CL 103 01/24/2015 1540   CO2 27 01/24/2015 1540    GLUCOSE 133* 01/24/2015 1540   BUN 16 01/24/2015 1540   CREATININE 0.95 01/24/2015 1540   CALCIUM 9.3 01/24/2015 1540   PROT 7.6 01/24/2015 1540   ALBUMIN 4.0 01/24/2015 1540   AST 18 01/24/2015 1540   ALT 12* 01/24/2015 1540   ALKPHOS 98 01/24/2015 1540   BILITOT 0.5 01/24/2015 1540   GFRNONAA >60 01/24/2015 1540   GFRAA >60 01/24/2015 1540     STUDY DATE: 01/22/2015 PATIENT NAME: CARMELLO [name] DOB: 08-22-1945 MRN: [id number]  EXAM: MR angiogram of the intracranial arteries  ORDERING CLINICIAN:  Richard A. Sater, MD. PhD CLINICAL HISTORY: 69 year old man with vertical and transient ischemic attack COMPARISON FILMS: None  TECHNIQUE: MR angiogram of the head was obtained utilizing 3D time of flight sequences from below the vertebrobasilar junction up to the intracranial vasculature without contrast. Computerized reconstructions were obtained. CONTRAST: None IMAGING SITE: [facility], [place]  FINDINGS: Bilateral internal carotid arteries have increased caliber consistent with dolichoectasia. No significant stenosis is noted within them. The anterior cerebral arteries and left middle cerebral artery appear normal. Minimal stenosis is noted in the right M3 segment of the MCA area this is unlikely to be hemodynamically significant. In the posterior circulation, the right vertebral artery is dominant. Minimal patchy stenosis is noted within the left vertebral artery though this is unlikely to be hemodynamically significant. The right vertebral artery and basilar artery have normal flow. Incidental note is made of fetal origins of both of the posterior cerebral arteries. No aneurysms were identified.    IMPRESSION: This MR angiogram of the intracranial circulation shows the following: 1. Dolichoectasia of the internal carotid arteries. There is no stenosis noted within these arteries.  2. Minimal to mild stenosis of the M3 segment of the right middle cerebral  artery and the left vertebral artery. These do not appear to be hemodynamically significant. 3. Fetal origins of both of the posterior cerebral arteries from the anterior circulation. This is a normal variant.       ASSESSMENT AND PLAN  Dizziness and giddiness - Plan: Cardiac event monitor  Other specified transient cerebral ischemias - Plan: Cardiac event monitor  Near syncope  Polyneuropathy  Essential tremor     1.   The etiology of his episode of his most recent episode is not clear. Event from 3 weeks ago was more worrisome for TIA as he had vertical and felt his eyes jerking. Or, the most recent episode is much more consistent with a cardiac etiology 0 no neurologic symptoms, it was preceded by lightheadedness and he had complete recall of the event. His episode the previous day was more nonspecific. We need to obtain an event monitor to determine if he is having some of them might be explaining his symptoms. Hopefully, going to 30 days of monitoring he will have at least 1 event that can be correlated with EKG.  2.  Continue atenolol for tremor and Lamictal for dysesthetic pain. 3.  He will return to clinic in 3  Months or sooner if he has new or worsening neurologic symptoms.  Richard A. Felecia Shelling, MD, PhD 1/96/2229, 79:89 AM Certified in Neurology, Clinical Neurophysiology, Sleep Medicine, Pain Medicine and Neuroimaging  [facility] [address] [place], [place] [address] [phone]

## 2015-01-27 ENCOUNTER — Ambulatory Visit (INDEPENDENT_AMBULATORY_CARE_PROVIDER_SITE_OTHER): Payer: Medicare Other

## 2015-01-27 ENCOUNTER — Telehealth: Payer: Self-pay | Admitting: Neurology

## 2015-01-27 DIAGNOSIS — R42 Dizziness and giddiness: Secondary | ICD-10-CM

## 2015-01-27 DIAGNOSIS — G458 Other transient cerebral ischemic attacks and related syndromes: Secondary | ICD-10-CM | POA: Diagnosis not present

## 2015-01-27 NOTE — Telephone Encounter (Signed)
I have spoken with [name] this morning--he received a call from [name] to schedule holter monitor--and he has additional questions regarding holter monitor.  I have advised he call cardiology back with these questions/fim

## 2015-01-27 NOTE — Telephone Encounter (Signed)
Patient called and requested to speak with Faith RN regarding a procedure he would like to discuss. No additional details given. Please call and advise.

## 2015-01-28 DIAGNOSIS — R42 Dizziness and giddiness: Secondary | ICD-10-CM | POA: Diagnosis not present

## 2015-01-28 DIAGNOSIS — G459 Transient cerebral ischemic attack, unspecified: Secondary | ICD-10-CM | POA: Diagnosis not present

## 2015-02-04 ENCOUNTER — Ambulatory Visit: Payer: Medicare Other | Admitting: Pulmonary Disease

## 2015-03-02 ENCOUNTER — Telehealth: Payer: Self-pay | Admitting: *Deleted

## 2015-03-02 ENCOUNTER — Ambulatory Visit: Payer: Medicare Other | Admitting: Pulmonary Disease

## 2015-03-02 NOTE — Telephone Encounter (Signed)
-----   Message from Britt Bottom, MD sent at 03/02/2015  8:35 AM EDT ----- Please let Mr. Maret know that the event monitor was normal.

## 2015-03-02 NOTE — Telephone Encounter (Signed)
I have spoken with [name] this morning and per RAS, advised that event monitor was normal.  He verbalized understanding of same/fim

## 2015-03-02 NOTE — Telephone Encounter (Signed)
I have spoken with [name] this morning and per RAS, advised him that event monitor was normal.  He verbalized understanding of same/fim

## 2015-03-02 NOTE — Telephone Encounter (Signed)
-----   Message from Britt Bottom, MD sent at 03/02/2015  8:35 AM EDT ----- Please let [name] know that the event monitor was normal.

## 2015-04-04 DIAGNOSIS — H25812 Combined forms of age-related cataract, left eye: Secondary | ICD-10-CM | POA: Diagnosis not present

## 2015-04-04 DIAGNOSIS — I10 Essential (primary) hypertension: Secondary | ICD-10-CM | POA: Diagnosis not present

## 2015-04-04 DIAGNOSIS — H18603 Keratoconus, unspecified, bilateral: Secondary | ICD-10-CM | POA: Diagnosis not present

## 2015-04-04 DIAGNOSIS — H34831 Tributary (branch) retinal vein occlusion, right eye: Secondary | ICD-10-CM | POA: Diagnosis not present

## 2015-04-04 DIAGNOSIS — Z947 Corneal transplant status: Secondary | ICD-10-CM | POA: Diagnosis not present

## 2015-04-04 DIAGNOSIS — Z7982 Long term (current) use of aspirin: Secondary | ICD-10-CM | POA: Diagnosis not present

## 2015-04-26 DIAGNOSIS — Z01818 Encounter for other preprocedural examination: Secondary | ICD-10-CM | POA: Diagnosis not present

## 2015-04-26 DIAGNOSIS — Z8669 Personal history of other diseases of the nervous system and sense organs: Secondary | ICD-10-CM | POA: Diagnosis not present

## 2015-05-03 DIAGNOSIS — I10 Essential (primary) hypertension: Secondary | ICD-10-CM | POA: Diagnosis not present

## 2015-05-03 DIAGNOSIS — E780 Pure hypercholesterolemia: Secondary | ICD-10-CM | POA: Diagnosis not present

## 2015-05-03 DIAGNOSIS — Z0000 Encounter for general adult medical examination without abnormal findings: Secondary | ICD-10-CM | POA: Diagnosis not present

## 2015-05-05 DIAGNOSIS — J387 Other diseases of larynx: Secondary | ICD-10-CM | POA: Diagnosis not present

## 2015-05-05 DIAGNOSIS — Z8781 Personal history of (healed) traumatic fracture: Secondary | ICD-10-CM | POA: Diagnosis not present

## 2015-05-05 DIAGNOSIS — R42 Dizziness and giddiness: Secondary | ICD-10-CM | POA: Diagnosis not present

## 2015-05-05 DIAGNOSIS — C61 Malignant neoplasm of prostate: Secondary | ICD-10-CM | POA: Diagnosis not present

## 2015-05-05 DIAGNOSIS — Z1212 Encounter for screening for malignant neoplasm of rectum: Secondary | ICD-10-CM | POA: Diagnosis not present

## 2015-05-05 DIAGNOSIS — B351 Tinea unguium: Secondary | ICD-10-CM | POA: Diagnosis not present

## 2015-05-05 DIAGNOSIS — Z23 Encounter for immunization: Secondary | ICD-10-CM | POA: Diagnosis not present

## 2015-05-16 DIAGNOSIS — Z87828 Personal history of other (healed) physical injury and trauma: Secondary | ICD-10-CM | POA: Diagnosis not present

## 2015-05-16 DIAGNOSIS — G4733 Obstructive sleep apnea (adult) (pediatric): Secondary | ICD-10-CM | POA: Diagnosis not present

## 2015-05-16 DIAGNOSIS — I10 Essential (primary) hypertension: Secondary | ICD-10-CM | POA: Diagnosis not present

## 2015-05-16 DIAGNOSIS — H25812 Combined forms of age-related cataract, left eye: Secondary | ICD-10-CM | POA: Diagnosis not present

## 2015-05-16 DIAGNOSIS — H348312 Tributary (branch) retinal vein occlusion, right eye, stable: Secondary | ICD-10-CM | POA: Diagnosis not present

## 2015-05-16 DIAGNOSIS — Z9989 Dependence on other enabling machines and devices: Secondary | ICD-10-CM | POA: Diagnosis not present

## 2015-05-16 DIAGNOSIS — Z79899 Other long term (current) drug therapy: Secondary | ICD-10-CM | POA: Diagnosis not present

## 2015-05-16 DIAGNOSIS — Z7982 Long term (current) use of aspirin: Secondary | ICD-10-CM | POA: Diagnosis not present

## 2015-06-24 DIAGNOSIS — Z885 Allergy status to narcotic agent status: Secondary | ICD-10-CM | POA: Diagnosis not present

## 2015-06-24 DIAGNOSIS — Z79899 Other long term (current) drug therapy: Secondary | ICD-10-CM | POA: Diagnosis not present

## 2015-06-24 DIAGNOSIS — Z8546 Personal history of malignant neoplasm of prostate: Secondary | ICD-10-CM | POA: Diagnosis not present

## 2015-06-24 DIAGNOSIS — I10 Essential (primary) hypertension: Secondary | ICD-10-CM | POA: Diagnosis not present

## 2015-06-24 DIAGNOSIS — Z947 Corneal transplant status: Secondary | ICD-10-CM | POA: Diagnosis not present

## 2015-06-24 DIAGNOSIS — H25812 Combined forms of age-related cataract, left eye: Secondary | ICD-10-CM | POA: Diagnosis not present

## 2015-06-24 DIAGNOSIS — Z7982 Long term (current) use of aspirin: Secondary | ICD-10-CM | POA: Diagnosis not present

## 2015-06-24 DIAGNOSIS — H18603 Keratoconus, unspecified, bilateral: Secondary | ICD-10-CM | POA: Diagnosis not present

## 2015-06-24 DIAGNOSIS — Z882 Allergy status to sulfonamides status: Secondary | ICD-10-CM | POA: Diagnosis not present

## 2015-06-24 DIAGNOSIS — Z881 Allergy status to other antibiotic agents status: Secondary | ICD-10-CM | POA: Diagnosis not present

## 2015-06-24 DIAGNOSIS — Z880 Allergy status to penicillin: Secondary | ICD-10-CM | POA: Diagnosis not present

## 2015-06-24 DIAGNOSIS — M109 Gout, unspecified: Secondary | ICD-10-CM | POA: Diagnosis not present

## 2015-06-24 DIAGNOSIS — G629 Polyneuropathy, unspecified: Secondary | ICD-10-CM | POA: Diagnosis not present

## 2015-08-03 DIAGNOSIS — Z1211 Encounter for screening for malignant neoplasm of colon: Secondary | ICD-10-CM | POA: Diagnosis not present

## 2015-08-03 DIAGNOSIS — R1030 Lower abdominal pain, unspecified: Secondary | ICD-10-CM | POA: Diagnosis not present

## 2015-08-11 ENCOUNTER — Other Ambulatory Visit: Payer: Self-pay

## 2015-08-11 DIAGNOSIS — Z1211 Encounter for screening for malignant neoplasm of colon: Secondary | ICD-10-CM | POA: Diagnosis not present

## 2015-08-11 MED ORDER — LAMOTRIGINE 150 MG PO TABS: 300.0000 mg | ORAL_TABLET | Freq: Two times a day (BID) | ORAL | Status: DC

## 2015-09-01 DIAGNOSIS — R1030 Lower abdominal pain, unspecified: Secondary | ICD-10-CM | POA: Diagnosis not present

## 2015-09-01 DIAGNOSIS — K5900 Constipation, unspecified: Secondary | ICD-10-CM | POA: Diagnosis not present

## 2015-09-01 DIAGNOSIS — R358 Other polyuria: Secondary | ICD-10-CM | POA: Diagnosis not present

## 2015-09-01 DIAGNOSIS — Z8546 Personal history of malignant neoplasm of prostate: Secondary | ICD-10-CM | POA: Diagnosis not present

## 2015-09-01 DIAGNOSIS — R3916 Straining to void: Secondary | ICD-10-CM | POA: Diagnosis not present

## 2015-09-06 DIAGNOSIS — R102 Pelvic and perineal pain: Secondary | ICD-10-CM | POA: Diagnosis not present

## 2015-09-06 DIAGNOSIS — Z0000 Encounter for general adult medical examination without abnormal findings: Secondary | ICD-10-CM | POA: Diagnosis not present

## 2015-09-06 DIAGNOSIS — Z8546 Personal history of malignant neoplasm of prostate: Secondary | ICD-10-CM | POA: Diagnosis not present

## 2015-09-12 ENCOUNTER — Ambulatory Visit: Payer: PRIVATE HEALTH INSURANCE | Admitting: Neurology

## 2015-09-15 ENCOUNTER — Ambulatory Visit (INDEPENDENT_AMBULATORY_CARE_PROVIDER_SITE_OTHER): Payer: Medicare Other | Admitting: Neurology

## 2015-09-15 ENCOUNTER — Encounter: Payer: Self-pay | Admitting: Neurology

## 2015-09-15 VITALS — BP 144/80 | HR 66 | Resp 18 | Ht 71.0 in | Wt 193.6 lb

## 2015-09-15 DIAGNOSIS — G458 Other transient cerebral ischemic attacks and related syndromes: Secondary | ICD-10-CM

## 2015-09-15 DIAGNOSIS — G629 Polyneuropathy, unspecified: Secondary | ICD-10-CM | POA: Diagnosis not present

## 2015-09-15 DIAGNOSIS — G250 Essential tremor: Secondary | ICD-10-CM | POA: Diagnosis not present

## 2015-09-15 DIAGNOSIS — R55 Syncope and collapse: Secondary | ICD-10-CM | POA: Diagnosis not present

## 2015-09-15 MED ORDER — TRAMADOL HCL 50 MG PO TABS: ORAL_TABLET | ORAL | Status: DC

## 2015-09-15 NOTE — Progress Notes (Signed)
[facility]  PATIENT: [name] DOB: [birth year]  REFERRING CLINICIAN: Deland Pretty HISTORY FROM: Patient REASON FOR VISIT: Polyneuropathy and tremors   HISTORICAL  CHIEF COMPLAINT:  Chief Complaint  Patient presents with  . Dizziness    Sts. he has had no futher episodes of dizziness.  He wore an event monitor for 30 days; no problems were noted.  Sts. tremors in hands are about the same.   Sts. dysesthesias remain improved with Lamictal.  He ran out of Lamictal for a few days, and pain worsened, so he is sure it is helping./fim  . Tremors  . Dysesthesias    HISTORY OF PRESENT ILLNESS:  [name] is a 70 year old man with polyneuropathy/dysesthesias, tremors and pre-syncopal spells.  Pre-syncope/TIA?:    He denies any more events of lightheadedness/dizziness this last summer.   The event monitor was normal.    He denies any TIA type symptoms.     STROKE/TIA  RISK FACTORS:  He does not smoke.   He has had HTN x years (and BP usually good), hyperlipidemia (well controlled on simvastatin).    MRA shows mild intracranial stenosis.  He is on simvastatin and ASA  POLYNEUROPATHY:  He has persistent numbness in the toes over the past few years, unchanged this year.Marland Kitchen  His symptoms are worse at night. During the day, pain is less troubling but still present.   Lamotrigine 300 mg by mouth twice a day has helped the pain the most. He missed several days and pain was much worse.    Neurontin and a tricyclic antidepressant did not help the pain. Blood work including B12, cryoglobulins and electrophoresis had been essentially normal in the past, last tested in 2013. Nerve conduction study in the past was mildly abnormal but more recent one showed good functioning of the large fibers.  TREMOR:  He has an essential tremor in both hands, worse on his left. The tremor is worse when he tries to put in his contacts and when upset or anxious. He has done best on atenolol with  some control of the tremor.   OSA:   He has sleep apnea and uses CPAP nightly. He feels more refreshed during the day and is less likely to doze off on CPAP that he was before he started using it. He no longer needs nocturnal oxygen.   He denies excessive daytime sleepiness.     If he has trouble falling asleep, he takes an Ambien.      REVIEW OF SYSTEMS:  Constitutional: No fevers, chills, sweats, or change in appetite Eyes: No visual changes, double vision, eye pain Ear, nose and throat: No hearing loss, ear pain, nasal congestion, sore throat Cardiovascular: No chest pain, palpitations Respiratory:  No shortness of breath at rest or with exertion.   No wheezes   He has OSA and is on CPAP GastrointestinaI: No nausea, vomiting, diarrhea, abdominal pain, fecal incontinence Genitourinary:  No dysuria, urinary retention or frequency.  No nocturia. Musculoskeletal:  No neck pain, back pain Integumentary: No rash, pruritus, skin lesions Neurological: as above Psychiatric: No depression at this time.  No anxiety Endocrine: No palpitations, diaphoresis, change in appetite, change in weigh or increased thirst Hematologic/Lymphatic:  No anemia, purpura, petechiae. Allergic/Immunologic: No itchy/runny eyes, nasal congestion, recent allergic reactions, rashes  ALLERGIES: Allergies  Allergen Reactions  . Codeine      nausea  . Penicillins     Swelling, rash  . Sulfa Antibiotics     HOME  MEDICATIONS: Outpatient Prescriptions Prior to Visit  Medication Sig Dispense Refill  . allopurinol (ZYLOPRIM) 100 MG tablet Take 100 mg by mouth daily.      Marland Kitchen aspirin 81 MG tablet Take 81 mg by mouth daily.      Marland Kitchen atenolol (TENORMIN) 50 MG tablet     . Cholecalciferol (VITAMIN D3) 1000 UNITS tablet Take 1,000 Units by mouth daily.      . famotidine (PEPCID) 20 MG tablet One at bedtime    . felodipine (PLENDIL) 5 MG 24 hr tablet Take 5 mg by mouth daily.      . fexofenadine (ALLEGRA) 180 MG tablet Take  180 mg by mouth daily.    . Fiber CAPS Take 1 capsule by mouth daily.      Marland Kitchen lamoTRIgine (LAMICTAL) 150 MG tablet Take 2 tablets (300 mg total) by mouth 2 (two) times daily. 120 tablet 6  . meclizine (ANTIVERT) 25 MG tablet Take 1 tablet (25 mg total) by mouth 3 (three) times daily as needed for dizziness. (Patient not taking: Reported on 01/26/2015) 30 tablet 0  . minoxidil (LONITEN) 10 MG tablet Take 40 mg by mouth daily.     . Multiple Vitamin (MULTIVITAMIN) tablet Take 1 tablet by mouth daily.      . pantoprazole (PROTONIX) 40 MG tablet Take 1 tablet (40 mg total) by mouth daily. Take 30-60 min before first meal of the day (Patient not taking: Reported on 01/26/2015) 30 tablet 2  . prednisoLONE acetate (PRED FORTE) 1 % ophthalmic suspension Place 1 drop into the left eye at bedtime.     . simvastatin (ZOCOR) 20 MG tablet Take 20 mg by mouth daily.    Marland Kitchen zolpidem (AMBIEN) 10 MG tablet Take 10 mg by mouth at bedtime as needed.       No facility-administered medications prior to visit.    PAST MEDICAL HISTORY: Past Medical History  Diagnosis Date  . Pneumonia   . OSA (obstructive sleep apnea)   . History of prostate cancer Feb 1993  . Elevated diaphragm   . Normocytic anemia   . Hypoxemia   . Hypertension   . Gout   . Peripheral neuropathy ([place])   . Hyperlipidemia   . Cancer ([place])   . Vision abnormalities     PAST SURGICAL HISTORY: Past Surgical History  Procedure Laterality Date  . Appendectomy    . Prostatectomy    . Shoulder surgery      bone spur removed from right    FAMILY HISTORY: Family History  Problem Relation Age of Onset  . Emphysema Father   . Emphysema Brother   . Asthma Daughter   . Congestive Heart Failure Mother     SOCIAL HISTORY:  Social History   Social History  . Marital Status: Married    Spouse Name: N/A  . Number of Children: 2  . Years of Education: N/A   Occupational History  . Retired     Company secretary   Social History Main Topics  .  Smoking status: Never Smoker   . Smokeless tobacco: Never Used  . Alcohol Use: Yes     Comment: occasionally  . Drug Use: No  . Sexual Activity: Not on file   Other Topics Concern  . Not on file   Social History Narrative     PHYSICAL EXAM  Filed Vitals:   09/15/15 1448  BP: 144/80  Pulse: 66  Resp: 18  Height: 5\' 11"  (1.803 m)  Weight: 193 lb  9.6 oz (87.816 kg)    Body mass index is 27.01 kg/(m^2).   General: The patient is well-developed and well-nourished and in no acute distress   Neurologic Exam  Mental status: The patient is alert and oriented x 3 at the time of the examination. The patient has apparent normal recent and remote memory, with an apparently normal attention span and concentration ability.   Speech is normal.  Cranial nerves: Extraocular movements are full. The left pupil is larger than the right and has reduced reactivity. .  Facial symmetry is present. .Facial strength is normal.  Trapezius and sternocleidomastoid strength is normal. No dysarthria is noted.  Hearing is symmetric.   Motor:  Muscle bulk is reduecd in the calves.   Muscle tone is normal. Strength is  5 / 5 in all 4 extremities.  He is able to squat and rise. He can get out of a chair 5 times without using his arms. He has difficulty walking on his heels or toes.  Sensory: Sensory testing is intact to pinprick, soft touch, vibration sensation, and position sense in the arms but he has reduced vibration sensation in the upper toes and mildly at the ankles compared to the knees. He has a gradient of temperature sensation in the shin. As decreased touch sensation in his toes  and foot.   Coordination: Cerebellar testing reveals good finger-nose-finger  Gait and station: Station is stable with the eyes open but unstable with his eyes closed.  His gait is better when he looks down at his feet as he walks. He is unable to do a tandem walk when he looks up or with his eyes closed but is able to do  tandem walk if he looks at his feet.  Reflexes: Deep tendon reflexes are increased with spread at the knees    DIAGNOSTIC DATA (LABS, IMAGING, TESTING) - I reviewed patient records, labs, notes, testing and imaging myself where available.  Lab Results  Component Value Date   WBC 6.0 01/24/2015   HGB 12.7* 01/24/2015   HCT 38.9* 01/24/2015   MCV 88.4 01/24/2015   PLT 196 01/24/2015      Component Value Date/Time   NA 140 01/24/2015 1540   K 4.1 01/24/2015 1540   CL 103 01/24/2015 1540   CO2 27 01/24/2015 1540   GLUCOSE 133* 01/24/2015 1540   BUN 16 01/24/2015 1540   CREATININE 0.95 01/24/2015 1540   CALCIUM 9.3 01/24/2015 1540   PROT 7.6 01/24/2015 1540   ALBUMIN 4.0 01/24/2015 1540   AST 18 01/24/2015 1540   ALT 12* 01/24/2015 1540   ALKPHOS 98 01/24/2015 1540   BILITOT 0.5 01/24/2015 1540   GFRNONAA >60 01/24/2015 1540   GFRAA >60 01/24/2015 1540      ASSESSMENT AND PLAN  Polyneuropathy (HCC)  Essential tremor  Near syncope  Other specified transient cerebral ischemias    1.   Continue Lamictal, add prn tramadol 2.   Continue atenolol for tremor, consider primidone if it worsens and affects functioning. 3.   RTC 6 months, sooner if problems  Cashton Hosley A. Felecia Shelling, MD, PhD [id number], [id number] PM Certified in Neurology, Clinical Neurophysiology, Sleep Medicine, Pain Medicine and Neuroimaging  [facility] [address], [place], [place] [address] [phone]

## 2015-09-26 DIAGNOSIS — M461 Sacroiliitis, not elsewhere classified: Secondary | ICD-10-CM | POA: Diagnosis not present

## 2015-09-26 DIAGNOSIS — M47816 Spondylosis without myelopathy or radiculopathy, lumbar region: Secondary | ICD-10-CM | POA: Diagnosis not present

## 2015-09-26 DIAGNOSIS — M5136 Other intervertebral disc degeneration, lumbar region: Secondary | ICD-10-CM | POA: Diagnosis not present

## 2015-09-28 DIAGNOSIS — D225 Melanocytic nevi of trunk: Secondary | ICD-10-CM | POA: Diagnosis not present

## 2015-09-28 DIAGNOSIS — I781 Nevus, non-neoplastic: Secondary | ICD-10-CM | POA: Diagnosis not present

## 2015-09-28 DIAGNOSIS — X32XXXD Exposure to sunlight, subsequent encounter: Secondary | ICD-10-CM | POA: Diagnosis not present

## 2015-09-28 DIAGNOSIS — L570 Actinic keratosis: Secondary | ICD-10-CM | POA: Diagnosis not present

## 2015-10-31 DIAGNOSIS — M461 Sacroiliitis, not elsewhere classified: Secondary | ICD-10-CM | POA: Diagnosis not present

## 2015-10-31 DIAGNOSIS — M5136 Other intervertebral disc degeneration, lumbar region: Secondary | ICD-10-CM | POA: Diagnosis not present

## 2015-10-31 DIAGNOSIS — M47816 Spondylosis without myelopathy or radiculopathy, lumbar region: Secondary | ICD-10-CM | POA: Diagnosis not present

## 2015-11-01 DIAGNOSIS — I10 Essential (primary) hypertension: Secondary | ICD-10-CM | POA: Diagnosis not present

## 2015-11-01 DIAGNOSIS — J387 Other diseases of larynx: Secondary | ICD-10-CM | POA: Diagnosis not present

## 2015-11-01 DIAGNOSIS — M109 Gout, unspecified: Secondary | ICD-10-CM | POA: Diagnosis not present

## 2015-11-01 DIAGNOSIS — G629 Polyneuropathy, unspecified: Secondary | ICD-10-CM | POA: Diagnosis not present

## 2015-11-14 DIAGNOSIS — Z961 Presence of intraocular lens: Secondary | ICD-10-CM | POA: Diagnosis not present

## 2015-11-14 DIAGNOSIS — Z7982 Long term (current) use of aspirin: Secondary | ICD-10-CM | POA: Diagnosis not present

## 2015-11-14 DIAGNOSIS — T85612A Breakdown (mechanical) of permanent sutures, initial encounter: Secondary | ICD-10-CM | POA: Diagnosis not present

## 2015-11-14 DIAGNOSIS — Z880 Allergy status to penicillin: Secondary | ICD-10-CM | POA: Diagnosis not present

## 2015-11-14 DIAGNOSIS — H2511 Age-related nuclear cataract, right eye: Secondary | ICD-10-CM | POA: Diagnosis not present

## 2015-11-14 DIAGNOSIS — H01004 Unspecified blepharitis left upper eyelid: Secondary | ICD-10-CM | POA: Diagnosis not present

## 2015-11-14 DIAGNOSIS — Z79899 Other long term (current) drug therapy: Secondary | ICD-10-CM | POA: Diagnosis not present

## 2015-11-14 DIAGNOSIS — Z9889 Other specified postprocedural states: Secondary | ICD-10-CM | POA: Diagnosis not present

## 2015-11-14 DIAGNOSIS — Z9842 Cataract extraction status, left eye: Secondary | ICD-10-CM | POA: Diagnosis not present

## 2015-11-14 DIAGNOSIS — Z882 Allergy status to sulfonamides status: Secondary | ICD-10-CM | POA: Diagnosis not present

## 2015-11-14 DIAGNOSIS — H18603 Keratoconus, unspecified, bilateral: Secondary | ICD-10-CM | POA: Diagnosis not present

## 2015-11-14 DIAGNOSIS — Z947 Corneal transplant status: Secondary | ICD-10-CM | POA: Diagnosis not present

## 2015-11-14 DIAGNOSIS — H01009 Unspecified blepharitis unspecified eye, unspecified eyelid: Secondary | ICD-10-CM | POA: Diagnosis not present

## 2015-11-14 DIAGNOSIS — I10 Essential (primary) hypertension: Secondary | ICD-10-CM | POA: Diagnosis not present

## 2015-11-14 DIAGNOSIS — H04123 Dry eye syndrome of bilateral lacrimal glands: Secondary | ICD-10-CM | POA: Diagnosis not present

## 2015-11-14 DIAGNOSIS — H01001 Unspecified blepharitis right upper eyelid: Secondary | ICD-10-CM | POA: Diagnosis not present

## 2015-11-14 DIAGNOSIS — Z885 Allergy status to narcotic agent status: Secondary | ICD-10-CM | POA: Diagnosis not present

## 2015-12-27 ENCOUNTER — Ambulatory Visit (INDEPENDENT_AMBULATORY_CARE_PROVIDER_SITE_OTHER): Payer: Medicare Other | Admitting: Internal Medicine

## 2015-12-27 ENCOUNTER — Encounter: Payer: Self-pay | Admitting: Internal Medicine

## 2015-12-27 ENCOUNTER — Ambulatory Visit (INDEPENDENT_AMBULATORY_CARE_PROVIDER_SITE_OTHER)
Admission: RE | Admit: 2015-12-27 | Discharge: 2015-12-27 | Disposition: A | Discharge status: Home / Self Care | Payer: Medicare Other | Source: Ambulatory Visit | Attending: Internal Medicine | Admitting: Internal Medicine

## 2015-12-27 VITALS — BP 138/74 | HR 64 | Temp 98.9°F | Ht 71.0 in | Wt 189.0 lb

## 2015-12-27 DIAGNOSIS — J479 Bronchiectasis, uncomplicated: Secondary | ICD-10-CM

## 2015-12-27 DIAGNOSIS — R05 Cough: Secondary | ICD-10-CM | POA: Diagnosis not present

## 2015-12-27 MED ORDER — AZITHROMYCIN 250 MG PO TABS: ORAL_TABLET | ORAL | Status: DC

## 2015-12-27 NOTE — Progress Notes (Signed)
Quick Note:  Spoke with pt and notified of results per Dr. [name]. Pt verbalized understanding and denied any questions.  ______ 

## 2015-12-27 NOTE — Patient Instructions (Signed)
zpak should turn the mucus lighter, if not call us  For cough > mucinex dm up to 1200 mg every 12 hours and can supplement with tramadol 50 mg up to every 4 hours as needed  Please remember to go to the x-ray department downstairs for your tests - we will call you with the results when they are available.  Follow up is as needed

## 2015-12-27 NOTE — Progress Notes (Signed)
Subjective:     Patient ID: [name], male   DOB: December 01, 1945    MRN: [id number]    Brief patient profile:  67 yowm never smoker recurrent R flank pain x around 2005 made worse by lying down on R side with worst ever episode around early Feb 2016 > eval including cxr showing ? Nodules and CT c/w bronchiectasis so referred to pulmonary clinic 10/08/14  by Glenn Deland Pretty to sort out.   Note previously seen by our service in 2012:  DATE OF ADMISSION: 02/06/2011 DATE OF DISCHARGE: 02/12/2011  FINAL DIAGNOSES: 1. Healthcare-associated pneumonia with acute hypoxemia. 2. Obstructive sleep apnea, on CPAP. 3. History of prostate cancer. 4. Chronic kidney disease, stage III. 5. Chronically elevated diaphragm. 6. Normocytic anemia, probably anemia of critical illness  hemodilution. Will need followup outpatient.   10/08/2014 Pulmonary consultation/[name]   Chief Complaint  Patient presents with  . Advice Only    pulm nodules; no SOB, CP or cough  cp always present x 10 years whenever lies down on R side or pushes on R lat cw but is only mild and not produced by deep breath or cough  >>add mucinex dm As needed    11/08/2014 NP Acute OV  Pt complains of cough, tickle in throat and minimal congestion that is clear for last 3 days.  No significant nasal congestion or drainage .  No fever, discolored mucus, chest pain, hemoptysis, calf pain, or n/v/d.  Appetite is good.  No otc used for symptoms.  Leaving for [place] in couple of weeks with family , does not want to get sick.  No recent travel or abx use.  No family members sick.  >zpack   11/30/2014 NP Acute OV  Patient returns with no significant improvement in symptoms He complains of persistent cough that has been worse for the last 3-4 months. He complains of a rattling congested cough with thick mucus. Patient was given some Hydromet cough syrup which has helped slightly with his cough.Marland Kitchen He finished a Z-Pak. He has not seen  any significant improvement. Chest x-ray done last week with no signs of pneumonia. He denies any hemoptysis, orthopnea, PND or leg swelling Patient complains that he's had wheezing over the last 3 or 4 days. rec Levaquin 500mg  daily for 7 days  Prednisone taper over next week  Mucinex DM Twice daily  As needed  Cough/congestion    12/15/2014 f/u ov/[name] re: bronchiectasis  Chief Complaint  Patient presents with  . Follow-up    Pt states he feels better in general since last visit, but still coughing- sputum is now clear. He has not had any more wheezing.   Cough is more day than night and now more dry than wet but no purulent or bloody mucus rec Bronchiectasis =   you have scarring of your bronchial tubes   Whenever you develop cough congestion take mucinex or mucinex dm   Pantoprazole (protonix) 40 mg   Take 30-60 min before first meal of the day and Pepcid 20 mg one bedtime until return to office -  GERD (REFLUX) . Stop tenormin  Bisoprolol 5 mg twice dialy  Please schedule a follow up office visit in 6 weeks, call sooner if needed with pfts with [name] > never done   12/27/2015 acute extended ov/[name] re: mild bronchiectasis flare/ back on tenormin 50  Chief Complaint  Patient presents with  . Acute Visit    Pt c/o cough x 1 wk, started with  sore throat. Cough is prod in the am with dark yellow sputum. He states if he turns over in his sleep his cough wakes up up.    did start mucinex but not acid suppression  And has not received abx thus far Not limited by breathing from desired activities     No obvious day to day or daytime variabilty or assoc chronic cough or cp or chest tightness, subjective wheeze overt sinus or hb symptoms. No unusual exp hx or h/o childhood pna/ asthma or knowledge of premature birth.  Sleeping ok without nocturnal  or early am exacerbation  of respiratory  c/o's or need for noct saba. Also denies any obvious fluctuation of symptoms with weather or  environmental changes or other aggravating or alleviating factors except as outlined above   Current Medications, Allergies, Complete Past Medical History, Past Surgical History, Family History, and Social History were reviewed in [employer] record.  ROS  The following are not active complaints unless bolded sore throat, dysphagia, dental problems, itching, sneezing,  nasal congestion or excess/ purulent secretions, ear ache,   fever, chills, sweats, unintended wt loss, pleuritic or exertional cp, hemoptysis,  orthopnea pnd or leg swelling, presyncope, palpitations, heartburn, abdominal pain, anorexia, nausea, vomiting, diarrhea  or change in bowel or urinary habits, change in stools or urine, dysuria,hematuria,  rash, arthralgias, visual complaints, headache, numbness weakness or ataxia or problems with walking or coordination,  change in mood/affect or memory.            Objective:   Physical Exam amb wm nad   12/27/2015       189  12/15/14 189 lb 3.2 oz (85.821 kg)  12/14/14 180 lb 6.4 oz (81.829 kg)  11/30/14 198 lb 9.6 oz (90.084 kg)    Vital signs reviewed      HEENT: nl dentition, turbinates, and orophanx. Nl external ear canals without cough reflex   NECK :  without JVD/Nodes/TM/ nl carotid upstrokes bilaterally   LUNGS: no acc muscle use, CTA w/ no wheezing or rales    CV:  RRR  no s3 or murmur or increase in P2, no edema   ABD:  soft and nontender with nl excursion in the supine position. No bruits or organomegaly, bowel sounds nl  MS:  warm without deformities, calf tenderness, cyanosis or clubbing  SKIN: warm and dry without lesions    NEURO:  alert, approp, no deficits       CXR PA and Lateral:   12/27/2015 :    I personally reviewed images and agree with radiology impression as follows:    Low lung volumes with bibasilar atelectasis and/or scarring again noted. Mild lingular infiltrate cannot be excluded.     Assessment:

## 2015-12-30 NOTE — Assessment & Plan Note (Signed)
See CT chest 09/30/14  Really hard to tell if this is a true flare of bronchitiectasis or just uri at this point   rec mucinex max doses/ zpak and f/u if not back to baseline   I had an extended discussion with the patient reviewing all relevant studies completed to date and  lasting 15 to 20 minutes of a 25 minute acute office visit    Each maintenance medication was reviewed in detail including most importantly the difference between maintenance and prns and under what circumstances the prns are to be triggered using an action plan format that is not reflected in the computer generated alphabetically organized AVS.    Please see instructions for details which were reviewed in writing and the patient given a copy highlighting the part that I personally wrote and discussed at today's ov.

## 2016-01-06 DIAGNOSIS — Z7982 Long term (current) use of aspirin: Secondary | ICD-10-CM | POA: Diagnosis not present

## 2016-01-06 DIAGNOSIS — H2511 Age-related nuclear cataract, right eye: Secondary | ICD-10-CM | POA: Diagnosis not present

## 2016-01-06 DIAGNOSIS — Z880 Allergy status to penicillin: Secondary | ICD-10-CM | POA: Diagnosis not present

## 2016-01-06 DIAGNOSIS — Z947 Corneal transplant status: Secondary | ICD-10-CM | POA: Diagnosis not present

## 2016-01-06 DIAGNOSIS — Z8546 Personal history of malignant neoplasm of prostate: Secondary | ICD-10-CM | POA: Diagnosis not present

## 2016-01-06 DIAGNOSIS — H18603 Keratoconus, unspecified, bilateral: Secondary | ICD-10-CM | POA: Diagnosis not present

## 2016-01-06 DIAGNOSIS — I10 Essential (primary) hypertension: Secondary | ICD-10-CM | POA: Diagnosis not present

## 2016-01-06 DIAGNOSIS — H01009 Unspecified blepharitis unspecified eye, unspecified eyelid: Secondary | ICD-10-CM | POA: Diagnosis not present

## 2016-01-23 ENCOUNTER — Telehealth: Payer: Self-pay | Admitting: Internal Medicine

## 2016-01-23 MED ORDER — LEVOFLOXACIN 500 MG PO TABS: 500.0000 mg | ORAL_TABLET | Freq: Every day | ORAL | Status: DC

## 2016-01-23 NOTE — Telephone Encounter (Signed)
Spoke with pt, states he's producing mucus again, only 2-3 times daily.  Notes that when he produces mucus it is a dark yellow mucus- denies CP/tightness, sinus congestion, PND, fever.  S/s present X approx 4 days.   Pt uses [facility] [address].    MW please advise on recs.  Thanks!   Patient Instructions       zpak should turn the mucus lighter, if not call us  For cough > mucinex dm up to 1200 mg every 12 hours and can supplement with tramadol 50 mg up to every 4 hours as needed  Please remember to go to the x-ray department downstairs for your tests - we will call you with the results when they are available.  Follow up is as needed

## 2016-01-23 NOTE — Telephone Encounter (Signed)
levaquin 500 mg daily x 7 days 

## 2016-01-23 NOTE — Telephone Encounter (Signed)
Spoke with pt and advised of MW's recommendation. Pt agreed. Rx sent to pharmacy. Pt advised to call office if not improving. Nothing further needed.

## 2016-03-06 ENCOUNTER — Other Ambulatory Visit: Payer: Self-pay | Admitting: Neurology

## 2016-03-20 ENCOUNTER — Ambulatory Visit (INDEPENDENT_AMBULATORY_CARE_PROVIDER_SITE_OTHER): Payer: Medicare Other | Admitting: Neurology

## 2016-03-20 ENCOUNTER — Encounter: Payer: Self-pay | Admitting: Neurology

## 2016-03-20 VITALS — BP 126/72 | HR 70 | Resp 14 | Ht 71.0 in | Wt 194.5 lb

## 2016-03-20 DIAGNOSIS — G629 Polyneuropathy, unspecified: Secondary | ICD-10-CM

## 2016-03-20 DIAGNOSIS — G4733 Obstructive sleep apnea (adult) (pediatric): Secondary | ICD-10-CM

## 2016-03-20 DIAGNOSIS — R55 Syncope and collapse: Secondary | ICD-10-CM | POA: Diagnosis not present

## 2016-03-20 DIAGNOSIS — G250 Essential tremor: Secondary | ICD-10-CM

## 2016-03-20 MED ORDER — LAMOTRIGINE 150 MG PO TABS: 300.0000 mg | ORAL_TABLET | Freq: Two times a day (BID) | ORAL | 11 refills | Status: DC

## 2016-03-20 NOTE — Progress Notes (Signed)
[facility]  PATIENT: [name] DOB: 1946/06/27  REFERRING CLINICIAN: Deland Pretty HISTORY FROM: Patient REASON FOR VISIT: Polyneuropathy and tremors   HISTORICAL  CHIEF COMPLAINT:  Chief Complaint  Patient presents with  . Polyneuropathy    Sts. he continues Lamictal for polyneuropathy.  Has only had to take Tramadol a few times.  Thinks tremor is some worse; most noticeable when writing./fim  . Tremors    HISTORY OF PRESENT ILLNESS:  [name] is a 70 year old man with polyneuropathy/dysesthesias, tremors and pre-syncopal spells.  POLYNEUROPATHY:  He feels he is doing better with numbness but nopain in Lamotrigine.   He has not needed tramadol much.     Lamotrigine 300 mg by mouth twice a day has helped the pain.   Neurontin and a tricyclic antidepressant did not help the pain. Blood work including B12, cryoglobulins and electrophoresis had been essentially normal in the past, last tested in 2013. Nerve conduction study in the past was mildly abnormal but more recent one showed good functioning of the large fibers.  TREMOR:  He has an essential tremor in both hands, worse on his left. The tremor is worse when he writes, types or tries to put in his contacts and when upset or anxious. Atenolol helps some.       OSA/INSOMNIA:   He has sleep apnea and uses CPAP nightly. He tolerates it well and feels more refreshed during the day.   He is less likely to doze off on CPAP that he was before he started using it.    He denies excessive daytime sleepiness.     He gets occasional insomnia (sleep onset) and rarely takes an an Ambien.     PRESYNCOPE SPELLS:   He denies any more events of lightheadedness/dizziness.   The event monitor was normal.    He denies any TIA type symptoms.      MRA shows mild intracranial stenosis.  He is on simvastatin and ASA    REVIEW OF SYSTEMS:  Constitutional: No fevers, chills, sweats, or change in appetite Eyes: No visual  changes, double vision, eye pain Ear, nose and throat: No hearing loss, ear pain, nasal congestion, sore throat Cardiovascular: No chest pain, palpitations Respiratory:  No shortness of breath at rest or with exertion.   No wheezes   He has OSA and is on CPAP GastrointestinaI: No nausea, vomiting, diarrhea, abdominal pain, fecal incontinence Genitourinary:  No dysuria, urinary retention or frequency.  No nocturia. Musculoskeletal:  No neck pain, back pain Integumentary: No rash, pruritus, skin lesions Neurological: as above Psychiatric: No depression at this time.  No anxiety Endocrine: No palpitations, diaphoresis, change in appetite, change in weigh or increased thirst Hematologic/Lymphatic:  No anemia, purpura, petechiae. Allergic/Immunologic: No itchy/runny eyes, nasal congestion, recent allergic reactions, rashes  ALLERGIES: Allergies  Allergen Reactions  . Codeine      nausea  . Penicillins     Swelling, rash  . Sulfa Antibiotics     HOME MEDICATIONS: Outpatient Medications Prior to Visit  Medication Sig Dispense Refill  . allopurinol (ZYLOPRIM) 100 MG tablet Take 100 mg by mouth daily.      Marland Kitchen aspirin 81 MG tablet Take 81 mg by mouth daily.      Marland Kitchen atenolol (TENORMIN) 50 MG tablet     . Cholecalciferol (VITAMIN D3) 1000 UNITS tablet Take 1,000 Units by mouth daily.      . felodipine (PLENDIL) 5 MG 24 hr tablet Take 5 mg by mouth daily.      Marland Kitchen  Fiber CAPS Take 1 capsule by mouth daily.      Marland Kitchen lamoTRIgine (LAMICTAL) 150 MG tablet TAKE TWO TABLETS BY MOUTH TWICE A DAY 120 tablet 5  . levofloxacin (LEVAQUIN) 500 MG tablet Take 1 tablet (500 mg total) by mouth daily. 7 tablet 0  . minoxidil (LONITEN) 10 MG tablet Take 40 mg by mouth daily.     . Multiple Vitamin (MULTIVITAMIN) tablet Take 1 tablet by mouth daily.      . prednisoLONE acetate (PRED FORTE) 1 % ophthalmic suspension Place 1 drop into the left eye at bedtime.     . simvastatin (ZOCOR) 20 MG tablet Take 20 mg by mouth  daily.    . traMADol (ULTRAM) 50 MG tablet Take one or two at bedtime 60 tablet 3  . zolpidem (AMBIEN) 10 MG tablet Take 10 mg by mouth at bedtime as needed.      Marland Kitchen guaiFENesin (MUCINEX) 600 MG 12 hr tablet Take 600 mg by mouth 2 (two) times daily as needed.     No facility-administered medications prior to visit.     PAST MEDICAL HISTORY: Past Medical History:  Diagnosis Date  . Cancer ([place])   . Elevated diaphragm   . Gout   . History of prostate cancer Feb 1993  . Hyperlipidemia   . Hypertension   . Hypoxemia   . Normocytic anemia   . OSA (obstructive sleep apnea)   . Peripheral neuropathy ([place])   . Pneumonia   . Vision abnormalities     PAST SURGICAL HISTORY: Past Surgical History:  Procedure Laterality Date  . APPENDECTOMY    . PROSTATECTOMY    . SHOULDER SURGERY     bone spur removed from right    FAMILY HISTORY: Family History  Problem Relation Age of Onset  . Emphysema Father   . Emphysema Brother   . Asthma Daughter   . Congestive Heart Failure Mother     SOCIAL HISTORY:  Social History   Social History  . Marital status: Married    Spouse name: N/A  . Number of children: 2  . Years of education: N/A   Occupational History  . Retired     Company secretary   Social History Main Topics  . Smoking status: Never Smoker  . Smokeless tobacco: Never Used  . Alcohol use Yes     Comment: occasionally  . Drug use: No  . Sexual activity: Not on file   Other Topics Concern  . Not on file   Social History Narrative  . No narrative on file     PHYSICAL EXAM  Vitals:   03/20/16 1302  BP: 126/72  Pulse: 70  Resp: 14  Weight: 194 lb 8 oz (88.2 kg)  Height: 5\' 11"  (1.803 m)    Body mass index is 27.13 kg/m.   General: The patient is well-developed and well-nourished and in no acute distress   Neurologic Exam  Mental status: The patient is alert and oriented x 3 at the time of the examination. The patient has apparent normal recent and remote  memory, with an apparently normal attention span and concentration ability.   Speech is normal.  Cranial nerves: Extraocular movements are full. The left pupil is larger than the right and has reduced reactivity.  Facial symmetry is present. .Facial strength is normal.  Trapezius and sternocleidomastoid strength is normal. No dysarthria is noted.  Hearing is symmetric.   Motor:  Muscle bulk is reduecd in the calves.   Muscle tone  is normal. Strength is  5 / 5 in all 4 extremities.     Sensory: Sensory testing is intact to pinprick, soft touch, vibration sensation, and position sense in the arms.   Reduced vibration sensation in the toes and at the ankles compared to the knees. He has a gradient of temperature sensation in the shin.   Coordination: Cerebellar testing reveals good finger-nose-finger  Gait and station: Station is stable with the eyes open but unstable with his eyes closed.  His gait is good.   He is  unable to do a tandem walk when he looks up or with his eyes closed but is able to do tandem walk if he looks at his feet.  Reflexes: Deep tendon reflexes are increased with spread at the knees    DIAGNOSTIC DATA (LABS, IMAGING, TESTING) - I reviewed patient records, labs, notes, testing and imaging myself where available.  Lab Results  Component Value Date   WBC 6.0 01/24/2015   HGB 12.7 (L) 01/24/2015   HCT 38.9 (L) 01/24/2015   MCV 88.4 01/24/2015   PLT 196 01/24/2015      Component Value Date/Time   NA 140 01/24/2015 1540   K 4.1 01/24/2015 1540   CL 103 01/24/2015 1540   CO2 27 01/24/2015 1540   GLUCOSE 133 (H) 01/24/2015 1540   BUN 16 01/24/2015 1540   CREATININE 0.95 01/24/2015 1540   CALCIUM 9.3 01/24/2015 1540   PROT 7.6 01/24/2015 1540   ALBUMIN 4.0 01/24/2015 1540   AST 18 01/24/2015 1540   ALT 12 (L) 01/24/2015 1540   ALKPHOS 98 01/24/2015 1540   BILITOT 0.5 01/24/2015 1540   GFRNONAA >60 01/24/2015 1540   GFRAA >60 01/24/2015 1540       ASSESSMENT AND PLAN  Polyneuropathy (H[facility])  Essential tremor  OSA (obstructive sleep apnea)  Near syncope   1.   Continue Lamictal for dysesthetic nerve pain 2.   Continue atenolol for tremor, consider primidone if it worsens and affects functioning. 3.   RTC 12 months, sooner if problems  Wilmary Levit A. Felecia Shelling, MD, PhD [id number], [id number] PM Certified in Neurology, Clinical Neurophysiology, Sleep Medicine, Pain Medicine and Neuroimaging  [facility] [address], [place], [place] [address] [phone]

## 2016-03-28 ENCOUNTER — Telehealth: Payer: Self-pay | Admitting: Internal Medicine

## 2016-03-28 MED ORDER — AZITHROMYCIN 250 MG PO TABS: ORAL_TABLET | ORAL | 0 refills | Status: DC

## 2016-03-28 NOTE — Telephone Encounter (Signed)
Spoke with pt, aware of recs.  zpak sent to preferred pharmacy.  Nothing further needed.  

## 2016-03-28 NOTE — Telephone Encounter (Signed)
If z pak worked last time ok to use it again  If not >  Doxy 100 mg bid x 10 days

## 2016-03-28 NOTE — Telephone Encounter (Signed)
Spoke with pt, c/o sore throat, dysphagia, sometimes prod cough with yellow mucus, chest tightness X4 days.  Denies fever, chest pain, pnd.   Has been taking mucinex and using incentive spirometer.    Pt uses [facility] on [address].    MW please advise on recs.  Thanks!   Patient Instructions   zpak should turn the mucus lighter, if not call us   For cough > mucinex dm up to 1200 mg every 12 hours and can supplement with tramadol 50 mg up to every 4 hours as needed   Please remember to go to the x-ray department downstairs for your tests - we will call you with the results when they are available.   Follow up is as needed

## 2016-04-03 ENCOUNTER — Telehealth: Payer: Self-pay | Admitting: Internal Medicine

## 2016-04-03 MED ORDER — DOXYCYCLINE HYCLATE 100 MG PO TABS: 100.0000 mg | ORAL_TABLET | Freq: Two times a day (BID) | ORAL | 0 refills | Status: DC

## 2016-04-03 NOTE — Telephone Encounter (Signed)
Called and spoke to pt. Informed her of the recs per MW. Rx sent to preferred pharmacy. Pt verbalized understanding and denied any further questions or concerns at this time.   

## 2016-04-03 NOTE — Telephone Encounter (Signed)
If z pak worked last time ok to use it again  If not >  Doxy 100 mg bid x 10 days---from last phone note on 8/23--  zpak give on 8/23 and pt stated that this did not help much, he stated that he is still coughing up dark yellow sputum, no fever, denies any body aches, chills or sweats.  Pt is requesting further recs from MW.  Please advise. Thanks  Allergies  Allergen Reactions  . Codeine      nausea  . Penicillins     Swelling, rash  . Sulfa Antibiotics

## 2016-04-03 NOTE — Telephone Encounter (Signed)
Doxy 100 mg bid x 10 days then ov if not better rather than more phone meds

## 2016-04-06 ENCOUNTER — Other Ambulatory Visit: Payer: Self-pay

## 2016-04-18 ENCOUNTER — Encounter: Payer: Self-pay | Admitting: Internal Medicine

## 2016-04-18 ENCOUNTER — Ambulatory Visit (INDEPENDENT_AMBULATORY_CARE_PROVIDER_SITE_OTHER): Payer: Medicare Other | Admitting: Internal Medicine

## 2016-04-18 VITALS — BP 140/72 | HR 58 | Ht 71.0 in | Wt 195.0 lb

## 2016-04-18 DIAGNOSIS — J479 Bronchiectasis, uncomplicated: Secondary | ICD-10-CM

## 2016-04-18 MED ORDER — LEVOFLOXACIN 500 MG PO TABS: 500.0000 mg | ORAL_TABLET | Freq: Every day | ORAL | 11 refills | Status: DC

## 2016-04-18 NOTE — Patient Instructions (Addendum)
Whenever you start the cough > mucinex dm up to 1200 mg every 12 hours and  use the flutter valve as much as you can  Try prilosec otc 20mg   Take 30-60 min before first meal of the day and Pepcid ac (famotidine) 20 mg one @  bedtime until cough is completely gone for at least a week without the need for cough suppression  For nasty mucus > levaquin 500 mg daily x 7 days    Please schedule a follow up visit in 3 months but call sooner if needed with pfts here on return

## 2016-04-18 NOTE — Progress Notes (Signed)
Subjective:     Patient ID: [name], male   DOB: Jan 11, 1946    MRN: [id number]    Brief patient profile:  31 yowm never smoker recurrent R flank pain x around 2005 made worse by lying down on R side with worst ever episode around early Feb 2016 > eval including cxr showing ? Nodules and CT c/w bronchiectasis so referred to pulmonary clinic 10/08/14  by Dr Deland Pretty to sort out.   Note previously seen by our service in 2012:  DATE OF ADMISSION: 02/06/2011 DATE OF DISCHARGE: 02/12/2011  FINAL DIAGNOSES: 1. Healthcare-associated pneumonia with acute hypoxemia. 2. Obstructive sleep apnea, on CPAP. 3. History of prostate cancer. 4. Chronic kidney disease, stage III. 5. Chronically elevated diaphragm. 6. Normocytic anemia, probably anemia of critical illness  hemodilution. Will need followup outpatient.   10/08/2014 Pulmonary consultation/[name]   Chief Complaint  Patient presents with  . Advice Only    pulm nodules; no SOB, CP or cough  cp always present x 10 years whenever lies down on R side or pushes on R lat cw but is only mild and not produced by deep breath or cough  >>add mucinex dm As needed    11/08/2014 NP Acute OV  Pt complains of cough, tickle in throat and minimal congestion that is clear for last 3 days.  No significant nasal congestion or drainage .  No fever, discolored mucus, chest pain, hemoptysis, calf pain, or n/v/d.  Appetite is good.  No otc used for symptoms.  Leaving for [place] in couple of weeks with family , does not want to get sick.  No recent travel or abx use.  No family members sick.  >zpack   11/30/2014 NP Acute OV  Patient returns with no significant improvement in symptoms He complains of persistent cough that has been worse for the last 3-4 months. He complains of a rattling congested cough with thick mucus. Patient was given some Hydromet cough syrup which has helped slightly with his cough.Marland Kitchen He finished a Z-Pak. He has not seen  any significant improvement. Chest x-ray done last week with no signs of pneumonia. He denies any hemoptysis, orthopnea, PND or leg swelling Patient complains that he's had wheezing over the last 3 or 4 days. rec Levaquin 500mg  daily for 7 days  Prednisone taper over next week  Mucinex DM Twice daily  As needed  Cough/congestion    12/15/2014 f/u ov/[name] re: bronchiectasis  Chief Complaint  Patient presents with  . Follow-up    Pt states he feels better in general since last visit, but still coughing- sputum is now clear. He has not had any more wheezing.   Cough is more day than night and now more dry than wet but no purulent or bloody mucus rec Bronchiectasis =   you have scarring of your bronchial tubes   Whenever you develop cough congestion take mucinex or mucinex dm   Pantoprazole (protonix) 40 mg   Take 30-60 min before first meal of the day and Pepcid 20 mg one bedtime until return to office -  GERD  Diet  Stop tenormin  Bisoprolol 5 mg twice dialy  Please schedule a follow up office visit in 6 weeks, call sooner if needed with pfts with Dr Elsworth Soho > never done   12/27/2015 acute extended ov/[name] re: mild bronchiectasis flare/ back on tenormin 50  Chief Complaint  Patient presents with  . Acute Visit    Pt c/o cough x 1 wk, started  with sore throat. Cough is prod in the am with dark yellow sputum. He states if he turns over in his sleep his cough wakes up up.    did start mucinex but not acid suppression  And has not received abx thus far   rec zpak should turn the mucus lighter, if not call us For cough > mucinex dm up to 1200 mg every 12 hours and can supplement with tramadol 50 mg up to every 4 hours as needed  03/24/16 flare of cough/ purulent sputum > rx zpak then doxy no better   04/18/2016 acute extended ov/[name] re: bronchiectasis / does not yet have fltter valve  Chief Complaint  Patient presents with  . Acute Visit    Pt c/o cough with yellow sputum since 03/24/16-  he has already taken zithromax and levaquin with not much relief.    did not take levaquin, did not take ppi but remained on  pepcid each am   Not limited by breathing from desired activities    No obvious day to day or daytime variabilty or assoc chronic  cp or chest tightness, subjective wheeze overt sinus or hb symptoms. No unusual exp hx or h/o childhood pna/ asthma or knowledge of premature birth.  Sleeping ok without nocturnal  or early am exacerbation  of respiratory  c/o's or need for noct saba. Also denies any obvious fluctuation of symptoms with weather or environmental changes or other aggravating or alleviating factors except as outlined above   Current Medications, Allergies, Complete Past Medical History, Past Surgical History, Family History, and Social History were reviewed in [employer] record.  ROS  The following are not active complaints unless bolded sore throat, dysphagia, dental problems, itching, sneezing,  nasal congestion or excess/ purulent secretions, ear ache,   fever, chills, sweats, unintended wt loss, pleuritic or exertional cp, hemoptysis,  orthopnea pnd or leg swelling, presyncope, palpitations, heartburn, abdominal pain, anorexia, nausea, vomiting, diarrhea  or change in bowel or urinary habits, change in stools or urine, dysuria,hematuria,  rash, arthralgias, visual complaints, headache, numbness weakness or ataxia or problems with walking or coordination,  change in mood/affect or memory.            Objective:   Physical Exam   amb wm nad  04/18/2016       195  12/27/2015       189  12/15/14 189 lb 3.2 oz (85.821 kg)  12/14/14 180 lb 6.4 oz (81.829 kg)  11/30/14 198 lb 9.6 oz (90.084 kg)    Vital signs reviewed  - note sats 98% RA on arrival     HEENT: nl dentition, turbinates, and orophanx. Nl external ear canals without cough reflex   NECK :  without JVD/Nodes/TM/ nl carotid upstrokes bilaterally   LUNGS: no acc muscle  use, CTA w/ no wheezing or rales    CV:  RRR  no s3 or murmur or increase in P2, no edema   ABD:  soft and nontender with nl excursion in the supine position. No bruits or organomegaly, bowel sounds nl  MS:  warm without deformities, calf tenderness, cyanosis or clubbing  SKIN: warm and dry without lesions    NEURO:  alert, approp, no deficits       CXR PA and Lateral:   12/27/2015 :    I personally reviewed images and agree with radiology impression as follows:    Low lung volumes with bibasilar atelectasis and/or scarring again noted. Mild lingular infiltrate  cannot be excluded.     Assessment:

## 2016-04-22 ENCOUNTER — Encounter: Payer: Self-pay | Admitting: Internal Medicine

## 2016-04-22 NOTE — Assessment & Plan Note (Signed)
See CT chest 09/30/14 - added flutter valve 04/18/2016   Not really clear how much of a bronchiectasis flare this is but may benefit from more aggressive action plan as follows  1) max rx for gerd whenver cough starts  2) max mucinex dm/ flutter  3) levaquin x 500 x 7 days as zpak no longer turning mucus clear   4) return for full pfts to see if bronchodilators might be helpful or if tenormin needs to be changed to more selective BB  I had an extended discussion with the patient reviewing all relevant studies completed to date and  lasting 15 to 20 minutes of a 25 minute visit    Each maintenance medication was reviewed in detail including most importantly the difference between maintenance and prns and under what circumstances the prns are to be triggered using an action plan format that is not reflected in the computer generated alphabetically organized AVS.    Please see instructions for details which were reviewed in writing and the patient given a copy highlighting the part that I personally wrote and discussed at today's ov.

## 2016-05-07 DIAGNOSIS — Z125 Encounter for screening for malignant neoplasm of prostate: Secondary | ICD-10-CM | POA: Diagnosis not present

## 2016-05-07 DIAGNOSIS — Z7982 Long term (current) use of aspirin: Secondary | ICD-10-CM | POA: Diagnosis not present

## 2016-05-07 DIAGNOSIS — E7800 Pure hypercholesterolemia, unspecified: Secondary | ICD-10-CM | POA: Diagnosis not present

## 2016-05-07 DIAGNOSIS — I10 Essential (primary) hypertension: Secondary | ICD-10-CM | POA: Diagnosis not present

## 2016-05-07 DIAGNOSIS — Z6828 Body mass index (BMI) 28.0-28.9, adult: Secondary | ICD-10-CM | POA: Diagnosis not present

## 2016-05-07 DIAGNOSIS — Z0000 Encounter for general adult medical examination without abnormal findings: Secondary | ICD-10-CM | POA: Diagnosis not present

## 2016-05-09 DIAGNOSIS — M109 Gout, unspecified: Secondary | ICD-10-CM | POA: Diagnosis not present

## 2016-05-09 DIAGNOSIS — K219 Gastro-esophageal reflux disease without esophagitis: Secondary | ICD-10-CM | POA: Diagnosis not present

## 2016-05-09 DIAGNOSIS — G629 Polyneuropathy, unspecified: Secondary | ICD-10-CM | POA: Diagnosis not present

## 2016-05-09 DIAGNOSIS — G4733 Obstructive sleep apnea (adult) (pediatric): Secondary | ICD-10-CM | POA: Diagnosis not present

## 2016-05-09 DIAGNOSIS — Z23 Encounter for immunization: Secondary | ICD-10-CM | POA: Diagnosis not present

## 2016-06-15 ENCOUNTER — Ambulatory Visit (INDEPENDENT_AMBULATORY_CARE_PROVIDER_SITE_OTHER): Payer: Medicare Other | Admitting: Internal Medicine

## 2016-06-15 ENCOUNTER — Encounter: Payer: Self-pay | Admitting: Internal Medicine

## 2016-06-15 VITALS — BP 130/78 | HR 58 | Temp 98.0°F | Ht 71.0 in | Wt 191.4 lb

## 2016-06-15 DIAGNOSIS — J479 Bronchiectasis, uncomplicated: Secondary | ICD-10-CM

## 2016-06-15 NOTE — Progress Notes (Signed)
Subjective:     Patient ID: [name], male   DOB: 08/26/1945    MRN: [id number]    Brief patient profile:  4 yowm never smoker recurrent R flank pain x around 2005 made worse by lying down on R side with worst ever episode around early Feb 2016 > eval including cxr showing ? Nodules and CT c/w bronchiectasis so referred to pulmonary clinic 10/08/14  by Dr Deland Pretty to sort out.   Note previously seen by our service in 2012:  DATE OF ADMISSION: 02/06/2011 DATE OF DISCHARGE: 02/12/2011  FINAL DIAGNOSES: 1. Healthcare-associated pneumonia with acute hypoxemia. 2. Obstructive sleep apnea, on CPAP. 3. History of prostate cancer. 4. Chronic kidney disease, stage III. 5. Chronically elevated diaphragm. 6. Normocytic anemia, probably anemia of critical illness  hemodilution. Will need followup outpatient.   10/08/2014 Pulmonary consultation/[name]   Chief Complaint  Patient presents with  . Advice Only    pulm nodules; no SOB, CP or cough  cp always present x 10 years whenever lies down on R side or pushes on R lat cw but is only mild and not produced by deep breath or cough  >>add mucinex dm As needed    11/08/2014 NP Acute OV  Pt complains of cough, tickle in throat and minimal congestion that is clear for last 3 days.  No significant nasal congestion or drainage .  No fever, discolored mucus, chest pain, hemoptysis, calf pain, or n/v/d.  Appetite is good.  No otc used for symptoms.  Leaving for [place] in couple of weeks with family , does not want to get sick.  No recent travel or abx use.  No family members sick.  >zpack   11/30/2014 NP Acute OV  Patient returns with no significant improvement in symptoms He complains of persistent cough that has been worse for the last 3-4 months. He complains of a rattling congested cough with thick mucus. Patient was given some Hydromet cough syrup which has helped slightly with his cough.Marland Kitchen He finished a Z-Pak. He has not seen  any significant improvement. Chest x-ray done last week with no signs of pneumonia. He denies any hemoptysis, orthopnea, PND or leg swelling Patient complains that he's had wheezing over the last 3 or 4 days. rec Levaquin 500mg  daily for 7 days  Prednisone taper over next week  Mucinex DM Twice daily  As needed  Cough/congestion    12/15/2014 f/u ov/[name] re: bronchiectasis  Chief Complaint  Patient presents with  . Follow-up    Pt states he feels better in general since last visit, but still coughing- sputum is now clear. He has not had any more wheezing.   Cough is more day than night and now more dry than wet but no purulent or bloody mucus rec Bronchiectasis =   you have scarring of your bronchial tubes   Whenever you develop cough congestion take mucinex or mucinex dm   Pantoprazole (protonix) 40 mg   Take 30-60 min before first meal of the day and Pepcid 20 mg one bedtime until return to office -  GERD  Diet  Stop tenormin  Bisoprolol 5 mg twice dialy  Please schedule a follow up office visit in 6 weeks, call sooner if needed with pfts with Dr Elsworth Soho > never done   12/27/2015 acute extended ov/[name] re: mild bronchiectasis flare/ back on tenormin 50  Chief Complaint  Patient presents with  . Acute Visit    Pt c/o cough x 1 wk, started  with sore throat. Cough is prod in the am with dark yellow sputum. He states if he turns over in his sleep his cough wakes up up.    did start mucinex but not acid suppression  And has not received abx thus far   rec zpak should turn the mucus lighter, if not call us For cough > mucinex dm up to 1200 mg every 12 hours and can supplement with tramadol 50 mg up to every 4 hours as needed  03/24/16 flare of cough/ purulent sputum > rx zpak then doxy no better   04/18/2016 acute extended ov/[name] re: bronchiectasis / does not yet have fultter valve  Chief Complaint  Patient presents with  . Acute Visit    Pt c/o cough with yellow sputum since 03/24/16-  he has already taken zithromax and levaquin with not much relief.   did not take levaquin, did not take ppi but remained on  pepcid each am  rec Whenever you start the cough > mucinex dm up to 1200 mg every 12 hours and  use the flutter valve as much as you can Try prilosec otc 20mg   Take 30-60 min before first meal of the day and Pepcid ac (famotidine) 20 mg one @  bedtime until cough is completely gone for at least a week without the need for cough suppression For nasty mucus > levaquin 500 mg daily x 7 days    06/15/2016  f/u ov/[name] re: bronchiectasis  Chief Complaint  Patient presents with  . Acute Visit    Pt c/o cough with large amounts of dark yellow to green sputum x 2 wks.    Not limited by breathing from desired activities    No obvious day to day or daytime variabilty or assoc chronic  cp or chest tightness, subjective wheeze overt sinus or hb symptoms. No unusual exp hx or h/o childhood pna/ asthma or knowledge of premature birth.  Sleeping ok without nocturnal  or early am exacerbation  of respiratory  c/o's or need for noct saba. Also denies any obvious fluctuation of symptoms with weather or environmental changes or other aggravating or alleviating factors except as outlined above   Current Medications, Allergies, Complete Past Medical History, Past Surgical History, Family History, and Social History were reviewed in [employer] record.  ROS  The following are not active complaints unless bolded sore throat, dysphagia, dental problems, itching, sneezing,  nasal congestion or excess/ purulent secretions, ear ache,   fever, chills, sweats, unintended wt loss, pleuritic or exertional cp, hemoptysis,  orthopnea pnd or leg swelling, presyncope, palpitations, heartburn, abdominal pain, anorexia, nausea, vomiting, diarrhea  or change in bowel or urinary habits, change in stools or urine, dysuria,hematuria,  rash, arthralgias, visual complaints, headache,  numbness weakness or ataxia or problems with walking or coordination,  change in mood/affect or memory.            Objective:   Physical Exam   amb wm nad  06/15/2016      191  04/18/2016       195  12/27/2015       189  12/15/14 189 lb 3.2 oz (85.821 kg)  12/14/14 180 lb 6.4 oz (81.829 kg)  11/30/14 198 lb 9.6 oz (90.084 kg)    Vital signs reviewed  - note sats 98% RA on arrival     HEENT: nl dentition, turbinates, and orophanx. Nl external ear canals without cough reflex   NECK :  without JVD/Nodes/TM/ nl  carotid upstrokes bilaterally   LUNGS: no acc muscle use,  L > R rhonchi exp > insp    CV:  RRR  no s3 or murmur or increase in P2, no edema   ABD:  soft and nontender with nl excursion in the supine position. No bruits or organomegaly, bowel sounds nl  MS:  warm without deformities, calf tenderness, cyanosis or clubbing  SKIN: warm and dry without lesions    NEURO:  alert, approp, no deficits           Assessment:

## 2016-06-15 NOTE — Patient Instructions (Addendum)
Whenever you start the cough > mucinex dm up to 1200 mg every 12 hours and  use the flutter valve as much as you can  Try prilosec otc 20mg   Take 30-60 min before first meal of the day and Pepcid ac (famotidine) 20 mg one @  bedtime until cough is completely gone for at least a week without the need for cough suppression  For nasty mucus > levaquin 500 mg daily x 7 days    When you return for your lung functions we will also do a follow up cxr  Lae add) need pft to complete the w/u and probably should also do alpha one/IgE levels as well  t

## 2016-06-17 ENCOUNTER — Encounter: Payer: Self-pay | Admitting: Internal Medicine

## 2016-06-17 NOTE — Assessment & Plan Note (Addendum)
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    Please see instructions for details which were reviewed in writing and the patient given a copy highlighting the part that I personally wrote and discussed at today's ov.

## 2016-06-18 ENCOUNTER — Telehealth: Payer: Self-pay | Admitting: Internal Medicine

## 2016-06-18 MED ORDER — LEVOFLOXACIN 500 MG PO TABS: 500.0000 mg | ORAL_TABLET | Freq: Every day | ORAL | 0 refills | Status: DC

## 2016-06-18 NOTE — Telephone Encounter (Signed)
Called and spoke to pt. Pt states the Levaquin that was he advised to take by Dr. Melvyn Novas on 06/15/16 was not called into pharmacy. Rx sent to preferred pharmacy. Pt verbalized understanding and denied any further questions or concerns at this time.

## 2016-07-18 ENCOUNTER — Other Ambulatory Visit (INDEPENDENT_AMBULATORY_CARE_PROVIDER_SITE_OTHER): Payer: Medicare Other

## 2016-07-18 ENCOUNTER — Encounter: Payer: Self-pay | Admitting: Internal Medicine

## 2016-07-18 ENCOUNTER — Ambulatory Visit (INDEPENDENT_AMBULATORY_CARE_PROVIDER_SITE_OTHER): Payer: Medicare Other | Admitting: Internal Medicine

## 2016-07-18 VITALS — BP 132/78 | HR 63 | Ht 70.5 in | Wt 186.0 lb

## 2016-07-18 DIAGNOSIS — J479 Bronchiectasis, uncomplicated: Secondary | ICD-10-CM

## 2016-07-18 LAB — CBC WITH DIFFERENTIAL/PLATELET
BASOS ABS: 0 10*3/uL (ref 0.0–0.1)
Basophils Relative: 0.6 % (ref 0.0–3.0)
EOS ABS: 0.4 10*3/uL (ref 0.0–0.7)
Eosinophils Relative: 5.1 % — ABNORMAL HIGH (ref 0.0–5.0)
HCT: 45.4 % (ref 39.0–52.0)
Hemoglobin: 15.4 g/dL (ref 13.0–17.0)
LYMPHS ABS: 1.6 10*3/uL (ref 0.7–4.0)
Lymphocytes Relative: 22.2 % (ref 12.0–46.0)
MCHC: 33.8 g/dL (ref 30.0–36.0)
MCV: 91 fl (ref 78.0–100.0)
MONO ABS: 0.4 10*3/uL (ref 0.1–1.0)
Monocytes Relative: 5.4 % (ref 3.0–12.0)
NEUTROS ABS: 4.9 10*3/uL (ref 1.4–7.7)
NEUTROS PCT: 66.7 % (ref 43.0–77.0)
PLATELETS: 183 10*3/uL (ref 150.0–400.0)
RBC: 4.99 Mil/uL (ref 4.22–5.81)
RDW: 14.9 % (ref 11.5–15.5)
WBC: 7.4 10*3/uL (ref 4.0–10.5)

## 2016-07-18 LAB — PULMONARY FUNCTION TEST
DL/VA % pred: 104 %
DL/VA: 4.83 ml/min/mmHg/L
DLCO UNC: 23.55 ml/min/mmHg
DLCO cor % pred: 73 %
DLCO cor: 24.12 ml/min/mmHg
DLCO unc % pred: 71 %
FEF 25-75 Post: 2.3 L/sec
FEF 25-75 Pre: 2.02 L/sec
FEF2575-%Change-Post: 14 %
FEF2575-%PRED-POST: 95 %
FEF2575-%Pred-Pre: 83 %
FEV1-%CHANGE-POST: 2 %
FEV1-%PRED-POST: 73 %
FEV1-%Pred-Pre: 71 %
FEV1-PRE: 2.31 L
FEV1-Post: 2.37 L
FEV1FVC-%Change-Post: 3 %
FEV1FVC-%Pred-Pre: 108 %
FEV6-%Change-Post: -1 %
FEV6-%PRED-POST: 69 %
FEV6-%Pred-Pre: 70 %
FEV6-POST: 2.89 L
FEV6-PRE: 2.93 L
FEV6FVC-%CHANGE-POST: 0 %
FEV6FVC-%PRED-POST: 106 %
FEV6FVC-%PRED-PRE: 106 %
FVC-%CHANGE-POST: -1 %
FVC-%PRED-POST: 65 %
FVC-%Pred-Pre: 66 %
FVC-POST: 2.89 L
FVC-Pre: 2.93 L
PRE FEV6/FVC RATIO: 100 %
Post FEV1/FVC ratio: 82 %
Post FEV6/FVC ratio: 100 %
Pre FEV1/FVC ratio: 79 %
RV % PRED: 103 %
RV: 2.62 L
TLC % PRED: 79 %
TLC: 5.69 L

## 2016-07-18 MED ORDER — LEVOFLOXACIN 750 MG PO TABS: 750.0000 mg | ORAL_TABLET | Freq: Every day | ORAL | 11 refills | Status: DC

## 2016-07-18 NOTE — Patient Instructions (Addendum)
Please see patient coordinator before you leave today  to schedule sinus CT  Please remember to go to the lab  department downstairs for your tests - we will call you with the results when they are available.     When mucus gets really nasty > levaquin 750 mg daily x 7 days   Please schedule a follow up visit in 3 months but call sooner if needed

## 2016-07-18 NOTE — Progress Notes (Signed)
Subjective:     Patient ID: [name], male   DOB: 1946/04/22    MRN: [id number]    Brief patient profile:  16 yowm never smoker recurrent R flank pain x around 2005 made worse by lying down on R side with worst ever episode around early Feb 2016 > eval including cxr showing ? Nodules and CT 09/30/14 c/w bronchiectasis so referred to pulmonary clinic 10/08/14  by Dr Deland Pretty to sort out.   Note previously seen by our service in 2012:  DATE OF ADMISSION: 02/06/2011 DATE OF DISCHARGE: 02/12/2011  FINAL DIAGNOSES: 1. Healthcare-associated pneumonia with acute hypoxemia. 2. Obstructive sleep apnea, on CPAP. 3. History of prostate cancer. 4. Chronic kidney disease, stage III. 5. Chronically elevated diaphragm. 6. Normocytic anemia, probably anemia of critical illness  hemodilution. Will need followup outpatient.   10/08/2014 Pulmonary consultation/[name]   Chief Complaint  Patient presents with  . Advice Only    pulm nodules; no SOB, CP or cough  cp always present x 10 years whenever lies down on R side or pushes on R lat cw but is only mild and not produced by deep breath or cough  >>add mucinex dm As needed    11/08/2014 NP Acute OV  Pt complains of cough, tickle in throat and minimal congestion that is clear for last 3 days.  No significant nasal congestion or drainage .  No fever, discolored mucus, chest pain, hemoptysis, calf pain, or n/v/d.  Appetite is good.  No otc used for symptoms.  Leaving for [place] in couple of weeks with family , does not want to get sick.  No recent travel or abx use.  No family members sick.  >zpack   11/30/2014 NP Acute OV  Patient returns with no significant improvement in symptoms He complains of persistent cough that has been worse for the last 3-4 months. He complains of a rattling congested cough with thick mucus. Patient was given some Hydromet cough syrup which has helped slightly with his cough.Marland Kitchen He finished a Z-Pak. He has  not seen any significant improvement. Chest x-ray done last week with no signs of pneumonia. He denies any hemoptysis, orthopnea, PND or leg swelling Patient complains that he's had wheezing over the last 3 or 4 days. rec Levaquin 500mg  daily for 7 days  Prednisone taper over next week  Mucinex DM Twice daily  As needed  Cough/congestion    12/15/2014 f/u ov/[facility] re: bronchiectasis  Chief Complaint  Patient presents with  . Follow-up    Pt states he feels better in general since last visit, but still coughing- sputum is now clear. He has not had any more wheezing.   Cough is more day than night and now more dry than wet but no purulent or bloody mucus rec Bronchiectasis =   you have scarring of your bronchial tubes   Whenever you develop cough congestion take mucinex or mucinex dm   Pantoprazole (protonix) 40 mg   Take 30-60 min before first meal of the day and Pepcid 20 mg one bedtime until return to office -  GERD  Diet  Stop tenormin  Bisoprolol 5 mg twice dialy  Please schedule a follow up office visit in 6 weeks, call sooner if needed with pfts with Dr Elsworth Soho > never done   12/27/2015 acute extended ov/[name] re: mild bronchiectasis flare/ back on tenormin 50  Chief Complaint  Patient presents with  . Acute Visit    Pt c/o cough x 1 wk,  started with sore throat. Cough is prod in the am with dark yellow sputum. He states if he turns over in his sleep his cough wakes up up.    did start mucinex but not acid suppression  And has not received abx thus far   rec zpak should turn the mucus lighter, if not call us For cough > mucinex dm up to 1200 mg every 12 hours and can supplement with tramadol 50 mg up to every 4 hours as needed  03/24/16 flare of cough/ purulent sputum > rx zpak then doxy no better   04/18/2016 acute extended ov/[name] re: bronchiectasis / does not yet have fultter valve  Chief Complaint  Patient presents with  . Acute Visit    Pt c/o cough with yellow sputum since  03/24/16- he has already taken zithromax and levaquin with not much relief.   did not take levaquin, did not take ppi but remained on  pepcid each am  rec Whenever you start the cough > mucinex dm up to 1200 mg every 12 hours and  use the flutter valve as much as you can Try prilosec otc 20mg   Take 30-60 min before first meal of the day and Pepcid ac (famotidine) 20 mg one @  bedtime until cough is completely gone for at least a week without the need for cough suppression For nasty mucus > levaquin 500 mg daily x 7 days    06/15/2016  f/u ov/[name] re: bronchiectasis  Chief Complaint  Patient presents with  . Acute Visit    Pt c/o cough with large amounts of dark yellow to green sputum x 2 wks.   Not limited by breathing from desired activities   rec Whenever you start the cough > mucinex dm up to 1200 mg every 12 hours and  use the flutter valve as much as you can Try prilosec otc 20mg   Take 30-60 min before first meal of the day and Pepcid ac (famotidine) 20 mg one @  bedtime until cough is completely gone for at least a week without the need for cough suppression For nasty mucus > levaquin 500 mg daily x 7 days     07/18/2016  f/u ov/[name] re: bronchiectasis with nl pfts Chief Complaint  Patient presents with  . Follow-up    PFT's done today. He is coughing much less than before, but sputum is still very dark in color. His breathing is unchanged. He c/o nasal congestion and ears feeling stuffy for the past few days.    still hears whistling sound if lies down/ cough first thing in am < 5 min able to bring up dark mucus x months no change p levaquin  Not limited by breathing from desired activities       No obvious day to day or daytime variabilty or assoc mucus plugs/ hemoptysis,  cp or chest tightness, subjective wheeze overt sinus or hb symptoms. No unusual exp hx or h/o childhood pna/ asthma or knowledge of premature birth.  Sleeping ok without nocturnal  or early am exacerbation   of respiratory  c/o's or need for noct saba. Also denies any obvious fluctuation of symptoms with weather or environmental changes or other aggravating or alleviating factors except as outlined above   Current Medications, Allergies, Complete Past Medical History, Past Surgical History, Family History, and Social History were reviewed in [employer] record.  ROS  The following are not active complaints unless bolded sore throat, dysphagia, dental problems, itching, sneezing,  nasal congestion or excess/ purulent secretions, ear ache,   fever, chills, sweats, unintended wt loss, pleuritic or exertional cp, hemoptysis,  orthopnea pnd or leg swelling, presyncope, palpitations, heartburn, abdominal pain, anorexia, nausea, vomiting, diarrhea  or change in bowel or urinary habits, change in stools or urine, dysuria,hematuria,  rash, arthralgias, visual complaints, headache, numbness weakness or ataxia or problems with walking or coordination,  change in mood/affect or memory.            Objective:   Physical Exam   amb wm nad  07/18/2016      186  06/15/2016      191  04/18/2016       195  12/27/2015       189  12/15/14 189 lb 3.2 oz (85.821 kg)  12/14/14 180 lb 6.4 oz (81.829 kg)  11/30/14 198 lb 9.6 oz (90.084 kg)    Vital signs reviewed  - note sats 94% RA on arrival     HEENT: nl dentition,   and orophanx. Nl external ear canals without cough reflex - moderate bilateral non-specific turbinate edema  R>>L    NECK :  without JVD/Nodes/TM/ nl carotid upstrokes bilaterally   LUNGS: no acc muscle use,  No wheeze or rhonchi or cough on insp/ exp    CV:  RRR  no s3 or murmur or increase in P2, no edema   ABD:  soft and nontender with nl excursion in the supine position. No bruits or organomegaly, bowel sounds nl  MS:  warm without deformities, calf tenderness, cyanosis or clubbing  SKIN: warm and dry without lesions    NEURO:  alert, approp, no deficits      Labs ordered 07/18/2016  Allergy profile/ alpha one screening       Assessment:

## 2016-07-19 ENCOUNTER — Ambulatory Visit (HOSPITAL_BASED_OUTPATIENT_CLINIC_OR_DEPARTMENT_OTHER)
Admission: RE | Admit: 2016-07-19 | Discharge: 2016-07-19 | Disposition: A | Discharge status: Home / Self Care | Payer: Medicare Other | Source: Ambulatory Visit | Attending: Internal Medicine | Admitting: Internal Medicine

## 2016-07-19 ENCOUNTER — Encounter: Payer: Self-pay | Admitting: Internal Medicine

## 2016-07-19 DIAGNOSIS — J320 Chronic maxillary sinusitis: Secondary | ICD-10-CM | POA: Diagnosis not present

## 2016-07-19 DIAGNOSIS — J479 Bronchiectasis, uncomplicated: Secondary | ICD-10-CM

## 2016-07-19 DIAGNOSIS — Z9889 Other specified postprocedural states: Secondary | ICD-10-CM | POA: Insufficient documentation

## 2016-07-19 DIAGNOSIS — J329 Chronic sinusitis, unspecified: Secondary | ICD-10-CM | POA: Diagnosis not present

## 2016-07-19 LAB — RESPIRATORY ALLERGY PROFILE REGION II ~~LOC~~
Allergen, C. Herbarum, M2: <0.1 kU/L
Allergen, Cedar tree, t12: <0.1 kU/L
Allergen, Cottonwood, t14: <0.1 kU/L
Allergen, Mouse Urine Protein, e78: <0.1 kU/L
Allergen, P. notatum, m1: <0.1 kU/L
Aspergillus fumigatus, m3: <0.1 kU/L
Bermuda Grass: <0.1 kU/L
Box Elder IgE: <0.1 kU/L
Cat Dander: <0.1 kU/L
Common Ragweed: <0.1 kU/L
Dog Dander: <0.1 kU/L
Elm IgE: <0.1 kU/L
IgE (Immunoglobulin E), Serum: 6 kU/L (ref <115)
Pecan/Hickory Tree IgE: <0.1 kU/L
Timothy Grass: <0.1 kU/L

## 2016-07-19 NOTE — Assessment & Plan Note (Addendum)
See CT chest 09/30/14 - added flutter valve 04/18/2016  - added cycles of levaquin 500 x 7 d 06/15/2016 > no change mucus so 07/18/2016 changed to 750 x 7  - PFT's  07/18/2016  wnl  p nothing prior to study with DLCO  71/73 % corrects to 104 % for alv volume - Allergy profile 07/18/2016 >  Eos 0.4 /  IgE   - sinus CT 07/18/2016 >>>    - alpha one AT screening 07/18/2016   I had an extended discussion with the patient reviewing all relevant studies completed to date and  lasting 15 to 20 minutes of a 25 minute visit on the following ongoing concerns:   Reviewed again the pathophysiology of bronchiectasis and explained assoc with sinus dz which may be why he doesn't clear his sputum on levaquin 500 and needs to try the 750 on next flare/ continue the mucinex and flutter use prn  Also needs alpha one screening, check IgE to be complete but no need for bronchodilators here and no need to change the tenormin based on absence of significant airflow obst on today's pfts/ reviewed  Each maintenance medication was reviewed in detail including most importantly the difference between maintenance and as needed and under what circumstances the prns are to be used.  Please see AVS for  customized  Instructions which were reviewed in detail in writing with the patient and a copy provided.

## 2016-07-19 NOTE — Progress Notes (Signed)
Spoke with pt and notified of results per Dr. [name]. Pt verbalized understanding and denied any questions. 

## 2016-07-20 LAB — ALPHA-1-ANTITRYPSIN: A-1 Antitrypsin, Ser: 172 mg/dL (ref 83–199)

## 2016-07-20 NOTE — Progress Notes (Signed)
Spoke with pt and notified of results per Dr. [name]. Pt verbalized understanding and denied any questions. 

## 2016-07-23 LAB — ALPHA-1 ANTITRYPSIN PHENOTYPE: A1 ANTITRYPSIN: 168 mg/dL (ref 83–199)

## 2016-07-24 ENCOUNTER — Telehealth: Payer: Self-pay | Admitting: Internal Medicine

## 2016-07-24 NOTE — Telephone Encounter (Signed)
Glenn Rockers, [name] sent to [name], [name]        Call patient : Studies are unremarkable, no change in recs    Spoke with pt and notified of results per Dr. Melvyn Novas. Pt verbalized understanding and denied any questions.

## 2016-07-24 NOTE — Progress Notes (Signed)
LMTCB

## 2016-07-24 NOTE — Progress Notes (Signed)
Spoke with pt and notified of results per Dr. [name]. Pt verbalized understanding and denied any questions. 

## 2016-08-08 DIAGNOSIS — R0981 Nasal congestion: Secondary | ICD-10-CM | POA: Diagnosis not present

## 2016-09-19 DIAGNOSIS — J0100 Acute maxillary sinusitis, unspecified: Secondary | ICD-10-CM | POA: Diagnosis not present

## 2016-10-08 DIAGNOSIS — J329 Chronic sinusitis, unspecified: Secondary | ICD-10-CM | POA: Diagnosis not present

## 2016-10-17 ENCOUNTER — Ambulatory Visit: Payer: Medicare Other | Admitting: Internal Medicine

## 2016-10-22 DIAGNOSIS — M79644 Pain in right finger(s): Secondary | ICD-10-CM | POA: Diagnosis not present

## 2016-10-22 DIAGNOSIS — S62354A Nondisplaced fracture of shaft of fourth metacarpal bone, right hand, initial encounter for closed fracture: Secondary | ICD-10-CM | POA: Diagnosis not present

## 2016-10-22 DIAGNOSIS — S62629A Displaced fracture of medial phalanx of unspecified finger, initial encounter for closed fracture: Secondary | ICD-10-CM | POA: Diagnosis not present

## 2016-11-23 DIAGNOSIS — S62354D Nondisplaced fracture of shaft of fourth metacarpal bone, right hand, subsequent encounter for fracture with routine healing: Secondary | ICD-10-CM | POA: Diagnosis not present

## 2016-11-29 DIAGNOSIS — M25641 Stiffness of right hand, not elsewhere classified: Secondary | ICD-10-CM | POA: Diagnosis not present

## 2016-11-29 DIAGNOSIS — S62354D Nondisplaced fracture of shaft of fourth metacarpal bone, right hand, subsequent encounter for fracture with routine healing: Secondary | ICD-10-CM | POA: Diagnosis not present

## 2016-12-05 DIAGNOSIS — S62354D Nondisplaced fracture of shaft of fourth metacarpal bone, right hand, subsequent encounter for fracture with routine healing: Secondary | ICD-10-CM | POA: Diagnosis not present

## 2016-12-05 DIAGNOSIS — M25641 Stiffness of right hand, not elsewhere classified: Secondary | ICD-10-CM | POA: Diagnosis not present

## 2016-12-12 DIAGNOSIS — S62354D Nondisplaced fracture of shaft of fourth metacarpal bone, right hand, subsequent encounter for fracture with routine healing: Secondary | ICD-10-CM | POA: Diagnosis not present

## 2016-12-12 DIAGNOSIS — M25641 Stiffness of right hand, not elsewhere classified: Secondary | ICD-10-CM | POA: Diagnosis not present

## 2016-12-19 DIAGNOSIS — S62354D Nondisplaced fracture of shaft of fourth metacarpal bone, right hand, subsequent encounter for fracture with routine healing: Secondary | ICD-10-CM | POA: Diagnosis not present

## 2016-12-19 DIAGNOSIS — M25641 Stiffness of right hand, not elsewhere classified: Secondary | ICD-10-CM | POA: Diagnosis not present

## 2016-12-24 DIAGNOSIS — S6291XD Unspecified fracture of right wrist and hand, subsequent encounter for fracture with routine healing: Secondary | ICD-10-CM | POA: Diagnosis not present

## 2016-12-26 ENCOUNTER — Telehealth: Payer: Self-pay | Admitting: Pulmonary Disease

## 2016-12-26 DIAGNOSIS — G4733 Obstructive sleep apnea (adult) (pediatric): Secondary | ICD-10-CM

## 2016-12-26 DIAGNOSIS — M25641 Stiffness of right hand, not elsewhere classified: Secondary | ICD-10-CM | POA: Diagnosis not present

## 2016-12-26 DIAGNOSIS — S62354D Nondisplaced fracture of shaft of fourth metacarpal bone, right hand, subsequent encounter for fracture with routine healing: Secondary | ICD-10-CM | POA: Diagnosis not present

## 2016-12-26 NOTE — Telephone Encounter (Signed)
CPAP 12 cm prescription can be given

## 2016-12-26 NOTE — Telephone Encounter (Signed)
Pt states that his CPAP machine broke last night.  Pt states that he is wanting to purchase one from [email or web address] Pt needs Rx for replacement CPAP machine.  Last OV 2015, has Consult scheduled with RA on 01/07/17.  Please advise if okay to write Rx.Thanks.   Please advise Dr Elsworth Soho. Thanks.

## 2016-12-27 NOTE — Telephone Encounter (Signed)
lmtcb x1 for pt. Rx has been placed in brown folder up front for pickup.

## 2016-12-28 NOTE — Telephone Encounter (Signed)
lmomtcb x 2 for the pt.  

## 2017-01-01 NOTE — Telephone Encounter (Signed)
Attempted to call the pt but the phone line was jumbled and could not hear anything on the line.  Will try to call back.

## 2017-01-02 DIAGNOSIS — S62354D Nondisplaced fracture of shaft of fourth metacarpal bone, right hand, subsequent encounter for fracture with routine healing: Secondary | ICD-10-CM | POA: Diagnosis not present

## 2017-01-02 NOTE — Telephone Encounter (Signed)
Spoke with the pt and he is aware and rx for CPAP already picked up Nothing further needed

## 2017-01-07 ENCOUNTER — Ambulatory Visit (INDEPENDENT_AMBULATORY_CARE_PROVIDER_SITE_OTHER): Payer: Medicare Other | Admitting: Pulmonary Disease

## 2017-01-07 ENCOUNTER — Encounter: Payer: Self-pay | Admitting: Pulmonary Disease

## 2017-01-07 DIAGNOSIS — J479 Bronchiectasis, uncomplicated: Secondary | ICD-10-CM | POA: Diagnosis not present

## 2017-01-07 DIAGNOSIS — G4733 Obstructive sleep apnea (adult) (pediatric): Secondary | ICD-10-CM

## 2017-01-07 NOTE — Patient Instructions (Signed)
Bring in the card so that we can check download

## 2017-01-07 NOTE — Assessment & Plan Note (Signed)
Stable Use Mucinex on an as-needed basis for that his clearance

## 2017-01-07 NOTE — Assessment & Plan Note (Signed)
We'll obtain download on your CPAP machine on 12 cm and ensure that settings are okay  compliance with goal of at least 4-6 hrs every night is the expectation. Advised against medications with sedative side effects Cautioned against driving when sleepy - understanding that sleepiness will vary on a day to day basis

## 2017-01-07 NOTE — Progress Notes (Signed)
   Subjective:    Patient ID: [name], male    DOB: Aug 10, 1945, 71 y.o.   MRN: [id number]  HPI  70/M for FU of obstructive sleep apnea And bronchiectasis .  He was hospitalized in July 2012 for severe community-acquired pneumonia and required oxygen which was subsequently discontinued in 2013  He is followed by my partner Dr. Melvyn Novas for bronchiectasis and mild mediastinal lymphadenopathy  His CPAP broke and 12/2016 and we sent in prescription for new machine at 12 cm which he obtained from [email or web address]. He prefers getting his supplies online rather than from DME. He likes new nasal pillows that he is obtained, no problems mask or pressure, denies nasal dryness. He lost about 45 pounds from his original weight of 240 to his current weight of 190 pounds-but still feels like he needs the CPAP machine  He occasionally has sputum production which is controlled with Mucinex. Last antibiotic usage was in December 2017   Significant tests/ events  PSG 02/2002 - mild OSA with AHI 16/h, lowest satn of 70% >> CPAP 9  10/30/2011 SPlit study showed O2 still required -esp during REM sleep  Increased CpAP to 12 cm , Keep 3 L O2 blended in   07/2016 PFTs nml     Review of Systems Patient denies significant dyspnea,cough, hemoptysis,  chest pain, palpitations, pedal edema, orthopnea, paroxysmal nocturnal dyspnea, lightheadedness, nausea, vomiting, abdominal or  leg pains      Objective:   Physical Exam   Gen. Pleasant, well-nourished, in no distress ENT - no thrush, no post nasal drip Neck: No JVD, no thyromegaly, no carotid bruits Lungs: no use of accessory muscles, no dullness to percussion, left basal rales no rhonchi  Cardiovascular: Rhythm regular, heart sounds  normal, no murmurs or gallops, no peripheral edema Musculoskeletal: No deformities, no cyanosis or clubbing         Assessment & Plan:

## 2017-01-09 DIAGNOSIS — S62354D Nondisplaced fracture of shaft of fourth metacarpal bone, right hand, subsequent encounter for fracture with routine healing: Secondary | ICD-10-CM | POA: Diagnosis not present

## 2017-01-25 DIAGNOSIS — H18603 Keratoconus, unspecified, bilateral: Secondary | ICD-10-CM | POA: Diagnosis not present

## 2017-01-25 DIAGNOSIS — H2511 Age-related nuclear cataract, right eye: Secondary | ICD-10-CM | POA: Diagnosis not present

## 2017-01-25 DIAGNOSIS — H01009 Unspecified blepharitis unspecified eye, unspecified eyelid: Secondary | ICD-10-CM | POA: Diagnosis not present

## 2017-01-25 DIAGNOSIS — H04123 Dry eye syndrome of bilateral lacrimal glands: Secondary | ICD-10-CM | POA: Diagnosis not present

## 2017-03-18 ENCOUNTER — Encounter: Payer: Self-pay | Admitting: Pulmonary Disease

## 2017-03-19 ENCOUNTER — Telehealth: Payer: Self-pay | Admitting: Pulmonary Disease

## 2017-03-19 DIAGNOSIS — L570 Actinic keratosis: Secondary | ICD-10-CM | POA: Diagnosis not present

## 2017-03-19 DIAGNOSIS — B351 Tinea unguium: Secondary | ICD-10-CM | POA: Diagnosis not present

## 2017-03-19 DIAGNOSIS — L308 Other specified dermatitis: Secondary | ICD-10-CM | POA: Diagnosis not present

## 2017-03-19 DIAGNOSIS — D225 Melanocytic nevi of trunk: Secondary | ICD-10-CM | POA: Diagnosis not present

## 2017-03-19 DIAGNOSIS — Z1283 Encounter for screening for malignant neoplasm of skin: Secondary | ICD-10-CM | POA: Diagnosis not present

## 2017-03-19 NOTE — Telephone Encounter (Signed)
Received SD card for CPAP compliance download. Will place the download in RA's look-at folder.

## 2017-03-21 NOTE — Telephone Encounter (Signed)
CPAP 12 cm working well

## 2017-03-22 NOTE — Telephone Encounter (Signed)
lmtcb x1 for pt. 

## 2017-03-25 NOTE — Telephone Encounter (Signed)
lmtcb x2 for pt. 

## 2017-03-26 NOTE — Telephone Encounter (Signed)
lmtcb x3 for pt. 

## 2017-03-27 ENCOUNTER — Ambulatory Visit (INDEPENDENT_AMBULATORY_CARE_PROVIDER_SITE_OTHER): Payer: Medicare Other | Admitting: Neurology

## 2017-03-27 ENCOUNTER — Encounter: Payer: Self-pay | Admitting: Neurology

## 2017-03-27 VITALS — BP 169/70 | HR 62 | Resp 18 | Ht 70.5 in | Wt 194.5 lb

## 2017-03-27 DIAGNOSIS — G250 Essential tremor: Secondary | ICD-10-CM | POA: Diagnosis not present

## 2017-03-27 DIAGNOSIS — G629 Polyneuropathy, unspecified: Secondary | ICD-10-CM | POA: Diagnosis not present

## 2017-03-27 DIAGNOSIS — R208 Other disturbances of skin sensation: Secondary | ICD-10-CM | POA: Diagnosis not present

## 2017-03-27 DIAGNOSIS — G4733 Obstructive sleep apnea (adult) (pediatric): Secondary | ICD-10-CM

## 2017-03-27 MED ORDER — LAMOTRIGINE 150 MG PO TABS: 300.0000 mg | ORAL_TABLET | Freq: Two times a day (BID) | ORAL | 11 refills | Status: DC

## 2017-03-27 NOTE — Progress Notes (Signed)
\    [facility]  PATIENT: [name] DOB: November 21, 1945  REFERRING CLINICIAN: Deland Pretty HISTORY FROM: Patient REASON FOR VISIT: Polyneuropathy and tremors   HISTORICAL  CHIEF COMPLAINT:  Chief Complaint  Patient presents with  . Polyneuropathy    Sts. neuropathy has been "pretty stable" with Lamictal.  Tremors about the same with Atenolol/fim  . Tremors    HISTORY OF PRESENT ILLNESS:  [name] is a 71 year old man with polyneuropathy/dysesthesias, tremors and pre-syncopal spells.  POLYNEUROPATHY:  He has numbness/dysesthesias and reduced balance.  Numbness is from mid shin to the foot.   Extent is unchanged.    Lamotrigine has helped the pain but not hte numbness,  His dose is lamotrigine 300 mg by mouth twice a day and he tolerates it well.   Neurontin and a tricyclic antidepressant did not help the pain. Blood work including B12, cryoglobulins and electrophoresis had been essentially normal in the past, last tested in 2013. Nerve conduction study in the past was mildly abnormal but more recent one showed good functioning of the large fibers.  TREMOR:  He has an essential tremor in both hands, worse on his left.   It is worse after a shower.The tremor is worse when he writes, types or tries to put in his contacts and when upset or anxious. Atenolol helps some.       OSA/INSOMNIA:   He has OSA and uses CPAP nightly.   He tolerates it well and feels more refreshed during the day.   He is less likely to doze off on CPAP that he was before he started using it.    He denies excessive daytime sleepiness.    Insomnia does well mst nights and he takes Ambien if his legs are bothering him more with benefit.     PRESYNCOPE SPELLS:   He denies any more events of lightheadedness/dizziness.   The event monitor was normal.    He denies any TIA type symptoms.      MRA shows mild intracranial stenosis.  He is on simvastatin and ASA    REVIEW OF SYSTEMS:    Constitutional: No fevers, chills, sweats, or change in appetite Eyes: No visual changes, double vision, eye pain Ear, nose and throat: No hearing loss, ear pain, nasal congestion, sore throat Cardiovascular: No chest pain, palpitations Respiratory:  No shortness of breath at rest or with exertion.   No wheezes   He has OSA and is on CPAP GastrointestinaI: No nausea, vomiting, diarrhea, abdominal pain, fecal incontinence Genitourinary:  No dysuria, urinary retention or frequency.  No nocturia. Musculoskeletal:  No neck pain, back pain Integumentary: No rash, pruritus, skin lesions Neurological: as above Psychiatric: No depression at this time.  No anxiety Endocrine: No palpitations, diaphoresis, change in appetite, change in weigh or increased thirst Hematologic/Lymphatic:  No anemia, purpura, petechiae. Allergic/Immunologic: No itchy/runny eyes, nasal congestion, recent allergic reactions, rashes  ALLERGIES: Allergies  Allergen Reactions  . Codeine      nausea  . Penicillins     Swelling, rash  . Sulfa Antibiotics     HOME MEDICATIONS: Outpatient Medications Prior to Visit  Medication Sig Dispense Refill  . allopurinol (ZYLOPRIM) 100 MG tablet Take 100 mg by mouth daily.      Marland Kitchen aspirin 81 MG tablet Take 81 mg by mouth daily.      Marland Kitchen atenolol (TENORMIN) 50 MG tablet Take 50 mg by mouth 2 (two) times daily.     . Cholecalciferol (VITAMIN D3)  1000 UNITS tablet Take 1,000 Units by mouth daily.      . famotidine (PEPCID) 20 MG tablet Take 20 mg by mouth at bedtime.    . felodipine (PLENDIL) 5 MG 24 hr tablet Take 5 mg by mouth daily.      . Fiber CAPS Take 1 capsule by mouth daily.      Marland Kitchen guaiFENesin (MUCINEX) 600 MG 12 hr tablet Take 600 mg by mouth 2 (two) times daily.    . minoxidil (LONITEN) 10 MG tablet Take 40 mg by mouth daily.     . Multiple Vitamin (MULTIVITAMIN) tablet Take 1 tablet by mouth daily.      Marland Kitchen omeprazole (PRILOSEC) 20 MG capsule Take 20 mg by mouth daily.     . prednisoLONE acetate (PRED FORTE) 1 % ophthalmic suspension Place 1 drop into the left eye at bedtime.     Marland Kitchen Respiratory Therapy Supplies (FLUTTER) DEVI as directed.    . simvastatin (ZOCOR) 20 MG tablet Take 20 mg by mouth daily.    Marland Kitchen zolpidem (AMBIEN) 10 MG tablet Take 10 mg by mouth at bedtime as needed.      . lamoTRIgine (LAMICTAL) 150 MG tablet Take 2 tablets (300 mg total) by mouth 2 (two) times daily. 120 tablet 11   No facility-administered medications prior to visit.     PAST MEDICAL HISTORY: Past Medical History:  Diagnosis Date  . Cancer ([place])   . Elevated diaphragm   . Gout   . History of prostate cancer Feb 1993  . Hyperlipidemia   . Hypertension   . Hypoxemia   . Normocytic anemia   . OSA (obstructive sleep apnea)   . Peripheral neuropathy   . Pneumonia   . Vision abnormalities     PAST SURGICAL HISTORY: Past Surgical History:  Procedure Laterality Date  . APPENDECTOMY    . PROSTATECTOMY    . SHOULDER SURGERY     bone spur removed from right    FAMILY HISTORY: Family History  Problem Relation Age of Onset  . Emphysema Father   . Emphysema Brother   . Asthma Daughter   . Congestive Heart Failure Mother     SOCIAL HISTORY:  Social History   Social History  . Marital status: Married    Spouse name: N/A  . Number of children: 2  . Years of education: N/A   Occupational History  . Retired     Company secretary   Social History Main Topics  . Smoking status: Never Smoker  . Smokeless tobacco: Never Used  . Alcohol use Yes     Comment: occasionally  . Drug use: No  . Sexual activity: Not on file   Other Topics Concern  . Not on file   Social History Narrative  . No narrative on file     PHYSICAL EXAM  Vitals:   03/27/17 1259  BP: (!) 169/70  Pulse: 62  Resp: 18  Weight: 194 lb 8 oz (88.2 kg)  Height: 5' 10.5" (1.791 m)    Body mass index is 27.51 kg/m.   General: The patient is well-developed and well-nourished and in no  acute distress.    Redness around =preseptal left eye   Neurologic Exam  Mental status: The patient is alert and oriented x 3 at the time of the examination. The patient has apparent normal recent and remote memory, with an apparently normal attention span and concentration ability.   Speech is normal.  Cranial nerves: Extraocular movements are full.  The left pupil is larger than the right and has reduced reactivity.  Facial symmetry is present. .Facial strength is normal.  Trapezius and sternocleidomastoid strength is normal. No dysarthria is noted.  Hearing is symmetric.   Motor:  Muscle bulk is reduecd in the calves.   Muscle tone is normal. Strength is  5 / 5 in all 4 extremities.     Sensory: Sensation is intact to temperature, touch and vibration in the arms. He has reduced vibration sensation at the ankles and almost absent sensation at the toes.. He has a gradient of temperature sensation in the shin.   Coordination: Cerebellar testing reveals good finger-nose-finger  Gait and station: Station is stable with the eyes open. He has a positive Romberg sign with the eyes closed. His gait is fairly normal but he needs to look down. Tandem gait is wide even when he looks down.    Reflexes: Deep tendon reflexes are increased with spread at the knees    DIAGNOSTIC DATA (LABS, IMAGING, TESTING) - I reviewed patient records, labs, notes, testing and imaging myself where available.  Lab Results  Component Value Date   WBC 7.4 07/18/2016   HGB 15.4 07/18/2016   HCT 45.4 07/18/2016   MCV 91.0 07/18/2016   PLT 183.0 07/18/2016      Component Value Date/Time   NA 140 01/24/2015 1540   K 4.1 01/24/2015 1540   CL 103 01/24/2015 1540   CO2 27 01/24/2015 1540   GLUCOSE 133 (H) 01/24/2015 1540   BUN 16 01/24/2015 1540   CREATININE 0.95 01/24/2015 1540   CALCIUM 9.3 01/24/2015 1540   PROT 7.6 01/24/2015 1540   ALBUMIN 4.0 01/24/2015 1540   AST 18 01/24/2015 1540   ALT 12 (L)  01/24/2015 1540   ALKPHOS 98 01/24/2015 1540   BILITOT 0.5 01/24/2015 1540   GFRNONAA >60 01/24/2015 1540   GFRAA >60 01/24/2015 1540      ASSESSMENT AND PLAN  Polyneuropathy  Dysesthesia  Essential tremor  OSA (obstructive sleep apnea)   1.   He will continue lamotrigine 300 mg twice a day for the dysesthesias and a new prescription was sent in. He should call us if there is any worsening.  2.   Continue atenolol for tremor.   We discussed considering primidone if the tremor worsens.. 3.   RTC 12 months, sooner if problems  Richard A. Felecia Shelling, MD, PhD 11/12/1446, 1:85 PM Certified in Neurology, Clinical Neurophysiology, Sleep Medicine, Pain Medicine and Neuroimaging  [facility] [address], [place], [place] [address] [phone]

## 2017-06-10 DIAGNOSIS — E7800 Pure hypercholesterolemia, unspecified: Secondary | ICD-10-CM | POA: Diagnosis not present

## 2017-06-10 DIAGNOSIS — Z0000 Encounter for general adult medical examination without abnormal findings: Secondary | ICD-10-CM | POA: Diagnosis not present

## 2017-06-10 DIAGNOSIS — Z7982 Long term (current) use of aspirin: Secondary | ICD-10-CM | POA: Diagnosis not present

## 2017-06-10 DIAGNOSIS — Z125 Encounter for screening for malignant neoplasm of prostate: Secondary | ICD-10-CM | POA: Diagnosis not present

## 2017-06-10 DIAGNOSIS — I10 Essential (primary) hypertension: Secondary | ICD-10-CM | POA: Diagnosis not present

## 2017-06-13 DIAGNOSIS — Z7982 Long term (current) use of aspirin: Secondary | ICD-10-CM | POA: Diagnosis not present

## 2017-06-13 DIAGNOSIS — E876 Hypokalemia: Secondary | ICD-10-CM | POA: Diagnosis not present

## 2017-06-13 DIAGNOSIS — N62 Hypertrophy of breast: Secondary | ICD-10-CM | POA: Diagnosis not present

## 2017-06-13 DIAGNOSIS — K219 Gastro-esophageal reflux disease without esophagitis: Secondary | ICD-10-CM | POA: Diagnosis not present

## 2017-06-13 DIAGNOSIS — J387 Other diseases of larynx: Secondary | ICD-10-CM | POA: Diagnosis not present

## 2017-06-13 DIAGNOSIS — D696 Thrombocytopenia, unspecified: Secondary | ICD-10-CM | POA: Diagnosis not present

## 2017-06-13 DIAGNOSIS — G4733 Obstructive sleep apnea (adult) (pediatric): Secondary | ICD-10-CM | POA: Diagnosis not present

## 2017-06-13 DIAGNOSIS — R918 Other nonspecific abnormal finding of lung field: Secondary | ICD-10-CM | POA: Diagnosis not present

## 2017-06-13 DIAGNOSIS — E7800 Pure hypercholesterolemia, unspecified: Secondary | ICD-10-CM | POA: Diagnosis not present

## 2017-06-13 DIAGNOSIS — Z1212 Encounter for screening for malignant neoplasm of rectum: Secondary | ICD-10-CM | POA: Diagnosis not present

## 2017-06-13 DIAGNOSIS — I10 Essential (primary) hypertension: Secondary | ICD-10-CM | POA: Diagnosis not present

## 2017-06-13 DIAGNOSIS — M109 Gout, unspecified: Secondary | ICD-10-CM | POA: Diagnosis not present

## 2017-06-13 DIAGNOSIS — J309 Allergic rhinitis, unspecified: Secondary | ICD-10-CM | POA: Diagnosis not present

## 2017-06-13 DIAGNOSIS — B351 Tinea unguium: Secondary | ICD-10-CM | POA: Diagnosis not present

## 2017-08-02 DIAGNOSIS — H52209 Unspecified astigmatism, unspecified eye: Secondary | ICD-10-CM | POA: Diagnosis not present

## 2017-08-02 DIAGNOSIS — H02836 Dermatochalasis of left eye, unspecified eyelid: Secondary | ICD-10-CM | POA: Diagnosis not present

## 2017-08-02 DIAGNOSIS — H02833 Dermatochalasis of right eye, unspecified eyelid: Secondary | ICD-10-CM | POA: Diagnosis not present

## 2017-08-02 DIAGNOSIS — Z947 Corneal transplant status: Secondary | ICD-10-CM | POA: Diagnosis not present

## 2017-08-02 DIAGNOSIS — H0100A Unspecified blepharitis right eye, upper and lower eyelids: Secondary | ICD-10-CM | POA: Diagnosis not present

## 2017-08-02 DIAGNOSIS — H4389 Other disorders of vitreous body: Secondary | ICD-10-CM | POA: Diagnosis not present

## 2017-08-02 DIAGNOSIS — H18822 Corneal disorder due to contact lens, left eye: Secondary | ICD-10-CM | POA: Diagnosis not present

## 2017-08-02 DIAGNOSIS — H348312 Tributary (branch) retinal vein occlusion, right eye, stable: Secondary | ICD-10-CM | POA: Diagnosis not present

## 2017-08-02 DIAGNOSIS — Z961 Presence of intraocular lens: Secondary | ICD-10-CM | POA: Diagnosis not present

## 2017-08-02 DIAGNOSIS — H0100B Unspecified blepharitis left eye, upper and lower eyelids: Secondary | ICD-10-CM | POA: Diagnosis not present

## 2017-08-02 DIAGNOSIS — H269 Unspecified cataract: Secondary | ICD-10-CM | POA: Diagnosis not present

## 2017-09-04 DIAGNOSIS — Z85828 Personal history of other malignant neoplasm of skin: Secondary | ICD-10-CM | POA: Diagnosis not present

## 2017-09-04 DIAGNOSIS — Z08 Encounter for follow-up examination after completed treatment for malignant neoplasm: Secondary | ICD-10-CM | POA: Diagnosis not present

## 2017-09-04 DIAGNOSIS — B078 Other viral warts: Secondary | ICD-10-CM | POA: Diagnosis not present

## 2017-09-04 DIAGNOSIS — X32XXXD Exposure to sunlight, subsequent encounter: Secondary | ICD-10-CM | POA: Diagnosis not present

## 2017-09-04 DIAGNOSIS — L570 Actinic keratosis: Secondary | ICD-10-CM | POA: Diagnosis not present

## 2017-10-28 DIAGNOSIS — M47816 Spondylosis without myelopathy or radiculopathy, lumbar region: Secondary | ICD-10-CM | POA: Diagnosis not present

## 2017-10-28 DIAGNOSIS — M5136 Other intervertebral disc degeneration, lumbar region: Secondary | ICD-10-CM | POA: Diagnosis not present

## 2017-11-12 DIAGNOSIS — Z961 Presence of intraocular lens: Secondary | ICD-10-CM | POA: Diagnosis not present

## 2017-11-12 DIAGNOSIS — Z947 Corneal transplant status: Secondary | ICD-10-CM | POA: Diagnosis not present

## 2017-11-12 DIAGNOSIS — S0502XA Injury of conjunctiva and corneal abrasion without foreign body, left eye, initial encounter: Secondary | ICD-10-CM | POA: Diagnosis not present

## 2017-11-12 DIAGNOSIS — H0100A Unspecified blepharitis right eye, upper and lower eyelids: Secondary | ICD-10-CM | POA: Diagnosis not present

## 2017-11-12 DIAGNOSIS — T85612A Breakdown (mechanical) of permanent sutures, initial encounter: Secondary | ICD-10-CM | POA: Diagnosis not present

## 2017-11-12 DIAGNOSIS — H0100B Unspecified blepharitis left eye, upper and lower eyelids: Secondary | ICD-10-CM | POA: Diagnosis not present

## 2017-11-12 DIAGNOSIS — H2511 Age-related nuclear cataract, right eye: Secondary | ICD-10-CM | POA: Diagnosis not present

## 2017-11-19 DIAGNOSIS — Z885 Allergy status to narcotic agent status: Secondary | ICD-10-CM | POA: Diagnosis not present

## 2017-11-19 DIAGNOSIS — Z880 Allergy status to penicillin: Secondary | ICD-10-CM | POA: Diagnosis not present

## 2017-11-19 DIAGNOSIS — H02833 Dermatochalasis of right eye, unspecified eyelid: Secondary | ICD-10-CM | POA: Diagnosis not present

## 2017-11-19 DIAGNOSIS — H01004 Unspecified blepharitis left upper eyelid: Secondary | ICD-10-CM | POA: Diagnosis not present

## 2017-11-19 DIAGNOSIS — H01005 Unspecified blepharitis left lower eyelid: Secondary | ICD-10-CM | POA: Diagnosis not present

## 2017-11-19 DIAGNOSIS — H02836 Dermatochalasis of left eye, unspecified eyelid: Secondary | ICD-10-CM | POA: Diagnosis not present

## 2017-11-19 DIAGNOSIS — H01001 Unspecified blepharitis right upper eyelid: Secondary | ICD-10-CM | POA: Diagnosis not present

## 2017-11-19 DIAGNOSIS — Z9889 Other specified postprocedural states: Secondary | ICD-10-CM | POA: Diagnosis not present

## 2017-11-19 DIAGNOSIS — Z961 Presence of intraocular lens: Secondary | ICD-10-CM | POA: Diagnosis not present

## 2017-11-19 DIAGNOSIS — H348312 Tributary (branch) retinal vein occlusion, right eye, stable: Secondary | ICD-10-CM | POA: Diagnosis not present

## 2017-11-19 DIAGNOSIS — Z882 Allergy status to sulfonamides status: Secondary | ICD-10-CM | POA: Diagnosis not present

## 2017-11-19 DIAGNOSIS — H01002 Unspecified blepharitis right lower eyelid: Secondary | ICD-10-CM | POA: Diagnosis not present

## 2017-11-19 DIAGNOSIS — H2511 Age-related nuclear cataract, right eye: Secondary | ICD-10-CM | POA: Diagnosis not present

## 2017-11-19 DIAGNOSIS — S0502XD Injury of conjunctiva and corneal abrasion without foreign body, left eye, subsequent encounter: Secondary | ICD-10-CM | POA: Diagnosis not present

## 2017-12-05 ENCOUNTER — Telehealth: Payer: Self-pay | Admitting: Pulmonary Disease

## 2017-12-05 MED ORDER — DOXYCYCLINE HYCLATE 100 MG PO TABS: 100.0000 mg | ORAL_TABLET | Freq: Two times a day (BID) | ORAL | 0 refills | Status: DC

## 2017-12-05 NOTE — Telephone Encounter (Signed)
Spoke with patient. He is aware of RA's recs. Will go ahead and call in doxy for him.   Nothing else needed at time of call.

## 2017-12-05 NOTE — Telephone Encounter (Signed)
Called and spoke to patient. Patient reported that a week ago patient started to have a sore throat and drainage but that has resolved. Patient stated he continues to have a cough with light yellow sputum. Patient denies fever and reports that he is taking Mucinex and using the flutter. Patient would like RA's opinion.  RA please advise.

## 2017-12-05 NOTE — Telephone Encounter (Signed)
Doxycycline 100 mg twice daily for 7 days 

## 2017-12-09 ENCOUNTER — Telehealth: Payer: Self-pay | Admitting: Pulmonary Disease

## 2017-12-09 MED ORDER — AZITHROMYCIN 250 MG PO TABS: ORAL_TABLET | ORAL | 0 refills | Status: DC

## 2017-12-09 NOTE — Telephone Encounter (Signed)
Called and spoke to patient and reviewed Dr. Bari Mantis recommendations. Patient requested that Rx be sent to [facility] on [address]. Rx sent to preferred pharmacy. Nothing further is needed at this time.

## 2017-12-09 NOTE — Telephone Encounter (Signed)
Z-Pak

## 2017-12-09 NOTE — Telephone Encounter (Signed)
Called and spoke to pt. Pt states he started taking Doxycycline (see phone note from 5.2.2019) on evening of 5.2.2019. Pt states he began having itching between fingers and tops of feet on 5.4.2019, pt then began having hives on buttocks on 5.5.2019. Pt stopped taking Doxy and took Benadryl 25mg  on 5.5.2019 and hives and itching resolved (Doxycycline placed on pt's allergy list). Pt denied having any increase in SOB, chest tightness or pain, f/c/s, swelling. Pt states he is still having the original s/s from 5.2.2019 (prod cough with yellow mucus, fatigue).   Dr. Elsworth Soho please advise if another abx is needed. Thanks.

## 2017-12-26 DIAGNOSIS — K2970 Gastritis, unspecified, without bleeding: Secondary | ICD-10-CM | POA: Diagnosis not present

## 2018-01-09 ENCOUNTER — Telehealth: Payer: Self-pay | Admitting: Neurology

## 2018-01-09 NOTE — Telephone Encounter (Signed)
Pt called requesting a call back, stating he has been having balance issues and dizzy spells. Would like to discuss with the RN about coming in sooner. Please call to advise

## 2018-01-09 NOTE — Telephone Encounter (Signed)
Spoke with [name]. He reports intermittent dizziness in the am and early afternoon, which resolves by 3-4pm every day.  He has a pending appt. but would like sooner appt.  I will see if I can find a sooner spot and call him back when I do/fim

## 2018-01-10 NOTE — Telephone Encounter (Signed)
Spoke with [name] had a cancellation for 01/14/18 at 1130.  I offered this appt., with a check in time of 11am, and he accepted/fim

## 2018-01-13 DIAGNOSIS — Z947 Corneal transplant status: Secondary | ICD-10-CM | POA: Diagnosis not present

## 2018-01-13 DIAGNOSIS — H18603 Keratoconus, unspecified, bilateral: Secondary | ICD-10-CM | POA: Diagnosis not present

## 2018-01-13 DIAGNOSIS — H18822 Corneal disorder due to contact lens, left eye: Secondary | ICD-10-CM | POA: Diagnosis not present

## 2018-01-13 DIAGNOSIS — H2511 Age-related nuclear cataract, right eye: Secondary | ICD-10-CM | POA: Diagnosis not present

## 2018-01-13 DIAGNOSIS — H04123 Dry eye syndrome of bilateral lacrimal glands: Secondary | ICD-10-CM | POA: Diagnosis not present

## 2018-01-14 ENCOUNTER — Other Ambulatory Visit: Payer: Self-pay

## 2018-01-14 ENCOUNTER — Encounter: Payer: Self-pay | Admitting: Neurology

## 2018-01-14 ENCOUNTER — Ambulatory Visit (INDEPENDENT_AMBULATORY_CARE_PROVIDER_SITE_OTHER): Payer: Medicare Other | Admitting: Neurology

## 2018-01-14 VITALS — BP 158/62 | HR 62

## 2018-01-14 DIAGNOSIS — G629 Polyneuropathy, unspecified: Secondary | ICD-10-CM | POA: Diagnosis not present

## 2018-01-14 DIAGNOSIS — R55 Syncope and collapse: Secondary | ICD-10-CM | POA: Diagnosis not present

## 2018-01-14 DIAGNOSIS — G4733 Obstructive sleep apnea (adult) (pediatric): Secondary | ICD-10-CM | POA: Diagnosis not present

## 2018-01-14 DIAGNOSIS — G250 Essential tremor: Secondary | ICD-10-CM

## 2018-01-14 DIAGNOSIS — R208 Other disturbances of skin sensation: Secondary | ICD-10-CM | POA: Diagnosis not present

## 2018-01-14 MED ORDER — PRIMIDONE 50 MG PO TABS: ORAL_TABLET | ORAL | 5 refills | Status: DC

## 2018-01-14 NOTE — Progress Notes (Signed)
\    [facility]  PATIENT: [name] DOB: 1945/11/10  REFERRING CLINICIAN: Deland Pretty HISTORY FROM: Patient REASON FOR VISIT: Polyneuropathy and tremors   HISTORICAL  CHIEF COMPLAINT:  Chief Complaint  Patient presents with  . Dizziness    Sts. was having increased intermittent episodesof dizziness/lightheadedness last wk, but this has been resolved since Friday./fim    HISTORY OF PRESENT ILLNESS:  [name] is a 72 year old man with polyneuropathy/dysesthesias, tremors and pre-syncopal spells.  Update 01/14/2018: He had a week or so of dizziness that was lightheaded.    He felt balance was off and he stumbled but no falls.    The first episode was at night while reading in bed.    He denies any palpitations.   No visual changes or headaches.   The episodes last 4-5 hours   BP was checked with some of the spells but it was slightly elevated but not high, highest was 156/83 but most readings 120's/60's.   He has been on atenolol x many years.    He is feeling better again and no spells last 5 days.      The polyneuropathy tingling/pain is better on lamotrigine.   He tolerates it well.    Balance is off in the dark.     He feels the tremor is slightly wore but not bad enough to add a second medication.   Writing is worse.   He has difficulty putting in his contacts.     He has OSA and uses CPAP nightly.   He tolerates it well.     Insomnia is now rare.     From 03/27/2017:  POLYNEUROPATHY:  He has numbness/dysesthesias and reduced balance.  Numbness is from mid shin to the foot.   Extent is unchanged.    Lamotrigine has helped the pain but not hte numbness,  His dose is lamotrigine 300 mg by mouth twice a day and he tolerates it well.   Neurontin and a tricyclic antidepressant did not help the pain. Blood work including B12, cryoglobulins and electrophoresis had been essentially normal in the past, last tested in 2013. Nerve conduction study in the past  was mildly abnormal but more recent one showed good functioning of the large fibers.  TREMOR:  He has an essential tremor in both hands, worse on his left.   It is worse after a shower.The tremor is worse when he writes, types or tries to put in his contacts and when upset or anxious. Atenolol helps some.       OSA/INSOMNIA:   He has OSA and uses CPAP nightly.   He tolerates it well and feels more refreshed during the day.   He is less likely to doze off on CPAP that he was before he started using it.    He denies excessive daytime sleepiness.    Insomnia does well mst nights and he takes Ambien if his legs are bothering him more with benefit.     PRESYNCOPE SPELLS:   He denies any more events of lightheadedness/dizziness.   The event monitor was normal.    He denies any TIA type symptoms.      MRA shows mild intracranial stenosis.  He is on simvastatin and ASA    REVIEW OF SYSTEMS:  Constitutional: No fevers, chills, sweats, or change in appetite Eyes: No visual changes, double vision, eye pain Ear, nose and throat: No hearing loss, ear pain, nasal congestion, sore throat Cardiovascular: No chest pain,  palpitations Respiratory:  No shortness of breath at rest or with exertion.   No wheezes   He has OSA and is on CPAP GastrointestinaI: No nausea, vomiting, diarrhea, abdominal pain, fecal incontinence Genitourinary:  No dysuria, urinary retention or frequency.  No nocturia. Musculoskeletal:  No neck pain, back pain Integumentary: No rash, pruritus, skin lesions Neurological: as above Psychiatric: No depression at this time.  No anxiety Endocrine: No palpitations, diaphoresis, change in appetite, change in weigh or increased thirst Hematologic/Lymphatic:  No anemia, purpura, petechiae. Allergic/Immunologic: No itchy/runny eyes, nasal congestion, recent allergic reactions, rashes  ALLERGIES: Allergies  Allergen Reactions  . Codeine      nausea  . Doxycycline     Hives and itching  .  Penicillins     Swelling, rash  . Sulfa Antibiotics     HOME MEDICATIONS: Outpatient Medications Prior to Visit  Medication Sig Dispense Refill  . allopurinol (ZYLOPRIM) 100 MG tablet Take 100 mg by mouth daily.      Marland Kitchen aspirin 81 MG tablet Take 81 mg by mouth daily.      Marland Kitchen atenolol (TENORMIN) 50 MG tablet Take 50 mg by mouth 2 (two) times daily.     . Cholecalciferol (VITAMIN D3) 1000 UNITS tablet Take 1,000 Units by mouth daily.      . clobetasol cream (TEMOVATE) 0.05 % Apply topically.    . felodipine (PLENDIL) 5 MG 24 hr tablet Take 5 mg by mouth daily.      . Fiber CAPS Take 1 capsule by mouth daily.      Marland Kitchen lamoTRIgine (LAMICTAL) 150 MG tablet Take 2 tablets (300 mg total) by mouth 2 (two) times daily. 120 tablet 11  . meloxicam (MOBIC) 15 MG tablet     . minoxidil (LONITEN) 10 MG tablet Take 40 mg by mouth daily.     . Multiple Vitamin (MULTIVITAMIN) tablet Take 1 tablet by mouth daily.      . prednisoLONE acetate (PRED FORTE) 1 % ophthalmic suspension Place 1 drop into the left eye at bedtime.     Marland Kitchen Respiratory Therapy Supplies (FLUTTER) DEVI as directed.    . simvastatin (ZOCOR) 20 MG tablet Take 20 mg by mouth daily.    Marland Kitchen zolpidem (AMBIEN) 10 MG tablet Take 10 mg by mouth at bedtime as needed.      Marland Kitchen azithromycin (ZITHROMAX) 250 MG tablet Take as directed 6 each 0  . doxycycline (VIBRA-TABS) 100 MG tablet Take 1 tablet (100 mg total) by mouth 2 (two) times daily. 14 tablet 0  . famotidine (PEPCID) 20 MG tablet Take 20 mg by mouth at bedtime.    Marland Kitchen guaiFENesin (MUCINEX) 600 MG 12 hr tablet Take 600 mg by mouth 2 (two) times daily.    Marland Kitchen omeprazole (PRILOSEC) 20 MG capsule Take 20 mg by mouth daily.     No facility-administered medications prior to visit.     PAST MEDICAL HISTORY: Past Medical History:  Diagnosis Date  . Cancer ([place])   . Elevated diaphragm   . Gout   . History of prostate cancer Feb 1993  . Hyperlipidemia   . Hypertension   . Hypoxemia   . Normocytic  anemia   . OSA (obstructive sleep apnea)   . Peripheral neuropathy   . Pneumonia   . Vision abnormalities     PAST SURGICAL HISTORY: Past Surgical History:  Procedure Laterality Date  . APPENDECTOMY    . PROSTATECTOMY    . SHOULDER SURGERY     bone  spur removed from right    FAMILY HISTORY: Family History  Problem Relation Age of Onset  . Emphysema Father   . Emphysema Brother   . Asthma Daughter   . Congestive Heart Failure Mother     SOCIAL HISTORY:  Social History   Socioeconomic History  . Marital status: Married    Spouse name: Not on file  . Number of children: 2  . Years of education: Not on file  . Highest education level: Not on file  Occupational History  . Occupation: Retired    Comment: Audiological scientist  . Financial resource strain: Not on file  . Food insecurity:    Worry: Not on file    Inability: Not on file  . Transportation needs:    Medical: Not on file    Non-medical: Not on file  Tobacco Use  . Smoking status: Never Smoker  . Smokeless tobacco: Never Used  Substance and Sexual Activity  . Alcohol use: Yes    Comment: occasionally  . Drug use: No  . Sexual activity: Not on file  Lifestyle  . Physical activity:    Days per week: Not on file    Minutes per session: Not on file  . Stress: Not on file  Relationships  . Social connections:    Talks on phone: Not on file    Gets together: Not on file    Attends religious service: Not on file    Active member of club or organization: Not on file    Attends meetings of clubs or organizations: Not on file    Relationship status: Not on file  . Intimate partner violence:    Fear of current or ex partner: Not on file    Emotionally abused: Not on file    Physically abused: Not on file    Forced sexual activity: Not on file  Other Topics Concern  . Not on file  Social History Narrative  . Not on file     PHYSICAL EXAM  Vitals:   01/14/18 1118  BP: (!) 158/62  Pulse: 62      There is no height or weight on file to calculate BMI.   General: The patient is well-developed and well-nourished and in no acute distress.    Redness around =preseptal left eye   Neurologic Exam  Mental status: The patient is alert and oriented x 3 at the time of the examination. The patient has apparent normal recent and remote memory, with an apparently normal attention span and concentration ability.   Speech is normal.  Cranial nerves: Extraocular movements are full.  Facial strength and sensation is normal..  Trapezius and sternocleidomastoid strength is normal. No dysarthria is noted.  Hearing is symmetric.   Motor:  Muscle bulk is reduecd in the calves.   Muscle tone is normal. Strength is  5 / 5 in all 4 extremities.     Sensory: Sensation is intact to temperature, touch and vibration in the arms. He has reduced vibration sensation at the ankles and almost absent sensation at the toes.. He has a gradient of temperature sensation in the shin.   Coordination: He has good finger-nose-finger and heel-to-shin.  Gait and station: Station is stable with the eyes open.  His gait is fairly normal for age.  The tandem gait is mildly wide.  The Romberg is borderline.  Reflexes: Deep tendon reflexes are increased with spread at the knees    DIAGNOSTIC DATA (LABS, IMAGING, TESTING) -  I reviewed patient records, labs, notes, testing and imaging myself where available.  Lab Results  Component Value Date   WBC 7.4 07/18/2016   HGB 15.4 07/18/2016   HCT 45.4 07/18/2016   MCV 91.0 07/18/2016   PLT 183.0 07/18/2016      Component Value Date/Time   NA 140 01/24/2015 1540   K 4.1 01/24/2015 1540   CL 103 01/24/2015 1540   CO2 27 01/24/2015 1540   GLUCOSE 133 (H) 01/24/2015 1540   BUN 16 01/24/2015 1540   CREATININE 0.95 01/24/2015 1540   CALCIUM 9.3 01/24/2015 1540   PROT 7.6 01/24/2015 1540   ALBUMIN 4.0 01/24/2015 1540   AST 18 01/24/2015 1540   ALT 12 (L) 01/24/2015 1540    ALKPHOS 98 01/24/2015 1540   BILITOT 0.5 01/24/2015 1540   GFRNONAA >60 01/24/2015 1540   GFRAA >60 01/24/2015 1540      ASSESSMENT AND PLAN  Near syncope  Polyneuropathy  Essential tremor  OSA (obstructive sleep apnea)  Dysesthesia   1.   I am not sure what the cause of the episodes of lightheadedness.  He is better now and I would not add any treatment.  He is advised to call us if more episodes occur. 2.   Continue atenolol for tremor.   Add primidone 50 mg twice daily and titrate up as needed. 3.   Continue lamotrigine for dysesthesia.   RTC 6 months, sooner if problems  Richard A. Felecia Shelling, MD, PhD 1/60/7371, 06:26 AM Certified in Neurology, Clinical Neurophysiology, Sleep Medicine, Pain Medicine and Neuroimaging  [facility] [address], [place], [place] [address] [phone]

## 2018-02-12 ENCOUNTER — Ambulatory Visit (INDEPENDENT_AMBULATORY_CARE_PROVIDER_SITE_OTHER): Payer: Medicare Other | Admitting: Pulmonary Disease

## 2018-02-12 ENCOUNTER — Encounter: Payer: Self-pay | Admitting: Pulmonary Disease

## 2018-02-12 DIAGNOSIS — J479 Bronchiectasis, uncomplicated: Secondary | ICD-10-CM

## 2018-02-12 DIAGNOSIS — G4733 Obstructive sleep apnea (adult) (pediatric): Secondary | ICD-10-CM

## 2018-02-12 NOTE — Patient Instructions (Signed)
Continue on CPAP Call me for antibiotic if yellow green sputum

## 2018-02-12 NOTE — Assessment & Plan Note (Signed)
Appears stable, he had only one exacerbation in the past year Emphasized airway clearance strategies and will be antibiotic should he develop yellow-green sputum

## 2018-02-12 NOTE — Assessment & Plan Note (Signed)
CPAP 12 cm seems to be working well. Unfortunately we cannot check download because of his machine.  He seems to be compliant by report and he looks at the display every day and this seems to be working well

## 2018-02-12 NOTE — Progress Notes (Signed)
   Subjective:    Patient ID: [name], male    DOB: [birth year], 72 y.o.   MRN: [id number]  HPI  71/M for FU of obstructive sleep apnea And mild bronchiectasis .   He obtained a new machine online in 2018 and this seems to be working well.  He is nasal pillows no problems with mask or pressure.  He had a flareup 12/2017 with bronchitis he was given doxycycline but developed hives and itching.  He was then given Z-Pak and this seemed to resolve his problems      Significant tests/ events  PSG 02/2002 - mild OSA with AHI 16/h, lowest satn of 70% >> CPAP 9  10/30/2011 SPlit study showed O2 still required -esp during REM sleep  Increased CpAP to 12 cm , Keep 3 L O2 blended in   07/2016 PFTs nml  Past Medical History:  Diagnosis Date  . Cancer ([place])   . Elevated diaphragm   . Gout   . History of prostate cancer Feb 1993  . Hyperlipidemia   . Hypertension   . Hypoxemia   . Normocytic anemia   . OSA (obstructive sleep apnea)   . Peripheral neuropathy   . Pneumonia   . Vision abnormalities      Review of Systems Patient denies significant dyspnea,cough, hemoptysis,  chest pain, palpitations, pedal edema, orthopnea, paroxysmal nocturnal dyspnea, lightheadedness, nausea, vomiting, abdominal or  leg pains      Objective:   Physical Exam  Gen. Pleasant, well-nourished, in no distress ENT - no thrush, no post nasal drip Neck: No JVD, no thyromegaly, no carotid bruits Lungs: no use of accessory muscles, no dullness to percussion, bibasal rales no rhonchi  Cardiovascular: Rhythm regular, heart sounds  normal, no murmurs or gallops, no peripheral edema Musculoskeletal: No deformities, no cyanosis or clubbing        Assessment & Plan:

## 2018-02-18 ENCOUNTER — Ambulatory Visit: Payer: PRIVATE HEALTH INSURANCE | Admitting: Pulmonary Disease

## 2018-02-26 DIAGNOSIS — K582 Mixed irritable bowel syndrome: Secondary | ICD-10-CM | POA: Diagnosis not present

## 2018-03-19 ENCOUNTER — Other Ambulatory Visit: Payer: Self-pay | Admitting: Neurology

## 2018-03-24 DIAGNOSIS — K582 Mixed irritable bowel syndrome: Secondary | ICD-10-CM | POA: Diagnosis not present

## 2018-03-25 DIAGNOSIS — L820 Inflamed seborrheic keratosis: Secondary | ICD-10-CM | POA: Diagnosis not present

## 2018-03-25 DIAGNOSIS — B078 Other viral warts: Secondary | ICD-10-CM | POA: Diagnosis not present

## 2018-03-31 ENCOUNTER — Ambulatory Visit: Payer: Medicare Other | Admitting: Neurology

## 2018-04-09 ENCOUNTER — Telehealth: Payer: Self-pay | Admitting: Pulmonary Disease

## 2018-04-09 DIAGNOSIS — G4733 Obstructive sleep apnea (adult) (pediatric): Secondary | ICD-10-CM

## 2018-04-09 NOTE — Telephone Encounter (Signed)
Called patient back at the number provided, received a message that the caller was not available.   Will attempt to call back later.

## 2018-04-09 NOTE — Telephone Encounter (Signed)
Left message for patient to call back  

## 2018-04-09 NOTE — Telephone Encounter (Signed)
Spoke with patient, patient needs Cpap supply order sent to [facility] and last office note. Fax # [phone]. Made patient aware I would get this sent to a provider and we would call him once everything has been faxed.   BM please advise if willing to sign order for cpap supplies. Thanks

## 2018-04-09 NOTE — Telephone Encounter (Signed)
Left message for patient to call back. I have faxed a copy of his last OV as well as placed the order for new cpap supplies for [facility]. Just wanted to make the patient aware.

## 2018-04-09 NOTE — Telephone Encounter (Signed)
Patient returning call- he can be reached at [phone] -pr

## 2018-04-09 NOTE — Telephone Encounter (Signed)
Im okay to sign for this, as Elsworth Soho is out of the office.    Wyn Quaker FNP

## 2018-04-09 NOTE — Telephone Encounter (Signed)
Pt is calling back [phone]

## 2018-04-10 NOTE — Telephone Encounter (Signed)
Pt is aware that order has been sent and nothing more needed at this time.

## 2018-04-10 NOTE — Telephone Encounter (Signed)
Attempted to call pt. I did not receive an answer. I have left a message for pt to return our call.  

## 2018-04-10 NOTE — Telephone Encounter (Signed)
Pt returning call. Contact number [phone]

## 2018-05-20 DIAGNOSIS — Z23 Encounter for immunization: Secondary | ICD-10-CM | POA: Diagnosis not present

## 2018-05-20 DIAGNOSIS — N62 Hypertrophy of breast: Secondary | ICD-10-CM | POA: Diagnosis not present

## 2018-05-21 ENCOUNTER — Other Ambulatory Visit: Payer: Self-pay | Admitting: Internal Medicine

## 2018-05-21 DIAGNOSIS — N62 Hypertrophy of breast: Secondary | ICD-10-CM

## 2018-05-26 ENCOUNTER — Ambulatory Visit
Admission: RE | Admit: 2018-05-26 | Discharge: 2018-05-26 | Disposition: A | Discharge status: Home / Self Care | Payer: Medicare Other | Source: Ambulatory Visit | Attending: Internal Medicine | Admitting: Internal Medicine

## 2018-05-26 ENCOUNTER — Ambulatory Visit: Payer: PRIVATE HEALTH INSURANCE

## 2018-05-26 DIAGNOSIS — R928 Other abnormal and inconclusive findings on diagnostic imaging of breast: Secondary | ICD-10-CM | POA: Diagnosis not present

## 2018-05-26 DIAGNOSIS — N62 Hypertrophy of breast: Secondary | ICD-10-CM

## 2018-05-27 DIAGNOSIS — H18603 Keratoconus, unspecified, bilateral: Secondary | ICD-10-CM | POA: Diagnosis not present

## 2018-05-27 DIAGNOSIS — H2511 Age-related nuclear cataract, right eye: Secondary | ICD-10-CM | POA: Diagnosis not present

## 2018-05-27 DIAGNOSIS — H01001 Unspecified blepharitis right upper eyelid: Secondary | ICD-10-CM | POA: Diagnosis not present

## 2018-05-27 DIAGNOSIS — H01002 Unspecified blepharitis right lower eyelid: Secondary | ICD-10-CM | POA: Diagnosis not present

## 2018-05-27 DIAGNOSIS — H01003 Unspecified blepharitis right eye, unspecified eyelid: Secondary | ICD-10-CM | POA: Diagnosis not present

## 2018-05-27 DIAGNOSIS — Z947 Corneal transplant status: Secondary | ICD-10-CM | POA: Diagnosis not present

## 2018-05-27 DIAGNOSIS — H0100A Unspecified blepharitis right eye, upper and lower eyelids: Secondary | ICD-10-CM | POA: Diagnosis not present

## 2018-05-27 DIAGNOSIS — H18822 Corneal disorder due to contact lens, left eye: Secondary | ICD-10-CM | POA: Diagnosis not present

## 2018-05-27 DIAGNOSIS — Z79899 Other long term (current) drug therapy: Secondary | ICD-10-CM | POA: Diagnosis not present

## 2018-05-27 DIAGNOSIS — H01004 Unspecified blepharitis left upper eyelid: Secondary | ICD-10-CM | POA: Diagnosis not present

## 2018-05-27 DIAGNOSIS — H348312 Tributary (branch) retinal vein occlusion, right eye, stable: Secondary | ICD-10-CM | POA: Diagnosis not present

## 2018-05-27 DIAGNOSIS — H0100B Unspecified blepharitis left eye, upper and lower eyelids: Secondary | ICD-10-CM | POA: Diagnosis not present

## 2018-05-27 DIAGNOSIS — H04123 Dry eye syndrome of bilateral lacrimal glands: Secondary | ICD-10-CM | POA: Diagnosis not present

## 2018-05-27 DIAGNOSIS — Z961 Presence of intraocular lens: Secondary | ICD-10-CM | POA: Diagnosis not present

## 2018-06-19 DIAGNOSIS — E7800 Pure hypercholesterolemia, unspecified: Secondary | ICD-10-CM | POA: Diagnosis not present

## 2018-06-19 DIAGNOSIS — Z125 Encounter for screening for malignant neoplasm of prostate: Secondary | ICD-10-CM | POA: Diagnosis not present

## 2018-06-19 DIAGNOSIS — I10 Essential (primary) hypertension: Secondary | ICD-10-CM | POA: Diagnosis not present

## 2018-06-24 DIAGNOSIS — E7800 Pure hypercholesterolemia, unspecified: Secondary | ICD-10-CM | POA: Diagnosis not present

## 2018-06-24 DIAGNOSIS — M109 Gout, unspecified: Secondary | ICD-10-CM | POA: Diagnosis not present

## 2018-06-24 DIAGNOSIS — Z1159 Encounter for screening for other viral diseases: Secondary | ICD-10-CM | POA: Diagnosis not present

## 2018-06-24 DIAGNOSIS — K219 Gastro-esophageal reflux disease without esophagitis: Secondary | ICD-10-CM | POA: Diagnosis not present

## 2018-06-24 DIAGNOSIS — Z7982 Long term (current) use of aspirin: Secondary | ICD-10-CM | POA: Diagnosis not present

## 2018-06-24 DIAGNOSIS — N62 Hypertrophy of breast: Secondary | ICD-10-CM | POA: Diagnosis not present

## 2018-06-24 DIAGNOSIS — J309 Allergic rhinitis, unspecified: Secondary | ICD-10-CM | POA: Diagnosis not present

## 2018-06-24 DIAGNOSIS — B351 Tinea unguium: Secondary | ICD-10-CM | POA: Diagnosis not present

## 2018-06-24 DIAGNOSIS — J387 Other diseases of larynx: Secondary | ICD-10-CM | POA: Diagnosis not present

## 2018-06-24 DIAGNOSIS — I10 Essential (primary) hypertension: Secondary | ICD-10-CM | POA: Diagnosis not present

## 2018-06-24 DIAGNOSIS — Z0001 Encounter for general adult medical examination with abnormal findings: Secondary | ICD-10-CM | POA: Diagnosis not present

## 2018-06-24 DIAGNOSIS — G629 Polyneuropathy, unspecified: Secondary | ICD-10-CM | POA: Diagnosis not present

## 2018-06-24 DIAGNOSIS — G4733 Obstructive sleep apnea (adult) (pediatric): Secondary | ICD-10-CM | POA: Diagnosis not present

## 2018-07-16 ENCOUNTER — Other Ambulatory Visit: Payer: Self-pay

## 2018-07-16 ENCOUNTER — Ambulatory Visit (INDEPENDENT_AMBULATORY_CARE_PROVIDER_SITE_OTHER): Payer: Medicare Other | Admitting: Neurology

## 2018-07-16 ENCOUNTER — Encounter: Payer: Self-pay | Admitting: Neurology

## 2018-07-16 VITALS — BP 142/70 | HR 56 | Ht 70.5 in | Wt 196.0 lb

## 2018-07-16 DIAGNOSIS — G4733 Obstructive sleep apnea (adult) (pediatric): Secondary | ICD-10-CM | POA: Diagnosis not present

## 2018-07-16 DIAGNOSIS — G629 Polyneuropathy, unspecified: Secondary | ICD-10-CM | POA: Diagnosis not present

## 2018-07-16 DIAGNOSIS — R55 Syncope and collapse: Secondary | ICD-10-CM | POA: Diagnosis not present

## 2018-07-16 DIAGNOSIS — G250 Essential tremor: Secondary | ICD-10-CM | POA: Diagnosis not present

## 2018-07-16 DIAGNOSIS — R208 Other disturbances of skin sensation: Secondary | ICD-10-CM | POA: Diagnosis not present

## 2018-07-16 MED ORDER — PRIMIDONE 50 MG PO TABS: ORAL_TABLET | ORAL | 11 refills | Status: DC

## 2018-07-16 MED ORDER — LAMOTRIGINE 150 MG PO TABS: 300.0000 mg | ORAL_TABLET | Freq: Two times a day (BID) | ORAL | 12 refills | Status: DC

## 2018-07-16 NOTE — Progress Notes (Signed)
\    [facility]  PATIENT: [name] DOB: 09-06-1945  REFERRING CLINICIAN: Deland Pretty HISTORY FROM: Patient REASON FOR VISIT: Polyneuropathy and tremors   HISTORICAL  CHIEF COMPLAINT:  Chief Complaint  Patient presents with  . Follow-up    RM 12, alone. Last seen 01/14/18. Could not tolerate gabapentin. Made him very groggy in the morning. Does not take this. He feels about the same as last visit, no improvement. He did not sleep well last night.  . Tremors    Taking atenolol 50mg  BID and primidone 50mg  BID  . dysesthesia    Taking lamotrigine 150mg  2 tabs BID    HISTORY OF PRESENT ILLNESS:  [name] is a .agfe man with polyneuropathy/dysesthesias, tremors and pre-syncopal spells.  Update 07/16/2018: He feels most of his symptoms are stable.   Balance is poor but no falls.   He denies lightheadedness spells since the last visit.  Tremors are stable.   He is on atenolol.   Primidone made him sleepy so he took just a few.    We discussed that if he took it regularly this might improve.      The dysesthesias from polyneuropathy in his legs are about the same.    He continues to use CPAP nightly for his OSA.  Update 01/14/2018: He had a week or so of dizziness that was lightheaded.    He felt balance was off and he stumbled but no falls.    The first episode was at night while reading in bed.    He denies any palpitations.   No visual changes or headaches.   The episodes last 4-5 hours   BP was checked with some of the spells but it was slightly elevated but not high, highest was 156/83 but most readings 120's/60's.   He has been on atenolol x many years.    He is feeling better again and no spells last 5 days.      The polyneuropathy tingling/pain is better on lamotrigine.   He tolerates it well.    Balance is off in the dark.     He feels the tremor is slightly wore but not bad enough to add a second medication.   Writing is worse.   He has  difficulty putting in his contacts.     He has OSA and uses CPAP nightly.   He tolerates it well.     Insomnia is now rare.     From 03/27/2017:  POLYNEUROPATHY:  He has numbness/dysesthesias and reduced balance.  Numbness is from mid shin to the foot.   Extent is unchanged.    Lamotrigine has helped the pain but not hte numbness,  His dose is lamotrigine 300 mg by mouth twice a day and he tolerates it well.   Neurontin and a tricyclic antidepressant did not help the pain. Blood work including B12, cryoglobulins and electrophoresis had been essentially normal in the past, last tested in 2013. Nerve conduction study in the past was mildly abnormal but more recent one showed good functioning of the large fibers.  TREMOR:  He has an essential tremor in both hands, worse on his left.   It is worse after a shower.The tremor is worse when he writes, types or tries to put in his contacts and when upset or anxious. Atenolol helps some.       OSA/INSOMNIA:   He has OSA and uses CPAP nightly.   He tolerates it well and feels more  refreshed during the day.   He is less likely to doze off on CPAP that he was before he started using it.    He denies excessive daytime sleepiness.    Insomnia does well mst nights and he takes Ambien if his legs are bothering him more with benefit.     PRESYNCOPE SPELLS:   He denies any more events of lightheadedness/dizziness.   The event monitor was normal.    He denies any TIA type symptoms.      MRA shows mild intracranial stenosis.  He is on simvastatin and ASA    REVIEW OF SYSTEMS:  Constitutional: No fevers, chills, sweats, or change in appetite Eyes: No visual changes, double vision, eye pain Ear, nose and throat: No hearing loss, ear pain, nasal congestion, sore throat Cardiovascular: No chest pain, palpitations Respiratory:  No shortness of breath at rest or with exertion.   No wheezes   He has OSA and is on CPAP GastrointestinaI: No nausea, vomiting, diarrhea,  abdominal pain, fecal incontinence Genitourinary:  No dysuria, urinary retention or frequency.  No nocturia. Musculoskeletal:  No neck pain, back pain Integumentary: No rash, pruritus, skin lesions Neurological: as above Psychiatric: No depression at this time.  No anxiety Endocrine: No palpitations, diaphoresis, change in appetite, change in weigh or increased thirst Hematologic/Lymphatic:  No anemia, purpura, petechiae. Allergic/Immunologic: No itchy/runny eyes, nasal congestion, recent allergic reactions, rashes  ALLERGIES: Allergies  Allergen Reactions  . Codeine      nausea  . Doxycycline     Hives and itching  . Penicillins     Swelling, rash  . Sulfa Antibiotics     HOME MEDICATIONS: Outpatient Medications Prior to Visit  Medication Sig Dispense Refill  . allopurinol (ZYLOPRIM) 100 MG tablet Take 100 mg by mouth daily.      Marland Kitchen aspirin 81 MG tablet Take 81 mg by mouth daily.      Marland Kitchen atenolol (TENORMIN) 50 MG tablet Take 50 mg by mouth 2 (two) times daily.     . Cholecalciferol (VITAMIN D3) 1000 UNITS tablet Take 1,000 Units by mouth daily.      . clobetasol cream (TEMOVATE) 0.05 % Apply topically.    . felodipine (PLENDIL) 5 MG 24 hr tablet Take 5 mg by mouth daily.      . Fiber CAPS Take 1 capsule by mouth daily.      . meloxicam (MOBIC) 15 MG tablet     . minoxidil (LONITEN) 10 MG tablet Take 40 mg by mouth daily.     . Multiple Vitamin (MULTIVITAMIN) tablet Take 1 tablet by mouth daily.      . prednisoLONE acetate (PRED FORTE) 1 % ophthalmic suspension Place 1 drop into the left eye at bedtime.     Marland Kitchen Respiratory Therapy Supplies (FLUTTER) DEVI as directed.    . simvastatin (ZOCOR) 20 MG tablet Take 20 mg by mouth daily.    Marland Kitchen zolpidem (AMBIEN) 10 MG tablet Take 10 mg by mouth at bedtime as needed.      . lamoTRIgine (LAMICTAL) 150 MG tablet TAKE TWO TABLETS BY MOUTH TWICE A DAY 120 tablet 10  . primidone (MYSOLINE) 50 MG tablet One po bid 60 tablet 5   No  facility-administered medications prior to visit.     PAST MEDICAL HISTORY: Past Medical History:  Diagnosis Date  . Cancer ([place])   . Elevated diaphragm   . Gout   . History of prostate cancer Feb 1993  . Hyperlipidemia   .  Hypertension   . Hypoxemia   . Normocytic anemia   . OSA (obstructive sleep apnea)   . Peripheral neuropathy   . Pneumonia   . Vision abnormalities     PAST SURGICAL HISTORY: Past Surgical History:  Procedure Laterality Date  . APPENDECTOMY    . PROSTATECTOMY    . SHOULDER SURGERY     bone spur removed from right    FAMILY HISTORY: Family History  Problem Relation Age of Onset  . Emphysema Father   . Congestive Heart Failure Mother   . Emphysema Brother   . Asthma Daughter     SOCIAL HISTORY:  Social History   Socioeconomic History  . Marital status: Married    Spouse name: Not on file  . Number of children: 2  . Years of education: Not on file  . Highest education level: Not on file  Occupational History  . Occupation: Retired    Comment: Audiological scientist  . Financial resource strain: Not on file  . Food insecurity:    Worry: Not on file    Inability: Not on file  . Transportation needs:    Medical: Not on file    Non-medical: Not on file  Tobacco Use  . Smoking status: Never Smoker  . Smokeless tobacco: Never Used  Substance and Sexual Activity  . Alcohol use: Yes    Comment: occasionally  . Drug use: No  . Sexual activity: Not on file  Lifestyle  . Physical activity:    Days per week: Not on file    Minutes per session: Not on file  . Stress: Not on file  Relationships  . Social connections:    Talks on phone: Not on file    Gets together: Not on file    Attends religious service: Not on file    Active member of club or organization: Not on file    Attends meetings of clubs or organizations: Not on file    Relationship status: Not on file  . Intimate partner violence:    Fear of current or ex partner: Not on  file    Emotionally abused: Not on file    Physically abused: Not on file    Forced sexual activity: Not on file  Other Topics Concern  . Not on file  Social History Narrative  . Not on file     PHYSICAL EXAM  Vitals:   07/16/18 1100  BP: (!) 142/70  Pulse: (!) 56  SpO2: 96%  Weight: 196 lb (88.9 kg)  Height: 5' 10.5" (1.791 m)    Body mass index is 27.73 kg/m.   General: The patient is well-developed and well-nourished and in no acute distress.        Neurologic Exam  Mental status: The patient is alert and oriented x 3 at the time of the examination. The patient has apparent normal recent and remote memory, with an apparently normal attention span and concentration ability.   Speech is normal.  Cranial nerves: Extraocular movements are full.  Facial strength and sensation is normal.. The left pupil is irregular (old) trapezius and sternocleidomastoid strength is normal. No dysarthria is noted.  Hearing is symmetric.   Motor:  Muscle bulk is reduecd in the calves.   Muscle tone is normal. Strength is  5 / 5 in all 4 extremities.     Sensory: Sensation is intact to temperature, touch and vibration in the arms. He has reduced vibration sensation at the ankles (25%) and  almost absent sensation at the toes.. He has a gradient of temperature sensation in the shin.   Coordination: He has good finger-nose-finger and heel-to-shin.  Gait and station: Station is stable with the eyes open.  His gait is fairly normal for age.  The tandem gait is mildly wide.  The Romberg is borderline.  Reflexes: Deep tendon reflexes are increased and he has crossed abductor responses at the knees    DIAGNOSTIC DATA (LABS, IMAGING, TESTING) - I reviewed patient records, labs, notes, testing and imaging myself where available.  Lab Results  Component Value Date   WBC 7.4 07/18/2016   HGB 15.4 07/18/2016   HCT 45.4 07/18/2016   MCV 91.0 07/18/2016   PLT 183.0 07/18/2016      Component  Value Date/Time   NA 140 01/24/2015 1540   K 4.1 01/24/2015 1540   CL 103 01/24/2015 1540   CO2 27 01/24/2015 1540   GLUCOSE 133 (H) 01/24/2015 1540   BUN 16 01/24/2015 1540   CREATININE 0.95 01/24/2015 1540   CALCIUM 9.3 01/24/2015 1540   PROT 7.6 01/24/2015 1540   ALBUMIN 4.0 01/24/2015 1540   AST 18 01/24/2015 1540   ALT 12 (L) 01/24/2015 1540   ALKPHOS 98 01/24/2015 1540   BILITOT 0.5 01/24/2015 1540   GFRNONAA >60 01/24/2015 1540   GFRAA >60 01/24/2015 1540      ASSESSMENT AND PLAN  Polyneuropathy  Essential tremor  Dysesthesia  Near syncope  OSA (obstructive sleep apnea)   1.    Continue atenolol for tremor.   Retry  primidone 50 mg twice daily and titrate up as needed.  He may need a couple weeks to get to a point where the sleepiness improves but will stop if it does not get better.   2.    He has not had any more spells of TAA, .presyncope 3.   Continue lamotrigine for dysesthesia.  Refill prescription 4.   Continue CPAP for OSA.  RTC 12 months, sooner if problems  Kenneisha Cochrane A. Felecia Shelling, MD, PhD 09/98/3382, 50:53 AM Certified in Neurology, Clinical Neurophysiology, Sleep Medicine, Pain Medicine and Neuroimaging  [facility] [address], [place], [place] [address] [phone]

## 2018-08-26 ENCOUNTER — Telehealth: Payer: Self-pay | Admitting: Pulmonary Disease

## 2018-08-26 NOTE — Telephone Encounter (Signed)
I have received the forms from RA's cubby. Will have him to sign forms and fax today.

## 2018-08-29 NOTE — Telephone Encounter (Signed)
Left detailed message for patient's wife that I have faxed the paperwork to [facility]. Will hold onto RX just in case the fax does not go through.

## 2018-10-13 DIAGNOSIS — R0789 Other chest pain: Secondary | ICD-10-CM | POA: Diagnosis not present

## 2018-10-13 DIAGNOSIS — M545 Low back pain: Secondary | ICD-10-CM | POA: Diagnosis not present

## 2018-10-13 DIAGNOSIS — M546 Pain in thoracic spine: Secondary | ICD-10-CM | POA: Diagnosis not present

## 2018-10-13 DIAGNOSIS — I10 Essential (primary) hypertension: Secondary | ICD-10-CM | POA: Diagnosis not present

## 2018-10-13 DIAGNOSIS — M5136 Other intervertebral disc degeneration, lumbar region: Secondary | ICD-10-CM | POA: Diagnosis not present

## 2018-10-13 DIAGNOSIS — M47816 Spondylosis without myelopathy or radiculopathy, lumbar region: Secondary | ICD-10-CM | POA: Diagnosis not present

## 2018-10-13 DIAGNOSIS — M47814 Spondylosis without myelopathy or radiculopathy, thoracic region: Secondary | ICD-10-CM | POA: Diagnosis not present

## 2018-10-13 DIAGNOSIS — M47896 Other spondylosis, lumbar region: Secondary | ICD-10-CM | POA: Diagnosis not present

## 2018-10-13 DIAGNOSIS — M5137 Other intervertebral disc degeneration, lumbosacral region: Secondary | ICD-10-CM | POA: Diagnosis not present

## 2018-10-13 DIAGNOSIS — M5134 Other intervertebral disc degeneration, thoracic region: Secondary | ICD-10-CM | POA: Diagnosis not present

## 2018-10-16 DIAGNOSIS — J984 Other disorders of lung: Secondary | ICD-10-CM | POA: Diagnosis not present

## 2018-10-16 DIAGNOSIS — R0789 Other chest pain: Secondary | ICD-10-CM | POA: Diagnosis not present

## 2018-10-16 DIAGNOSIS — J986 Disorders of diaphragm: Secondary | ICD-10-CM | POA: Diagnosis not present

## 2018-11-19 DIAGNOSIS — L308 Other specified dermatitis: Secondary | ICD-10-CM | POA: Diagnosis not present

## 2018-12-30 DIAGNOSIS — M4716 Other spondylosis with myelopathy, lumbar region: Secondary | ICD-10-CM | POA: Diagnosis not present

## 2018-12-30 DIAGNOSIS — M546 Pain in thoracic spine: Secondary | ICD-10-CM | POA: Diagnosis not present

## 2018-12-30 DIAGNOSIS — M47816 Spondylosis without myelopathy or radiculopathy, lumbar region: Secondary | ICD-10-CM | POA: Diagnosis not present

## 2019-04-28 DIAGNOSIS — Z23 Encounter for immunization: Secondary | ICD-10-CM | POA: Diagnosis not present

## 2019-06-24 DIAGNOSIS — E7800 Pure hypercholesterolemia, unspecified: Secondary | ICD-10-CM | POA: Diagnosis not present

## 2019-06-24 DIAGNOSIS — Z125 Encounter for screening for malignant neoplasm of prostate: Secondary | ICD-10-CM | POA: Diagnosis not present

## 2019-06-24 DIAGNOSIS — I10 Essential (primary) hypertension: Secondary | ICD-10-CM | POA: Diagnosis not present

## 2019-06-29 DIAGNOSIS — G4733 Obstructive sleep apnea (adult) (pediatric): Secondary | ICD-10-CM | POA: Diagnosis not present

## 2019-06-29 DIAGNOSIS — Z791 Long term (current) use of non-steroidal anti-inflammatories (NSAID): Secondary | ICD-10-CM | POA: Diagnosis not present

## 2019-06-29 DIAGNOSIS — I700 Atherosclerosis of aorta: Secondary | ICD-10-CM | POA: Diagnosis not present

## 2019-06-29 DIAGNOSIS — M109 Gout, unspecified: Secondary | ICD-10-CM | POA: Diagnosis not present

## 2019-06-29 DIAGNOSIS — G4700 Insomnia, unspecified: Secondary | ICD-10-CM | POA: Diagnosis not present

## 2019-06-29 DIAGNOSIS — J387 Other diseases of larynx: Secondary | ICD-10-CM | POA: Diagnosis not present

## 2019-06-29 DIAGNOSIS — Z1212 Encounter for screening for malignant neoplasm of rectum: Secondary | ICD-10-CM | POA: Diagnosis not present

## 2019-06-29 DIAGNOSIS — G629 Polyneuropathy, unspecified: Secondary | ICD-10-CM | POA: Diagnosis not present

## 2019-06-29 DIAGNOSIS — J479 Bronchiectasis, uncomplicated: Secondary | ICD-10-CM | POA: Diagnosis not present

## 2019-06-29 DIAGNOSIS — Z0001 Encounter for general adult medical examination with abnormal findings: Secondary | ICD-10-CM | POA: Diagnosis not present

## 2019-06-29 DIAGNOSIS — I440 Atrioventricular block, first degree: Secondary | ICD-10-CM | POA: Diagnosis not present

## 2019-06-29 DIAGNOSIS — I10 Essential (primary) hypertension: Secondary | ICD-10-CM | POA: Diagnosis not present

## 2019-07-01 ENCOUNTER — Other Ambulatory Visit: Payer: Self-pay

## 2019-07-20 ENCOUNTER — Ambulatory Visit: Payer: Medicare Other | Admitting: Neurology

## 2019-07-21 ENCOUNTER — Encounter: Payer: Self-pay | Admitting: Neurology

## 2019-08-06 ENCOUNTER — Other Ambulatory Visit: Payer: Self-pay | Admitting: Neurology

## 2019-08-14 ENCOUNTER — Other Ambulatory Visit: Payer: Self-pay | Admitting: Neurology

## 2019-09-02 ENCOUNTER — Ambulatory Visit: Payer: Medicare Other

## 2019-09-11 ENCOUNTER — Ambulatory Visit: Payer: Medicare Other

## 2019-10-01 ENCOUNTER — Encounter: Payer: Self-pay | Admitting: Neurology

## 2019-10-01 ENCOUNTER — Ambulatory Visit (INDEPENDENT_AMBULATORY_CARE_PROVIDER_SITE_OTHER): Payer: Medicare PPO | Admitting: Neurology

## 2019-10-01 ENCOUNTER — Other Ambulatory Visit: Payer: Self-pay

## 2019-10-01 VITALS — BP 198/83 | HR 58 | Temp 97.9°F | Ht 70.0 in | Wt 196.5 lb

## 2019-10-01 DIAGNOSIS — G629 Polyneuropathy, unspecified: Secondary | ICD-10-CM

## 2019-10-01 DIAGNOSIS — I10 Essential (primary) hypertension: Secondary | ICD-10-CM

## 2019-10-01 DIAGNOSIS — R208 Other disturbances of skin sensation: Secondary | ICD-10-CM

## 2019-10-01 DIAGNOSIS — G250 Essential tremor: Secondary | ICD-10-CM

## 2019-10-01 MED ORDER — CLONAZEPAM 0.5 MG PO TABS: 0.5000 mg | ORAL_TABLET | Freq: Two times a day (BID) | ORAL | 3 refills | Status: DC

## 2019-10-01 NOTE — Progress Notes (Signed)
[facility]  PATIENT: [name] DOB: Dec 16, 1945  REFERRING CLINICIAN: Deland Pretty HISTORY FROM: Patient REASON FOR VISIT: Polyneuropathy and tremors   HISTORICAL  CHIEF COMPLAINT:  Chief Complaint  Patient presents with  . Follow-up    RM 12, alone. Last seen 07/16/2018. He was supposed to come 07/2019 he was having sciatic issues. Has improved since, he is taking meloxicam.   . Tremors    about the same as last visit. Depends on the day. Left hand worse than right.     HISTORY OF PRESENT ILLNESS:  [name] is a 74 y.o. 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  Update 10/01/19: Tremors are a little worse, left > right (he is right handed).    He is on atenolol 50 mg bid and tolerates it well.   BP has been normal when measured at home but was elevated today in the office.   Primidone was not tolerated (made him sleep so he just took a few days).  The painful dysesthesias are better with lamotrigine and he tolerates it well.  His balance is a little off.   He has had no further episode of presyncope.  He tries to stay active and walks some daily.    He gets pain in his legs if he walks longer distance.    He sometimes ahs lower back pain.    He is on CPAP and tolerating well.  Update 07/16/2018: He feels most of his symptoms are stable.   Balance is poor but no falls.   He denies lightheadedness spells since the last visit.  Tremors are stable.   He is on atenolol.   Primidone made him sleepy so he took just a few.    We discussed that if he took it regularly this might improve.      The dysesthesias from polyneuropathy in his legs are about the same.    He continues to use CPAP nightly for his OSA.  Update 01/14/2018: He had a week or so of dizziness that was lightheaded.    He felt balance was off and he stumbled but no falls.    The first episode was at night while reading in bed.    He denies any palpitations.    No visual changes or headaches.   The episodes last 4-5 hours   BP was checked with some of the spells but it was slightly elevated but not high, highest was 156/83 but most readings 120's/60's.   He has been on atenolol x many years.    He is feeling better again and no spells last 5 days.      The polyneuropathy tingling/pain is better on lamotrigine.   He tolerates it well.    Balance is off in the dark.     He feels the tremor is slightly wore but not bad enough to add a second medication.   Writing is worse.   He has difficulty putting in his contacts.     He has OSA and uses CPAP nightly.   He tolerates it well.     Insomnia is now rare.     From 03/27/2017:  POLYNEUROPATHY:  He has numbness/dysesthesias and reduced balance.  Numbness is from mid shin to the foot.   Extent is unchanged.    Lamotrigine has helped the pain but not hte numbness,  His dose is lamotrigine 300 mg by mouth twice a day and he tolerates it well.   Neurontin  and a tricyclic antidepressant did not help the pain. Blood work including B12, cryoglobulins and electrophoresis had been essentially normal in the past, last tested in 2013. Nerve conduction study in the past was mildly abnormal but more recent one showed good functioning of the large fibers.  TREMOR:  He has an essential tremor in both hands, worse on his left.   It is worse after a shower.The tremor is worse when he writes, types or tries to put in his contacts and when upset or anxious. Atenolol helps some.       OSA/INSOMNIA:   He has OSA and uses CPAP nightly.   He tolerates it well and feels more refreshed during the day.   He is less likely to doze off on CPAP that he was before he started using it.    He denies excessive daytime sleepiness.    Insomnia does well mst nights and he takes Ambien if his legs are bothering him more with benefit.     PRESYNCOPE SPELLS:   He denies any more events of lightheadedness/dizziness.   The event monitor was normal.     He denies any TIA type symptoms.      MRA shows mild intracranial stenosis.  He is on simvastatin and ASA    REVIEW OF SYSTEMS:  Constitutional: No fevers, chills, sweats, or change in appetite Eyes: No visual changes, double vision, eye pain Ear, nose and throat: No hearing loss, ear pain, nasal congestion, sore throat Cardiovascular: No chest pain, palpitations Respiratory:  No shortness of breath at rest or with exertion.   No wheezes   He has OSA and is on CPAP GastrointestinaI: No nausea, vomiting, diarrhea, abdominal pain, fecal incontinence Genitourinary:  No dysuria, urinary retention or frequency.  No nocturia. Musculoskeletal:  No neck pain, back pain Integumentary: No rash, pruritus, skin lesions Neurological: as above Psychiatric: No depression at this time.  No anxiety Endocrine: No palpitations, diaphoresis, change in appetite, change in weigh or increased thirst Hematologic/Lymphatic:  No anemia, purpura, petechiae. Allergic/Immunologic: No itchy/runny eyes, nasal congestion, recent allergic reactions, rashes  ALLERGIES: Allergies  Allergen Reactions  . Codeine      nausea  . Doxycycline     Hives and itching  . Penicillins     Swelling, rash  . Sulfa Antibiotics     HOME MEDICATIONS: Outpatient Medications Prior to Visit  Medication Sig Dispense Refill  . allopurinol (ZYLOPRIM) 100 MG tablet Take 100 mg by mouth daily.      Marland Kitchen aspirin 81 MG tablet Take 81 mg by mouth daily.      Marland Kitchen atenolol (TENORMIN) 50 MG tablet Take 50 mg by mouth 2 (two) times daily.     . Cholecalciferol (VITAMIN D3) 1000 UNITS tablet Take 1,000 Units by mouth daily.      . clobetasol cream (TEMOVATE) 0.05 % Apply topically.    . felodipine (PLENDIL) 5 MG 24 hr tablet Take 5 mg by mouth daily.      . Fiber CAPS Take 1 capsule by mouth daily.      Marland Kitchen lamoTRIgine (LAMICTAL) 150 MG tablet TAKE TWO TABLETS BY MOUTH TWICE A DAY 120 tablet 1  . meloxicam (MOBIC) 15 MG tablet     . minoxidil  (LONITEN) 10 MG tablet Take 40 mg by mouth daily.     . Multiple Vitamin (MULTIVITAMIN) tablet Take 1 tablet by mouth daily.      . prednisoLONE acetate (PRED FORTE) 1 % ophthalmic suspension Place 1 drop  into the left eye at bedtime.     . primidone (MYSOLINE) 50 MG tablet One po bid 60 tablet 11  . Respiratory Therapy Supplies (FLUTTER) DEVI as directed.    . simvastatin (ZOCOR) 20 MG tablet Take 20 mg by mouth daily.    Marland Kitchen zolpidem (AMBIEN) 10 MG tablet Take 10 mg by mouth at bedtime as needed.       No facility-administered medications prior to visit.    PAST MEDICAL HISTORY: Past Medical History:  Diagnosis Date  . Cancer ([place])   . Elevated diaphragm   . Gout   . History of prostate cancer Feb 1993  . Hyperlipidemia   . Hypertension   . Hypoxemia   . Normocytic anemia   . OSA (obstructive sleep apnea)   . Peripheral neuropathy   . Pneumonia   . Vision abnormalities     PAST SURGICAL HISTORY: Past Surgical History:  Procedure Laterality Date  . APPENDECTOMY    . PROSTATECTOMY    . SHOULDER SURGERY     bone spur removed from right    FAMILY HISTORY: Family History  Problem Relation Age of Onset  . Emphysema Father   . Congestive Heart Failure Mother   . Emphysema Brother   . Asthma Daughter     SOCIAL HISTORY:  Social History   Socioeconomic History  . Marital status: Married    Spouse name: Not on file  . Number of children: 2  . Years of education: Not on file  . Highest education level: Not on file  Occupational History  . Occupation: Retired    Comment: Company secretary  Tobacco Use  . Smoking status: Never Smoker  . Smokeless tobacco: Never Used  Substance and Sexual Activity  . Alcohol use: Yes    Comment: occasionally  . Drug use: No  . Sexual activity: Not on file  Other Topics Concern  . Not on file  Social History Narrative  . Not on file   Social Determinants of Health   Financial Resource Strain:   . Difficulty of Paying Living  Expenses: Not on file  Food Insecurity:   . Worried About Charity fundraiser in the Last Year: Not on file  . Ran Out of Food in the Last Year: Not on file  Transportation Needs:   . Lack of Transportation (Medical): Not on file  . Lack of Transportation (Non-Medical): Not on file  Physical Activity:   . Days of Exercise per Week: Not on file  . Minutes of Exercise per Session: Not on file  Stress:   . Feeling of Stress : Not on file  Social Connections:   . Frequency of Communication with Friends and Family: Not on file  . Frequency of Social Gatherings with Friends and Family: Not on file  . Attends Religious Services: Not on file  . Active Member of Clubs or Organizations: Not on file  . Attends Archivist Meetings: Not on file  . Marital Status: Not on file  Intimate Partner Violence:   . Fear of Current or Ex-Partner: Not on file  . Emotionally Abused: Not on file  . Physically Abused: Not on file  . Sexually Abused: Not on file     PHYSICAL EXAM  Vitals:   10/01/19 1458 10/01/19 1536  BP: (!) 213/89 (!) 198/83  Pulse: 60 (!) 58  Temp: 97.9 F (36.6 C)   Weight: 196 lb 8 oz (89.1 kg)   Height: 5\' 10"  (1.778 m)  Body mass index is 28.19 kg/m.   General: The patient is well-developed and well-nourished and in no acute distress.        Neurologic Exam  Mental status: The patient is alert and oriented x 3 at the time of the examination. The patient has apparent normal recent and remote memory, with an apparently normal attention span and concentration ability.   Speech is normal.  Cranial nerves: Extraocular movements are full.  Facial strength and sensation is normal.. The left pupil is irregular (old) trapezius and sternocleidomastoid strength is normal. No dysarthria is noted.  Hearing is symmetric.   Motor:  Muscle bulk is reduecd in the calves.   Muscle tone is normal. Strength is  5 / 5 in all 4 extremities.     Sensory: Sensation is intact  to temperature, touch and vibration in the arms.  Vibration sensation is reduced below 25% at the ankles and absent at the toes.. He has a gradient of temperature sensation in the shin.   Coordination: He has good finger-nose-finger and heel-to-shin.  Gait and station: Station is stable with the eyes open.  His gait is fairly normal for age.  Tandem gait is mildly wide.  He has a couple steps of retropulsion.  Romberg is borderline.  Reflexes: Deep tendon reflexes are increased at the knees.    DIAGNOSTIC DATA (LABS, IMAGING, TESTING) - I reviewed patient records, labs, notes, testing and imaging myself where available.  Lab Results  Component Value Date   WBC 7.4 07/18/2016   HGB 15.4 07/18/2016   HCT 45.4 07/18/2016   MCV 91.0 07/18/2016   PLT 183.0 07/18/2016      Component Value Date/Time   NA 140 01/24/2015 1540   K 4.1 01/24/2015 1540   CL 103 01/24/2015 1540   CO2 27 01/24/2015 1540   GLUCOSE 133 (H) 01/24/2015 1540   BUN 16 01/24/2015 1540   CREATININE 0.95 01/24/2015 1540   CALCIUM 9.3 01/24/2015 1540   PROT 7.6 01/24/2015 1540   ALBUMIN 4.0 01/24/2015 1540   AST 18 01/24/2015 1540   ALT 12 (L) 01/24/2015 1540   ALKPHOS 98 01/24/2015 1540   BILITOT 0.5 01/24/2015 1540   GFRNONAA >60 01/24/2015 1540   GFRAA >60 01/24/2015 1540      ASSESSMENT AND PLAN  Polyneuropathy  Essential tremor  Dysesthesia  Essential hypertension   1.    Continue atenolol for tremor.  Follow-up clonazepam. 2.     BP is elevated.  He will be take at home and discuss with primary care 3.   Continue lamotrigine for dysesthesia.   4.  RTC 12 months, sooner if problems  Derrien Anschutz A. Felecia Shelling, MD, PhD [id number], [id number] PM Certified in Neurology, Clinical Neurophysiology, Sleep Medicine, Pain Medicine and Neuroimaging  [name] [address] [place], [place] [address] [phone]

## 2019-10-08 DIAGNOSIS — I10 Essential (primary) hypertension: Secondary | ICD-10-CM | POA: Diagnosis not present

## 2019-10-11 ENCOUNTER — Other Ambulatory Visit: Payer: Self-pay | Admitting: Neurology

## 2019-10-15 DIAGNOSIS — I10 Essential (primary) hypertension: Secondary | ICD-10-CM | POA: Diagnosis not present

## 2019-10-15 DIAGNOSIS — H8110 Benign paroxysmal vertigo, unspecified ear: Secondary | ICD-10-CM | POA: Diagnosis not present

## 2019-10-23 DIAGNOSIS — G4733 Obstructive sleep apnea (adult) (pediatric): Secondary | ICD-10-CM | POA: Diagnosis not present

## 2019-10-23 DIAGNOSIS — J471 Bronchiectasis with (acute) exacerbation: Secondary | ICD-10-CM | POA: Diagnosis not present

## 2019-10-29 DIAGNOSIS — B351 Tinea unguium: Secondary | ICD-10-CM | POA: Diagnosis not present

## 2019-11-02 DIAGNOSIS — I10 Essential (primary) hypertension: Secondary | ICD-10-CM | POA: Diagnosis not present

## 2019-11-02 DIAGNOSIS — R079 Chest pain, unspecified: Secondary | ICD-10-CM | POA: Diagnosis not present

## 2019-11-09 DIAGNOSIS — I10 Essential (primary) hypertension: Secondary | ICD-10-CM | POA: Diagnosis not present

## 2019-11-19 ENCOUNTER — Other Ambulatory Visit: Payer: Self-pay

## 2019-11-19 ENCOUNTER — Encounter: Payer: Self-pay | Admitting: Cardiology

## 2019-11-19 ENCOUNTER — Telehealth: Payer: Self-pay

## 2019-11-19 ENCOUNTER — Ambulatory Visit: Payer: PRIVATE HEALTH INSURANCE | Admitting: Cardiology

## 2019-11-19 VITALS — BP 152/65 | HR 60 | Temp 97.3°F | Resp 16 | Ht 70.0 in | Wt 193.9 lb

## 2019-11-19 DIAGNOSIS — I10 Essential (primary) hypertension: Secondary | ICD-10-CM

## 2019-11-19 DIAGNOSIS — R0789 Other chest pain: Secondary | ICD-10-CM

## 2019-11-19 NOTE — Telephone Encounter (Signed)
Error

## 2019-11-19 NOTE — Progress Notes (Signed)
Patient referred by Deland Pretty, MD for chest pain  Subjective:   [name], male    DOB: [birth year], 74 y.o.   MRN: [id number]   Chief Complaint  Patient presents with  . Chest Pain  . Hypertension  . New Patient (Initial Visit)     HPI  74 y.o. Caucasian male with hypertension, h/o cancer, peripheral neuropathy, chest pain.  He had one episode of chest pain, sharp, worse with certain position, lasted for a couple of hours. He says that he has had this similar, "costochondritis" pain before. He doe snot have any chest pain with physical activity, such as climbing up stairs. Pain is now completely resolved.   He is on multiple antihypertensive medications, managed by Dr. Shelia Media.  Past Medical History:  Diagnosis Date  . Cancer ([place])   . Elevated diaphragm   . Gout   . History of prostate cancer Feb 1993  . Hyperlipidemia   . Hypertension   . Hypoxemia   . Normocytic anemia   . OSA (obstructive sleep apnea)   . Peripheral neuropathy   . Pneumonia   . Vision abnormalities      Past Surgical History:  Procedure Laterality Date  . APPENDECTOMY    . PROSTATECTOMY    . SHOULDER SURGERY     bone spur removed from right     Social History   Tobacco Use  Smoking Status Never Smoker  Smokeless Tobacco Never Used    Social History   Substance and Sexual Activity  Alcohol Use Yes   Comment: occasionally     Family History  Problem Relation Age of Onset  . Emphysema Father   . Congestive Heart Failure Mother   . Emphysema Brother   . Asthma Daughter      Current Outpatient Medications on File Prior to Visit  Medication Sig Dispense Refill  . allopurinol (ZYLOPRIM) 100 MG tablet Take 100 mg by mouth daily.      Marland Kitchen aspirin 81 MG tablet Take 81 mg by mouth daily.      Marland Kitchen atenolol (TENORMIN) 50 MG tablet Take 50 mg by mouth 2 (two) times daily.     . Cholecalciferol (VITAMIN D3) 1000 UNITS tablet Take 1,000 Units by mouth daily.      .  clobetasol cream (TEMOVATE) 0.05 % Apply topically.    . clonazePAM (KLONOPIN) 0.5 MG tablet Take 1 tablet (0.5 mg total) by mouth 2 (two) times daily. 60 tablet 3  . felodipine (PLENDIL) 5 MG 24 hr tablet Take 5 mg by mouth daily.      . Fiber CAPS Take 1 capsule by mouth daily.      Marland Kitchen lamoTRIgine (LAMICTAL) 150 MG tablet TAKE TWO TABLETS BY MOUTH TWICE A DAY 120 tablet 3  . meloxicam (MOBIC) 15 MG tablet     . minoxidil (LONITEN) 10 MG tablet Take 40 mg by mouth daily.     . Multiple Vitamin (MULTIVITAMIN) tablet Take 1 tablet by mouth daily.      . prednisoLONE acetate (PRED FORTE) 1 % ophthalmic suspension Place 1 drop into the left eye at bedtime.     . primidone (MYSOLINE) 50 MG tablet One po bid 60 tablet 11  . Respiratory Therapy Supplies (FLUTTER) DEVI as directed.    . simvastatin (ZOCOR) 20 MG tablet Take 20 mg by mouth daily.    Marland Kitchen zolpidem (AMBIEN) 10 MG tablet Take 10 mg by mouth at bedtime as needed.  No current facility-administered medications on file prior to visit.    Cardiovascular and other pertinent studies:   EKG 11/02/2019: Sinus rhythm 57 bpm. First degree AV block.  Recent labs: 06/24/2019: Glucose 89, BUN/Cr 16/1.1. EGFR 69. Na/K 145/4.8. Rest of the CMP normal H/H 13/40. MCV 90. Platelets 143 Chol 130, TG 94, HDL 44, LDL 67   Review of Systems  Cardiovascular: Negative for chest pain, dyspnea on exertion, leg swelling, palpitations and syncope.        Vitals:   11/19/19 1252  BP: (!) 152/65  Pulse: 60  Resp: 16  Temp: (!) 97.3 F (36.3 C)  SpO2: 96%     Body mass index is 27.82 kg/m. Filed Weights   11/19/19 1252  Weight: 193 lb 14.4 oz (88 kg)     Objective:   Physical Exam  Constitutional: He appears well-developed and well-nourished.  Neck: No JVD present.  Cardiovascular: Normal rate, regular rhythm, normal heart sounds and intact distal pulses.  No murmur heard. Pulmonary/Chest: Effort normal and breath sounds normal.  He has no wheezes. He has no rales.  Musculoskeletal:        General: No edema.  Nursing note and vitals reviewed.       Assessment & Recommendations:   74 y.o. Caucasian male with hypertension, h/o cancer, peripheral neuropathy, chest pain.  Chest pain: Unlikely angina. Completely resolved at this time. Will obtain baseline echocardiogram given his hypertension. If he has any recurrent episodes, will consider ischemia testing in future.  I will see him on as needed basis.    Thank you for referring the patient to [place]. Please feel free to contact with any questions.  Nigel Mormon, MD [facility] [place] Pager: [phone] Office: [phone]

## 2019-11-24 ENCOUNTER — Other Ambulatory Visit: Payer: Self-pay

## 2019-11-24 ENCOUNTER — Ambulatory Visit: Payer: PRIVATE HEALTH INSURANCE

## 2019-11-24 DIAGNOSIS — I10 Essential (primary) hypertension: Secondary | ICD-10-CM | POA: Diagnosis not present

## 2019-11-30 ENCOUNTER — Ambulatory Visit: Payer: Self-pay | Admitting: Cardiology

## 2019-12-01 NOTE — Progress Notes (Signed)
Spoke with patient regarding the echocardiogram results.

## 2019-12-16 NOTE — Progress Notes (Signed)
Follow up visit  Subjective:   [name], male    DOB: 05-30-1946, 74 y.o.   MRN: [id number]   HPI   Chief Complaint  Patient presents with  . Hypertension  . Follow-up  . Results    74 y.o. Caucasian male with hypertension, h/o cancer, peripheral neuropathy, chest pain.  Patient underwent echocardiogram, details below.  Discussed with the patient.  He has had longstanding peripheral neuropathy in his lower extremities for which she sees Dr. Rachel Moulds at [facility] neurology.  However, does not have any symptoms of carpal tunnel syndrome.  He is physically active, climbs a flight of 16 stairs multiple times a day without any symptoms of shortness of breath, orthopnea, PND, leg edema.  Initial consultation HPI 11/19/2019: He had one episode of chest pain, sharp, worse with certain position, lasted for a couple of hours. He says that he has had this similar, "costochondritis" pain before. He doe snot have any chest pain with physical activity, such as climbing up stairs. Pain is now completely resolved.   He is on multiple antihypertensive medications, managed by Dr. Shelia Media.   Current Outpatient Medications on File Prior to Visit  Medication Sig Dispense Refill  . allopurinol (ZYLOPRIM) 100 MG tablet Take 100 mg by mouth daily.      Marland Kitchen aspirin 81 MG tablet Take 81 mg by mouth daily.      Marland Kitchen atenolol (TENORMIN) 50 MG tablet Take 75 mg by mouth 2 (two) times daily. Take 1.51m BID    . Cholecalciferol (VITAMIN D3) 1000 UNITS tablet Take 1,000 Units by mouth daily.      . clobetasol cream (TEMOVATE) 0.05 % Apply topically.    . felodipine (PLENDIL) 5 MG 24 hr tablet Take 10 mg by mouth daily.     . Fiber CAPS Take 1 capsule by mouth daily.      .Marland KitchenlamoTRIgine (LAMICTAL) 150 MG tablet TAKE TWO TABLETS BY MOUTH TWICE A DAY 120 tablet 3  . meloxicam (MOBIC) 15 MG tablet     . Multiple Vitamin (MULTIVITAMIN) tablet Take 1 tablet by mouth daily.      . prednisoLONE acetate (PRED FORTE) 1  % ophthalmic suspension Place 1 drop into the left eye at bedtime.     . simvastatin (ZOCOR) 20 MG tablet Take 20 mg by mouth daily.    .Marland Kitchenzolpidem (AMBIEN) 10 MG tablet Take 10 mg by mouth at bedtime as needed.       No current facility-administered medications on file prior to visit.    Cardiovascular & other pertient studies:  EKG 12/18/2019: Probable sinus rhythm 59 bpm First degree A-V block  Nonspecific ST-T changes  Echocardiogram 11/24/2019:  Normal LV systolic function with visual EF 55-60%. Left ventricle cavity is normal in size. Severe left ventricular hypertrophy. Normal global wall motion. Doppler evidence of grade III (restrictive) diastolic dysfunction, elevated LAP. Calculated EF 54%.  Left atrial cavity is severely dilated.  Grossly right atrial size is dilated.  Mild (Grade I) mitral regurgitation.  Mild tricuspid regurgitation. Mild pulmonary hypertension. RVSP measures 44 mmHg.  Mild pulmonic regurgitation.  IVC is dilated with a respiratory response of <50%.  No prior study for comparison.  Recent labs: 06/24/2019: Glucose 89, BUN/Cr 16/1.1. EGFR 69. Na/K 145/4.8. Rest of the CMP normal H/H 13/40. MCV 90. Platelets 143 Chol 130, TG 94, HDL 44, LDL 67   Review of Systems  Cardiovascular: Negative for chest pain, dyspnea on exertion, leg swelling, palpitations and syncope.  Neurological: Positive for paresthesias (Lower extremeties).        Vitals:   12/18/19 0931 12/18/19 0953  BP: (!) 151/73 (!) 151/75  Pulse: 62 61  Resp: 16   Temp: 97.8 F (36.6 C)   SpO2: 94%      Body mass index is 28.12 kg/m. Filed Weights   12/18/19 0931  Weight: 196 lb (88.9 kg)     Objective:   Physical Exam  Constitutional: No distress.  Neck: No JVD present.  Cardiovascular: Normal rate, regular rhythm, normal heart sounds and intact distal pulses.  No murmur heard. Pulses:      Dorsalis pedis pulses are 2+ on the right side and 2+ on the left side.        Posterior tibial pulses are 2+ on the right side and 2+ on the left side.  Pulmonary/Chest: Effort normal and breath sounds normal. He has no wheezes. He has no rales.  Musculoskeletal:        General: No edema.  Nursing note and vitals reviewed.      Assessment & Recommendations:   74 y.o. Caucasian male with hypertension, h/o cancer, peripheral neuropathy, chest pain.  Chest pain: Unlikely angina. Completely resolved at this time.   Abnormal echocardiogram: Grade 2 diastolic dysfunction, severe left atrial enlargement.  Echocardiogram is suspicious for restrictive cardiomyopathy such as amyloidosis.  However, he has absolutely no clinical symptoms of heart failure.  Other differential remains paroxysmal A. fib, even though he is in sinus rhythm at rest.  I will repeat an echocardiogram with strain imaging in 3 months and see him for follow-up.  If he has any symptoms of heart failure, or further evidence of amyloidosis on strain imaging, will pursue further work-up.  Hypertension: BP elevated today, but 130s/70s at home.  Monitor for now.  Glenn Mormon, MD [facility] Pager: [phone] Office: [phone]

## 2019-12-18 ENCOUNTER — Encounter: Payer: Self-pay | Admitting: Cardiology

## 2019-12-18 ENCOUNTER — Other Ambulatory Visit: Payer: Self-pay

## 2019-12-18 ENCOUNTER — Ambulatory Visit: Payer: PRIVATE HEALTH INSURANCE | Admitting: Cardiology

## 2019-12-18 VITALS — BP 151/75 | HR 61 | Temp 97.8°F | Resp 16 | Ht 70.0 in | Wt 196.0 lb

## 2019-12-18 DIAGNOSIS — I10 Essential (primary) hypertension: Secondary | ICD-10-CM | POA: Diagnosis not present

## 2019-12-18 DIAGNOSIS — I5189 Other ill-defined heart diseases: Secondary | ICD-10-CM | POA: Insufficient documentation

## 2019-12-18 DIAGNOSIS — R0789 Other chest pain: Secondary | ICD-10-CM | POA: Diagnosis not present

## 2020-02-05 ENCOUNTER — Other Ambulatory Visit: Payer: Self-pay | Admitting: Neurology

## 2020-03-04 ENCOUNTER — Ambulatory Visit (HOSPITAL_COMMUNITY): Payer: Medicare PPO | Attending: Cardiovascular Disease

## 2020-03-04 ENCOUNTER — Other Ambulatory Visit: Payer: Self-pay

## 2020-03-04 DIAGNOSIS — R0789 Other chest pain: Secondary | ICD-10-CM | POA: Diagnosis not present

## 2020-03-04 LAB — ECHOCARDIOGRAM COMPLETE
Area-P 1/2: 3.21 cm2
S' Lateral: 3.8 cm

## 2020-03-21 ENCOUNTER — Ambulatory Visit: Payer: Medicare PPO | Admitting: Cardiology

## 2020-03-21 ENCOUNTER — Encounter: Payer: Self-pay | Admitting: Cardiology

## 2020-03-21 ENCOUNTER — Other Ambulatory Visit: Payer: Self-pay

## 2020-03-21 VITALS — BP 150/72 | HR 61 | Resp 16 | Ht 70.0 in | Wt 193.0 lb

## 2020-03-21 DIAGNOSIS — I5189 Other ill-defined heart diseases: Secondary | ICD-10-CM

## 2020-03-21 DIAGNOSIS — R0789 Other chest pain: Secondary | ICD-10-CM

## 2020-03-21 DIAGNOSIS — I10 Essential (primary) hypertension: Secondary | ICD-10-CM

## 2020-03-21 MED ORDER — LOSARTAN POTASSIUM 50 MG PO TABS: 50.0000 mg | ORAL_TABLET | Freq: Every day | ORAL | 3 refills | Status: DC

## 2020-03-21 NOTE — Progress Notes (Signed)
Follow up visit  Subjective:   [name], male    DOB: 1946/02/22, 74 y.o.   MRN: [id number]   HPI   Chief Complaint  Patient presents with   Chest Pain   Hypertension   Follow-up    3 month   Results    echo    74 y.o. Caucasian male with hypertension, h/o cancer, peripheral neuropathy, chest pain.  Patient underwent echocardiogram with strain imaging on 03/04/2020, details below.     BP remains elevated.   Current Outpatient Medications on File Prior to Visit  Medication Sig Dispense Refill   allopurinol (ZYLOPRIM) 100 MG tablet Take 100 mg by mouth daily.       aspirin 81 MG tablet Take 81 mg by mouth daily.       atenolol (TENORMIN) 50 MG tablet Take 75 mg by mouth 2 (two) times daily. Take 1.61m BID     Cholecalciferol (VITAMIN D3) 1000 UNITS tablet Take 1,000 Units by mouth daily.       clobetasol cream (TEMOVATE) 0.05 % Apply topically.     felodipine (PLENDIL) 5 MG 24 hr tablet Take 10 mg by mouth daily.      Fiber CAPS Take 1 capsule by mouth daily.       lamoTRIgine (LAMICTAL) 150 MG tablet TAKE TWO TABLETS BY MOUTH TWICE A DAY 120 tablet 2   meloxicam (MOBIC) 15 MG tablet      Multiple Vitamin (MULTIVITAMIN) tablet Take 1 tablet by mouth daily.       prednisoLONE acetate (PRED FORTE) 1 % ophthalmic suspension Place 1 drop into the left eye at bedtime.      simvastatin (ZOCOR) 20 MG tablet Take 20 mg by mouth daily.     zolpidem (AMBIEN) 10 MG tablet Take 10 mg by mouth at bedtime as needed.       No current facility-administered medications on file prior to visit.    Cardiovascular & other pertient studies:  Echocardiogram w/strain imaging 03/04/2020: 1. Normal GLS -20.9. Left ventricular ejection fraction, by estimation,  is 60 to 65%. The left ventricle has normal function. The left ventricle  has no regional wall motion abnormalities. There is mild left ventricular  hypertrophy. Left ventricular  diastolic parameters are  consistent with Grade II diastolic dysfunction  (pseudonormalization). Elevated left ventricular end-diastolic pressure.  2. Right ventricular systolic function is normal. The right ventricular  size is normal.  3. Left atrial size was moderately dilated.  4. The mitral valve is normal in structure. Mild mitral valve  regurgitation. No evidence of mitral stenosis.  5. The aortic valve is tricuspid. Aortic valve regurgitation is trivial.  Mild aortic valve sclerosis is present, with no evidence of aortic valve  stenosis.  6. The inferior vena cava is normal in size with greater than 50%  respiratory variability, suggesting right atrial pressure of 3 mmHg.   EKG 12/18/2019: Probable sinus rhythm 59 bpm First degree A-V block  Nonspecific ST-T changes  Echocardiogram 11/24/2019:  Normal LV systolic function with visual EF 55-60%. Left ventricle cavity is normal in size. Severe left ventricular hypertrophy. Normal global wall motion. Doppler evidence of grade III (restrictive) diastolic dysfunction, elevated LAP. Calculated EF 54%.  Left atrial cavity is severely dilated.  Grossly right atrial size is dilated.  Mild (Grade I) mitral regurgitation.  Mild tricuspid regurgitation. Mild pulmonary hypertension. RVSP measures 44 mmHg.  Mild pulmonic regurgitation.  IVC is dilated with a respiratory response of <50%.  No prior  study for comparison.  Recent labs: 06/24/2019: Glucose 89, BUN/Cr 16/1.1. EGFR 69. Na/K 145/4.8. Rest of the CMP normal H/H 13/40. MCV 90. Platelets 143 Chol 130, TG 94, HDL 44, LDL 67   Review of Systems  Cardiovascular: Negative for chest pain, dyspnea on exertion, leg swelling, palpitations and syncope.  Neurological: Positive for paresthesias (Lower extremeties).        Vitals:   03/21/20 1333  BP: (!) 150/72  Pulse: 61  Resp: 16  SpO2: 96%     Body mass index is 27.69 kg/m. Filed Weights   03/21/20 1333  Weight: 193 lb (87.5 kg)      Objective:   Physical Exam Vitals and nursing note reviewed.  Constitutional:      General: He is not in acute distress. Neck:     Vascular: No JVD.  Cardiovascular:     Rate and Rhythm: Normal rate and regular rhythm.     Pulses: Intact distal pulses.          Dorsalis pedis pulses are 2+ on the right side and 2+ on the left side.       Posterior tibial pulses are 2+ on the right side and 2+ on the left side.     Heart sounds: Normal heart sounds. No murmur heard.   Pulmonary:     Effort: Pulmonary effort is normal.     Breath sounds: Normal breath sounds. No wheezing or rales.        Assessment & Recommendations:   74 y.o. Caucasian male with hypertension, h/o cancer, peripheral neuropathy, chest pain.  Chest pain: Unlikely angina. Completely resolved at this time.   Abnormal echocardiogram: While patient has grade 2 diastolic dysfunction, with GLS of -20.9, amyloidosis highly unlikely. 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   Hypertension: Uncontrolled. Started losartan 50 mg daily. Check BMP in 1 week. Arranged for remote patient monitoring through pur pharmacist Manuela Schwartz.  F?u in 6 weeks  Maanav Kassabian Esther Hardy, MD [facility] Pager: [phone] Office: [phone]

## 2020-03-31 ENCOUNTER — Ambulatory Visit: Payer: Medicare PPO | Admitting: Neurology

## 2020-04-01 DIAGNOSIS — I10 Essential (primary) hypertension: Secondary | ICD-10-CM | POA: Diagnosis not present

## 2020-04-01 DIAGNOSIS — L820 Inflamed seborrheic keratosis: Secondary | ICD-10-CM | POA: Diagnosis not present

## 2020-04-01 DIAGNOSIS — L570 Actinic keratosis: Secondary | ICD-10-CM | POA: Diagnosis not present

## 2020-04-01 DIAGNOSIS — X32XXXD Exposure to sunlight, subsequent encounter: Secondary | ICD-10-CM | POA: Diagnosis not present

## 2020-04-01 DIAGNOSIS — D225 Melanocytic nevi of trunk: Secondary | ICD-10-CM | POA: Diagnosis not present

## 2020-04-02 LAB — BASIC METABOLIC PANEL
BUN/Creatinine Ratio: 18 (ref 10–24)
BUN: 24 mg/dL (ref 8–27)
CO2: 24 mmol/L (ref 20–29)
Calcium: 9.4 mg/dL (ref 8.6–10.2)
Chloride: 103 mmol/L (ref 96–106)
Creatinine, Ser: 1.3 mg/dL — ABNORMAL HIGH (ref 0.76–1.27)
GFR calc Af Amer: 61 mL/min/{1.73_m2} (ref >59)
GFR calc non Af Amer: 53 mL/min/{1.73_m2} — ABNORMAL LOW (ref >59)
Glucose: 96 mg/dL (ref 65–99)
Potassium: 4.9 mmol/L (ref 3.5–5.2)
Sodium: 140 mmol/L (ref 134–144)

## 2020-04-02 NOTE — Addendum Note (Signed)
Addended by: Nigel Mormon on: 04/02/2020 02:45 PM   Modules accepted: Orders

## 2020-04-02 NOTE — Progress Notes (Signed)
Cr increased from 0.95-->1.3. Encourage increase hydration. Repeat BMP in 7-10 days (Order placed).  Thanks MJP

## 2020-04-04 NOTE — Progress Notes (Signed)
Called and reviewed lab results with pt. Pt reports to only drinking 2-3 glasses/day. Will try to increase to goal of 5-6 glass/day. Recommended pt avoid any NSAIDs or other nephrotoxic medications. Pt to recheck his labs ~04/12/20. Pt reports to be tolerating new start losartan w/o any noted ADRs. Pt verbalized understanding.

## 2020-04-05 DIAGNOSIS — I10 Essential (primary) hypertension: Secondary | ICD-10-CM | POA: Diagnosis not present

## 2020-04-13 DIAGNOSIS — I10 Essential (primary) hypertension: Secondary | ICD-10-CM | POA: Diagnosis not present

## 2020-04-14 LAB — BASIC METABOLIC PANEL
BUN/Creatinine Ratio: 16 (ref 10–24)
BUN: 19 mg/dL (ref 8–27)
CO2: 25 mmol/L (ref 20–29)
Calcium: 9.5 mg/dL (ref 8.6–10.2)
Chloride: 102 mmol/L (ref 96–106)
Creatinine, Ser: 1.21 mg/dL (ref 0.76–1.27)
GFR calc Af Amer: 67 mL/min/{1.73_m2} (ref >59)
GFR calc non Af Amer: 58 mL/min/{1.73_m2} — ABNORMAL LOW (ref >59)
Glucose: 95 mg/dL (ref 65–99)
Potassium: 4.7 mmol/L (ref 3.5–5.2)
Sodium: 140 mmol/L (ref 134–144)

## 2020-04-14 NOTE — Progress Notes (Signed)
Creatinine has improved. Encourage liberal hydration.

## 2020-04-14 NOTE — Progress Notes (Signed)
Spoke to patient, patient is aware  Sch/rma

## 2020-05-02 ENCOUNTER — Encounter: Payer: Self-pay | Admitting: Cardiology

## 2020-05-02 ENCOUNTER — Other Ambulatory Visit: Payer: Self-pay

## 2020-05-02 ENCOUNTER — Ambulatory Visit: Payer: Medicare PPO | Admitting: Cardiology

## 2020-05-02 VITALS — BP 149/75 | HR 61 | Ht 70.0 in | Wt 197.0 lb

## 2020-05-02 DIAGNOSIS — Z23 Encounter for immunization: Secondary | ICD-10-CM | POA: Diagnosis not present

## 2020-05-02 DIAGNOSIS — I10 Essential (primary) hypertension: Secondary | ICD-10-CM | POA: Diagnosis not present

## 2020-05-02 NOTE — Progress Notes (Signed)
Follow up visit  Subjective:   [name], male    DOB: 1946/03/11, 74 y.o.   MRN: [id number]   HPI   Chief Complaint  Patient presents with  . Hypertension  . Follow-up    74 y.o. Caucasian male with hypertension, h/o cancer, peripheral neuropathy, chest pain.  Patient underwent echocardiogram with strain imaging on 03/04/2020, details below.     Home BP monitoring log reviewed. BP stable Avg 141/76  HR 54/min on  Atenolol 75 bid Minoxidil 40 Felodipine 5 Losartan 50  He denies chest pain, shortness of breath, palpitations, leg edema, orthopnea, PND, TIA/syncope.    Current Outpatient Medications on File Prior to Visit  Medication Sig Dispense Refill  . allopurinol (ZYLOPRIM) 100 MG tablet Take 100 mg by mouth daily.      Marland Kitchen atenolol (TENORMIN) 50 MG tablet Take 75 mg by mouth 2 (two) times daily. Take 1.9m BID    . Cholecalciferol (VITAMIN D3) 1000 UNITS tablet Take 1,000 Units by mouth daily.      . clobetasol cream (TEMOVATE) 0.05 % Apply topically.    . felodipine (PLENDIL) 5 MG 24 hr tablet Take 10 mg by mouth daily.     . Fiber CAPS Take 1 capsule by mouth daily.      .Marland KitchenlamoTRIgine (LAMICTAL) 150 MG tablet TAKE TWO TABLETS BY MOUTH TWICE A DAY 120 tablet 2  . losartan (COZAAR) 50 MG tablet Take 1 tablet (50 mg total) by mouth daily. 30 tablet 3  . meloxicam (MOBIC) 15 MG tablet     . minoxidil (LONITEN) 10 MG tablet Take 40 mg by mouth daily.    . Multiple Vitamin (MULTIVITAMIN) tablet Take 1 tablet by mouth daily.      . pantoprazole (PROTONIX) 40 MG tablet     . prednisoLONE acetate (PRED FORTE) 1 % ophthalmic suspension Place 1 drop into the left eye at bedtime.     . simvastatin (ZOCOR) 20 MG tablet Take 20 mg by mouth daily.    .Marland Kitchenzolpidem (AMBIEN) 10 MG tablet Take 10 mg by mouth at bedtime as needed.      .Marland Kitchenaspirin 81 MG tablet Take 81 mg by mouth daily.       No current facility-administered medications on file prior to visit.    Cardiovascular  & other pertient studies:  Echocardiogram w/strain imaging 03/04/2020: 1. Normal GLS -20.9. Left ventricular ejection fraction, by estimation,  is 60 to 65%. The left ventricle has normal function. The left ventricle  has no regional wall motion abnormalities. There is mild left ventricular  hypertrophy. Left ventricular  diastolic parameters are consistent with Grade II diastolic dysfunction  (pseudonormalization). Elevated left ventricular end-diastolic pressure.  2. Right ventricular systolic function is normal. The right ventricular  size is normal.  3. Left atrial size was moderately dilated.  4. The mitral valve is normal in structure. Mild mitral valve  regurgitation. No evidence of mitral stenosis.  5. The aortic valve is tricuspid. Aortic valve regurgitation is trivial.  Mild aortic valve sclerosis is present, with no evidence of aortic valve  stenosis.  6. The inferior vena cava is normal in size with greater than 50%  respiratory variability, suggesting right atrial pressure of 3 mmHg.   EKG 12/18/2019: Probable sinus rhythm 59 bpm First degree A-V block  Nonspecific ST-T changes  Echocardiogram 11/24/2019:  Normal LV systolic function with visual EF 55-60%. Left ventricle cavity is normal in size. Severe left ventricular hypertrophy. Normal global  wall motion. Doppler evidence of grade III (restrictive) diastolic dysfunction, elevated LAP. Calculated EF 54%.  Left atrial cavity is severely dilated.  Grossly right atrial size is dilated.  Mild (Grade I) mitral regurgitation.  Mild tricuspid regurgitation. Mild pulmonary hypertension. RVSP measures 44 mmHg.  Mild pulmonic regurgitation.  IVC is dilated with a respiratory response of <50%.  No prior study for comparison.  Recent labs: 04/13/2020: Glucose 95, BUN/Cr 19/1.21. EGFR 58. Na/K 140/4.7.   04/01/2020: Glucose 96, BUN/Cr 24/1.3. EGFR 53. Na/K 140/4.9.   06/24/2019: Glucose 89, BUN/Cr 16/1.1. EGFR 69. Na/K  145/4.8. Rest of the CMP normal H/H 13/40. MCV 90. Platelets 143 Chol 130, TG 94, HDL 44, LDL 67   Review of Systems  Cardiovascular: Negative for chest pain, dyspnea on exertion, leg swelling, palpitations and syncope.  Neurological: Positive for paresthesias (Lower extremeties).        Vitals:   05/02/20 1437  BP: (!) 149/75  Pulse: 61  SpO2: 96%     Body mass index is 28.27 kg/m. Filed Weights   05/02/20 1437  Weight: 197 lb (89.4 kg)     Objective:   Physical Exam Vitals and nursing note reviewed.  Constitutional:      General: He is not in acute distress. Neck:     Vascular: No JVD.  Cardiovascular:     Rate and Rhythm: Normal rate and regular rhythm.     Pulses: Intact distal pulses.          Dorsalis pedis pulses are 2+ on the right side and 2+ on the left side.       Posterior tibial pulses are 2+ on the right side and 2+ on the left side.     Heart sounds: Normal heart sounds. No murmur heard.   Pulmonary:     Effort: Pulmonary effort is normal.     Breath sounds: Normal breath sounds. No wheezing or rales.        Assessment & Recommendations:   74 y.o. Caucasian male with hypertension, h/o cancer, peripheral neuropathy  Hypertension: Better controlled. Most likely cause for his LVH.  Continue current antihypertensive therapy. Encouraged increased hydration, low salt diet, and regular exercise. If SBP remains >140 mmHg, could increase losartan to 100 mg daily, with close monitoring of Cr and K.   F/u in [place], MD [facility] Pager: [phone] Office: [phone]

## 2020-05-05 ENCOUNTER — Other Ambulatory Visit: Payer: Self-pay | Admitting: Neurology

## 2020-05-05 DIAGNOSIS — I10 Essential (primary) hypertension: Secondary | ICD-10-CM | POA: Diagnosis not present

## 2020-05-16 DIAGNOSIS — M25511 Pain in right shoulder: Secondary | ICD-10-CM | POA: Diagnosis not present

## 2020-05-16 DIAGNOSIS — G8929 Other chronic pain: Secondary | ICD-10-CM | POA: Diagnosis not present

## 2020-05-16 DIAGNOSIS — M7541 Impingement syndrome of right shoulder: Secondary | ICD-10-CM | POA: Diagnosis not present

## 2020-05-21 ENCOUNTER — Other Ambulatory Visit: Payer: Self-pay | Admitting: Cardiology

## 2020-05-21 DIAGNOSIS — I10 Essential (primary) hypertension: Secondary | ICD-10-CM

## 2020-06-05 DIAGNOSIS — I10 Essential (primary) hypertension: Secondary | ICD-10-CM | POA: Diagnosis not present

## 2020-06-08 DIAGNOSIS — H0100A Unspecified blepharitis right eye, upper and lower eyelids: Secondary | ICD-10-CM | POA: Diagnosis not present

## 2020-06-08 DIAGNOSIS — H2511 Age-related nuclear cataract, right eye: Secondary | ICD-10-CM | POA: Diagnosis not present

## 2020-06-08 DIAGNOSIS — H18603 Keratoconus, unspecified, bilateral: Secondary | ICD-10-CM | POA: Diagnosis not present

## 2020-06-08 DIAGNOSIS — Z961 Presence of intraocular lens: Secondary | ICD-10-CM | POA: Diagnosis not present

## 2020-06-08 DIAGNOSIS — Z947 Corneal transplant status: Secondary | ICD-10-CM | POA: Diagnosis not present

## 2020-06-08 DIAGNOSIS — Z8669 Personal history of other diseases of the nervous system and sense organs: Secondary | ICD-10-CM | POA: Diagnosis not present

## 2020-06-08 DIAGNOSIS — H0100B Unspecified blepharitis left eye, upper and lower eyelids: Secondary | ICD-10-CM | POA: Diagnosis not present

## 2020-06-08 DIAGNOSIS — H04123 Dry eye syndrome of bilateral lacrimal glands: Secondary | ICD-10-CM | POA: Diagnosis not present

## 2020-06-13 DIAGNOSIS — M7541 Impingement syndrome of right shoulder: Secondary | ICD-10-CM | POA: Diagnosis not present

## 2020-06-29 DIAGNOSIS — E7800 Pure hypercholesterolemia, unspecified: Secondary | ICD-10-CM | POA: Diagnosis not present

## 2020-06-29 DIAGNOSIS — Z125 Encounter for screening for malignant neoplasm of prostate: Secondary | ICD-10-CM | POA: Diagnosis not present

## 2020-06-29 DIAGNOSIS — I10 Essential (primary) hypertension: Secondary | ICD-10-CM | POA: Diagnosis not present

## 2020-07-04 DIAGNOSIS — G629 Polyneuropathy, unspecified: Secondary | ICD-10-CM | POA: Diagnosis not present

## 2020-07-04 DIAGNOSIS — I10 Essential (primary) hypertension: Secondary | ICD-10-CM | POA: Diagnosis not present

## 2020-07-04 DIAGNOSIS — L309 Dermatitis, unspecified: Secondary | ICD-10-CM | POA: Diagnosis not present

## 2020-07-04 DIAGNOSIS — J309 Allergic rhinitis, unspecified: Secondary | ICD-10-CM | POA: Diagnosis not present

## 2020-07-04 DIAGNOSIS — D696 Thrombocytopenia, unspecified: Secondary | ICD-10-CM | POA: Diagnosis not present

## 2020-07-04 DIAGNOSIS — Z7982 Long term (current) use of aspirin: Secondary | ICD-10-CM | POA: Diagnosis not present

## 2020-07-04 DIAGNOSIS — I700 Atherosclerosis of aorta: Secondary | ICD-10-CM | POA: Diagnosis not present

## 2020-07-04 DIAGNOSIS — J387 Other diseases of larynx: Secondary | ICD-10-CM | POA: Diagnosis not present

## 2020-07-04 DIAGNOSIS — Z0000 Encounter for general adult medical examination without abnormal findings: Secondary | ICD-10-CM | POA: Diagnosis not present

## 2020-07-05 DIAGNOSIS — I10 Essential (primary) hypertension: Secondary | ICD-10-CM | POA: Diagnosis not present

## 2020-07-27 DIAGNOSIS — Z20822 Contact with and (suspected) exposure to covid-19: Secondary | ICD-10-CM | POA: Diagnosis not present

## 2020-08-05 DIAGNOSIS — I10 Essential (primary) hypertension: Secondary | ICD-10-CM | POA: Diagnosis not present

## 2020-08-29 DIAGNOSIS — G4733 Obstructive sleep apnea (adult) (pediatric): Secondary | ICD-10-CM | POA: Diagnosis not present

## 2020-08-29 DIAGNOSIS — J471 Bronchiectasis with (acute) exacerbation: Secondary | ICD-10-CM | POA: Diagnosis not present

## 2020-09-02 DIAGNOSIS — M7541 Impingement syndrome of right shoulder: Secondary | ICD-10-CM | POA: Diagnosis not present

## 2020-09-05 DIAGNOSIS — I10 Essential (primary) hypertension: Secondary | ICD-10-CM | POA: Diagnosis not present

## 2020-09-21 ENCOUNTER — Other Ambulatory Visit: Payer: Self-pay | Admitting: Neurology

## 2020-10-03 ENCOUNTER — Other Ambulatory Visit: Payer: Self-pay

## 2020-10-03 ENCOUNTER — Ambulatory Visit (INDEPENDENT_AMBULATORY_CARE_PROVIDER_SITE_OTHER): Payer: Medicare PPO | Admitting: Neurology

## 2020-10-03 ENCOUNTER — Encounter: Payer: Self-pay | Admitting: Neurology

## 2020-10-03 VITALS — BP 172/76 | HR 58 | Ht 70.0 in | Wt 189.5 lb

## 2020-10-03 DIAGNOSIS — I10 Essential (primary) hypertension: Secondary | ICD-10-CM | POA: Diagnosis not present

## 2020-10-03 DIAGNOSIS — G629 Polyneuropathy, unspecified: Secondary | ICD-10-CM | POA: Diagnosis not present

## 2020-10-03 DIAGNOSIS — G250 Essential tremor: Secondary | ICD-10-CM

## 2020-10-03 DIAGNOSIS — G4733 Obstructive sleep apnea (adult) (pediatric): Secondary | ICD-10-CM

## 2020-10-03 MED ORDER — LAMOTRIGINE 150 MG PO TABS
300.0000 mg | ORAL_TABLET | Freq: Two times a day (BID) | ORAL | 5 refills | Status: DC
Start: 2020-10-03 — End: 2021-04-11

## 2020-10-03 NOTE — Progress Notes (Signed)
[facility]  PATIENT: [name] DOB: November 17, 1945  REFERRING CLINICIAN: Deland Pretty HISTORY FROM: Patient REASON FOR VISIT: Polyneuropathy and tremors   HISTORICAL  CHIEF COMPLAINT:  Chief Complaint  Patient presents with  . Follow-up    RM 12,alone. Last seen 10/01/19. No new sx. Feels about the same as last visit.     HISTORY OF PRESENT ILLNESS:  [name] is a 75 y.o. 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  Update 09/30/20: He feels his polyneuropathy is stable.    Lamotrigine helps the dysesthesias.    Neurontin and a tricyclic antidepressant did not help the pain. Blood work including B12, cryoglobulins and SPEP/IEF had been essentially normal in the past, last tested in 2013. Nerve conduction study in the past was mildly abnormal but more recent one showed good functioning of the large fibers.  He sometimes notes dry eyes.      Tremors are slightly worse.    He notes it left > right hand.   He is right handed and this does not bother him much.    He is on atenolol 75 mg po bid.   Primidone was not well tolerated.    We went over options.   Tremor is not bad enough to try a benzo or to refer for DBS.      His gait is the same with a mild reduced balance..   He has had no further episode of presyncope.  He tries to stay active and walks some daily.    He tries to walk 1/4 mile a couple tmes a day but spine hurts more with longer distance.    He is on CPAP and tolerating well.    REVIEW OF SYSTEMS:  Constitutional: No fevers, chills, sweats, or change in appetite Eyes: No visual changes, double vision, eye pain Ear, nose and throat: No hearing loss, ear pain, nasal congestion, sore throat Cardiovascular: No chest pain, palpitations Respiratory:  No shortness of breath at rest or with exertion.   No wheezes   He has OSA and is on CPAP GastrointestinaI: No nausea, vomiting, diarrhea, abdominal pain, fecal  incontinence Genitourinary:  No dysuria, urinary retention or frequency.  No nocturia. Musculoskeletal:  No neck pain, back pain Integumentary: No rash, pruritus, skin lesions Neurological: as above Psychiatric: No depression at this time.  No anxiety Endocrine: No palpitations, diaphoresis, change in appetite, change in weigh or increased thirst Hematologic/Lymphatic:  No anemia, purpura, petechiae. Allergic/Immunologic: No itchy/runny eyes, nasal congestion, recent allergic reactions, rashes  ALLERGIES: Allergies  Allergen Reactions  . Codeine      nausea  . Doxycycline     Hives and itching  . Penicillins     Swelling, rash  . Sulfa Antibiotics     HOME MEDICATIONS: Outpatient Medications Prior to Visit  Medication Sig Dispense Refill  . allopurinol (ZYLOPRIM) 100 MG tablet Take 150 mg by mouth daily.    Marland Kitchen aspirin 81 MG tablet Take 81 mg by mouth daily.    Marland Kitchen atenolol (TENORMIN) 50 MG tablet Take 75 mg by mouth 2 (two) times daily. Take 1.64m BID    . Cholecalciferol (VITAMIN D3) 1000 UNITS tablet Take 1,000 Units by mouth daily.    . clobetasol cream (TEMOVATE) 0.05 % Apply topically.    . felodipine (PLENDIL) 5 MG 24 hr tablet Take 10 mg by mouth daily.    . Fiber CAPS Take 1 capsule by mouth daily.    .Marland Kitchenlosartan (COZAAR) 50  MG tablet TAKE ONE TABLET BY MOUTH DAILY 30 tablet 6  . meloxicam (MOBIC) 15 MG tablet     . minoxidil (LONITEN) 10 MG tablet Take 40 mg by mouth daily.    . Multiple Vitamin (MULTIVITAMIN) tablet Take 1 tablet by mouth daily.    . pantoprazole (PROTONIX) 40 MG tablet     . prednisoLONE acetate (PRED FORTE) 1 % ophthalmic suspension Place 1 drop into the left eye at bedtime.     . simvastatin (ZOCOR) 20 MG tablet Take 20 mg by mouth daily.    Marland Kitchen zolpidem (AMBIEN) 10 MG tablet Take 10 mg by mouth at bedtime as needed.    . lamoTRIgine (LAMICTAL) 150 MG tablet TAKE TWO TABLETS BY MOUTH TWICE A DAY 120 tablet 5   No facility-administered medications  prior to visit.    PAST MEDICAL HISTORY: Past Medical History:  Diagnosis Date  . Cancer ([place])   . Elevated diaphragm   . Gout   . History of prostate cancer Feb 1993  . Hyperlipidemia   . Hypertension   . Hypoxemia   . Normocytic anemia   . OSA (obstructive sleep apnea)   . Peripheral neuropathy   . Pneumonia   . Vision abnormalities     PAST SURGICAL HISTORY: Past Surgical History:  Procedure Laterality Date  . APPENDECTOMY    . PROSTATECTOMY    . SHOULDER SURGERY     bone spur removed from right    FAMILY HISTORY: Family History  Problem Relation Age of Onset  . Emphysema Father   . Congestive Heart Failure Mother   . Emphysema Brother   . Asthma Daughter     SOCIAL HISTORY:  Social History   Socioeconomic History  . Marital status: Married    Spouse name: Not on file  . Number of children: 2  . Years of education: Not on file  . Highest education level: Not on file  Occupational History  . Occupation: Retired    Comment: Company secretary  Tobacco Use  . Smoking status: Never Smoker  . Smokeless tobacco: Never Used  Vaping Use  . Vaping Use: Never used  Substance and Sexual Activity  . Alcohol use: Not Currently  . Drug use: No  . Sexual activity: Not Currently  Other Topics Concern  . Not on file  Social History Narrative  . Not on file   Social Determinants of Health   Financial Resource Strain: Not on file  Food Insecurity: Not on file  Transportation Needs: Not on file  Physical Activity: Not on file  Stress: Not on file  Social Connections: Not on file  Intimate Partner Violence: Not on file     PHYSICAL EXAM  Vitals:   10/03/20 1259  BP: (!) 172/76  Pulse: (!) 58  Weight: 189 lb 8 oz (86 kg)  Height: '5\' 10"'  (1.778 m)    Body mass index is 27.19 kg/m.   General: The patient is well-developed and well-nourished and in no acute distress.        Neurologic Exam  Mental status: The patient is alert and oriented x 3 at the  time of the examination. The patient has apparent normal recent and remote memory, with an apparently normal attention span and concentration ability.   Speech is normal.  Cranial nerves: Extraocular movements are full.  Facial strength and sensation is normal.. The left pupil is irregular (old) trapezius and sternocleidomastoid strength is normal. No dysarthria is noted.  Hearing is symmetric.  Motor:  Muscle bulk is reduecd in the calves.   Muscle tone is normal. Strength is  5 / 5 in all 4 extremities.     Sensory: Sensation is intact to temperature, touch and vibration in the arms and hands.  Vibration sensation is reduced to 10% at the ankles and absent at the toes.. Touch was just slightly reduced in feet.     Coordination: He has good finger-nose-finger and heel-to-shin.  Gait and station: Station is stable with the eyes open.  His gait is fairly normal for age.  Tandem gait is mildly wide.  He has a couple steps of retropulsion.  Romberg is borderline.  Reflexes: Deep tendon reflexes are increased at the knees.    DIAGNOSTIC DATA (LABS, IMAGING, TESTING) - I reviewed patient records, labs, notes, testing and imaging myself where available.  Lab Results  Component Value Date   WBC 7.4 07/18/2016   HGB 15.4 07/18/2016   HCT 45.4 07/18/2016   MCV 91.0 07/18/2016   PLT 183.0 07/18/2016      Component Value Date/Time   NA 140 04/13/2020 1104   K 4.7 04/13/2020 1104   CL 102 04/13/2020 1104   CO2 25 04/13/2020 1104   GLUCOSE 95 04/13/2020 1104   GLUCOSE 133 (H) 01/24/2015 1540   BUN 19 04/13/2020 1104   CREATININE 1.21 04/13/2020 1104   CALCIUM 9.5 04/13/2020 1104   PROT 7.6 01/24/2015 1540   ALBUMIN 4.0 01/24/2015 1540   AST 18 01/24/2015 1540   ALT 12 (L) 01/24/2015 1540   ALKPHOS 98 01/24/2015 1540   BILITOT 0.5 01/24/2015 1540   GFRNONAA 58 (L) 04/13/2020 1104   GFRAA 67 04/13/2020 1104      ASSESSMENT AND PLAN  Polyneuropathy - Plan: Multiple Myeloma Panel  (SPEP&IFE w/QIG), Sjogren's syndrome antibods(ssa + ssb)  Essential hypertension  OSA (obstructive sleep apnea)  Essential tremor   1.    Continue atenolol for tremor.    2.    Continue lamortigine for dysesthesias.   3.   Check SPEP/IEF (last checked > 5 years ago).  Also check SSA/SSB.    RTC 12 months, sooner if problems  Richard A. Felecia Shelling, MD, PhD 1/50/5697, 9:48 PM Certified in Neurology, Clinical Neurophysiology, Sleep Medicine, Pain Medicine and Neuroimaging  [facility] [address], [place], [place] [address] [phone]

## 2020-10-06 DIAGNOSIS — I10 Essential (primary) hypertension: Secondary | ICD-10-CM | POA: Diagnosis not present

## 2020-10-06 LAB — MULTIPLE MYELOMA PANEL, SERUM
Albumin SerPl Elph-Mcnc: 4.4 g/dL (ref 2.9–4.4)
Albumin/Glob SerPl: 1.9 — ABNORMAL HIGH (ref 0.7–1.7)
Alpha 1: 0.3 g/dL (ref 0.0–0.4)
Alpha2 Glob SerPl Elph-Mcnc: 0.7 g/dL (ref 0.4–1.0)
B-Globulin SerPl Elph-Mcnc: 0.9 g/dL (ref 0.7–1.3)
Gamma Glob SerPl Elph-Mcnc: 0.6 g/dL (ref 0.4–1.8)
Globulin, Total: 2.4 g/dL (ref 2.2–3.9)
IgA/Immunoglobulin A, Serum: 128 mg/dL (ref 61–437)
IgG (Immunoglobin G), Serum: 615 mg/dL (ref 603–1613)
IgM (Immunoglobulin M), Srm: 60 mg/dL (ref 15–143)
Total Protein: 6.8 g/dL (ref 6.0–8.5)

## 2020-10-06 LAB — SJOGREN'S SYNDROME ANTIBODS(SSA + SSB)
ENA SSA (RO) Ab: <0.2 AI (ref 0.0–0.9)
ENA SSB (LA) Ab: <0.2 AI (ref 0.0–0.9)

## 2020-10-31 ENCOUNTER — Encounter: Payer: Self-pay | Admitting: Cardiology

## 2020-10-31 ENCOUNTER — Ambulatory Visit: Payer: Medicare PPO | Admitting: Cardiology

## 2020-10-31 ENCOUNTER — Other Ambulatory Visit: Payer: Self-pay

## 2020-10-31 VITALS — BP 180/83 | HR 57 | Temp 97.9°F | Resp 16 | Ht 70.0 in | Wt 199.0 lb

## 2020-10-31 DIAGNOSIS — I10 Essential (primary) hypertension: Secondary | ICD-10-CM | POA: Diagnosis not present

## 2020-10-31 MED ORDER — LOSARTAN POTASSIUM 100 MG PO TABS: 100.0000 mg | ORAL_TABLET | Freq: Every day | ORAL | 2 refills | Status: DC

## 2020-10-31 NOTE — Progress Notes (Signed)
Follow up visit  Subjective:   [name], male    DOB: [birth year], 75 y.o.   MRN: [id number]   HPI   Chief Complaint  Patient presents with  . Hypertension  . Chest Pain  . Follow-up    6 month    75 y.o. Caucasian male with hypertension, h/o cancer, peripheral neuropathy, chest pain.  Patient is doing well, denies chest pain, shortness of breath, palpitations, leg edema, orthopnea, PND, TIA/syncope. Blood pressure remains elevated.    Current Outpatient Medications on File Prior to Visit  Medication Sig Dispense Refill  . allopurinol (ZYLOPRIM) 100 MG tablet Take 150 mg by mouth daily.    Marland Kitchen aspirin 81 MG tablet Take 81 mg by mouth daily.    Marland Kitchen atenolol (TENORMIN) 50 MG tablet Take 75 mg by mouth 2 (two) times daily. Take 1.82m BID    . Cholecalciferol (VITAMIN D3) 1000 UNITS tablet Take 1,000 Units by mouth daily.    . clobetasol cream (TEMOVATE) 0.05 % Apply topically.    . felodipine (PLENDIL) 5 MG 24 hr tablet Take 10 mg by mouth daily.    . Fiber CAPS Take 1 capsule by mouth daily.    .Marland KitchenlamoTRIgine (LAMICTAL) 150 MG tablet Take 2 tablets (300 mg total) by mouth 2 (two) times daily. 120 tablet 5  . losartan (COZAAR) 50 MG tablet TAKE ONE TABLET BY MOUTH DAILY 30 tablet 6  . meloxicam (MOBIC) 15 MG tablet     . minoxidil (LONITEN) 10 MG tablet Take 40 mg by mouth daily.    . Multiple Vitamin (MULTIVITAMIN) tablet Take 1 tablet by mouth daily.    . pantoprazole (PROTONIX) 40 MG tablet     . prednisoLONE acetate (PRED FORTE) 1 % ophthalmic suspension Place 1 drop into the left eye at bedtime.     . simvastatin (ZOCOR) 20 MG tablet Take 20 mg by mouth daily.    .Marland Kitchenzolpidem (AMBIEN) 10 MG tablet Take 10 mg by mouth at bedtime as needed.     No current facility-administered medications on file prior to visit.    Cardiovascular & other pertient studies:  EKG 10/31/2020: Probable sinus rhythm 57 bopm First degree A-V block  Nonspecific ST-T  changes  Echocardiogram w/strain imaging 03/04/2020: 1. Normal GLS -20.9. Left ventricular ejection fraction, by estimation,  is 60 to 65%. The left ventricle has normal function. The left ventricle  has no regional wall motion abnormalities. There is mild left ventricular  hypertrophy. Left ventricular  diastolic parameters are consistent with Grade II diastolic dysfunction  (pseudonormalization). Elevated left ventricular end-diastolic pressure.  Normal GLS -20.9. 2. Right ventricular systolic function is normal. The right ventricular  size is normal.  3. Left atrial size was moderately dilated.  4. The mitral valve is normal in structure. Mild mitral valve  regurgitation. No evidence of mitral stenosis.  5. The aortic valve is tricuspid. Aortic valve regurgitation is trivial.  Mild aortic valve sclerosis is present, with no evidence of aortic valve  stenosis.  6. The inferior vena cava is normal in size with greater than 50%  respiratory variability, suggesting right atrial pressure of 3 mmHg.   EKG 12/18/2019: Probable sinus rhythm 59 bpm First degree A-V block  Nonspecific ST-T changes  Echocardiogram 11/24/2019:  Normal LV systolic function with visual EF 55-60%. Left ventricle cavity is normal in size. Severe left ventricular hypertrophy. Normal global wall motion. Doppler evidence of grade III (restrictive) diastolic dysfunction, elevated LAP. Calculated EF 54%.  Left atrial cavity is severely dilated.  Grossly right atrial size is dilated.  Mild (Grade I) mitral regurgitation.  Mild tricuspid regurgitation. Mild pulmonary hypertension. RVSP measures 44 mmHg.  Mild pulmonic regurgitation.  IVC is dilated with a respiratory response of <50%.  No prior study for comparison.  Recent labs: 04/13/2020: Glucose 95, BUN/Cr 19/1.21. EGFR 58. Na/K 140/4.7.   04/01/2020: Glucose 96, BUN/Cr 24/1.3. EGFR 53. Na/K 140/4.9.   06/24/2019: Glucose 89, BUN/Cr 16/1.1. EGFR 69. Na/K  145/4.8. Rest of the CMP normal H/H 13/40. MCV 90. Platelets 143 Chol 130, TG 94, HDL 44, LDL 67   Review of Systems  Cardiovascular: Negative for chest pain, dyspnea on exertion, leg swelling, palpitations and syncope.  Neurological: Positive for paresthesias (Lower extremeties).        Vitals:   10/31/20 1335  BP: (!) 180/83  Pulse: (!) 57  Resp: 16  Temp: 97.9 F (36.6 C)  SpO2: 96%     Body mass index is 28.55 kg/m. Filed Weights   10/31/20 1335  Weight: 199 lb (90.3 kg)     Objective:   Physical Exam Vitals and nursing note reviewed.  Constitutional:      General: He is not in acute distress. Neck:     Vascular: No JVD.  Cardiovascular:     Rate and Rhythm: Normal rate and regular rhythm.     Pulses: Intact distal pulses.          Dorsalis pedis pulses are 2+ on the right side and 2+ on the left side.       Posterior tibial pulses are 2+ on the right side and 2+ on the left side.     Heart sounds: Normal heart sounds. No murmur heard.   Pulmonary:     Effort: Pulmonary effort is normal.     Breath sounds: Normal breath sounds. No wheezing or rales.        Assessment & Recommendations:   75 y.o. Caucasian male with hypertension, h/o cancer, peripheral neuropathy, chest pain.  Hypertension: Uncontrolled. Increased losartan to 100 mg daily. Check BMP in 1 week.  Continue rest of the antihypertensive therapy.   F/u in 3 months  [place], MD [facility] Pager: [phone] Office: [phone]

## 2020-11-05 DIAGNOSIS — I10 Essential (primary) hypertension: Secondary | ICD-10-CM | POA: Diagnosis not present

## 2020-11-08 DIAGNOSIS — I10 Essential (primary) hypertension: Secondary | ICD-10-CM | POA: Diagnosis not present

## 2020-11-09 LAB — BASIC METABOLIC PANEL
BUN/Creatinine Ratio: 20 (ref 10–24)
BUN: 22 mg/dL (ref 8–27)
CO2: 24 mmol/L (ref 20–29)
Calcium: 9.6 mg/dL (ref 8.6–10.2)
Chloride: 102 mmol/L (ref 96–106)
Creatinine, Ser: 1.12 mg/dL (ref 0.76–1.27)
Glucose: 68 mg/dL (ref 65–99)
Potassium: 4.8 mmol/L (ref 3.5–5.2)
Sodium: 142 mmol/L (ref 134–144)
eGFR: 68 mL/min/{1.73_m2} (ref >59)

## 2020-11-15 DIAGNOSIS — M4316 Spondylolisthesis, lumbar region: Secondary | ICD-10-CM | POA: Diagnosis not present

## 2020-11-15 DIAGNOSIS — M438X4 Other specified deforming dorsopathies, thoracic region: Secondary | ICD-10-CM | POA: Diagnosis not present

## 2020-11-15 DIAGNOSIS — M47817 Spondylosis without myelopathy or radiculopathy, lumbosacral region: Secondary | ICD-10-CM | POA: Diagnosis not present

## 2020-11-15 DIAGNOSIS — G8929 Other chronic pain: Secondary | ICD-10-CM | POA: Diagnosis not present

## 2020-11-15 DIAGNOSIS — M160 Bilateral primary osteoarthritis of hip: Secondary | ICD-10-CM | POA: Diagnosis not present

## 2020-11-15 DIAGNOSIS — M47814 Spondylosis without myelopathy or radiculopathy, thoracic region: Secondary | ICD-10-CM | POA: Diagnosis not present

## 2020-11-15 DIAGNOSIS — M47816 Spondylosis without myelopathy or radiculopathy, lumbar region: Secondary | ICD-10-CM | POA: Diagnosis not present

## 2020-11-15 DIAGNOSIS — M549 Dorsalgia, unspecified: Secondary | ICD-10-CM | POA: Diagnosis not present

## 2020-11-22 ENCOUNTER — Encounter: Payer: Self-pay | Admitting: Pulmonary Disease

## 2020-11-22 ENCOUNTER — Ambulatory Visit (INDEPENDENT_AMBULATORY_CARE_PROVIDER_SITE_OTHER): Payer: Medicare PPO | Admitting: Pulmonary Disease

## 2020-11-22 ENCOUNTER — Other Ambulatory Visit: Payer: Self-pay

## 2020-11-22 DIAGNOSIS — G4733 Obstructive sleep apnea (adult) (pediatric): Secondary | ICD-10-CM | POA: Diagnosis not present

## 2020-11-22 DIAGNOSIS — J479 Bronchiectasis, uncomplicated: Secondary | ICD-10-CM | POA: Diagnosis not present

## 2020-11-22 NOTE — Progress Notes (Signed)
   Subjective:    Patient ID: [name], male    DOB: [birth year], 75 y.o.   MRN: [id number]  HPI  75 yo for FU of obstructive sleep apneaAnd mild bronchiectasis.   PMH - difficult to control hypertension  He obtained a new machine online in 2018 set at 12 cm Last visit 02/2018  He was lost to follow-up for 3 years Fortunately has not had bronchitic exacerbations. He is compliant with his CPAP machine.  He does not have oxygen during sleep anymore.  CPAP is set at 12 cm.  He uses a full facemask and admits to large leak.  He has occasional dryness of mouth  He is on 3 medications for hypertension including atenolol 75 twice daily, minoxidil and losartan.  He is on ambulatory blood pressure monitoring    Significant tests/ events  PSG 02/2002-mild OSA with AHI 16/h, lowest satn of 70% >> CPAP 9  10/30/2011 SPlit study showed O2 still required -esp during REM sleep  Increased CpAP to 12 cm , Keep 3 L O2 blended in   07/2016 PFTs nml  Review of Systems neg for any significant sore throat, dysphagia, itching, sneezing, nasal congestion or excess/ purulent secretions, fever, chills, sweats, unintended wt loss, pleuritic or exertional cp, hempoptysis, orthopnea pnd or change in chronic leg swelling. Also denies presyncope, palpitations, heartburn, abdominal pain, nausea, vomiting, diarrhea or change in bowel or urinary habits, dysuria,hematuria, rash, arthralgias, visual complaints, headache, numbness weakness or ataxia.     Objective:   Physical Exam  Gen. Pleasant, well-nourished, elderly, in no distress ENT - no thrush, no pallor/icterus,no post nasal drip Neck: No JVD, no thyromegaly, no carotid bruits Lungs: no use of accessory muscles, no dullness to percussion, clear without rales or rhonchi  Cardiovascular: Rhythm regular, heart sounds  normal, no murmurs or gallops, no peripheral edema Musculoskeletal: No deformities, no cyanosis or clubbing         Assessment & Plan:

## 2020-11-22 NOTE — Assessment & Plan Note (Signed)
We discussed that mainstay of therapy is airway clearance and early treatment of bronchitic exacerbation.  We developed a plan for this

## 2020-11-22 NOTE — Patient Instructions (Signed)
Trial of Resmed airfit F 30 full  Face mask CPAP is set at 12 cm & working well

## 2020-11-22 NOTE — Assessment & Plan Note (Signed)
CPAP download was reviewed which shows excellent compliance more than 6 hours every night, good control of events but large leak. We discussed alternative forms of fullface interface, I recommended trial of AirFit F30 fullface mask. He is very compliant and CPAP is only helped improve his daytime somnolence and fatigue

## 2020-11-22 NOTE — Addendum Note (Signed)
Addended by: Merrilee Seashore on: 11/22/2020 12:24 PM   Modules accepted: Orders

## 2020-12-05 DIAGNOSIS — I10 Essential (primary) hypertension: Secondary | ICD-10-CM | POA: Diagnosis not present

## 2020-12-18 ENCOUNTER — Other Ambulatory Visit: Payer: Self-pay | Admitting: Cardiology

## 2020-12-18 DIAGNOSIS — I10 Essential (primary) hypertension: Secondary | ICD-10-CM

## 2021-01-04 DIAGNOSIS — I10 Essential (primary) hypertension: Secondary | ICD-10-CM | POA: Diagnosis not present

## 2021-01-19 DIAGNOSIS — M545 Low back pain, unspecified: Secondary | ICD-10-CM | POA: Diagnosis not present

## 2021-02-01 ENCOUNTER — Other Ambulatory Visit: Payer: Self-pay

## 2021-02-01 ENCOUNTER — Ambulatory Visit: Payer: Medicare PPO | Admitting: Cardiology

## 2021-02-01 ENCOUNTER — Encounter: Payer: Self-pay | Admitting: Cardiology

## 2021-02-01 VITALS — BP 158/72 | HR 58 | Temp 98.6°F | Resp 16 | Ht 71.0 in | Wt 186.6 lb

## 2021-02-01 DIAGNOSIS — I10 Essential (primary) hypertension: Secondary | ICD-10-CM | POA: Diagnosis not present

## 2021-02-01 MED ORDER — LABETALOL HCL 100 MG PO TABS: 100.0000 mg | ORAL_TABLET | Freq: Two times a day (BID) | ORAL | 2 refills | Status: DC

## 2021-02-01 NOTE — Progress Notes (Signed)
Follow up visit  Subjective:   [name], male    DOB: 03/10/1946, 75 y.o.   MRN: [id number]   HPI   Chief Complaint  Patient presents with   Hypertension   Follow-up    75 y.o. Caucasian male with hypertension, h/o cancer, peripheral neuropathy, chest pain.  Patient is doing well, denies chest pain, shortness of breath, palpitations, leg edema, orthopnea, PND, TIA/syncope. Blood pressure remains elevated.    Current Outpatient Medications on File Prior to Visit  Medication Sig Dispense Refill   allopurinol (ZYLOPRIM) 100 MG tablet Take 150 mg by mouth daily.     aspirin 81 MG tablet Take 81 mg by mouth daily.     atenolol (TENORMIN) 50 MG tablet Take 75 mg by mouth 2 (two) times daily. Take 1.61m BID     Cholecalciferol (VITAMIN D3) 1000 UNITS tablet Take 1,000 Units by mouth daily.     clobetasol cream (TEMOVATE) 0.05 % Apply topically.     felodipine (PLENDIL) 5 MG 24 hr tablet Take 10 mg by mouth daily.     Fiber CAPS Take 1 capsule by mouth daily.     lamoTRIgine (LAMICTAL) 150 MG tablet Take 2 tablets (300 mg total) by mouth 2 (two) times daily. 120 tablet 5   losartan (COZAAR) 100 MG tablet Take 1 tablet (100 mg total) by mouth daily. 30 tablet 2   meloxicam (MOBIC) 7.5 MG tablet Take 7.5 mg by mouth daily.     minoxidil (LONITEN) 10 MG tablet Take 40 mg by mouth daily.     Multiple Vitamin (MULTIVITAMIN) tablet Take 1 tablet by mouth daily.     pantoprazole (PROTONIX) 40 MG tablet      prednisoLONE acetate (PRED FORTE) 1 % ophthalmic suspension Place 1 drop into the left eye at bedtime.      simvastatin (ZOCOR) 20 MG tablet Take 20 mg by mouth daily.     zolpidem (AMBIEN) 10 MG tablet Take 10 mg by mouth at bedtime as needed.     No current facility-administered medications on file prior to visit.    Cardiovascular & other pertient studies:  EKG 10/31/2020: Probable sinus rhythm 57 bopm First degree A-V block  Nonspecific ST-T changes  Echocardiogram  w/strain imaging 03/04/2020:  1. Normal GLS -20.9. Left ventricular ejection fraction, by estimation,  is 60 to 65%. The left ventricle has normal function. The left ventricle  has no regional wall motion abnormalities. There is mild left ventricular  hypertrophy. Left ventricular  diastolic parameters are consistent with Grade II diastolic dysfunction  (pseudonormalization). Elevated left ventricular end-diastolic pressure.  Normal GLS -20.9.  2. Right ventricular systolic function is normal. The right ventricular  size is normal.   3. Left atrial size was moderately dilated.   4. The mitral valve is normal in structure. Mild mitral valve  regurgitation. No evidence of mitral stenosis.   5. The aortic valve is tricuspid. Aortic valve regurgitation is trivial.  Mild aortic valve sclerosis is present, with no evidence of aortic valve  stenosis.   6. The inferior vena cava is normal in size with greater than 50%  respiratory variability, suggesting right atrial pressure of 3 mmHg.   Echocardiogram 11/24/2019:  Normal LV systolic function with visual EF 55-60%. Left ventricle cavity is normal in size. Severe left ventricular hypertrophy. Normal global wall motion. Doppler evidence of grade III (restrictive) diastolic dysfunction, elevated LAP. Calculated EF 54%.  Left atrial cavity is severely dilated.  Grossly right atrial  size is dilated.  Mild (Grade I) mitral regurgitation.  Mild tricuspid regurgitation. Mild pulmonary hypertension. RVSP measures 44 mmHg.  Mild pulmonic regurgitation.  IVC is dilated with a respiratory response of <50%.  No prior study for comparison.  Recent labs: 11/08/2020: Glucose 68, BUN/Cr 22/1.12. EGFR 68. Na/K 142/4.8.   06/24/2019: Glucose 89, BUN/Cr 16/1.1. EGFR 69. Na/K 145/4.8. Rest of the CMP normal H/H 13/40. MCV 90. Platelets 143 Chol 130, TG 94, HDL 44, LDL 67   Review of Systems  Cardiovascular:  Negative for chest pain, dyspnea on exertion, leg  swelling, palpitations and syncope.       Vitals:   02/01/21 1316  BP: (!) 158/72  Pulse: (!) 58  Resp: 16  Temp: 98.6 F (37 C)  SpO2: 96%    Body mass index is 26.03 kg/m. Filed Weights   02/01/21 1316  Weight: 186 lb 9.6 oz (84.6 kg)     Objective:   Physical Exam Vitals and nursing note reviewed.  Constitutional:      General: He is not in acute distress. Neck:     Vascular: No JVD.  Cardiovascular:     Rate and Rhythm: Normal rate and regular rhythm.     Heart sounds: Normal heart sounds. No murmur heard. Pulmonary:     Effort: Pulmonary effort is normal.     Breath sounds: Normal breath sounds. No wheezing or rales.  Musculoskeletal:     Right lower leg: No edema.     Left lower leg: No edema.       Assessment & Recommendations:   75 y.o. Caucasian male with hypertension, h/o cancer, peripheral neuropathy, chest pain.  Hypertension: Uncontrolled. Changed atenolol 75 mg bid to labetalol 200 mg bid Continue rest of the antihypertensive therapy.   On absence of known CAD, PAD, stroke, stopped Aspirin.   F/u in 3 months  [place], MD [facility] Pager: [phone] Office: [phone]

## 2021-02-02 ENCOUNTER — Telehealth: Payer: Self-pay

## 2021-02-02 DIAGNOSIS — I10 Essential (primary) hypertension: Secondary | ICD-10-CM | POA: Diagnosis not present

## 2021-02-02 NOTE — Telephone Encounter (Signed)
Patient took labetalol this morning for the first time and felt very dizzy shortly after. Patients BP was 120/63 when this happened. Patient would like to know what he should do about this and would also like to know if he should continue taking this. If so, he wants to know if this is something that will go away or if this will be constant after taking the medication.

## 2021-02-03 NOTE — Telephone Encounter (Signed)
Take 1/2 tab twice a day instead 1 tab twice a day, for now.  Thanks MJP

## 2021-02-03 NOTE — Telephone Encounter (Signed)
Called pt to inform him about the message above and also transferred him to [facility]

## 2021-02-03 NOTE — Telephone Encounter (Signed)
Called and discussed with pt. Pt recently switched from atenolol to labetalol 100 mg BID. Pt confirmed that he stopped atenolol and started labetalol yesterday. Reported feeling lightheaded this morning. Morning BP per remote monitor of 1019/54

## 2021-02-13 ENCOUNTER — Telehealth: Payer: Self-pay | Admitting: Neurology

## 2021-02-13 MED ORDER — PRIMIDONE 50 MG PO TABS: 50.0000 mg | ORAL_TABLET | Freq: Two times a day (BID) | ORAL | 0 refills | Status: DC

## 2021-02-13 NOTE — Telephone Encounter (Signed)
Called the patient and advised that Dr. Felecia Shelling would recommend trying the primidone 50 mg twice a day.  Pt states that he is willing to give this a try again. Informed the patient that if this doesn't work as well as the atenolol then may be worth talking to cardiologist to determine if something else can be done on their end since the atenolol worked for him. Pt verbalized understanding.

## 2021-02-13 NOTE — Addendum Note (Signed)
Addended by: Darleen Crocker on: 02/13/2021 04:52 PM   Modules accepted: Orders

## 2021-02-13 NOTE — Telephone Encounter (Signed)
Pt requesting a call back, has some questions about side affects to his newest medication it looks like the labetalol (NORMODYNE) 100 MG tablet, pt wasn't sure of the name. Please advise.

## 2021-02-13 NOTE — Telephone Encounter (Signed)
Called the pt back. Previously the patient was being treated for his tremors with atenolol 75 mg BID, (had tried primidone previously and didn't tolerate) and when he went to his cardiologist wanted to start him on labetalol for BP concerns and had him stop the atenolol.  Since the patient has dc'd the atenolol over the last week he states his tremors have gotten really bad.  He wanted to discuss if there is something else Dr Felecia Shelling would recommend for the tremors and if there are any side effects he should be concerned about.   Advised I will discuss with Dr Felecia Shelling and advise him of his recommendations after we discuss.

## 2021-02-27 DIAGNOSIS — G4733 Obstructive sleep apnea (adult) (pediatric): Secondary | ICD-10-CM | POA: Diagnosis not present

## 2021-02-27 DIAGNOSIS — J471 Bronchiectasis with (acute) exacerbation: Secondary | ICD-10-CM | POA: Diagnosis not present

## 2021-03-09 ENCOUNTER — Emergency Department (HOSPITAL_BASED_OUTPATIENT_CLINIC_OR_DEPARTMENT_OTHER): Payer: Medicare PPO

## 2021-03-09 ENCOUNTER — Other Ambulatory Visit: Payer: Self-pay

## 2021-03-09 ENCOUNTER — Inpatient Hospital Stay (HOSPITAL_BASED_OUTPATIENT_CLINIC_OR_DEPARTMENT_OTHER)
Admission: EM | Admit: 2021-03-09 | Discharge: 2021-03-20 | DRG: 193 | Disposition: A | Discharge status: Home Health | Payer: Medicare PPO | Attending: Internal Medicine | Admitting: Internal Medicine

## 2021-03-09 ENCOUNTER — Encounter (HOSPITAL_BASED_OUTPATIENT_CLINIC_OR_DEPARTMENT_OTHER): Payer: Self-pay | Admitting: Emergency Medicine

## 2021-03-09 DIAGNOSIS — G4733 Obstructive sleep apnea (adult) (pediatric): Secondary | ICD-10-CM | POA: Diagnosis present

## 2021-03-09 DIAGNOSIS — I10 Essential (primary) hypertension: Secondary | ICD-10-CM | POA: Diagnosis present

## 2021-03-09 DIAGNOSIS — N179 Acute kidney failure, unspecified: Secondary | ICD-10-CM | POA: Diagnosis present

## 2021-03-09 DIAGNOSIS — Z880 Allergy status to penicillin: Secondary | ICD-10-CM

## 2021-03-09 DIAGNOSIS — Z885 Allergy status to narcotic agent status: Secondary | ICD-10-CM | POA: Diagnosis not present

## 2021-03-09 DIAGNOSIS — I959 Hypotension, unspecified: Secondary | ICD-10-CM | POA: Diagnosis present

## 2021-03-09 DIAGNOSIS — G629 Polyneuropathy, unspecified: Secondary | ICD-10-CM | POA: Diagnosis present

## 2021-03-09 DIAGNOSIS — R410 Disorientation, unspecified: Secondary | ICD-10-CM | POA: Diagnosis not present

## 2021-03-09 DIAGNOSIS — Z881 Allergy status to other antibiotic agents status: Secondary | ICD-10-CM

## 2021-03-09 DIAGNOSIS — E876 Hypokalemia: Secondary | ICD-10-CM | POA: Diagnosis not present

## 2021-03-09 DIAGNOSIS — Z8249 Family history of ischemic heart disease and other diseases of the circulatory system: Secondary | ICD-10-CM

## 2021-03-09 DIAGNOSIS — J189 Pneumonia, unspecified organism: Secondary | ICD-10-CM | POA: Diagnosis present

## 2021-03-09 DIAGNOSIS — N1831 Chronic kidney disease, stage 3a: Secondary | ICD-10-CM | POA: Diagnosis present

## 2021-03-09 DIAGNOSIS — J9690 Respiratory failure, unspecified, unspecified whether with hypoxia or hypercapnia: Secondary | ICD-10-CM | POA: Diagnosis not present

## 2021-03-09 DIAGNOSIS — R0902 Hypoxemia: Secondary | ICD-10-CM | POA: Diagnosis not present

## 2021-03-09 DIAGNOSIS — E7800 Pure hypercholesterolemia, unspecified: Secondary | ICD-10-CM | POA: Diagnosis present

## 2021-03-09 DIAGNOSIS — G9341 Metabolic encephalopathy: Secondary | ICD-10-CM | POA: Diagnosis present

## 2021-03-09 DIAGNOSIS — Z791 Long term (current) use of non-steroidal anti-inflammatories (NSAID): Secondary | ICD-10-CM

## 2021-03-09 DIAGNOSIS — J9601 Acute respiratory failure with hypoxia: Secondary | ICD-10-CM | POA: Diagnosis present

## 2021-03-09 DIAGNOSIS — M109 Gout, unspecified: Secondary | ICD-10-CM | POA: Diagnosis present

## 2021-03-09 DIAGNOSIS — Z8673 Personal history of transient ischemic attack (TIA), and cerebral infarction without residual deficits: Secondary | ICD-10-CM | POA: Diagnosis not present

## 2021-03-09 DIAGNOSIS — Z882 Allergy status to sulfonamides status: Secondary | ICD-10-CM | POA: Diagnosis not present

## 2021-03-09 DIAGNOSIS — Z79899 Other long term (current) drug therapy: Secondary | ICD-10-CM | POA: Diagnosis not present

## 2021-03-09 DIAGNOSIS — J471 Bronchiectasis with (acute) exacerbation: Secondary | ICD-10-CM | POA: Diagnosis not present

## 2021-03-09 DIAGNOSIS — T39395A Adverse effect of other nonsteroidal anti-inflammatory drugs [NSAID], initial encounter: Secondary | ICD-10-CM | POA: Diagnosis present

## 2021-03-09 DIAGNOSIS — Z825 Family history of asthma and other chronic lower respiratory diseases: Secondary | ICD-10-CM

## 2021-03-09 DIAGNOSIS — R531 Weakness: Secondary | ICD-10-CM | POA: Diagnosis not present

## 2021-03-09 DIAGNOSIS — Z20822 Contact with and (suspected) exposure to covid-19: Secondary | ICD-10-CM | POA: Diagnosis present

## 2021-03-09 DIAGNOSIS — R0602 Shortness of breath: Secondary | ICD-10-CM

## 2021-03-09 DIAGNOSIS — R5383 Other fatigue: Secondary | ICD-10-CM | POA: Diagnosis not present

## 2021-03-09 DIAGNOSIS — I129 Hypertensive chronic kidney disease with stage 1 through stage 4 chronic kidney disease, or unspecified chronic kidney disease: Secondary | ICD-10-CM | POA: Diagnosis present

## 2021-03-09 DIAGNOSIS — Z8546 Personal history of malignant neoplasm of prostate: Secondary | ICD-10-CM

## 2021-03-09 DIAGNOSIS — R339 Retention of urine, unspecified: Secondary | ICD-10-CM | POA: Diagnosis not present

## 2021-03-09 DIAGNOSIS — R0600 Dyspnea, unspecified: Secondary | ICD-10-CM | POA: Diagnosis not present

## 2021-03-09 DIAGNOSIS — R059 Cough, unspecified: Secondary | ICD-10-CM

## 2021-03-09 DIAGNOSIS — J90 Pleural effusion, not elsewhere classified: Secondary | ICD-10-CM | POA: Diagnosis not present

## 2021-03-09 DIAGNOSIS — R4182 Altered mental status, unspecified: Secondary | ICD-10-CM | POA: Diagnosis not present

## 2021-03-09 DIAGNOSIS — J9811 Atelectasis: Secondary | ICD-10-CM | POA: Diagnosis not present

## 2021-03-09 DIAGNOSIS — J188 Other pneumonia, unspecified organism: Secondary | ICD-10-CM | POA: Diagnosis not present

## 2021-03-09 DIAGNOSIS — R42 Dizziness and giddiness: Secondary | ICD-10-CM | POA: Diagnosis not present

## 2021-03-09 LAB — CBC WITH DIFFERENTIAL/PLATELET
Abs Immature Granulocytes: 0.03 10*3/uL (ref 0.00–0.07)
Basophils Absolute: 0 10*3/uL (ref 0.0–0.1)
Basophils Relative: 0 %
Eosinophils Absolute: 0.1 10*3/uL (ref 0.0–0.5)
Eosinophils Relative: 1 %
HCT: 35.6 % — ABNORMAL LOW (ref 39.0–52.0)
Hemoglobin: 12.2 g/dL — ABNORMAL LOW (ref 13.0–17.0)
Immature Granulocytes: 0 %
Lymphocytes Relative: 8 %
Lymphs Abs: 0.6 10*3/uL — ABNORMAL LOW (ref 0.7–4.0)
MCH: 31.2 pg (ref 26.0–34.0)
MCHC: 34.3 g/dL (ref 30.0–36.0)
MCV: 91 fL (ref 80.0–100.0)
Monocytes Absolute: 0.7 10*3/uL (ref 0.1–1.0)
Monocytes Relative: 9 %
Neutro Abs: 6.3 10*3/uL (ref 1.7–7.7)
Neutrophils Relative %: 82 %
Platelets: 136 10*3/uL — ABNORMAL LOW (ref 150–400)
RBC: 3.91 MIL/uL — ABNORMAL LOW (ref 4.22–5.81)
RDW: 13.8 % (ref 11.5–15.5)
WBC: 7.7 10*3/uL (ref 4.0–10.5)
nRBC: 0 % (ref 0.0–0.2)

## 2021-03-09 LAB — COMPREHENSIVE METABOLIC PANEL
ALT: 11 U/L (ref 0–44)
AST: 16 U/L (ref 15–41)
Albumin: 3.7 g/dL (ref 3.5–5.0)
Alkaline Phosphatase: 81 U/L (ref 38–126)
Anion gap: 8 (ref 5–15)
BUN: 32 mg/dL — ABNORMAL HIGH (ref 8–23)
CO2: 28 mmol/L (ref 22–32)
Calcium: 8.6 mg/dL — ABNORMAL LOW (ref 8.9–10.3)
Chloride: 99 mmol/L (ref 98–111)
Creatinine, Ser: 1.61 mg/dL — ABNORMAL HIGH (ref 0.61–1.24)
GFR, Estimated: 44 mL/min — ABNORMAL LOW (ref >60)
Glucose, Bld: 141 mg/dL — ABNORMAL HIGH (ref 70–99)
Potassium: 4.7 mmol/L (ref 3.5–5.1)
Sodium: 135 mmol/L (ref 135–145)
Total Bilirubin: 0.9 mg/dL (ref 0.3–1.2)
Total Protein: 6.5 g/dL (ref 6.5–8.1)

## 2021-03-09 LAB — LIPASE, BLOOD: Lipase: 24 U/L (ref 11–51)

## 2021-03-09 LAB — TROPONIN I (HIGH SENSITIVITY): Troponin I (High Sensitivity): 15 ng/L (ref <18)

## 2021-03-09 LAB — LACTIC ACID, PLASMA: Lactic Acid, Venous: 1.2 mmol/L (ref 0.5–1.9)

## 2021-03-09 MED ORDER — SODIUM CHLORIDE 0.9 % IV BOLUS
500.0000 mL | Freq: Once | INTRAVENOUS | Status: AC
  Administered 2021-03-09: 500 mL via INTRAVENOUS

## 2021-03-09 MED ORDER — ACETAMINOPHEN 500 MG PO TABS
1000.0000 mg | ORAL_TABLET | Freq: Once | ORAL | Status: AC
Start: 2021-03-10 — End: 2021-03-09
  Administered 2021-03-09: 1000 mg via ORAL
  Filled 2021-03-09: qty 2

## 2021-03-09 NOTE — ED Triage Notes (Signed)
Patient arrived via [facility] c/o generalized weakness/dizziness x 1 day. Patient states weakness x 1 day, wife states confusion x 3 hrs (Patient AO x 4), dizziness x 3 hrs post nap. Patient AO x 4, VS WDL, no gait at this time.

## 2021-03-09 NOTE — ED Provider Notes (Signed)
Emergency Department Provider Note   I have reviewed the triage vital signs and the nursing notes.   HISTORY  Chief Complaint Dizziness and Weakness   HPI [name] is a 75 y.o. male with past medical history reviewed below presents the emergency department with lightheadedness and fatigue.  Symptoms of been present for the past 24 hours.  Per EMS, the wife noticed some confusion over the past 3 hours but patient has not noticed this.  He states that he has been feeling some dizzy type feeling and was walking today when he could not get up.  He did not feel vertigo.  No unilateral weakness or numbness.  No chest pains.  He has not noticed any fever or shaking chills.  No URI symptoms.  No vomiting or diarrhea.  No new medications.  EMS arrived and reported to find him A&O x 4 with largely normal vital other than borderline fever.   The patient's wife arrives a short time later and states that he woke up from a nap after a poor night of sleep.  He was confused about the time of day.  He was asking her why she had not done certain things and woken him up at the appropriate time. No speech change.    Past Medical History:  Diagnosis Date   Cancer ([place])    Elevated diaphragm    Gout    History of prostate cancer Feb 1993   Hyperlipidemia    Hypertension    Hypoxemia    Normocytic anemia    OSA (obstructive sleep apnea)    Peripheral neuropathy    Pneumonia    Vision abnormalities     Patient Active Problem List   Diagnosis Date Noted   Hypotension [id number]   Acute metabolic encephalopathy 0000000   CAP (community acquired pneumonia) 03/11/2021   Acute renal failure superimposed on stage 3a chronic kidney disease ([place]) 03/11/2021   Acute respiratory failure with hypoxia ([place]) [id number]   Diastolic 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 heart failure 12/18/2019   Atypical chest pain 11/19/2019   Near syncope 01/26/2015   Dizziness and giddiness 01/06/2015   Transient cerebral  ischemia 01/06/2015   Hyperreflexia 12/14/2014   Leg weakness 12/14/2014   Bronchiectasis without acute exacerbation ([place]) 10/09/2014   Rib pain on right side 10/09/2014   Blepharitis 09/27/2014   Branch retinal vein occlusion 09/27/2014   Combined form of senile cataract 09/27/2014   NS (nuclear sclerosis) 09/27/2014   Polyneuropathy 09/10/2014   Dysesthesia 09/10/2014   Essential tremor 09/10/2014   Low back pain with sciatica 06/15/2014   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   Hypercholesterolemia 06/15/2014   Degeneration of intervertebral disc of lumbar region 06/15/2014   Lumbar and sacral osteoarthritis 06/15/2014   Eyeball rupture 11/05/2011   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   Age related cataract 10/22/2011   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   OSA (obstructive sleep apnea)    History of prostate cancer    Elevated diaphragm    Normocytic anemia    Essential hypertension    Gout     Past Surgical History:  Procedure Laterality Date   APPENDECTOMY     PROSTATECTOMY     SHOULDER SURGERY     bone spur removed from right    Allergies Azithromycin, Codeine, Doxycycline, Penicillins, and Sulfa antibiotics  Family History  Problem Relation Age of Onset   Emphysema Father    Congestive Heart Failure Mother    Emphysema Brother  Asthma Daughter     Social History Social History   Tobacco Use   Smoking status: Never   Smokeless tobacco: Never  Vaping Use   Vaping Use: Never used  Substance Use Topics   Alcohol use: Not Currently   Drug use: No    Review of Systems  Constitutional: No fever/chills. Positive weakness.  Eyes: No visual changes. ENT: No sore throat. Cardiovascular: Denies chest pain. Positive near syncope.  Respiratory: Denies shortness of breath. Gastrointestinal: No abdominal pain.  No nausea, no vomiting.  No diarrhea.  No constipation. Genitourinary: Negative for dysuria. Musculoskeletal:  Negative for back pain. Skin: Negative for rash. Neurological: Negative for headaches, focal weakness or numbness.  10-point ROS otherwise negative.  ____________________________________________   PHYSICAL EXAM:  VITAL SIGNS: ED Triage Vitals  Enc Vitals Group     BP 03/09/21 2012 (!) 117/52     Pulse Rate 03/09/21 2012 80     Resp 03/09/21 2012 18     Temp 03/09/21 2012 100.3 F (37.9 C)     Temp Source 03/09/21 2012 Oral     SpO2 03/09/21 2006 98 %     Weight 03/09/21 2011 190 lb (86.2 kg)     Height 03/09/21 2011 '5\' 11"'$  (1.803 m)   Constitutional: Alert and oriented x 4. Well appearing and in no acute distress. Eyes: Conjunctivae are normal. PERRL. EOMI. Head: Atraumatic. Nose: No congestion/rhinnorhea. Mouth/Throat: Mucous membranes are slightly dry.  Neck: No stridor.  Cardiovascular: Normal rate, regular rhythm. Good peripheral circulation. Grossly normal heart sounds.   Respiratory: Normal respiratory effort.  No retractions. Lungs CTAB. Gastrointestinal: Soft and nontender. No distention.  Musculoskeletal: No lower extremity tenderness nor edema. No gross deformities of extremities. Neurologic:  Normal speech and language. No gross focal neurologic deficits are appreciated. No facial asymmetry. 5/5 strength in the bilateral upper/lower extremities. No sensory deficit.  Skin:  Skin is warm, dry and intact. No rash noted.   ____________________________________________   LABS (all labs ordered are listed, but only abnormal results are displayed)  Labs Reviewed  URINE CULTURE - Abnormal; Notable for the following components:      Result Value   Culture 10,000 COLONIES/mL ENTEROCOCCUS FAECALIS (*)    Organism ID, Bacteria ENTEROCOCCUS FAECALIS (*)    All other components within normal limits  COMPREHENSIVE METABOLIC PANEL - Abnormal; Notable for the following components:   Glucose, Bld 141 (*)    BUN 32 (*)    Creatinine, Ser 1.61 (*)    Calcium 8.6 (*)     GFR, Estimated 44 (*)    All other components within normal limits  CBC WITH DIFFERENTIAL/PLATELET - Abnormal; Notable for the following components:   RBC 3.91 (*)    Hemoglobin 12.2 (*)    HCT 35.6 (*)    Platelets 136 (*)    Lymphs Abs 0.6 (*)    All other components within normal limits  URINALYSIS, ROUTINE W REFLEX MICROSCOPIC - Abnormal; Notable for the following components:   APPearance HAZY (*)    Bilirubin Urine SMALL (*)    Leukocytes,Ua SMALL (*)    All other components within normal limits  URINALYSIS, MICROSCOPIC (REFLEX) - Abnormal; Notable for the following components:   Bacteria, UA MANY (*)    All other components within normal limits  CBC - Abnormal; Notable for the following components:   RBC 3.95 (*)    Hemoglobin 12.3 (*)    HCT 36.8 (*)    Platelets 142 (*)  All other components within normal limits  COMPREHENSIVE METABOLIC PANEL - Abnormal; Notable for the following components:   Glucose, Bld 150 (*)    BUN 29 (*)    Creatinine, Ser 1.37 (*)    Calcium 8.7 (*)    GFR, Estimated 53 (*)    All other components within normal limits  D-DIMER, QUANTITATIVE - Abnormal; Notable for the following components:   D-Dimer, Quant 2.54 (*)    All other components within normal limits  COMPREHENSIVE METABOLIC PANEL - Abnormal; Notable for the following components:   Glucose, Bld 102 (*)    BUN 28 (*)    Creatinine, Ser 1.28 (*)    Calcium 8.4 (*)    Total Protein 6.3 (*)    Albumin 3.4 (*)    GFR, Estimated 58 (*)    All other components within normal limits  CBC - Abnormal; Notable for the following components:   RBC 3.86 (*)    Hemoglobin 11.8 (*)    HCT 35.9 (*)    Platelets 126 (*)    All other components within normal limits  CBC WITH DIFFERENTIAL/PLATELET - Abnormal; Notable for the following components:   RBC 3.26 (*)    Hemoglobin 10.2 (*)    HCT 30.9 (*)    Platelets 126 (*)    Lymphs Abs 0.5 (*)    All other components within normal limits   BASIC METABOLIC PANEL - Abnormal; Notable for the following components:   Glucose, Bld 107 (*)    BUN 29 (*)    Creatinine, Ser 1.27 (*)    Calcium 7.8 (*)    GFR, Estimated 59 (*)    All other components within normal limits  GLUCOSE, CAPILLARY - Abnormal; Notable for the following components:   Glucose-Capillary 180 (*)    All other components within normal limits  URINALYSIS, ROUTINE W REFLEX MICROSCOPIC - Abnormal; Notable for the following components:   Color, Urine AMBER (*)    APPearance CLOUDY (*)    Specific Gravity, Urine 1.040 (*)    Bilirubin Urine SMALL (*)    Ketones, ur 5 (*)    Protein, ur 30 (*)    Leukocytes,Ua MODERATE (*)    Bacteria, UA RARE (*)    All other components within normal limits  BLOOD GAS, ARTERIAL - Abnormal; Notable for the following components:   pH, Arterial 7.319 (*)    pO2, Arterial 112 (*)    Acid-base deficit 4.7 (*)    All other components within normal limits  CBC WITH DIFFERENTIAL/PLATELET - Abnormal; Notable for the following components:   RBC 3.33 (*)    Hemoglobin 10.3 (*)    HCT 30.6 (*)    Platelets 126 (*)    Lymphs Abs 0.6 (*)    All other components within normal limits  RENAL FUNCTION PANEL - Abnormal; Notable for the following components:   Glucose, Bld 114 (*)    BUN 26 (*)    Calcium 8.3 (*)    Albumin 3.0 (*)    All other components within normal limits  BRAIN NATRIURETIC PEPTIDE - Abnormal; Notable for the following components:   B Natriuretic Peptide 400.0 (*)    All other components within normal limits  CBC - Abnormal; Notable for the following components:   RBC 3.07 (*)    Hemoglobin 9.6 (*)    HCT 28.6 (*)    Platelets 136 (*)    All other components within normal limits  BASIC METABOLIC PANEL - Abnormal;  Notable for the following components:   Glucose, Bld 115 (*)    BUN 31 (*)    Creatinine, Ser 1.33 (*)    Calcium 8.2 (*)    GFR, Estimated 55 (*)    All other components within normal limits   CULTURE, BLOOD (ROUTINE X 2)  CULTURE, BLOOD (ROUTINE X 2)  RESP PANEL BY RT-PCR (FLU A&B, COVID) [id number]  URINE CULTURE  CULTURE, BLOOD (ROUTINE X 2)  CULTURE, BLOOD (ROUTINE X 2)  URINE CULTURE  MRSA NEXT GEN BY PCR, NASAL  LIPASE, BLOOD  LACTIC ACID, PLASMA  LEGIONELLA PNEUMOPHILA SEROGP 1 UR AG  STREP PNEUMONIAE URINARY ANTIGEN  MAGNESIUM  MAGNESIUM  TROPONIN I (HIGH SENSITIVITY)  TROPONIN I (HIGH SENSITIVITY)   ____________________________________________  EKG   EKG Interpretation  Date/Time:  Thursday March 09 2021 20:10:13 EDT Ventricular Rate:  80 PR Interval:  244 QRS Duration: 85 QT Interval:  355 QTC Calculation: 410 R Axis:   45 Text Interpretation: Sinus rhythm Prolonged PR interval Confirmed by Nanda Quinton [phone]) on 03/09/2021 8:18:01 PM        ____________________________________________  RADIOLOGY  CT HEAD WO CONTRAST (5MM)  Result Date: 03/09/2021 CLINICAL DATA:  Mental status change, unknown cause. Dizziness, weakness EXAM: CT HEAD WITHOUT CONTRAST TECHNIQUE: Contiguous axial images were obtained from the base of the skull through the vertex without intravenous contrast. COMPARISON:  MRI 01/22/2015 FINDINGS: Brain: Normal anatomic configuration. Parenchymal volume loss is commensurate with the patient's age. Mild periventricular white matter changes are present likely reflecting the sequela of small vessel ischemia. No abnormal intra or extra-axial mass lesion or fluid collection. No abnormal mass effect or midline shift. No evidence of acute intracranial hemorrhage or infarct. Ventricular size is normal. Cerebellum unremarkable. Vascular: No asymmetric hyperdense vasculature at the skull base. Skull: Intact Sinuses/Orbits: Paranasal sinuses are clear. Orbits are unremarkable. Other: Mastoid air cells and middle ear cavities are clear. IMPRESSION: No acute intracranial abnormality.  Mild senescent change. Electronically Signed   By: Fidela Salisbury MD   On:  03/09/2021 20:46   DG Chest Portable 1 View  Result Date: 03/09/2021 CLINICAL DATA:  Altered level of consciousness, dizziness EXAM: PORTABLE CHEST 1 VIEW COMPARISON:  12/27/2015 FINDINGS: Single frontal view of the chest demonstrates an unremarkable cardiac silhouette. Chronic elevation of the left hemidiaphragm. Lung volumes are diminished, with bibasilar consolidation left greater than right. No effusion or pneumothorax. IMPRESSION: 1. Low lung volumes with bibasilar consolidation, favor atelectasis over airspace disease. Electronically Signed   By: Randa Ngo M.D.   On: 03/09/2021 20:44    ____________________________________________   PROCEDURES  Procedure(s) performed:   Procedures  CRITICAL CARE Performed by: Margette Fast Total critical care time: 35 minutes Critical care time was exclusive of separately billable procedures and treating other patients. Critical care was necessary to treat or prevent imminent or life-threatening deterioration. Critical care was time spent personally by me on the following activities: development of treatment plan with patient and/or surrogate as well as nursing, discussions with consultants, evaluation of patient's response to treatment, examination of patient, obtaining history from patient or surrogate, ordering and performing treatments and interventions, ordering and review of laboratory studies, ordering and review of radiographic studies, pulse oximetry and re-evaluation of patient's condition.  Nanda Quinton, MD Emergency Medicine  ____________________________________________   INITIAL IMPRESSION / ASSESSMENT AND PLAN / ED COURSE  Pertinent labs & imaging results that were available during my care of the patient were reviewed by me and considered in  my medical decision making (see chart for details).   Patient presents emergency department with complaint of feeling lightheaded/dizzy along with some weakness.  He arrives with borderline  fever.  He has some mild hypoxemia with O2 sats in the high 80s at times.  He does seem to have some mild increased work of breathing.  Suspect a possibly developing respiratory infection.  The wife's description of confusion does not sound consistent with stroke or postictal phase.  He has no focal neurologic deficits.   CT imaging of the head reviewed with no acute findings.  Chest x-ray favoring atelectasis but infiltrate is a consideration. Waiting on COVID/Flu results and will reassess.   Patient now requiring O2 and has developed fever. UA pending. Care transferred to Dr. Leonette Monarch.  ____________________________________________  FINAL CLINICAL IMPRESSION(S) / ED DIAGNOSES  Final diagnoses:  AKI (acute kidney injury) ([place])  SOB (shortness of breath)     MEDICATIONS GIVEN DURING THIS VISIT:  Medications  acetaminophen (TYLENOL) tablet 650 mg (650 mg Oral Given 03/13/21 2135)  minoxidil (LONITEN) tablet 40 mg (40 mg Oral Given 03/13/21 0929)  simvastatin (ZOCOR) tablet 20 mg (20 mg Oral Given 03/13/21 0930)  lamoTRIgine (LAMICTAL) tablet 300 mg (300 mg Oral Given 03/13/21 2135)  primidone (MYSOLINE) tablet 50 mg (50 mg Oral Given 03/13/21 2135)  prednisoLONE acetate (PRED FORTE) 1 % ophthalmic suspension 1 drop (1 drop Left Eye Given 03/13/21 2135)  enoxaparin (LOVENOX) injection 40 mg (40 mg Subcutaneous Given 03/13/21 1334)  ondansetron (ZOFRAN) tablet 4 mg ( Oral See Alternative 03/12/21 1750)    Or  ondansetron (ZOFRAN) injection 4 mg (4 mg Intravenous Given 03/12/21 1750)  albuterol (PROVENTIL) (2.5 MG/3ML) 0.083% nebulizer solution 2.5 mg (2.5 mg Nebulization Given 03/12/21 1435)  cefTRIAXone (ROCEPHIN) 2 g in sodium chloride 0.9 % 100 mL IVPB (0 g Intravenous Stopped 03/13/21 1030)  guaiFENesin (MUCINEX) 12 hr tablet 1,200 mg (1,200 mg Oral Given 03/13/21 2135)  loratadine (CLARITIN) tablet 10 mg (10 mg Oral Given 03/13/21 0930)  pantoprazole (PROTONIX) EC tablet 40 mg (40 mg Oral Given 03/14/21 0450)   fluticasone (FLONASE) 50 MCG/ACT nasal spray 2 spray (2 sprays Each Nare Given 03/13/21 0931)  vancomycin (VANCOREADY) IVPB 1500 mg/300 mL (0 mg Intravenous Stopped 03/14/21 0451)  ibuprofen (ADVIL) tablet 400 mg (400 mg Oral Given 03/11/21 2010)  ipratropium-albuterol (DUONEB) 0.5-2.5 (3) MG/3ML nebulizer solution 3 mL (3 mLs Nebulization Given 03/14/21 0810)  guaiFENesin-dextromethorphan (ROBITUSSIN DM) 100-10 MG/5ML syrup 5 mL (5 mLs Oral Given 03/12/21 1711)  prazosin (MINIPRESS) capsule 1 mg (1 mg Oral Given 03/13/21 2135)  polycarbophil (FIBERCON) tablet 625 mg (has no administration in time range)  sodium chloride 0.9 % bolus 500 mL ( Intravenous Stopped 03/09/21 2329)  acetaminophen (TYLENOL) tablet 1,000 mg (1,000 mg Oral Given 03/09/21 2357)  cefTRIAXone (ROCEPHIN) 1 g in sodium chloride 0.9 % 100 mL IVPB (0 g Intravenous Stopped 03/10/21 0155)  acetaminophen (TYLENOL) tablet 650 mg (650 mg Oral Given 03/10/21 0842)  iohexol (OMNIPAQUE) 350 MG/ML injection 75 mL (75 mLs Intravenous Contrast Given 03/11/21 1006)  vancomycin (VANCOREADY) IVPB 1750 mg/350 mL (1,750 mg Intravenous New Bag/Given 03/11/21 2013)  sodium chloride 0.9 % bolus 1,000 mL (0 mLs Intravenous Stopped 03/12/21 1200)  morphine 2 MG/ML injection 0.5 mg (0.5 mg Intravenous Given 03/12/21 1756)  furosemide (LASIX) injection 20 mg (20 mg Intravenous Given 03/13/21 1417)     Note:  This document was prepared using Dragon voice recognition software and may include unintentional dictation errors.  Nanda Quinton, MD, [facility] Emergency Medicine    Jamilet Ambroise, [facility], MD 03/14/21 [phone]

## 2021-03-09 NOTE — ED Notes (Signed)
Pt has a urinal at bedside 

## 2021-03-10 ENCOUNTER — Inpatient Hospital Stay (HOSPITAL_COMMUNITY): Payer: Medicare PPO

## 2021-03-10 DIAGNOSIS — G9341 Metabolic encephalopathy: Secondary | ICD-10-CM | POA: Diagnosis present

## 2021-03-10 DIAGNOSIS — Z881 Allergy status to other antibiotic agents status: Secondary | ICD-10-CM | POA: Diagnosis not present

## 2021-03-10 DIAGNOSIS — Z880 Allergy status to penicillin: Secondary | ICD-10-CM | POA: Diagnosis not present

## 2021-03-10 DIAGNOSIS — T39395A Adverse effect of other nonsteroidal anti-inflammatory drugs [NSAID], initial encounter: Secondary | ICD-10-CM | POA: Diagnosis present

## 2021-03-10 DIAGNOSIS — Z8546 Personal history of malignant neoplasm of prostate: Secondary | ICD-10-CM | POA: Diagnosis not present

## 2021-03-10 DIAGNOSIS — Z79899 Other long term (current) drug therapy: Secondary | ICD-10-CM | POA: Diagnosis not present

## 2021-03-10 DIAGNOSIS — I129 Hypertensive chronic kidney disease with stage 1 through stage 4 chronic kidney disease, or unspecified chronic kidney disease: Secondary | ICD-10-CM | POA: Diagnosis present

## 2021-03-10 DIAGNOSIS — E7800 Pure hypercholesterolemia, unspecified: Secondary | ICD-10-CM | POA: Diagnosis present

## 2021-03-10 DIAGNOSIS — J9601 Acute respiratory failure with hypoxia: Secondary | ICD-10-CM | POA: Diagnosis present

## 2021-03-10 DIAGNOSIS — N1831 Chronic kidney disease, stage 3a: Secondary | ICD-10-CM | POA: Diagnosis present

## 2021-03-10 DIAGNOSIS — Z791 Long term (current) use of non-steroidal anti-inflammatories (NSAID): Secondary | ICD-10-CM | POA: Diagnosis not present

## 2021-03-10 DIAGNOSIS — N179 Acute kidney failure, unspecified: Secondary | ICD-10-CM | POA: Diagnosis present

## 2021-03-10 DIAGNOSIS — Z825 Family history of asthma and other chronic lower respiratory diseases: Secondary | ICD-10-CM | POA: Diagnosis not present

## 2021-03-10 DIAGNOSIS — G629 Polyneuropathy, unspecified: Secondary | ICD-10-CM | POA: Diagnosis present

## 2021-03-10 DIAGNOSIS — Z885 Allergy status to narcotic agent status: Secondary | ICD-10-CM | POA: Diagnosis not present

## 2021-03-10 DIAGNOSIS — R0602 Shortness of breath: Secondary | ICD-10-CM | POA: Diagnosis present

## 2021-03-10 DIAGNOSIS — I10 Essential (primary) hypertension: Secondary | ICD-10-CM | POA: Diagnosis not present

## 2021-03-10 DIAGNOSIS — M109 Gout, unspecified: Secondary | ICD-10-CM | POA: Diagnosis present

## 2021-03-10 DIAGNOSIS — J189 Pneumonia, unspecified organism: Secondary | ICD-10-CM | POA: Diagnosis present

## 2021-03-10 DIAGNOSIS — Z8673 Personal history of transient ischemic attack (TIA), and cerebral infarction without residual deficits: Secondary | ICD-10-CM | POA: Diagnosis not present

## 2021-03-10 DIAGNOSIS — I959 Hypotension, unspecified: Secondary | ICD-10-CM | POA: Diagnosis present

## 2021-03-10 DIAGNOSIS — Z8249 Family history of ischemic heart disease and other diseases of the circulatory system: Secondary | ICD-10-CM | POA: Diagnosis not present

## 2021-03-10 DIAGNOSIS — E876 Hypokalemia: Secondary | ICD-10-CM | POA: Diagnosis not present

## 2021-03-10 DIAGNOSIS — Z20822 Contact with and (suspected) exposure to covid-19: Secondary | ICD-10-CM | POA: Diagnosis present

## 2021-03-10 DIAGNOSIS — G4733 Obstructive sleep apnea (adult) (pediatric): Secondary | ICD-10-CM | POA: Diagnosis present

## 2021-03-10 DIAGNOSIS — Z882 Allergy status to sulfonamides status: Secondary | ICD-10-CM | POA: Diagnosis not present

## 2021-03-10 LAB — COMPREHENSIVE METABOLIC PANEL
ALT: 13 U/L (ref 0–44)
AST: 17 U/L (ref 15–41)
Albumin: 3.7 g/dL (ref 3.5–5.0)
Alkaline Phosphatase: 88 U/L (ref 38–126)
Anion gap: 8 (ref 5–15)
BUN: 29 mg/dL — ABNORMAL HIGH (ref 8–23)
CO2: 26 mmol/L (ref 22–32)
Calcium: 8.7 mg/dL — ABNORMAL LOW (ref 8.9–10.3)
Chloride: 103 mmol/L (ref 98–111)
Creatinine, Ser: 1.37 mg/dL — ABNORMAL HIGH (ref 0.61–1.24)
GFR, Estimated: 53 mL/min — ABNORMAL LOW (ref >60)
Glucose, Bld: 150 mg/dL — ABNORMAL HIGH (ref 70–99)
Potassium: 4.5 mmol/L (ref 3.5–5.1)
Sodium: 137 mmol/L (ref 135–145)
Total Bilirubin: 0.7 mg/dL (ref 0.3–1.2)
Total Protein: 6.7 g/dL (ref 6.5–8.1)

## 2021-03-10 LAB — CBC
HCT: 36.8 % — ABNORMAL LOW (ref 39.0–52.0)
Hemoglobin: 12.3 g/dL — ABNORMAL LOW (ref 13.0–17.0)
MCH: 31.1 pg (ref 26.0–34.0)
MCHC: 33.4 g/dL (ref 30.0–36.0)
MCV: 93.2 fL (ref 80.0–100.0)
Platelets: 142 10*3/uL — ABNORMAL LOW (ref 150–400)
RBC: 3.95 MIL/uL — ABNORMAL LOW (ref 4.22–5.81)
RDW: 13.7 % (ref 11.5–15.5)
WBC: 6.8 10*3/uL (ref 4.0–10.5)
nRBC: 0 % (ref 0.0–0.2)

## 2021-03-10 LAB — URINALYSIS, MICROSCOPIC (REFLEX)

## 2021-03-10 LAB — URINALYSIS, ROUTINE W REFLEX MICROSCOPIC
Glucose, UA: NEGATIVE mg/dL
Hgb urine dipstick: NEGATIVE
Ketones, ur: NEGATIVE mg/dL
Nitrite: NEGATIVE
Protein, ur: NEGATIVE mg/dL
Specific Gravity, Urine: 1.025 (ref 1.005–1.030)
pH: 5 (ref 5.0–8.0)

## 2021-03-10 LAB — RESP PANEL BY RT-PCR (FLU A&B, COVID) ARPGX2
Influenza A by PCR: NEGATIVE
Influenza B by PCR: NEGATIVE
SARS Coronavirus 2 by RT PCR: NEGATIVE

## 2021-03-10 LAB — TROPONIN I (HIGH SENSITIVITY): Troponin I (High Sensitivity): 15 ng/L (ref <18)

## 2021-03-10 LAB — D-DIMER, QUANTITATIVE: D-Dimer, Quant: 2.54 ug/mL-FEU — ABNORMAL HIGH (ref 0.00–0.50)

## 2021-03-10 MED ORDER — SODIUM CHLORIDE 0.9 % IV SOLN
1.0000 g | Freq: Once | INTRAVENOUS | Status: AC
  Administered 2021-03-10: 1 g via INTRAVENOUS
  Filled 2021-03-10: qty 10

## 2021-03-10 MED ORDER — SODIUM CHLORIDE 0.9 % IV SOLN: INTRAVENOUS | Status: DC

## 2021-03-10 MED ORDER — FELODIPINE ER 10 MG PO TB24
10.0000 mg | ORAL_TABLET | Freq: Every day | ORAL | Status: DC
  Administered 2021-03-11 – 2021-03-12 (×2): 10 mg via ORAL
  Filled 2021-03-10 (×3): qty 1

## 2021-03-10 MED ORDER — ACETAMINOPHEN 325 MG PO TABS
650.0000 mg | ORAL_TABLET | Freq: Once | ORAL | Status: AC
  Administered 2021-03-10: 650 mg via ORAL
  Filled 2021-03-10: qty 2

## 2021-03-10 MED ORDER — SODIUM CHLORIDE 0.9 % IV SOLN
2.0000 g | INTRAVENOUS | Status: DC
  Administered 2021-03-11 – 2021-03-14 (×4): 2 g via INTRAVENOUS
  Filled 2021-03-10: qty 2
  Filled 2021-03-10 (×2): qty 20
  Filled 2021-03-10 (×2): qty 2

## 2021-03-10 MED ORDER — ONDANSETRON HCL 4 MG PO TABS
4.0000 mg | ORAL_TABLET | Freq: Four times a day (QID) | ORAL | Status: DC | PRN
  Administered 2021-03-19 – 2021-03-20 (×2): 4 mg via ORAL
  Filled 2021-03-10 (×2): qty 1

## 2021-03-10 MED ORDER — MINOXIDIL 10 MG PO TABS
40.0000 mg | ORAL_TABLET | Freq: Every day | ORAL | Status: DC
  Administered 2021-03-11 – 2021-03-20 (×10): 40 mg via ORAL
  Filled 2021-03-10 (×10): qty 4

## 2021-03-10 MED ORDER — PRIMIDONE 50 MG PO TABS
50.0000 mg | ORAL_TABLET | Freq: Two times a day (BID) | ORAL | Status: DC
  Administered 2021-03-11 – 2021-03-20 (×19): 50 mg via ORAL
  Filled 2021-03-10 (×20): qty 1

## 2021-03-10 MED ORDER — ONDANSETRON HCL 4 MG/2ML IJ SOLN
4.0000 mg | Freq: Four times a day (QID) | INTRAMUSCULAR | Status: DC | PRN
  Administered 2021-03-12: 4 mg via INTRAVENOUS
  Filled 2021-03-10: qty 2

## 2021-03-10 MED ORDER — LABETALOL HCL 100 MG PO TABS
100.0000 mg | ORAL_TABLET | Freq: Two times a day (BID) | ORAL | Status: DC
  Administered 2021-03-10 – 2021-03-12 (×4): 100 mg via ORAL
  Filled 2021-03-10 (×4): qty 1

## 2021-03-10 MED ORDER — ALBUTEROL SULFATE (2.5 MG/3ML) 0.083% IN NEBU
2.5000 mg | INHALATION_SOLUTION | RESPIRATORY_TRACT | Status: DC | PRN
  Administered 2021-03-12: 2.5 mg via RESPIRATORY_TRACT
  Filled 2021-03-10: qty 3

## 2021-03-10 MED ORDER — PREDNISOLONE ACETATE 1 % OP SUSP
1.0000 [drp] | Freq: Every day | OPHTHALMIC | Status: DC
  Administered 2021-03-10 – 2021-03-19 (×10): 1 [drp] via OPHTHALMIC
  Filled 2021-03-10: qty 5

## 2021-03-10 MED ORDER — SIMVASTATIN 20 MG PO TABS
20.0000 mg | ORAL_TABLET | Freq: Every day | ORAL | Status: DC
  Administered 2021-03-11 – 2021-03-20 (×10): 20 mg via ORAL
  Filled 2021-03-10 (×10): qty 1

## 2021-03-10 MED ORDER — ACETAMINOPHEN 325 MG PO TABS
650.0000 mg | ORAL_TABLET | Freq: Four times a day (QID) | ORAL | Status: DC | PRN
  Administered 2021-03-10 – 2021-03-19 (×8): 650 mg via ORAL
  Filled 2021-03-10 (×8): qty 2

## 2021-03-10 MED ORDER — LAMOTRIGINE 100 MG PO TABS
300.0000 mg | ORAL_TABLET | Freq: Two times a day (BID) | ORAL | Status: DC
  Administered 2021-03-10 – 2021-03-20 (×19): 300 mg via ORAL
  Filled 2021-03-10 (×19): qty 3

## 2021-03-10 MED ORDER — GUAIFENESIN ER 600 MG PO TB12
600.0000 mg | ORAL_TABLET | Freq: Two times a day (BID) | ORAL | Status: DC
  Administered 2021-03-10: 600 mg via ORAL
  Filled 2021-03-10: qty 1

## 2021-03-10 MED ORDER — ENOXAPARIN SODIUM 40 MG/0.4ML IJ SOSY
40.0000 mg | PREFILLED_SYRINGE | INTRAMUSCULAR | Status: DC
  Administered 2021-03-10 – 2021-03-19 (×10): 40 mg via SUBCUTANEOUS
  Filled 2021-03-10 (×10): qty 0.4

## 2021-03-10 NOTE — ED Provider Notes (Signed)
I assumed care of this patient.  Please see previous provider note for further details of Hx, PE.  Briefly patient is a 75 y.o. male who presented shortness of breath and hypoxia.  History of bronchiectasis. Chest x-ray with bilateral lower lobe opacities. Plan is is for admission following COVID results. COVID-negative. Started on antibiotics for pneumonia. Patient requiring high flow nasal cannula.        Fatima Blank, MD 03/10/21 0730

## 2021-03-10 NOTE — ED Notes (Signed)
249m in bladder

## 2021-03-10 NOTE — Progress Notes (Signed)
Discussed plan of care for night with patient and family. Made RT aware that patient has his home cpap and will need it to be set up.

## 2021-03-10 NOTE — Progress Notes (Signed)
Patient accepted to progressive care at [facility]. EDP reports patient is stable on 10L high flow. [facility] hospitalists to assume care when patient arrives to accepting facility.

## 2021-03-10 NOTE — ED Notes (Addendum)
250 ml urine output from In and out performed with myself and Cyndi RN

## 2021-03-10 NOTE — ED Notes (Signed)
Report to [place] at [facility]

## 2021-03-10 NOTE — Plan of Care (Signed)
  Problem: Education: Goal: Knowledge of General Education information will improve Description: Including pain rating scale, medication(s)/side effects and non-pharmacologic comfort measures Outcome: Progressing   Problem: Activity: Goal: Risk for activity intolerance will decrease Outcome: Progressing   Problem: Nutrition: Goal: Adequate nutrition will be maintained Outcome: Progressing   Problem: Coping: Goal: Level of anxiety will decrease Outcome: Progressing   

## 2021-03-10 NOTE — ED Notes (Signed)
[facility] here to get pt 

## 2021-03-10 NOTE — Progress Notes (Addendum)
Patient wears CPAP at night a 12 CM of H2O with humidity and a full face mask..  Patient's wife lives nearby and his going to retrieve his CPAP machine and bring it the ED as it is the patient's preference to wear his own CPAP and has had problems having masks fit him in the past.  Placed patient on 10 liter Hi-FLO nasal cannula due to patient dropping to 86% while on 2 liter nasal cannula.  Patient's SPO2 increased to 97%.  RT will continue to monitor.

## 2021-03-10 NOTE — H&P (Signed)
History and Physical    TAVI ULMAN [id number] DOB: [birth year] DOA: 03/09/2021  PCP: Deland Pretty, MD  Patient coming from: home  Chief Complaint: disorientation, dizziness  HPI: [name] is a 75 y.o. male with medical history significant of HTN, HLD, neuropathy, OSA. Presenting with dyspnea and confusion. 3 days ago, the patient's family noticed that he was fatigued and had a lack of appetite. No N/V noted. Over the next couple of says, he developed weakness with ambulating. Family checked a home COVID test. It was negative. Yesterday they checked his O2 sat and it was down to 89-90. He seemed disoriented to time and could not recover. They called for EMS. They deny any other aggravating or alleviating factors.   ED Course: Found to be hypoxic. CXR w/ bibasilar atelectasis w/ infiltrate. Started on rocephin. COVID/flu negative. Started onO2. [facility] called for admission.   Review of Systems:  Review of systems is otherwise negative for all not mentioned in HPI.   PMHx Past Medical History:  Diagnosis Date   Cancer ([place])    Elevated diaphragm    Gout    History of prostate cancer Feb 1993   Hyperlipidemia    Hypertension    Hypoxemia    Normocytic anemia    OSA (obstructive sleep apnea)    Peripheral neuropathy    Pneumonia    Vision abnormalities     PSHx Past Surgical History:  Procedure Laterality Date   APPENDECTOMY     PROSTATECTOMY     SHOULDER SURGERY     bone spur removed from right    SocHx  reports that he has never smoked. He has never used smokeless tobacco. He reports previous alcohol use. He reports that he does not use drugs.  Allergies  Allergen Reactions   Azithromycin     Other reaction(s): abdominal pain   Codeine      nausea   Doxycycline     Hives and itching   Penicillins     Swelling, rash   Sulfa Antibiotics     FamHx Family History  Problem Relation Age of Onset   Emphysema Father    Congestive Heart Failure Mother     Emphysema Brother    Asthma Daughter     Prior to Admission medications   Medication Sig Start Date End Date Taking? Authorizing Provider  allopurinol (ZYLOPRIM) 100 MG tablet Take 150 mg by mouth daily.    [provider]  Cholecalciferol (VITAMIN D3) 1000 UNITS tablet Take 1,000 Units by mouth daily.    [provider]  clobetasol cream (TEMOVATE) 0.05 % Apply topically. 08/30/11   [provider]  felodipine (PLENDIL) 5 MG 24 hr tablet Take 10 mg by mouth daily.    [provider]  Fiber CAPS Take 1 capsule by mouth daily.    [provider]  labetalol (NORMODYNE) 100 MG tablet Take 1 tablet (100 mg total) by mouth 2 (two) times daily. 02/01/21   Patwardhan, Reynold Bowen, MD  lamoTRIgine (LAMICTAL) 150 MG tablet Take 2 tablets (300 mg total) by mouth 2 (two) times daily. 10/03/20   Sater, Nanine Means, MD  losartan (COZAAR) 100 MG tablet Take 1 tablet (100 mg total) by mouth daily. 10/31/20   Patwardhan, Reynold Bowen, MD  losartan (COZAAR) 50 MG tablet Take 50 mg by mouth daily. 03/04/21   [provider]  meloxicam (MOBIC) 7.5 MG tablet Take 7.5 mg by mouth daily.    [provider]  minoxidil (  LONITEN) 10 MG tablet Take 40 mg by mouth daily. 03/06/20   [provider]  Multiple Vitamin (MULTIVITAMIN) tablet Take 1 tablet by mouth daily.    [provider]  pantoprazole (PROTONIX) 40 MG tablet  04/24/20   [provider]  prednisoLONE acetate (PRED FORTE) 1 % ophthalmic suspension Place 1 drop into the left eye at bedtime.  09/07/14   [provider]  primidone (MYSOLINE) 50 MG tablet Take 1 tablet (50 mg total) by mouth in the morning and at bedtime. 02/13/21   Sater, Nanine Means, MD  simvastatin (ZOCOR) 20 MG tablet Take 20 mg by mouth daily.    [provider]  zolpidem (AMBIEN) 10 MG tablet Take 10 mg by mouth at bedtime as needed.    [provider]    Physical Exam: Vitals:   03/10/21  0930 03/10/21 0945 03/10/21 1009 03/10/21 1113  BP: (!) 113/46   130/60  Pulse: 86 86  85  Resp: '19 20  18  '$ Temp:   99.5 F (37.5 C) 98.4 F (36.9 C)  TempSrc:   Oral Oral  SpO2: 94% 94%  98%  Weight:    82.8 kg  Height:    '5\' 11"'$  (1.803 m)    General: 75 y.o. male resting in bed in NAD Eyes: PERRL, normal sclera ENMT: Nares patent w/o discharge, orophaynx clear, dentition normal, ears w/o discharge/lesions/ulcers Neck: Supple, trachea midline Cardiovascular: RRR, +S1, S2, no m/g/r, equal pulses throughout Respiratory: decreased at bases, no w/r/r, normal WOB on 6L [place] GI: BS+, NDNT, no masses noted, no organomegaly noted MSK: No e/c/c Skin: No rashes, bruises, ulcerations noted Neuro: A&O x 3, no focal deficits Psyc: Appropriate interaction and affect, calm/cooperative  Labs on Admission: I have personally reviewed following labs and imaging studies  CBC: Recent Labs  Lab 03/09/21 2054  WBC 7.7  NEUTROABS 6.3  HGB 12.2*  HCT 35.6*  MCV 91.0  PLT [id number]*   Basic Metabolic Panel: Recent Labs  Lab 03/09/21 2054  NA 135  K 4.7  CL 99  CO2 28  GLUCOSE 141*  BUN 32*  CREATININE 1.61*  CALCIUM 8.6*   GFR: Estimated Creatinine Clearance: 41.6 mL/min (A) (by C-G formula based on SCr of 1.61 mg/dL (H)). Liver Function Tests: Recent Labs  Lab 03/09/21 2054  AST 16  ALT 11  ALKPHOS 81  BILITOT 0.9  PROT 6.5  ALBUMIN 3.7   Recent Labs  Lab 03/09/21 2054  LIPASE 24   No results for input(s): AMMONIA in the last 168 hours. Coagulation Profile: No results for input(s): INR, PROTIME in the last 168 hours. Cardiac Enzymes: No results for input(s): CKTOTAL, CKMB, CKMBINDEX, TROPONINI in the last 168 hours. BNP (last 3 results) No results for input(s): PROBNP in the last 8760 hours. HbA1C: No results for input(s): HGBA1C in the last 72 hours. CBG: No results for input(s): GLUCAP in the last 168 hours. Lipid Profile: No results for input(s): CHOL, HDL,  LDLCALC, TRIG, CHOLHDL, LDLDIRECT in the last 72 hours. Thyroid Function Tests: No results for input(s): TSH, T4TOTAL, FREET4, T3FREE, THYROIDAB in the last 72 hours. Anemia Panel: No results for input(s): VITAMINB12, FOLATE, FERRITIN, TIBC, IRON, RETICCTPCT in the last 72 hours. Urine analysis:    Component Value Date/Time   COLORURINE YELLOW 03/10/2021 0800   APPEARANCEUR HAZY (A) 03/10/2021 0800   LABSPEC 1.025 03/10/2021 0800   PHURINE 5.0 03/10/2021 0800   GLUCOSEU NEGATIVE 03/10/2021 0800   HGBUR NEGATIVE 03/10/2021 0800  BILIRUBINUR SMALL (A) 03/10/2021 0800   KETONESUR NEGATIVE 03/10/2021 0800   PROTEINUR NEGATIVE 03/10/2021 0800   UROBILINOGEN 0.2 01/05/2015 1220   NITRITE NEGATIVE 03/10/2021 0800   LEUKOCYTESUR SMALL (A) 03/10/2021 0800    Radiological Exams on Admission: CT HEAD WO CONTRAST (5MM)  Result Date: 03/09/2021 CLINICAL DATA:  Mental status change, unknown cause. Dizziness, weakness EXAM: CT HEAD WITHOUT CONTRAST TECHNIQUE: Contiguous axial images were obtained from the base of the skull through the vertex without intravenous contrast. COMPARISON:  MRI 01/22/2015 FINDINGS: Brain: Normal anatomic configuration. Parenchymal volume loss is commensurate with the patient's age. Mild periventricular white matter changes are present likely reflecting the sequela of small vessel ischemia. No abnormal intra or extra-axial mass lesion or fluid collection. No abnormal mass effect or midline shift. No evidence of acute intracranial hemorrhage or infarct. Ventricular size is normal. Cerebellum unremarkable. Vascular: No asymmetric hyperdense vasculature at the skull base. Skull: Intact Sinuses/Orbits: Paranasal sinuses are clear. Orbits are unremarkable. Other: Mastoid air cells and middle ear cavities are clear. IMPRESSION: No acute intracranial abnormality.  Mild senescent change. Electronically Signed   By: Fidela Salisbury MD   On: 03/09/2021 20:46   DG Chest Portable 1  View  Result Date: 03/09/2021 CLINICAL DATA:  Altered level of consciousness, dizziness EXAM: PORTABLE CHEST 1 VIEW COMPARISON:  12/27/2015 FINDINGS: Single frontal view of the chest demonstrates an unremarkable cardiac silhouette. Chronic elevation of the left hemidiaphragm. Lung volumes are diminished, with bibasilar consolidation left greater than right. No effusion or pneumothorax. IMPRESSION: 1. Low lung volumes with bibasilar consolidation, favor atelectasis over airspace disease. Electronically Signed   By: Randa Ngo M.D.   On: 03/09/2021 20:44    EKG: Independently reviewed. Sinus, no st elevations  Assessment/Plan Bibasilar PNA Acute metabolic encephalopathy secondary to above     - admitted to inpt, progressive     - continue rocephin     - check urine strep/legionella     - IS, PRN albuterol     - check d-dimer     - CXR as above     - wean O2 as able  AKI on CKD3a     - hold ARB, allopurinol, mobic     - fluids, check renal US  HTN     - hold ARB, continue remainder of home regimen  HLD     - continue home statin  Neuropathy     - continue home regimen  Gout     - holding allopurinol d/t AKI  OSA     - CPAP at night  DVT prophylaxis: lovenox  Code Status: FULL  Family Communication: w/ wife and dtr at bedside  Consults called: None   Status is: Inpatient  Remains inpatient appropriate because:Inpatient level of care appropriate due to severity of illness  Dispo: The patient is from: Home              Anticipated d/c is to: Home              Patient currently is not medically stable to d/c.   Difficult to place patient No  Time spent coordinating admission: 70 minutes  [facility]  If 7PM-7AM, please contact night-coverage [email or web address]  03/10/2021, 11:28 AM

## 2021-03-10 NOTE — ED Notes (Signed)
Wife at bedside , pt medicated for temp per dr orders

## 2021-03-11 ENCOUNTER — Inpatient Hospital Stay (HOSPITAL_COMMUNITY): Payer: Medicare PPO

## 2021-03-11 DIAGNOSIS — G9341 Metabolic encephalopathy: Secondary | ICD-10-CM | POA: Diagnosis present

## 2021-03-11 DIAGNOSIS — I10 Essential (primary) hypertension: Secondary | ICD-10-CM

## 2021-03-11 DIAGNOSIS — N1831 Chronic kidney disease, stage 3a: Secondary | ICD-10-CM

## 2021-03-11 DIAGNOSIS — G4733 Obstructive sleep apnea (adult) (pediatric): Secondary | ICD-10-CM

## 2021-03-11 DIAGNOSIS — N179 Acute kidney failure, unspecified: Secondary | ICD-10-CM | POA: Diagnosis present

## 2021-03-11 DIAGNOSIS — J189 Pneumonia, unspecified organism: Secondary | ICD-10-CM | POA: Diagnosis present

## 2021-03-11 DIAGNOSIS — E7800 Pure hypercholesterolemia, unspecified: Secondary | ICD-10-CM

## 2021-03-11 LAB — COMPREHENSIVE METABOLIC PANEL
ALT: 13 U/L (ref 0–44)
AST: 18 U/L (ref 15–41)
Albumin: 3.4 g/dL — ABNORMAL LOW (ref 3.5–5.0)
Alkaline Phosphatase: 87 U/L (ref 38–126)
Anion gap: 5 (ref 5–15)
BUN: 28 mg/dL — ABNORMAL HIGH (ref 8–23)
CO2: 26 mmol/L (ref 22–32)
Calcium: 8.4 mg/dL — ABNORMAL LOW (ref 8.9–10.3)
Chloride: 104 mmol/L (ref 98–111)
Creatinine, Ser: 1.28 mg/dL — ABNORMAL HIGH (ref 0.61–1.24)
GFR, Estimated: 58 mL/min — ABNORMAL LOW (ref >60)
Glucose, Bld: 102 mg/dL — ABNORMAL HIGH (ref 70–99)
Potassium: 4.3 mmol/L (ref 3.5–5.1)
Sodium: 135 mmol/L (ref 135–145)
Total Bilirubin: 0.9 mg/dL (ref 0.3–1.2)
Total Protein: 6.3 g/dL — ABNORMAL LOW (ref 6.5–8.1)

## 2021-03-11 LAB — CBC
HCT: 35.9 % — ABNORMAL LOW (ref 39.0–52.0)
Hemoglobin: 11.8 g/dL — ABNORMAL LOW (ref 13.0–17.0)
MCH: 30.6 pg (ref 26.0–34.0)
MCHC: 32.9 g/dL (ref 30.0–36.0)
MCV: 93 fL (ref 80.0–100.0)
Platelets: 126 10*3/uL — ABNORMAL LOW (ref 150–400)
RBC: 3.86 MIL/uL — ABNORMAL LOW (ref 4.22–5.81)
RDW: 13.6 % (ref 11.5–15.5)
WBC: 6.5 10*3/uL (ref 4.0–10.5)
nRBC: 0 % (ref 0.0–0.2)

## 2021-03-11 LAB — MAGNESIUM: Magnesium: 2.2 mg/dL (ref 1.7–2.4)

## 2021-03-11 LAB — GLUCOSE, CAPILLARY: Glucose-Capillary: 180 mg/dL — ABNORMAL HIGH (ref 70–99)

## 2021-03-11 LAB — STREP PNEUMONIAE URINARY ANTIGEN: Strep Pneumo Urinary Antigen: NEGATIVE

## 2021-03-11 MED ORDER — VANCOMYCIN HCL 1500 MG/300ML IV SOLN
1500.0000 mg | INTRAVENOUS | Status: DC
  Administered 2021-03-12 – 2021-03-14 (×3): 1500 mg via INTRAVENOUS
  Filled 2021-03-11 (×3): qty 300

## 2021-03-11 MED ORDER — IOHEXOL 350 MG/ML SOLN
75.0000 mL | Freq: Once | INTRAVENOUS | Status: AC | PRN
  Administered 2021-03-11: 75 mL via INTRAVENOUS

## 2021-03-11 MED ORDER — FLUTICASONE PROPIONATE 50 MCG/ACT NA SUSP
2.0000 | Freq: Every day | NASAL | Status: DC
  Administered 2021-03-12 – 2021-03-20 (×8): 2 via NASAL
  Filled 2021-03-11: qty 16

## 2021-03-11 MED ORDER — IPRATROPIUM-ALBUTEROL 0.5-2.5 (3) MG/3ML IN SOLN: 3.0000 mL | Freq: Two times a day (BID) | RESPIRATORY_TRACT | Status: DC

## 2021-03-11 MED ORDER — GUAIFENESIN ER 600 MG PO TB12
1200.0000 mg | ORAL_TABLET | Freq: Two times a day (BID) | ORAL | Status: DC
  Administered 2021-03-11 – 2021-03-20 (×19): 1200 mg via ORAL
  Filled 2021-03-11 (×19): qty 2

## 2021-03-11 MED ORDER — PANTOPRAZOLE SODIUM 40 MG PO TBEC
40.0000 mg | DELAYED_RELEASE_TABLET | Freq: Every day | ORAL | Status: DC
  Administered 2021-03-11 – 2021-03-20 (×10): 40 mg via ORAL
  Filled 2021-03-11 (×10): qty 1

## 2021-03-11 MED ORDER — VANCOMYCIN HCL 1750 MG/350ML IV SOLN
1750.0000 mg | Freq: Once | INTRAVENOUS | Status: AC
  Administered 2021-03-11: 1750 mg via INTRAVENOUS
  Filled 2021-03-11: qty 350

## 2021-03-11 MED ORDER — LORATADINE 10 MG PO TABS
10.0000 mg | ORAL_TABLET | Freq: Every day | ORAL | Status: DC
  Administered 2021-03-11 – 2021-03-20 (×8): 10 mg via ORAL
  Filled 2021-03-11 (×10): qty 1

## 2021-03-11 MED ORDER — IPRATROPIUM-ALBUTEROL 0.5-2.5 (3) MG/3ML IN SOLN
3.0000 mL | Freq: Three times a day (TID) | RESPIRATORY_TRACT | Status: DC
  Administered 2021-03-11: 3 mL via RESPIRATORY_TRACT
  Filled 2021-03-11: qty 3

## 2021-03-11 MED ORDER — IBUPROFEN 200 MG PO TABS
400.0000 mg | ORAL_TABLET | Freq: Four times a day (QID) | ORAL | Status: DC | PRN
  Administered 2021-03-11 – 2021-03-19 (×2): 400 mg via ORAL
  Filled 2021-03-11 (×2): qty 2

## 2021-03-11 NOTE — Evaluation (Signed)
SLP Cancellation Note  Patient Details Name: [name] MRN: [id number] DOB: [birth year]   Cancelled treatment:       Reason Eval/Treat Not Completed: Other (comment) (Per RN communication via secure chat, pt tolerating po and medications will, will follow up next date.  Thanks.)  Kathleen Lime, MS [facility] SLP Acute Rehab Services Office [phone] Pager [phone]  [name] 03/11/2021, 5:44 PM

## 2021-03-11 NOTE — Progress Notes (Signed)
Pt wearing home cpap unit

## 2021-03-11 NOTE — Progress Notes (Signed)
PROGRESS NOTE    ANNE MANEVAL  [id number] DOB: 31-Aug-1945 DOA: 03/09/2021 PCP: Deland Pretty, MD (Confirm with patient/family/NH records and if not entered, this HAS to be entered at [facility] point of entry. "No PCP" if truly none.)   Chief Complaint  Patient presents with   Dizziness   Weakness    Brief Narrative:  Patient is a very pleasant 75 year old gentleman history of hypertension, hyperlipidemia, neuropathy, OSA presented with dyspnea, confusion.  It was noted that 3 days prior to admission patient's family noticed patient was fatigue, decreased appetite, no nausea or vomiting, development of generalized weakness with ambulation and noted to be hypoxic.  Patient noted to have sats down to 89 to 90%, seemed disoriented and subsequently EMS called.  Patient brought to the ED found to be hypoxic, chest x-ray with bibasilar atelectasis versus infiltrate, noted to have a fever, COVID-19 PCR/flu negative.  Patient placed on oxygen, started on IV Rocephin, hospitalist called to admit.   Assessment & Plan:   Principal Problem:   Acute respiratory failure with hypoxia (HCC) Active Problems:   OSA (obstructive sleep apnea)   Essential hypertension   Hypercholesterolemia   Acute metabolic encephalopathy   CAP (community acquired pneumonia)   Acute renal failure superimposed on stage 3a chronic kidney disease (HCC)  #1 acute hypoxic respiratory failure secondary to probable CAP -Patient presented with confusion, generalized weakness, hypoxia, initially required 6 L high flow nasal cannula. -D-dimer noted to be elevated at 2.54. -Lactic acid level at 1.2. -Cardiac enzymes negative. -CT angiogram chest negative for PE with small left pleural effusion and bibasilar atelectasis/scarring. -Fever curve trending down. -WBC within normal limits. -Blood cultures pending. -Urine Legionella antigen negative. -Urine pneumococcus antigen negative. -Urine cultures with 10,000 colonies  Enterococcus faecalis. -Continue empiric IV Rocephin. -Start Flonase, increase Mucinex to 1200 twice daily, start scheduled nebs, Claritin, PPI. -Check ambulatory sats in the morning. -Supportive care.  2.  Acute metabolic encephalopathy -Likely secondary to problem #1. -Clinical improvement. -Patient alert and oriented x3 able to state who the current president is as well as former presidents all the way to [place]. -Patient pancultured. -Continue empiric IV antibiotics. -Supportive care.  3.  Hyperlipidemia -Statin.  4.  Hypertension -Continue Plendil, labetalol. -Continue to hold ARB.  5.-Acute kidney injury on chronic kidney disease stage IIIa -Likely secondary to prerenal azotemia in the setting of ARB, allopurinol, Mobic. -Renal ultrasound negative for hydronephrosis. -Renal function improving with hydration. -Decrease IV fluids to 50 cc an hour. -Follow.  6.  Gout -Continue to hold allopurinol due to AKI.  7.  OSA CPAP nightly.  DVT prophylaxis: Lovenox Code Status: Full Family Communication: Updated patient, wife, daughter at bedside. Disposition:   Status is: Inpatient  Remains inpatient appropriate because:Inpatient level of care appropriate due to severity of illness  Dispo: The patient is from: Home              Anticipated d/c is to: Home              Patient currently is not medically stable to d/c.   Difficult to place patient No       Consultants:  None  Procedures:  CT angiogram chest 03/11/2021 Chest x-ray 03/09/2021 CT head 03/09/2021 Renal ultrasound 03/10/2021  Antimicrobials:  IV Rocephin 03/10/2021>>>>   Subjective: Alert oriented to self place and time.  Knows who the current president is and the prior presidents all the way to [place].  Denies any chest pain.  Denies any significant  shortness of breath.  Overall feeling better than he did on admission.  Complaining of some subjective fevers.  Wife and daughter at bedside.  Oxygen has been  weaned down currently on 2 L high flow nasal cannula with sats of 94%  Objective: Vitals:   03/11/21 0550 03/11/21 0937 03/11/21 1125 03/11/21 1334  BP: 130/69 137/64  (!) 110/44  Pulse: 92 98  84  Resp: 20   20  Temp: 100.3 F (37.9 C)   99.1 F (37.3 C)  TempSrc:      SpO2: 91% 96% 94% 93%  Weight:      Height:        Intake/Output Summary (Last 24 hours) at 03/11/2021 1718 Last data filed at 03/11/2021 1400 Gross per 24 hour  Intake 1725.62 ml  Output 525 ml  Net 1200.62 ml   Filed Weights   03/09/21 2011 03/10/21 1113  Weight: 86.2 kg 82.8 kg    Examination:  General exam: Appears calm and comfortable  Respiratory system: Some coarse breath sounds in the bases.  No wheezing.  Fair air movement.  Speaking in full sentences.  Cardiovascular system: S1 & S2 heard, RRR. No JVD, murmurs, rubs, gallops or clicks. No pedal edema. Gastrointestinal system: Abdomen is nondistended, soft and nontender. No organomegaly or masses felt. Normal bowel sounds heard. Central nervous system: Alert and oriented. No focal neurological deficits. Extremities: Symmetric 5 x 5 power. Skin: No rashes, lesions or ulcers Psychiatry: Judgement and insight appear normal. Mood & affect appropriate.     Data Reviewed: I have personally reviewed following labs and imaging studies  CBC: Recent Labs  Lab 03/09/21 2054 03/10/21 1415 03/11/21 0537  WBC 7.7 6.8 6.5  NEUTROABS 6.3  --   --   HGB 12.2* 12.3* 11.8*  HCT 35.6* 36.8* 35.9*  MCV 91.0 93.2 93.0  PLT 136* 142* 126*    Basic Metabolic Panel: Recent Labs  Lab 03/09/21 2054 03/10/21 1415 03/11/21 0537  NA 135 137 135  K 4.7 4.5 4.3  CL 99 103 104  CO2 '28 26 26  '$ GLUCOSE 141* 150* 102*  BUN 32* 29* 28*  CREATININE 1.61* 1.37* 1.28*  CALCIUM 8.6* 8.7* 8.4*  MG  --   --  2.2    GFR: Estimated Creatinine Clearance: 52.3 mL/min (A) (by C-G formula based on SCr of 1.28 mg/dL (H)).  Liver Function Tests: Recent Labs  Lab  03/09/21 2054 03/10/21 1415 03/11/21 0537  AST '16 17 18  '$ ALT '11 13 13  '$ ALKPHOS 81 88 87  BILITOT 0.9 0.7 0.9  PROT 6.5 6.7 6.3*  ALBUMIN 3.7 3.7 3.4*    CBG: No results for input(s): GLUCAP in the last 168 hours.   Recent Results (from the past 240 hour(s))  Resp Panel by RT-PCR (Flu A&B, Covid) Nasopharyngeal Swab     Status: None   Collection Time: 03/09/21 11:25 PM   Specimen: Nasopharyngeal Swab; Nasopharyngeal(NP) swabs in vial transport medium  Result Value Ref Range Status   SARS Coronavirus 2 by RT PCR NEGATIVE NEGATIVE Final    Comment: (NOTE) SARS-CoV-2 target nucleic acids are NOT DETECTED.  The SARS-CoV-2 RNA is generally detectable in upper respiratory specimens during the acute phase of infection. The lowest concentration of SARS-CoV-2 viral copies this assay can detect is 138 copies/mL. A negative result does not preclude SARS-Cov-2 infection and should not be used as the sole basis for treatment or other patient management decisions. A negative result may occur with  improper  specimen collection/handling, submission of specimen other than nasopharyngeal swab, presence of viral mutation(s) within the areas targeted by this assay, and inadequate number of viral copies(<138 copies/mL). A negative result must be combined with clinical observations, patient history, and epidemiological information. The expected result is Negative.  Fact Sheet for Patients:  EntrepreneurPulse.com.au  Fact Sheet for Healthcare Providers:  IncredibleEmployment.be  This test is no t yet approved or cleared by the [place] FDA and  has been authorized for detection and/or diagnosis of SARS-CoV-2 by FDA under an Emergency Use Authorization (EUA). This EUA will remain  in effect (meaning this test can be used) for the duration of the COVID-19 declaration under Section 564(b)(1) of the Act, 21 U.S.C.section 360bbb-3(b)(1), unless the  authorization is terminated  or revoked sooner.       Influenza A by PCR NEGATIVE NEGATIVE Final   Influenza B by PCR NEGATIVE NEGATIVE Final    Comment: (NOTE) The Xpert Xpress SARS-CoV-2/FLU/RSV plus assay is intended as an aid in the diagnosis of influenza from Nasopharyngeal swab specimens and should not be used as a sole basis for treatment. Nasal washings and aspirates are unacceptable for Xpert Xpress SARS-CoV-2/FLU/RSV testing.  Fact Sheet for Patients: EntrepreneurPulse.com.au  Fact Sheet for Healthcare Providers: IncredibleEmployment.be  This test is not yet approved or cleared by the [place] FDA and has been authorized for detection and/or diagnosis of SARS-CoV-2 by FDA under an Emergency Use Authorization (EUA). This EUA will remain in effect (meaning this test can be used) for the duration of the COVID-19 declaration under Section 564(b)(1) of the Act, 21 U.S.C. section 360bbb-3(b)(1), unless the authorization is terminated or revoked.  Performed at [facility], [address] [place], [place] [address]   Urine Culture     Status: Abnormal (Preliminary result)   Collection Time: 03/10/21  8:00 AM   Specimen: Urine, Clean Catch  Result Value Ref Range Status   Specimen Description   Final    URINE, CLEAN CATCH Performed at [facility], [address] [place], [place] [address]    Special Requests   Final    NONE Performed at [facility], [address] [place], [place] [address]    Culture (A)  Final    10,000 COLONIES/mL ENTEROCOCCUS FAECALIS CULTURE REINCUBATED FOR BETTER GROWTH Performed at [facility], [address] [address], [place], [place] [address]    Report Status PENDING  Incomplete         Radiology Studies: CT HEAD WO CONTRAST (5MM)  Result Date: 03/09/2021 CLINICAL DATA:  Mental status change, unknown cause. Dizziness, weakness EXAM: CT HEAD WITHOUT CONTRAST  TECHNIQUE: Contiguous axial images were obtained from the base of the skull through the vertex without intravenous contrast. COMPARISON:  MRI 01/22/2015 FINDINGS: Brain: Normal anatomic configuration. Parenchymal volume loss is commensurate with the patient's age. Mild periventricular white matter changes are present likely reflecting the sequela of small vessel ischemia. No abnormal intra or extra-axial mass lesion or fluid collection. No abnormal mass effect or midline shift. No evidence of acute intracranial hemorrhage or infarct. Ventricular size is normal. Cerebellum unremarkable. Vascular: No asymmetric hyperdense vasculature at the skull base. Skull: Intact Sinuses/Orbits: Paranasal sinuses are clear. Orbits are unremarkable. Other: Mastoid air cells and middle ear cavities are clear. IMPRESSION: No acute intracranial abnormality.  Mild senescent change. Electronically Signed   By: Fidela Salisbury MD   On: 03/09/2021 20:46   CT Angio Chest Pulmonary Embolism (  PE) W or WO Contrast  Result Date: 03/11/2021 CLINICAL DATA:  Dyspnea, confusion, turn for pulse very embolism. EXAM: CT ANGIOGRAPHY CHEST WITH CONTRAST TECHNIQUE: Multidetector CT imaging of the chest was performed using the standard protocol during bolus administration of intravenous contrast. Multiplanar CT image reconstructions and MIPs were obtained to evaluate the vascular anatomy. CONTRAST:  34m OMNIPAQUE IOHEXOL 350 MG/ML SOLN COMPARISON:  Chest CT dated 10/16/2018 and chest radiograph dated 03/09/2021. FINDINGS: Cardiovascular: Satisfactory opacification of the pulmonary arteries to the segmental level. No evidence of pulmonary embolism. Vascular calcifications are seen in the coronary arteries and aortic arch. Normal heart size. No pericardial effusion. Mediastinum/Nodes: Prominent mediastinal lymph nodes are not changed since at least 2016. No enlarged hilar or axillary lymph nodes. Thyroid gland, trachea, and esophagus demonstrate no  significant findings. Lungs/Pleura: Moderate bibasilar atelectasis/scarring is increased since 10/16/2018. There is a small left pleural effusion. No pleural effusion on the right or pneumothorax on either side. Upper Abdomen: A cyst in the liver measures 2.3 cm, unchanged. Musculoskeletal: Degenerative changes are seen in the spine. Review of the MIP images confirms the above findings. IMPRESSION: 1. No evidence of pulmonary embolism. 2. Small left pleural effusion and bibasilar atelectasis/scarring. Aortic Atherosclerosis (ICD10-I70.0). Electronically Signed   By: TZerita BoersM.D.   On: 03/11/2021 10:37   [place]RENAL  Result Date: 03/10/2021 CLINICAL DATA:  Acute renal injury EXAM: RENAL / URINARY TRACT ULTRASOUND COMPLETE COMPARISON:  02/07/2011 FINDINGS: Right Kidney: Renal measurements: 11.3 x 4.2 x 7.5 cm. = volume: 189 mL. Echogenicity within normal limits. No mass or hydronephrosis visualized. Left Kidney: Renal measurements: 12.4 x 5.5 x 6.8 cm. = volume: 240 mL. Echogenicity within normal limits. No mass or hydronephrosis visualized. Bladder: Decompressed Other: None. IMPRESSION: Normal appearing kidneys bilaterally. Previously seen right renal cyst is not well appreciated on this exam. Electronically Signed   By: MInez CatalinaM.D.   On: 03/10/2021 19:16   DG Chest Portable 1 View  Result Date: 03/09/2021 CLINICAL DATA:  Altered level of consciousness, dizziness EXAM: PORTABLE CHEST 1 VIEW COMPARISON:  12/27/2015 FINDINGS: Single frontal view of the chest demonstrates an unremarkable cardiac silhouette. Chronic elevation of the left hemidiaphragm. Lung volumes are diminished, with bibasilar consolidation left greater than right. No effusion or pneumothorax. IMPRESSION: 1. Low lung volumes with bibasilar consolidation, favor atelectasis over airspace disease. Electronically Signed   By: MRanda NgoM.D.   On: 03/09/2021 20:44        Scheduled Meds:  enoxaparin (LOVENOX) injection  40 mg  Subcutaneous Q24H   felodipine  10 mg Oral Daily   fluticasone  2 spray Each Nare Daily   guaiFENesin  1,200 mg Oral BID   ipratropium-albuterol  3 mL Nebulization BID   labetalol  100 mg Oral BID   lamoTRIgine  300 mg Oral BID   loratadine  10 mg Oral Daily   minoxidil  40 mg Oral Daily   pantoprazole  40 mg Oral Q0600   prednisoLONE acetate  1 drop Left Eye QHS   primidone  50 mg Oral BID   simvastatin  20 mg Oral Daily   Continuous Infusions:  sodium chloride 100 mL/hr at 03/11/21 0940   cefTRIAXone (ROCEPHIN)  IV 2 g (03/11/21 0941)     LOS: 1 day    Time spent: 40 minutes    DIrine Seal MD [facility]   To contact the attending provider between 7A-7P or the covering provider during after hours 7P-7A, please log into  the web site [email or web address] and access using universal [place] password for that web site. If you do not have the password, please call the hospital operator.  03/11/2021, 5:18 PM

## 2021-03-11 NOTE — Progress Notes (Signed)
Pharmacy Antibiotic Note  [name] is a 75 y.o. male presented to the ED on 03/09/2021 with c/o generalized weakness and dizziness. He was started on ceftriaxone on admission for PNA. Pharmacy has been consulted on 8/6 to add vancomycin as patient continues to be febrile.  8/6 vanc>> 8/5 CTX>>  8/5 ucx: 8/2 ucx: 10K enterococcus faecalis 8/4 bcx x2:  Plan: - vancomycin 1750 mg IV x1, then 1500 mg q24h for est AUC 525 - ceftriaxone 2gm q24h  ________________________________________  Height: '5\' 11"'$  (180.3 cm) Weight: 82.8 kg (182 lb 8.7 oz) IBW/kg (Calculated) : 75.3  Temp (24hrs), Avg:100.4 F (38 C), Min:98.9 F (37.2 C), Max:103.2 F (39.6 C)  Recent Labs  Lab 03/09/21 2054 03/09/21 2059 03/10/21 1415 03/11/21 0537  WBC 7.7  --  6.8 6.5  CREATININE 1.61*  --  1.37* 1.28*  LATICACIDVEN  --  1.2  --   --     Estimated Creatinine Clearance: 52.3 mL/min (A) (by C-G formula based on SCr of 1.28 mg/dL (H)).    Allergies  Allergen Reactions   Azithromycin     Other reaction(s): abdominal pain   Codeine      nausea   Doxycycline     Hives and itching   Penicillins     Swelling, rash   Sulfa Antibiotics      Thank you for allowing pharmacy to be a part of this patient's care.  Lynelle Doctor 03/11/2021 6:50 PM

## 2021-03-12 ENCOUNTER — Inpatient Hospital Stay (HOSPITAL_COMMUNITY): Payer: Medicare PPO

## 2021-03-12 DIAGNOSIS — I959 Hypotension, unspecified: Secondary | ICD-10-CM | POA: Diagnosis not present

## 2021-03-12 LAB — BLOOD GAS, ARTERIAL
Acid-base deficit: 4.7 mmol/L — ABNORMAL HIGH (ref 0.0–2.0)
Bicarbonate: 20.5 mmol/L (ref 20.0–28.0)
Drawn by: 308601
FIO2: 36
O2 Content: 4 L/min
O2 Saturation: 97.7 %
Patient temperature: 98.6
pCO2 arterial: 41.1 mmHg (ref 32.0–48.0)
pH, Arterial: 7.319 — ABNORMAL LOW (ref 7.350–7.450)
pO2, Arterial: 112 mmHg — ABNORMAL HIGH (ref 83.0–108.0)

## 2021-03-12 LAB — CBC WITH DIFFERENTIAL/PLATELET
Abs Immature Granulocytes: 0.03 10*3/uL (ref 0.00–0.07)
Basophils Absolute: 0 10*3/uL (ref 0.0–0.1)
Basophils Relative: 0 %
Eosinophils Absolute: 0.1 10*3/uL (ref 0.0–0.5)
Eosinophils Relative: 2 %
HCT: 30.9 % — ABNORMAL LOW (ref 39.0–52.0)
Hemoglobin: 10.2 g/dL — ABNORMAL LOW (ref 13.0–17.0)
Immature Granulocytes: 1 %
Lymphocytes Relative: 9 %
Lymphs Abs: 0.5 10*3/uL — ABNORMAL LOW (ref 0.7–4.0)
MCH: 31.3 pg (ref 26.0–34.0)
MCHC: 33 g/dL (ref 30.0–36.0)
MCV: 94.8 fL (ref 80.0–100.0)
Monocytes Absolute: 0.5 10*3/uL (ref 0.1–1.0)
Monocytes Relative: 9 %
Neutro Abs: 4.1 10*3/uL (ref 1.7–7.7)
Neutrophils Relative %: 79 %
Platelets: 126 10*3/uL — ABNORMAL LOW (ref 150–400)
RBC: 3.26 MIL/uL — ABNORMAL LOW (ref 4.22–5.81)
RDW: 13.7 % (ref 11.5–15.5)
WBC: 5.2 10*3/uL (ref 4.0–10.5)
nRBC: 0 % (ref 0.0–0.2)

## 2021-03-12 LAB — BASIC METABOLIC PANEL
Anion gap: 5 (ref 5–15)
BUN: 29 mg/dL — ABNORMAL HIGH (ref 8–23)
CO2: 24 mmol/L (ref 22–32)
Calcium: 7.8 mg/dL — ABNORMAL LOW (ref 8.9–10.3)
Chloride: 106 mmol/L (ref 98–111)
Creatinine, Ser: 1.27 mg/dL — ABNORMAL HIGH (ref 0.61–1.24)
GFR, Estimated: 59 mL/min — ABNORMAL LOW (ref >60)
Glucose, Bld: 107 mg/dL — ABNORMAL HIGH (ref 70–99)
Potassium: 4 mmol/L (ref 3.5–5.1)
Sodium: 135 mmol/L (ref 135–145)

## 2021-03-12 LAB — URINALYSIS, ROUTINE W REFLEX MICROSCOPIC
Glucose, UA: NEGATIVE mg/dL
Hgb urine dipstick: NEGATIVE
Ketones, ur: 5 mg/dL — AB
Nitrite: NEGATIVE
Protein, ur: 30 mg/dL — AB
Specific Gravity, Urine: 1.04 — ABNORMAL HIGH (ref 1.005–1.030)
pH: 5 (ref 5.0–8.0)

## 2021-03-12 LAB — URINE CULTURE: Culture: NO GROWTH

## 2021-03-12 LAB — MAGNESIUM: Magnesium: 2.2 mg/dL (ref 1.7–2.4)

## 2021-03-12 MED ORDER — SODIUM CHLORIDE 0.9 % IV SOLN: INTRAVENOUS | Status: DC

## 2021-03-12 MED ORDER — MORPHINE SULFATE (PF) 2 MG/ML IV SOLN
0.5000 mg | Freq: Once | INTRAVENOUS | Status: AC
  Administered 2021-03-12: 0.5 mg via INTRAVENOUS
  Filled 2021-03-12: qty 1

## 2021-03-12 MED ORDER — SODIUM CHLORIDE 0.9 % IV BOLUS
1000.0000 mL | Freq: Once | INTRAVENOUS | Status: AC
  Administered 2021-03-12: 1000 mL via INTRAVENOUS

## 2021-03-12 MED ORDER — GUAIFENESIN-DM 100-10 MG/5ML PO SYRP
5.0000 mL | ORAL_SOLUTION | ORAL | Status: DC | PRN
  Administered 2021-03-12 – 2021-03-19 (×3): 5 mL via ORAL
  Filled 2021-03-12 (×3): qty 10

## 2021-03-12 MED ORDER — MORPHINE SULFATE (PF) 2 MG/ML IV SOLN: 1.0000 mg | Freq: Once | INTRAVENOUS | Status: DC

## 2021-03-12 MED ORDER — IPRATROPIUM-ALBUTEROL 0.5-2.5 (3) MG/3ML IN SOLN
3.0000 mL | Freq: Three times a day (TID) | RESPIRATORY_TRACT | Status: DC
  Administered 2021-03-12 – 2021-03-19 (×21): 3 mL via RESPIRATORY_TRACT
  Filled 2021-03-12 (×18): qty 3

## 2021-03-12 NOTE — Progress Notes (Signed)
Patient sitting up in chair, notified by nurse tech that patient felt weak and dizzy, BP 80/50 on dinamap.  RN assisted tech to place patient back in bed.  Patient noted to be very pale, answered all questions appropriately but stated he felt very wiped out.  BP 84/52 manual by RN.  MD notified.  New orders received.

## 2021-03-12 NOTE — Progress Notes (Signed)
Pt wearing home cpap with o2 bed in.

## 2021-03-12 NOTE — Progress Notes (Signed)
Patient c/o being more short of breath, Albuterol neb given as per prn order.  Patient voiced little relief after neb treatment.  Patient with respirations 26-28 per minute, 95% on 4 liters but does appear to have increased work of breathing.  MD notified.  New orders received.

## 2021-03-12 NOTE — Progress Notes (Signed)
SATURATION QUALIFICATIONS: (This note is used to comply with regulatory documentation for home oxygen)  Patient Saturations on Room Air at Rest = 85%  Patient Saturations on Room Air while Ambulating = 75%  Patient Saturations on 4 Liters of oxygen while Ambulating = 95%  Please briefly explain why patient needs home oxygen: patient desats with rest and exertion without oxygen

## 2021-03-12 NOTE — Evaluation (Addendum)
Clinical/Bedside Swallow Evaluation Patient Details  Name: [name] MRN: [id number] Date of Birth: [birth year]  Today's Date: 03/12/2021 Time: SLP Start Time (ACUTE ONLY): 29 SLP Stop Time (ACUTE ONLY): 1345 SLP Time Calculation (min) (ACUTE ONLY): 35 min  Past Medical History:  Past Medical History:  Diagnosis Date   Cancer ([place])    Elevated diaphragm    Gout    History of prostate cancer Feb 1993   Hyperlipidemia    Hypertension    Hypoxemia    Normocytic anemia    OSA (obstructive sleep apnea)    Peripheral neuropathy    Pneumonia    Vision abnormalities    Past Surgical History:  Past Surgical History:  Procedure Laterality Date   APPENDECTOMY     PROSTATECTOMY     SHOULDER SURGERY     bone spur removed from right   HPI:  Per MD note:  "Patient is a pleasant 75 year old gentleman history of hypertension, hyperlipidemia, neuropathy, OSA presented with dyspnea, confusion.  It was noted that 3 days prior to admission patient's family noticed patient was fatigue, decreased appetite, no nausea or vomiting, development of generalized weakness with ambulation and noted to be hypoxic.  Patient noted to have sats down to 89 to 90%, seemed disoriented and subsequently EMS called.  Patient brought to the ED found to be hypoxic, chest x-ray with bibasilar atelectasis versus infiltrate, noted to have a fever, COVID-19 PCR/flu negative.  Patient placed on oxygen, started on IV Rocephin, hospitalist called to admit."    CT head negative for acute change.  CT of chest 03/12/2021 1.  No evidence of pulmonary embolism.  2. Small left pleural effusion and bibasilar atelectasis/scarring.  Pt is on 4 liters of oxygen at this time and uses night time CPAP at home.  He does have remote h/o pna and has been seen by Dr Halford Chessman and Dr Melvyn Novas.  Chronic sinusitis noted on maxillofacial imaging.    Pt denies dypshagia nor refluxing.  Today pt also with episode of becoming dizzy and having hypotension.  Since  this time, he has stabilized.   Assessment / Plan / Recommendation Clinical Impression  Pt with adequate oropharyngeal swallow based on clinical swallow evaluation. No indication of aspiration across all po consumed including 3 ounces water, 1 ounce tea, 4 ounces pudding, and 2 packets of saltine crackers. No dyspnea, wet voice nor indication of dysphagia.  Pt easily passed 3 ounce Yale water challenge as well.   Pt denies any dysphagia/reflux symptoms.  Advised he continue diet but rest if dyspenic due to possibly dysynchrony with breathing/swallowing.  Further recommend he stay upright after meals.    Of note, per imaging review, pt was noted to have "There is mucosal thickening within the right maxillary sinus, chronic sinusitis" in 2017.  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  Pt denies CVA history.   No SLP follow up indicated.  Thanks for this consult. SLP Visit Diagnosis: Dysphagia, unspecified (R13.10)    Aspiration Risk  Mild aspiration risk    Diet Recommendation Regular;Thin liquid   Liquid Administration via: Cup;Straw Medication Administration: Whole meds with liquid Supervision: Patient able to self feed Compensations: Slow rate;Small sips/bites Postural Changes: Seated upright at 90 degrees;Remain upright for at least 30 minutes after po intake    Other  Recommendations Oral Care Recommendations: Oral care BID   Follow up Recommendations None      Frequency and Duration  N/a       Prognosis   N/a     Swallow Study   General Date of Onset: 03/12/21 HPI: Per MD note:  "Patient is a pleasant 75 year old gentleman history of hypertension, hyperlipidemia, neuropathy, OSA presented with dyspnea, confusion.  It was noted that 3 days prior to admission patient's family noticed patient was fatigue, decreased appetite, no nausea or vomiting, development of generalized weakness with  ambulation and noted to be hypoxic.  Patient noted to have sats down to 89 to 90%, seemed disoriented and subsequently EMS called.  Patient brought to the ED found to be hypoxic, chest x-ray with bibasilar atelectasis versus infiltrate, noted to have a fever, COVID-19 PCR/flu negative.  Patient placed on oxygen, started on IV Rocephin, hospitalist called to admit."    CT head negative for acute change.  CT of chest 03/12/2021 1.  No evidence of pulmonary embolism.  2. Small left pleural effusion and bibasilar atelectasis/scarring.  Pt is on 4 liters of oxygen at this time and uses night time CPAP at home.  He does have remote h/o pna and has been seen by Dr Halford Chessman and Dr Melvyn Novas.  Chronic sinusitis noted on maxillofacial imaging.    Pt denies dypshagia nor refluxing.  Today pt also with episode of becoming dizzy and having hypotension.  Since this time, he has stabilized. Type of Study: Bedside Swallow Evaluation Diet Prior to this Study: Regular;Thin liquids Temperature Spikes Noted: No Respiratory Status: Nasal cannula History of Recent Intubation: No Behavior/Cognition: Alert;Cooperative;Pleasant mood Oral Cavity Assessment: Within Functional Limits Oral Care Completed by SLP: No Oral Cavity - Dentition: Adequate natural dentition Vision: Functional for self-feeding Self-Feeding Abilities: Able to feed self Patient Positioning: Upright in bed Baseline Vocal Quality: Normal Volitional Cough: Strong Volitional Swallow: Able to elicit    Oral/Motor/Sensory Function Overall Oral Motor/Sensory Function: Within functional limits   Ice Chips Ice chips: Not tested   Thin Liquid Thin Liquid: Within functional limits Presentation: Cup;Self Fed Other Comments: 3 ounce Yale water challenge easily passed    Nectar Thick Nectar Thick Liquid: Not tested   Honey Thick Honey Thick Liquid: Not tested   Puree Puree: Within functional limits Presentation: Self Fed;Spoon   Solid     Solid: Within functional  limits Presentation: Self Fed;Spoon      [name] 03/12/2021,2:44 PM  Glenn Lime, MS [facility] SLP [place] Office [phone] Pager [phone]

## 2021-03-12 NOTE — Progress Notes (Signed)
Patient only voided once on 7 a to 7 p shift, denied feeling the need to void when asked.  Bladder scan performed, read as 383 mls.  Patient assisted up to [facility], attempted to void, passed approximately 50 mls urine then could pass no more.  Patient assisted back to bed, in and out cath performed as per protocol, obtained 300 mls amber urine without difficulty.  Will continue to monitor for retention.

## 2021-03-12 NOTE — Progress Notes (Addendum)
PROGRESS NOTE    [name]  [id number] DOB: [birth year] DOA: 03/09/2021 PCP: Deland Pretty, MD    Chief Complaint  Patient presents with   Dizziness   Weakness    Brief Narrative:  Patient is a very pleasant 75 year old gentleman history of hypertension, hyperlipidemia, neuropathy, OSA presented with dyspnea, confusion.  It was noted that 3 days prior to admission patient's family noticed patient was fatigue, decreased appetite, no nausea or vomiting, development of generalized weakness with ambulation and noted to be hypoxic.  Patient noted to have sats down to 89 to 90%, seemed disoriented and subsequently EMS called.  Patient brought to the ED found to be hypoxic, chest x-ray with bibasilar atelectasis versus infiltrate, noted to have a fever, COVID-19 PCR/flu negative.  Patient placed on oxygen, started on IV Rocephin, hospitalist called to admit.   Assessment & Plan:   Principal Problem:   Acute respiratory failure with hypoxia (HCC) Active Problems:   OSA (obstructive sleep apnea)   Essential hypertension   Hypercholesterolemia   Acute metabolic encephalopathy   CAP (community acquired pneumonia)   Acute renal failure superimposed on stage 3a chronic kidney disease (HCC)   Hypotension  #1 acute hypoxic respiratory failure secondary to probable CAP -Patient presented with confusion, generalized weakness, hypoxia, initially required 6 L high flow nasal cannula. -D-dimer noted to be elevated at 2.54. -Lactic acid level at 1.2. -Cardiac enzymes negative. -CT angiogram chest negative for PE with small left pleural effusion and bibasilar atelectasis/scarring. -Fever curve fluctuating patient with a temp as high as 103.2 at 1830 hrs. on 03/11/2021.  Patient with a temp of 100.2 this morning.  -WBC within normal limits. -Blood cultures pending. -Urine Legionella antigen negative. -Urine pneumococcus antigen negative. -Urine cultures with 10,000 colonies Enterococcus  faecalis. -Patient noted to be hypotensive this morning. -Repeat blood cultures x2, UA with cultures and sensitivities, chest x-ray. -IV antibiotic coverage was broadened yesterday evening to include vancomycin and IV Rocephin. -Continue Flonase, Mucinex, scheduled duo nebs, Claritin, PPI. -Supportive care.  2.  Hypotension -Patient noted to hypotensive today while sitting in the chair eating his breakfast with complaints of lightheadedness and dizziness. -Patient with dry mucous membranes. -Discontinue antihypertensive medications. -Repeat blood cultures x2, check UA with cultures and sensitivities. -1 L normal saline bolus. -Increase IV fluids to 125 cc an hour monitor volume status closely. -Continue empiric IV Rocephin and IV vancomycin.  3.  Acute metabolic encephalopathy -Likely secondary to problem #1. -Clinical improvement. -Patient alert and oriented x3.  -Patient pancultured with results pending.   -Repeat blood cultures, repeat UA, repeat chest x-ray as patient noted to be hypotensive.   -Continue empiric IV antibiotics of Rocephin and vancomycin.  -Supportive care.  4.  Hyperlipidemia -Statin.  5.  Hypertension -Patient now noted to be hypotensive with systolic in the [id number] per RN this morning and symptomatic with some lightheadedness and dizziness.   -Discontinue antihypertensive medications of labetalol, Plendil.  Continue to hold ARB.   6.-Acute kidney injury on chronic kidney disease stage IIIa -Likely secondary to prerenal azotemia in the setting of ARB, allopurinol, Mobic. -Renal ultrasound negative for hydronephrosis. -Renal function improved with hydration.   -1 L fluid bolus ordered as patient noted to be hypotensive with systolic in the [id number] and symptomatic.   -Increase IV fluids to 125 cc an hour.   -Monitor closely volume status with fluid resuscitation. -Follow.  7.  Gout -Allopurinol on hold due to AKI.   8.  OSA CPAP nightly.  Addendum #9  dyspnea -Was called by RN that patient with now complaints of shortness of breath. -.  Patient noted early on this morning to be hypotensive requiring fluid boluses. -Blood pressure borderline/soft. -Saline lock IV fluids.  Check  ABG, repeat chest x-ray. -Placed on scheduled DuoNebs.   DVT prophylaxis: Lovenox Code Status: Full Family Communication: Updated patient, wife, daughter at bedside. Disposition:   Status is: Inpatient  Remains inpatient appropriate because:Inpatient level of care appropriate due to severity of illness  Dispo: The patient is from: Home              Anticipated d/c is to: Home              Patient currently is not medically stable to d/c.   Difficult to place patient No       Consultants:  None  Procedures:  CT angiogram chest 03/11/2021 Chest x-ray 03/09/2021 CT head 03/09/2021 Renal ultrasound 03/10/2021  Antimicrobials:  IV Rocephin 03/10/2021>>>> IV vancomycin 03/11/2021>>>>>   Subjective: Patient back in bed.  Patient stated he ate breakfast okay while sitting up in chair started to feel lightheaded and dizzy and not feeling well.  Vitals obtained patient noted to be hypotensive with systolic blood pressures in the 70s to 80s.  Patient denies any chest pain.  Patient denies any significant shortness of breath.  No diarrhea, no dysuria, no abdominal pain, no chest pain.    Objective: Vitals:   03/12/21 1100 03/12/21 1308 03/12/21 1413 03/12/21 1518  BP: (!) 98/52 (!) 112/58 106/61 (!) 109/57  Pulse:   73   Resp:   (!) 28   Temp:   98 F (36.7 C)   TempSrc:   Oral   SpO2:   93%   Weight:      Height:        Intake/Output Summary (Last 24 hours) at 03/12/2021 1523 Last data filed at 03/12/2021 [id number] Gross per 24 hour  Intake 1188.87 ml  Output 600 ml  Net 588.87 ml   Filed Weights   03/09/21 2011 03/10/21 1113  Weight: 86.2 kg 82.8 kg    Examination:  General exam: Appears calm and comfortable .  Dry mucous membranes.   Pallor. Respiratory system: Bibasilar coarse breath sounds.  No wheezing noted.  Fair air movement.  Speaking in full sentences.  Cardiovascular system: Regular rate rhythm no murmurs rubs or gallops.  No JVD.  No lower extremity edema.  Gastrointestinal system: Abdomen is soft, obese, mildly distended, nontender to palpation.  Positive bowel sounds.  No rebound.  No guarding.  Central nervous system: Alert and oriented. No focal neurological deficits. Extremities: Symmetric 5 x 5 power. Skin: No rashes, lesions or ulcers Psychiatry: Judgement and insight appear normal. Mood & affect appropriate.     Data Reviewed: I have personally reviewed following labs and imaging studies  CBC: Recent Labs  Lab 03/09/21 2054 03/10/21 1415 03/11/21 0537 03/12/21 0452  WBC 7.7 6.8 6.5 5.2  NEUTROABS 6.3  --   --  4.1  HGB 12.2* 12.3* 11.8* 10.2*  HCT 35.6* 36.8* 35.9* 30.9*  MCV 91.0 93.2 93.0 94.8  PLT 136* 142* 126* 126*    Basic Metabolic Panel: Recent Labs  Lab 03/09/21 2054 03/10/21 1415 03/11/21 0537 03/12/21 0452  NA 135 137 135 135  K 4.7 4.5 4.3 4.0  CL 99 103 104 106  CO2 '28 26 26 24  '$ GLUCOSE 141* 150* 102* 107*  BUN 32* 29* 28* 29*  CREATININE 1.61*  1.37* 1.28* 1.27*  CALCIUM 8.6* 8.7* 8.4* 7.8*  MG  --   --  2.2 2.2    GFR: Estimated Creatinine Clearance: 52.7 mL/min (A) (by C-G formula based on SCr of 1.27 mg/dL (H)).  Liver Function Tests: Recent Labs  Lab 03/09/21 2054 03/10/21 1415 03/11/21 0537  AST '16 17 18  '$ ALT '11 13 13  '$ ALKPHOS 81 88 87  BILITOT 0.9 0.7 0.9  PROT 6.5 6.7 6.3*  ALBUMIN 3.7 3.7 3.4*    CBG: Recent Labs  Lab 03/11/21 2019  GLUCAP 180*     Recent Results (from the past 240 hour(s))  Resp Panel by RT-PCR (Flu A&B, Covid) Nasopharyngeal Swab     Status: None   Collection Time: 03/09/21 11:25 PM   Specimen: Nasopharyngeal Swab; Nasopharyngeal(NP) swabs in vial transport medium  Result Value Ref Range Status   SARS Coronavirus  2 by RT PCR NEGATIVE NEGATIVE Final    Comment: (NOTE) SARS-CoV-2 target nucleic acids are NOT DETECTED.  The SARS-CoV-2 RNA is generally detectable in upper respiratory specimens during the acute phase of infection. The lowest concentration of SARS-CoV-2 viral copies this assay can detect is 138 copies/mL. A negative result does not preclude SARS-Cov-2 infection and should not be used as the sole basis for treatment or other patient management decisions. A negative result may occur with  improper specimen collection/handling, submission of specimen other than nasopharyngeal swab, presence of viral mutation(s) within the areas targeted by this assay, and inadequate number of viral copies(<138 copies/mL). A negative result must be combined with clinical observations, patient history, and epidemiological information. The expected result is Negative.  Fact Sheet for Patients:  EntrepreneurPulse.com.au  Fact Sheet for Healthcare Providers:  IncredibleEmployment.be  This test is no t yet approved or cleared by the [place] FDA and  has been authorized for detection and/or diagnosis of SARS-CoV-2 by FDA under an Emergency Use Authorization (EUA). This EUA will remain  in effect (meaning this test can be used) for the duration of the COVID-19 declaration under Section 564(b)(1) of the Act, 21 U.S.C.section 360bbb-3(b)(1), unless the authorization is terminated  or revoked sooner.       Influenza A by PCR NEGATIVE NEGATIVE Final   Influenza B by PCR NEGATIVE NEGATIVE Final    Comment: (NOTE) The Xpert Xpress SARS-CoV-2/FLU/RSV plus assay is intended as an aid in the diagnosis of influenza from Nasopharyngeal swab specimens and should not be used as a sole basis for treatment. Nasal washings and aspirates are unacceptable for Xpert Xpress SARS-CoV-2/FLU/RSV testing.  Fact Sheet for Patients: EntrepreneurPulse.com.au  Fact  Sheet for Healthcare Providers: IncredibleEmployment.be  This test is not yet approved or cleared by the [place] FDA and has been authorized for detection and/or diagnosis of SARS-CoV-2 by FDA under an Emergency Use Authorization (EUA). This EUA will remain in effect (meaning this test can be used) for the duration of the COVID-19 declaration under Section 564(b)(1) of the Act, 21 U.S.C. section 360bbb-3(b)(1), unless the authorization is terminated or revoked.  Performed at [facility], [address] [place], [place] [address]   Urine Culture     Status: Abnormal (Preliminary result)   Collection Time: 03/10/21  8:00 AM   Specimen: Urine, Clean Catch  Result Value Ref Range Status   Specimen Description   Final    URINE, CLEAN CATCH Performed at [facility], [address], [place], [place] [address]    Special Requests   Final  NONE Performed at [facility], [address] [place], [place] [address]    Culture (A)  Final    10,000 COLONIES/mL ENTEROCOCCUS FAECALIS SUSCEPTIBILITIES TO FOLLOW Performed at [facility], [place] [address], [place], [place] [address]    Report Status PENDING  Incomplete  Urine Culture     Status: None   Collection Time: 03/10/21  1:55 PM   Specimen: Urine, Clean Catch  Result Value Ref Range Status   Specimen Description   Final    URINE, CLEAN CATCH Performed at [facility], [place] [address], [place], [place] [address]    Special Requests   Final    NONE Performed at [facility], [place] [address] [place], [place] [address]    Culture   Final    NO GROWTH Performed at [facility], [place] [address] [place], [place] [address]    Report Status 03/12/2021 FINAL  Final         Radiology Studies: CT Angio Chest Pulmonary Embolism (PE) W or WO Contrast  Result Date: 03/11/2021 CLINICAL DATA:  Dyspnea, confusion, turn for pulse  very embolism. EXAM: CT ANGIOGRAPHY CHEST WITH CONTRAST TECHNIQUE: Multidetector CT imaging of the chest was performed using the standard protocol during bolus administration of intravenous contrast. Multiplanar CT image reconstructions and MIPs were obtained to evaluate the vascular anatomy. CONTRAST:  72m OMNIPAQUE IOHEXOL 350 MG/ML SOLN COMPARISON:  Chest CT dated 10/16/2018 and chest radiograph dated 03/09/2021. FINDINGS: Cardiovascular: Satisfactory opacification of the pulmonary arteries to the segmental level. No evidence of pulmonary embolism. Vascular calcifications are seen in the coronary arteries and aortic arch. Normal heart size. No pericardial effusion. Mediastinum/Nodes: Prominent mediastinal lymph nodes are not changed since at least 2016. No enlarged hilar or axillary lymph nodes. Thyroid gland, trachea, and esophagus demonstrate no significant findings. Lungs/Pleura: Moderate bibasilar atelectasis/scarring is increased since 10/16/2018. There is a small left pleural effusion. No pleural effusion on the right or pneumothorax on either side. Upper Abdomen: A cyst in the liver measures 2.3 cm, unchanged. Musculoskeletal: Degenerative changes are seen in the spine. Review of the MIP images confirms the above findings. IMPRESSION: 1. No evidence of pulmonary embolism. 2. Small left pleural effusion and bibasilar atelectasis/scarring. Aortic Atherosclerosis (ICD10-I70.0). Electronically Signed   By: TZerita BoersM.D.   On: 03/11/2021 10:37   [place]  Result Date: 03/10/2021 CLINICAL DATA:  Acute renal injury EXAM: RENAL / URINARY TRACT ULTRASOUND COMPLETE COMPARISON:  02/07/2011 FINDINGS: Right Kidney: Renal measurements: 11.3 x 4.2 x 7.5 cm. = volume: 189 mL. Echogenicity within normal limits. No mass or hydronephrosis visualized. Left Kidney: Renal measurements: 12.4 x 5.5 x 6.8 cm. = volume: 240 mL. Echogenicity within normal limits. No mass or hydronephrosis visualized. Bladder: Decompressed  Other: None. IMPRESSION: Normal appearing kidneys bilaterally. Previously seen right renal cyst is not well appreciated on this exam. Electronically Signed   By: MInez CatalinaM.D.   On: 03/10/2021 19:16        Scheduled Meds:  enoxaparin (LOVENOX) injection  40 mg Subcutaneous Q24H   fluticasone  2 spray Each Nare Daily   guaiFENesin  1,200 mg Oral BID   ipratropium-albuterol  3 mL Nebulization TID   lamoTRIgine  300 mg Oral BID   loratadine  10 mg Oral Daily   minoxidil  40 mg Oral Daily   pantoprazole  40 mg Oral Q0600   prednisoLONE acetate  1 drop Left Eye QHS  primidone  50 mg Oral BID   simvastatin  20 mg Oral Daily   Continuous Infusions:  cefTRIAXone (ROCEPHIN)  IV 2 g (03/12/21 0926)   vancomycin       LOS: 2 days    Time spent: 40 minutes    Irine Seal, MD [facility]   To contact the attending provider between 7A-7P or the covering provider during after hours 7P-7A, please log into the web site [email or web address] and access using universal [place] password for that web site. If you do not have the password, please call the hospital operator.  03/12/2021, 3:23 PM

## 2021-03-13 LAB — URINE CULTURE
Culture: 10000 — AB
Culture: NO GROWTH

## 2021-03-13 LAB — CBC WITH DIFFERENTIAL/PLATELET
Abs Immature Granulocytes: 0.04 10*3/uL (ref 0.00–0.07)
Basophils Absolute: 0 10*3/uL (ref 0.0–0.1)
Basophils Relative: 1 %
Eosinophils Absolute: 0.2 10*3/uL (ref 0.0–0.5)
Eosinophils Relative: 2 %
HCT: 30.6 % — ABNORMAL LOW (ref 39.0–52.0)
Hemoglobin: 10.3 g/dL — ABNORMAL LOW (ref 13.0–17.0)
Immature Granulocytes: 1 %
Lymphocytes Relative: 9 %
Lymphs Abs: 0.6 10*3/uL — ABNORMAL LOW (ref 0.7–4.0)
MCH: 30.9 pg (ref 26.0–34.0)
MCHC: 33.7 g/dL (ref 30.0–36.0)
MCV: 91.9 fL (ref 80.0–100.0)
Monocytes Absolute: 0.5 10*3/uL (ref 0.1–1.0)
Monocytes Relative: 7 %
Neutro Abs: 5.4 10*3/uL (ref 1.7–7.7)
Neutrophils Relative %: 80 %
Platelets: 126 10*3/uL — ABNORMAL LOW (ref 150–400)
RBC: 3.33 MIL/uL — ABNORMAL LOW (ref 4.22–5.81)
RDW: 14.1 % (ref 11.5–15.5)
WBC: 6.6 10*3/uL (ref 4.0–10.5)
nRBC: 0 % (ref 0.0–0.2)

## 2021-03-13 LAB — RENAL FUNCTION PANEL
Albumin: 3 g/dL — ABNORMAL LOW (ref 3.5–5.0)
Anion gap: 6 (ref 5–15)
BUN: 26 mg/dL — ABNORMAL HIGH (ref 8–23)
CO2: 24 mmol/L (ref 22–32)
Calcium: 8.3 mg/dL — ABNORMAL LOW (ref 8.9–10.3)
Chloride: 106 mmol/L (ref 98–111)
Creatinine, Ser: 1.09 mg/dL (ref 0.61–1.24)
GFR, Estimated: >60 mL/min (ref >60)
Glucose, Bld: 114 mg/dL — ABNORMAL HIGH (ref 70–99)
Phosphorus: 2.9 mg/dL (ref 2.5–4.6)
Potassium: 4 mmol/L (ref 3.5–5.1)
Sodium: 136 mmol/L (ref 135–145)

## 2021-03-13 LAB — MRSA NEXT GEN BY PCR, NASAL: MRSA by PCR Next Gen: NOT DETECTED

## 2021-03-13 LAB — BRAIN NATRIURETIC PEPTIDE: B Natriuretic Peptide: 400 pg/mL — ABNORMAL HIGH (ref 0.0–100.0)

## 2021-03-13 LAB — LEGIONELLA PNEUMOPHILA SEROGP 1 UR AG: L. pneumophila Serogp 1 Ur Ag: NEGATIVE

## 2021-03-13 MED ORDER — CALCIUM POLYCARBOPHIL 625 MG PO TABS
625.0000 mg | ORAL_TABLET | Freq: Every day | ORAL | Status: DC
  Administered 2021-03-14 – 2021-03-20 (×7): 625 mg via ORAL
  Filled 2021-03-13 (×7): qty 1

## 2021-03-13 MED ORDER — FUROSEMIDE 10 MG/ML IJ SOLN: 40.0000 mg | Freq: Once | INTRAMUSCULAR | Status: DC

## 2021-03-13 MED ORDER — FUROSEMIDE 10 MG/ML IJ SOLN
20.0000 mg | Freq: Once | INTRAMUSCULAR | Status: AC
  Administered 2021-03-13: 20 mg via INTRAVENOUS
  Filled 2021-03-13: qty 2

## 2021-03-13 MED ORDER — PRAZOSIN HCL 1 MG PO CAPS
1.0000 mg | ORAL_CAPSULE | Freq: Every day | ORAL | Status: DC
  Administered 2021-03-13 – 2021-03-19 (×7): 1 mg via ORAL
  Filled 2021-03-13 (×7): qty 1

## 2021-03-13 MED ORDER — FIBER PO CAPS: 1.0000 | ORAL_CAPSULE | Freq: Every day | ORAL | Status: DC

## 2021-03-13 NOTE — Progress Notes (Signed)
Patient unable to void, intermittent cath done and it drained about 420 mls of clear urine.

## 2021-03-13 NOTE — Progress Notes (Addendum)
PROGRESS NOTE    [name]  [id number] DOB: 1946-01-05 DOA: 03/09/2021 PCP: Deland Pretty, MD    Chief Complaint  Patient presents with   Dizziness   Weakness    Brief Narrative:  Patient is a very pleasant 75 year old gentleman history of hypertension, hyperlipidemia, neuropathy, OSA presented with dyspnea, confusion.  It was noted that 3 days prior to admission patient's family noticed patient was fatigue, decreased appetite, no nausea or vomiting, development of generalized weakness with ambulation and noted to be hypoxic.  Patient noted to have sats down to 89 to 90%, seemed disoriented and subsequently EMS called.  Patient brought to the ED found to be hypoxic, chest x-ray with bibasilar atelectasis versus infiltrate, noted to have a fever, COVID-19 PCR/flu negative.  Patient placed on oxygen, started on IV Rocephin, hospitalist called to admit.   Assessment & Plan:   Principal Problem:   Acute respiratory failure with hypoxia (HCC) Active Problems:   OSA (obstructive sleep apnea)   Essential hypertension   Hypercholesterolemia   Acute metabolic encephalopathy   CAP (community acquired pneumonia)   Acute renal failure superimposed on stage 3a chronic kidney disease (HCC)   Hypotension   acute hypoxic respiratory failure secondary to probable CAP -Patient presented with confusion, generalized weakness, hypoxia, initially required 6 L high flow nasal cannula. -D-dimer noted to be elevated at 2.54. -CT angiogram chest negative for PE with small left pleural effusion and bibasilar atelectasis/scarring. -Fever curve fluctuating patient with a temp as high as 103.2 at 1830 hrs. on 03/11/2021.  Patient with a temp of 100.2 this morning.  -WBC within normal limits. -Blood cultures NGTD -Urine Legionella antigen negative. -Urine pneumococcus antigen negative. -Urine cultures with 10,000 colonies Enterococcus faecalis. -vancomycin and IV Rocephin. -aggressive pulm  toilet -may need pulm consult if not improved -BNP elevated, x ray shows worsening atelectasis/infiltrates-- last echo showed EF preserved but grade 2 diastolic dfxn - IV lasix x 1  Hypotension -resolved, monitor  Acute metabolic encephalopathy -avoid morphine  Hyperlipidemia -Statin.  Hypertension -monitor with diuresis  chronic kidney disease stage IIIa -daily labs  Gout -Allopurinol on hold due to AKI.   OSA CPAP nightly.  Urinary retention -may need short term foley- but for now I/O -bowel regimen -prazosin started as allergy to sulfa   DVT prophylaxis: Lovenox Code Status: Full Family Communication: Updated patient, wife, daughter at bedside. Disposition:   Status is: Inpatient  Remains inpatient appropriate because:Inpatient level of care appropriate due to severity of illness  Dispo: The patient is from: Home              Anticipated d/c is to: Home              Patient currently is not medically stable to d/c.   Difficult to place patient No       Consultants:  None  Procedures:  CT angiogram chest 03/11/2021 Chest x-ray 03/09/2021 CT head 03/09/2021 Renal ultrasound 03/10/2021  Antimicrobials:  IV Rocephin 03/10/2021>>>> IV vancomycin 03/11/2021>>>>>   Subjective: Had morphine last night and is still "wonky" from that  Objective: Vitals:   03/13/21 1150 03/13/21 1233 03/13/21 1305 03/13/21 1350  BP:   (!) 181/68 138/60  Pulse:   90 84  Resp:   20   Temp:   98.6 F (37 C) 98.6 F (37 C)  TempSrc:    Oral  SpO2: 94% 91% 94% 94%  Weight:      Height:  Intake/Output Summary (Last 24 hours) at 03/13/2021 1357 Last data filed at 03/13/2021 1339 Gross per 24 hour  Intake 1215.7 ml  Output 722 ml  Net 493.7 ml   Filed Weights   03/09/21 2011 03/10/21 1113  Weight: 86.2 kg 82.8 kg    Examination:  General: Appearance:     Overweight male in no acute distress     Lungs:     Lungs clear at top but rhochus in middle/bottom,  respirations unlabored  Heart:    Normal heart rate. Normal rhythm. No murmurs, rubs, or gallops.    MS:   All extremities are intact.    Neurologic:   Awake, alert, oriented x 3. No apparent focal neurological           defect.        Data Reviewed: I have personally reviewed following labs and imaging studies  CBC: Recent Labs  Lab 03/09/21 2054 03/10/21 1415 03/11/21 0537 03/12/21 0452 03/13/21 0413  WBC 7.7 6.8 6.5 5.2 6.6  NEUTROABS 6.3  --   --  4.1 5.4  HGB 12.2* 12.3* 11.8* 10.2* 10.3*  HCT 35.6* 36.8* 35.9* 30.9* 30.6*  MCV 91.0 93.2 93.0 94.8 91.9  PLT 136* 142* 126* 126* 126*    Basic Metabolic Panel: Recent Labs  Lab 03/09/21 2054 03/10/21 1415 03/11/21 0537 03/12/21 0452 03/13/21 0413  NA 135 137 135 135 136  K 4.7 4.5 4.3 4.0 4.0  CL 99 103 104 106 106  CO2 '28 26 26 24 24  '$ GLUCOSE 141* 150* 102* 107* 114*  BUN 32* 29* 28* 29* 26*  CREATININE 1.61* 1.37* 1.28* 1.27* 1.09  CALCIUM 8.6* 8.7* 8.4* 7.8* 8.3*  MG  --   --  2.2 2.2  --   PHOS  --   --   --   --  2.9    GFR: Estimated Creatinine Clearance: 61.4 mL/min (by C-G formula based on SCr of 1.09 mg/dL).  Liver Function Tests: Recent Labs  Lab 03/09/21 2054 03/10/21 1415 03/11/21 0537 03/13/21 0413  AST '16 17 18  '$ --   ALT '11 13 13  '$ --   ALKPHOS 81 88 87  --   BILITOT 0.9 0.7 0.9  --   PROT 6.5 6.7 6.3*  --   ALBUMIN 3.7 3.7 3.4* 3.0*    CBG: Recent Labs  Lab 03/11/21 2019  GLUCAP 180*     Recent Results (from the past 240 hour(s))  Resp Panel by RT-PCR (Flu A&B, Covid) Nasopharyngeal Swab     Status: None   Collection Time: 03/09/21 11:25 PM   Specimen: Nasopharyngeal Swab; Nasopharyngeal(NP) swabs in vial transport medium  Result Value Ref Range Status   SARS Coronavirus 2 by RT PCR NEGATIVE NEGATIVE Final    Comment: (NOTE) SARS-CoV-2 target nucleic acids are NOT DETECTED.  The SARS-CoV-2 RNA is generally detectable in upper respiratory specimens during the acute  phase of infection. The lowest concentration of SARS-CoV-2 viral copies this assay can detect is 138 copies/mL. A negative result does not preclude SARS-Cov-2 infection and should not be used as the sole basis for treatment or other patient management decisions. A negative result may occur with  improper specimen collection/handling, submission of specimen other than nasopharyngeal swab, presence of viral mutation(s) within the areas targeted by this assay, and inadequate number of viral copies(<138 copies/mL). A negative result must be combined with clinical observations, patient history, and epidemiological information. The expected result is Negative.  Fact Sheet for Patients:  EntrepreneurPulse.com.au  Fact Sheet for Healthcare Providers:  IncredibleEmployment.be  This test is no t yet approved or cleared by the [place] FDA and  has been authorized for detection and/or diagnosis of SARS-CoV-2 by FDA under an Emergency Use Authorization (EUA). This EUA will remain  in effect (meaning this test can be used) for the duration of the COVID-19 declaration under Section 564(b)(1) of the Act, 21 U.S.C.section 360bbb-3(b)(1), unless the authorization is terminated  or revoked sooner.       Influenza A by PCR NEGATIVE NEGATIVE Final   Influenza B by PCR NEGATIVE NEGATIVE Final    Comment: (NOTE) The Xpert Xpress SARS-CoV-2/FLU/RSV plus assay is intended as an aid in the diagnosis of influenza from Nasopharyngeal swab specimens and should not be used as a sole basis for treatment. Nasal washings and aspirates are unacceptable for Xpert Xpress SARS-CoV-2/FLU/RSV testing.  Fact Sheet for Patients: EntrepreneurPulse.com.au  Fact Sheet for Healthcare Providers: IncredibleEmployment.be  This test is not yet approved or cleared by the [place] FDA and has been authorized for detection and/or diagnosis of  SARS-CoV-2 by FDA under an Emergency Use Authorization (EUA). This EUA will remain in effect (meaning this test can be used) for the duration of the COVID-19 declaration under Section 564(b)(1) of the Act, 21 U.S.C. section 360bbb-3(b)(1), unless the authorization is terminated or revoked.  Performed at [facility], [address] [place], [place] [address]   Urine Culture     Status: Abnormal   Collection Time: 03/10/21  8:00 AM   Specimen: Urine, Clean Catch  Result Value Ref Range Status   Specimen Description   Final    URINE, CLEAN CATCH Performed at [facility], [address] [place], [place] [address]    Special Requests   Final    NONE Performed at [facility], [address] [place], [place] [address]    Culture 10,000 COLONIES/mL ENTEROCOCCUS FAECALIS (A)  Final   Report Status 03/13/2021 FINAL  Final   Organism ID, Bacteria ENTEROCOCCUS FAECALIS (A)  Final      Susceptibility   Enterococcus faecalis - MIC*    AMPICILLIN <=2 SENSITIVE Sensitive     NITROFURANTOIN <=16 SENSITIVE Sensitive     VANCOMYCIN 1 SENSITIVE Sensitive     * 10,000 COLONIES/mL ENTEROCOCCUS FAECALIS  Urine Culture     Status: None   Collection Time: 03/10/21  1:55 PM   Specimen: Urine, Clean Catch  Result Value Ref Range Status   Specimen Description   Final    URINE, CLEAN CATCH Performed at [facility], [place] [address] [place], [place] [address]    Special Requests   Final    NONE Performed at [facility], [place] [address], [place], [place] [address]    Culture   Final    NO GROWTH Performed at [facility], [place] [address] [place], [place] [address]    Report Status 03/12/2021 FINAL  Final  Urine Culture     Status: None   Collection Time: 03/12/21  1:08 PM   Specimen: Urine, Catheterized  Result Value Ref Range Status   Specimen Description   Final    URINE, CATHETERIZED Performed at [place] [address] [place], [place] [address]    Special Requests   Final    NONE Performed at [facility], [place] [address] [place], [place] [address]    Culture  Final    NO GROWTH Performed at [facility], [place] [address] [place], [place] [address]    Report Status 03/13/2021 FINAL  Final  MRSA Next Gen by PCR, Nasal     Status: None   Collection Time: 03/13/21  9:08 AM   Specimen: Nasal Mucosa; Nasal Swab  Result Value Ref Range Status   MRSA by PCR Next Gen NOT DETECTED NOT DETECTED Final    Comment: (NOTE) The GeneXpert MRSA Assay (FDA approved for NASAL specimens only), is one component of a comprehensive MRSA colonization surveillance program. It is not intended to diagnose MRSA infection nor to guide or monitor treatment for MRSA infections. Test performance is not FDA approved in patients less than 77 years old. Performed at [facility], [place] [address], [place], [place] [address]          Radiology Studies: DG CHEST PORT 1 VIEW  Result Date: 03/12/2021 CLINICAL DATA:  75 year old male with fatigue and shortness of breath. EXAM: PORTABLE CHEST - 1 VIEW COMPARISON:  03/11/2021 FINDINGS: The mediastinal contours are within normal limits. No cardiomegaly. Worsening bibasilar streaky opacities and blunting of the left costophrenic angle. Similar appearing elevation of the left hemidiaphragm. No acute osseous abnormality. IMPRESSION: Worsening bibasilar subsegmental atelectasis and trace left pleural effusion. Electronically Signed   By: Ruthann Cancer MD   On: 03/12/2021 15:47        Scheduled Meds:  enoxaparin (LOVENOX) injection  40 mg Subcutaneous Q24H   fluticasone  2 spray Each Nare Daily   furosemide  20 mg Intravenous Once   guaiFENesin  1,200 mg Oral BID   ipratropium-albuterol  3 mL Nebulization TID   lamoTRIgine  300 mg Oral BID   loratadine  10 mg Oral Daily   minoxidil  40 mg Oral Daily    pantoprazole  40 mg Oral Q0600   prednisoLONE acetate  1 drop Left Eye QHS   primidone  50 mg Oral BID   simvastatin  20 mg Oral Daily   Continuous Infusions:  cefTRIAXone (ROCEPHIN)  IV 2 g (03/13/21 0932)   vancomycin 1,500 mg (03/12/21 2027)     LOS: 3 days    Time spent: 40 minutes    Geradine Girt, DO [facility]   To contact the attending provider between 7A-7P or the covering provider during after hours 7P-7A, please log into the web site [email or web address] and access using universal [place] password for that web site. If you do not have the password, please call the hospital operator.  03/13/2021, 1:57 PM

## 2021-03-13 NOTE — Progress Notes (Signed)
Pt noted to have increases work of breathing and oxygen saturations on 6 l [place] 80s. Resp. Therapy in room to assist and pt now changed to 15 L  [place] with noted improvements of oxygen saturations 95%. MD via phone upated

## 2021-03-13 NOTE — Care Management Important Message (Signed)
Important Message  Patient Details IM Letter given to the Patient. Name: [name] MRN: [id number] Date of Birth: November 28, 1945   Medicare Important Message Given:  Yes     Kerin Salen 03/13/2021, 3:43 PM

## 2021-03-13 NOTE — Progress Notes (Signed)
Post neb treatment , the patient had an in creased WOB. He was currently on 6lnc. Patient switched to 15L [place] with saturation=95%. Patient states that after he eats breakfast he will go back on his CPAP if needed. RT will continue to monitor.

## 2021-03-13 NOTE — Progress Notes (Signed)
Chest vest set up and patient tolerated well

## 2021-03-14 ENCOUNTER — Inpatient Hospital Stay (HOSPITAL_COMMUNITY): Payer: Medicare PPO

## 2021-03-14 LAB — BASIC METABOLIC PANEL
Anion gap: 8 (ref 5–15)
BUN: 31 mg/dL — ABNORMAL HIGH (ref 8–23)
CO2: 22 mmol/L (ref 22–32)
Calcium: 8.2 mg/dL — ABNORMAL LOW (ref 8.9–10.3)
Chloride: 106 mmol/L (ref 98–111)
Creatinine, Ser: 1.33 mg/dL — ABNORMAL HIGH (ref 0.61–1.24)
GFR, Estimated: 55 mL/min — ABNORMAL LOW (ref >60)
Glucose, Bld: 115 mg/dL — ABNORMAL HIGH (ref 70–99)
Potassium: 3.6 mmol/L (ref 3.5–5.1)
Sodium: 136 mmol/L (ref 135–145)

## 2021-03-14 LAB — CBC
HCT: 28.6 % — ABNORMAL LOW (ref 39.0–52.0)
Hemoglobin: 9.6 g/dL — ABNORMAL LOW (ref 13.0–17.0)
MCH: 31.3 pg (ref 26.0–34.0)
MCHC: 33.6 g/dL (ref 30.0–36.0)
MCV: 93.2 fL (ref 80.0–100.0)
Platelets: 136 10*3/uL — ABNORMAL LOW (ref 150–400)
RBC: 3.07 MIL/uL — ABNORMAL LOW (ref 4.22–5.81)
RDW: 14.3 % (ref 11.5–15.5)
WBC: 6.2 10*3/uL (ref 4.0–10.5)
nRBC: 0 % (ref 0.0–0.2)

## 2021-03-14 NOTE — Plan of Care (Signed)

## 2021-03-14 NOTE — Progress Notes (Signed)
PROGRESS NOTE    [name]  [id number] DOB: [birth year] DOA: 03/09/2021 PCP: Deland Pretty, MD    Chief Complaint  Patient presents with   Dizziness   Weakness    Brief Narrative:  Patient is a very pleasant 75 year old gentleman history of hypertension, hyperlipidemia, neuropathy, OSA presented with dyspnea, confusion.  It was noted that 3 days prior to admission patient's family noticed patient was fatigue, decreased appetite, no nausea or vomiting, development of generalized weakness with ambulation and noted to be hypoxic.  Patient noted to have sats down to 89 to 90%, seemed disoriented and subsequently EMS called.  Patient brought to the ED found to be hypoxic, chest x-ray with bibasilar atelectasis versus infiltrate, noted to have a fever, COVID-19 PCR/flu negative.  Patient placed on oxygen, started on IV Rocephin, hospitalist called to admit.   Assessment & Plan:   Principal Problem:   Acute respiratory failure with hypoxia (HCC) Active Problems:   OSA (obstructive sleep apnea)   Essential hypertension   Hypercholesterolemia   Acute metabolic encephalopathy   CAP (community acquired pneumonia)   Acute renal failure superimposed on stage 3a chronic kidney disease (HCC)   Hypotension   acute hypoxic respiratory failure secondary to probable CAP -Patient presented with confusion, generalized weakness, hypoxia, initially required 6 L high flow nasal cannula. -D-dimer noted to be elevated at 2.54. -CT angiogram chest negative for PE with small left pleural effusion and bibasilar atelectasis/scarring. -WBC within normal limits. -Blood cultures NGTD -Urine Legionella antigen negative. -Urine pneumococcus antigen negative. -Urine cultures with 10,000 colonies Enterococcus faecalis. -vancomycin and IV Rocephin. -aggressive pulm toilet -may need pulm consult if not improved -BNP elevated, x ray shows worsening atelectasis/infiltrates-- last echo showed EF preserved  but grade 2 diastolic dfxn - IV lasix x 1- good UOP -recheck x ray today-- 3 view  Hypotension -resolved, monitor  Acute metabolic encephalopathy -avoid morphine  Hyperlipidemia -Statin.  Hypertension -monitor with diuresis  chronic kidney disease stage IIIa -daily labs  Gout -Allopurinol on hold due to AKI.   OSA CPAP nightly.  Urinary retention -resolved   DVT prophylaxis: Lovenox Code Status: Full Family Communication: Updated patient, wife, daughter at bedside. Disposition:   Status is: Inpatient  Remains inpatient appropriate because:Inpatient level of care appropriate due to severity of illness  Dispo: The patient is from: Home              Anticipated d/c is to: Home              Patient currently is not medically stable to d/c.   Difficult to place patient No       Consultants:  None  Procedures:  CT angiogram chest 03/11/2021 Chest x-ray 03/09/2021 CT head 03/09/2021 Renal ultrasound 03/10/2021  Antimicrobials:  IV Rocephin 03/10/2021>>>> IV vancomycin 03/11/2021>>>>>   Subjective: Thinks breathing is better  Objective: Vitals:   03/14/21 0810 03/14/21 0813 03/14/21 1035 03/14/21 1208  BP:    (!) 146/74  Pulse:  91  94  Resp:  20  18  Temp:    98.4 F (36.9 C)  TempSrc:    Oral  SpO2: 92% 100% 95% 96%  Weight:      Height:        Intake/Output Summary (Last 24 hours) at 03/14/2021 1420 Last data filed at 03/14/2021 1000 Gross per 24 hour  Intake 240 ml  Output 1400 ml  Net -1160 ml   Filed Weights   03/09/21 2011 03/10/21 1113 03/13/21 1347  Weight: 86.2 kg 82.8 kg 83.2 kg    Examination:   General: Appearance:     Overweight male in no acute distress     Lungs:     Coarse breath sounds in lower lobes, respirations unlabored  Heart:    Normal heart rate.    MS:   All extremities are intact. Min LE edema   Neurologic:   Awake, alert, oriented x 3. No apparent focal neurological           defect.          Data Reviewed:  I have personally reviewed following labs and imaging studies  CBC: Recent Labs  Lab 03/09/21 2054 03/10/21 1415 03/11/21 0537 03/12/21 0452 03/13/21 0413 03/14/21 0432  WBC 7.7 6.8 6.5 5.2 6.6 6.2  NEUTROABS 6.3  --   --  4.1 5.4  --   HGB 12.2* 12.3* 11.8* 10.2* 10.3* 9.6*  HCT 35.6* 36.8* 35.9* 30.9* 30.6* 28.6*  MCV 91.0 93.2 93.0 94.8 91.9 93.2  PLT 136* 142* 126* 126* 126* 136*    Basic Metabolic Panel: Recent Labs  Lab 03/10/21 1415 03/11/21 0537 03/12/21 0452 03/13/21 0413 03/14/21 0432  NA 137 135 135 136 136  K 4.5 4.3 4.0 4.0 3.6  CL 103 104 106 106 106  CO2 '26 26 24 24 22  '$ GLUCOSE 150* 102* 107* 114* 115*  BUN 29* 28* 29* 26* 31*  CREATININE 1.37* 1.28* 1.27* 1.09 1.33*  CALCIUM 8.7* 8.4* 7.8* 8.3* 8.2*  MG  --  2.2 2.2  --   --   PHOS  --   --   --  2.9  --     GFR: Estimated Creatinine Clearance: 50.3 mL/min (A) (by C-G formula based on SCr of 1.33 mg/dL (H)).  Liver Function Tests: Recent Labs  Lab 03/09/21 2054 03/10/21 1415 03/11/21 0537 03/13/21 0413  AST '16 17 18  '$ --   ALT '11 13 13  '$ --   ALKPHOS 81 88 87  --   BILITOT 0.9 0.7 0.9  --   PROT 6.5 6.7 6.3*  --   ALBUMIN 3.7 3.7 3.4* 3.0*    CBG: Recent Labs  Lab 03/11/21 2019  GLUCAP 180*     Recent Results (from the past 240 hour(s))  Culture, blood (routine x 2)     Status: None (Preliminary result)   Collection Time: 03/09/21  9:25 PM   Specimen: BLOOD  Result Value Ref Range Status   Specimen Description   Final    BLOOD RIGHT ANTECUBITAL Performed at [facility], [address] [place], [place] [address]    Special Requests   Final    BOTTLES DRAWN AEROBIC AND ANAEROBIC Blood Culture adequate volume Performed at [facility], [address], [place], [place] [address]    Culture   Final    NO GROWTH 4 DAYS Performed at [facility], [place] [address], [place], [place] [address]    Report Status PENDING  Incomplete  Culture, blood  (routine x 2)     Status: None (Preliminary result)   Collection Time: 03/09/21  9:30 PM   Specimen: BLOOD  Result Value Ref Range Status   Specimen Description   Final    BLOOD BLOOD RIGHT FOREARM Performed at [facility], [address] [place], [place] [address]    Special Requests   Final    BOTTLES DRAWN AEROBIC ONLY Blood Culture results may not  be optimal due to an inadequate volume of blood received in culture bottles Performed at [facility], [address] [place], [place] [address]    Culture   Final    NO GROWTH 4 DAYS Performed at [facility], [place] [address] [place], [place] [address]    Report Status PENDING  Incomplete  Resp Panel by RT-PCR (Flu A&B, Covid) Nasopharyngeal Swab     Status: None   Collection Time: 03/09/21 11:25 PM   Specimen: Nasopharyngeal Swab; Nasopharyngeal(NP) swabs in vial transport medium  Result Value Ref Range Status   SARS Coronavirus 2 by RT PCR NEGATIVE NEGATIVE Final    Comment: (NOTE) SARS-CoV-2 target nucleic acids are NOT DETECTED.  The SARS-CoV-2 RNA is generally detectable in upper respiratory specimens during the acute phase of infection. The lowest concentration of SARS-CoV-2 viral copies this assay can detect is 138 copies/mL. A negative result does not preclude SARS-Cov-2 infection and should not be used as the sole basis for treatment or other patient management decisions. A negative result may occur with  improper specimen collection/handling, submission of specimen other than nasopharyngeal swab, presence of viral mutation(s) within the areas targeted by this assay, and inadequate number of viral copies(<138 copies/mL). A negative result must be combined with clinical observations, patient history, and epidemiological information. The expected result is Negative.  Fact Sheet for Patients:  EntrepreneurPulse.com.au  Fact Sheet for Healthcare Providers:   IncredibleEmployment.be  This test is no t yet approved or cleared by the [place] FDA and  has been authorized for detection and/or diagnosis of SARS-CoV-2 by FDA under an Emergency Use Authorization (EUA). This EUA will remain  in effect (meaning this test can be used) for the duration of the COVID-19 declaration under Section 564(b)(1) of the Act, 21 U.S.C.section 360bbb-3(b)(1), unless the authorization is terminated  or revoked sooner.       Influenza A by PCR NEGATIVE NEGATIVE Final   Influenza B by PCR NEGATIVE NEGATIVE Final    Comment: (NOTE) The Xpert Xpress SARS-CoV-2/FLU/RSV plus assay is intended as an aid in the diagnosis of influenza from Nasopharyngeal swab specimens and should not be used as a sole basis for treatment. Nasal washings and aspirates are unacceptable for Xpert Xpress SARS-CoV-2/FLU/RSV testing.  Fact Sheet for Patients: EntrepreneurPulse.com.au  Fact Sheet for Healthcare Providers: IncredibleEmployment.be  This test is not yet approved or cleared by the [place] FDA and has been authorized for detection and/or diagnosis of SARS-CoV-2 by FDA under an Emergency Use Authorization (EUA). This EUA will remain in effect (meaning this test can be used) for the duration of the COVID-19 declaration under Section 564(b)(1) of the Act, 21 U.S.C. section 360bbb-3(b)(1), unless the authorization is terminated or revoked.  Performed at [facility], [address] [place], [place] [address]   Urine Culture     Status: Abnormal   Collection Time: 03/10/21  8:00 AM   Specimen: Urine, Clean Catch  Result Value Ref Range Status   Specimen Description   Final    URINE, CLEAN CATCH Performed at [facility], [address] [place], [place] [address]    Special Requests   Final    NONE Performed at [facility], [place] [place], [place] [address]     Culture 10,000 COLONIES/mL ENTEROCOCCUS FAECALIS (A)  Final   Report Status 03/13/2021 FINAL  Final   Organism ID, Bacteria ENTEROCOCCUS FAECALIS (A)  Final  Susceptibility   Enterococcus faecalis - MIC*    AMPICILLIN <=2 SENSITIVE Sensitive     NITROFURANTOIN <=16 SENSITIVE Sensitive     VANCOMYCIN 1 SENSITIVE Sensitive     * 10,000 COLONIES/mL ENTEROCOCCUS FAECALIS  Urine Culture     Status: None   Collection Time: 03/10/21  1:55 PM   Specimen: Urine, Clean Catch  Result Value Ref Range Status   Specimen Description   Final    URINE, CLEAN CATCH Performed at [facility], [place] [address] [place], [place] [address]    Special Requests   Final    NONE Performed at [facility], [place] [address], [place], [place] [address]    Culture   Final    NO GROWTH Performed at [facility], [place] [address] [place], [place] [address]    Report Status 03/12/2021 FINAL  Final  Culture, blood (Routine X 2) w Reflex to ID Panel     Status: None (Preliminary result)   Collection Time: 03/12/21 11:15 AM   Specimen: BLOOD  Result Value Ref Range Status   Specimen Description   Final    BLOOD BLOOD RIGHT HAND Performed at [facility] [address], [place], [place] [address]    Special Requests   Final    BOTTLES DRAWN AEROBIC ONLY Blood Culture results may not be optimal due to an inadequate volume of blood received in culture bottles Performed at [facility] [address], [place], [place] [address]    Culture   Final    NO GROWTH 2 DAYS Performed at [place] [address], [place], [place] [address]    Report Status PENDING  Incomplete  Culture, blood (Routine X 2) w Reflex to ID Panel     Status: None (Preliminary result)   Collection Time: 03/12/21 11:15 AM   Specimen: BLOOD  Result Value Ref Range Status   Specimen Description   Final    BLOOD RIGHT ANTECUBITAL Performed at [facility] [address], [place], [place] [address]    Special Requests   Final    BOTTLES DRAWN AEROBIC ONLY Blood Culture results may not be optimal due to an inadequate volume of blood received in culture bottles Performed at [facility] [address], [place], [place] [address]    Culture   Final    NO GROWTH 2 DAYS Performed at [facility] [address], [place], [place] [address]    Report Status PENDING  Incomplete  Urine Culture     Status: None   Collection Time: 03/12/21  1:08 PM   Specimen: Urine, Catheterized  Result Value Ref Range Status   Specimen Description   Final    URINE, CATHETERIZED Performed at [facility], [place] [address], [place], [place] [address]    Special Requests   Final    NONE Performed at [facility], [place] [address] [place], [place] [address]    Culture   Final    NO GROWTH Performed at [facility], [place] [address] [place],  [address]    Report Status 03/13/2021 FINAL  Final  MRSA Next Gen by PCR, Nasal     Status: None   Collection Time: 03/13/21  9:08 AM   Specimen: Nasal Mucosa; Nasal Swab  Result Value Ref Range Status   MRSA by PCR Next Gen NOT DETECTED NOT DETECTED Final  Comment: (NOTE) The GeneXpert MRSA Assay (FDA approved for NASAL specimens only), is one component of a comprehensive MRSA colonization surveillance program. It is not intended to diagnose MRSA infection nor to guide or monitor treatment for MRSA infections. Test performance is not FDA approved in patients less than 32 years old. Performed at [facility], [place] [address] [place], [place] [address]          Radiology Studies: DG CHEST PORT 1 VIEW  Result Date: 03/12/2021 CLINICAL DATA:  75 year old male with fatigue and shortness of breath. EXAM: PORTABLE CHEST - 1 VIEW COMPARISON:  03/11/2021 FINDINGS: The mediastinal contours are within  normal limits. No cardiomegaly. Worsening bibasilar streaky opacities and blunting of the left costophrenic angle. Similar appearing elevation of the left hemidiaphragm. No acute osseous abnormality. IMPRESSION: Worsening bibasilar subsegmental atelectasis and trace left pleural effusion. Electronically Signed   By: Ruthann Cancer MD   On: 03/12/2021 15:47        Scheduled Meds:  enoxaparin (LOVENOX) injection  40 mg Subcutaneous Q24H   fluticasone  2 spray Each Nare Daily   guaiFENesin  1,200 mg Oral BID   ipratropium-albuterol  3 mL Nebulization TID   lamoTRIgine  300 mg Oral BID   loratadine  10 mg Oral Daily   minoxidil  40 mg Oral Daily   pantoprazole  40 mg Oral Q0600   polycarbophil  625 mg Oral Daily   prazosin  1 mg Oral QHS   prednisoLONE acetate  1 drop Left Eye QHS   primidone  50 mg Oral BID   simvastatin  20 mg Oral Daily   Continuous Infusions:  cefTRIAXone (ROCEPHIN)  IV 2 g (03/14/21 1021)   vancomycin Stopped (03/14/21 0451)     LOS: 4 days    Time spent: 35 minutes    Geradine Girt, DO [facility]   To contact the attending provider between 7A-7P or the covering provider during after hours 7P-7A, please log into the web site [email or web address] and access using universal [place] password for that web site. If you do not have the password, please call the hospital operator.  03/14/2021, 2:20 PM

## 2021-03-14 NOTE — TOC Initial Note (Signed)
Transition of Care [facility]) - Initial/Assessment Note    Patient Details  Name: [name] MRN: [id number] Date of Birth: 28-Apr-1946  [facility] CM/SW Contact:    Dessa Phi, RN Phone Number: 03/14/2021, 10:51 AM  Clinical Narrative: TC spouse [name] to discuss d/c plans-home, has pcp, pharmacy,transport. Will monitor on 02 if needed for d/c.                 Expected Discharge Plan: Home/Self Care Barriers to Discharge: Continued Medical Work up   Patient Goals and CMS Choice Patient states their goals for this hospitalization and ongoing recovery are:: go home CMS Medicare.gov Compare Post Acute Care list provided to:: Patient Represenative (must comment)    Expected Discharge Plan and Services Expected Discharge Plan: Home/Self Care   Discharge Planning Services: CM Consult                                          Prior Living Arrangements/Services   Lives with:: Spouse Patient language and need for interpreter reviewed:: Yes Do you feel safe going back to the place where you live?: Yes      Need for Family Participation in Patient Care: No (Comment) Care giver support system in place?: Yes (comment)   Criminal Activity/Legal Involvement Pertinent to Current Situation/Hospitalization: No - Comment as needed  Activities of Daily Living Home Assistive Devices/Equipment: Eyeglasses ADL Screening (condition at time of admission) Patient's cognitive ability adequate to safely complete daily activities?: Yes Is the patient deaf or have difficulty hearing?: No Does the patient have difficulty seeing, even when wearing glasses/contacts?: No Does the patient have difficulty concentrating, remembering, or making decisions?: No Patient able to express need for assistance with ADLs?: Yes Does the patient have difficulty dressing or bathing?: No Independently performs ADLs?: Yes (appropriate for developmental age) Does the patient have difficulty  walking or climbing stairs?: Yes (secondary to weakness and shortness of breath) Weakness of Legs: Both Weakness of Arms/Hands: None  Permission Sought/Granted Permission sought to share information with : Case Manager Permission granted to share information with : Yes, Verbal Permission Granted  Share Information with NAME: Case Manager     Permission granted to share info w Relationship: [name] spouse [phone]     Emotional Assessment Appearance:: Appears stated age Attitude/Demeanor/Rapport: Gracious Affect (typically observed): Accepting Orientation: : Oriented to Self, Oriented to Place, Oriented to  Time, Oriented to Situation Alcohol / Substance Use: Not Applicable Psych Involvement: No (comment)  Admission diagnosis:  Acute respiratory failure with hypoxia ([place]) [J96.01] Patient Active Problem List   Diagnosis Date Noted   Hypotension [id number]   Acute metabolic encephalopathy 0000000   CAP (community acquired pneumonia) 03/11/2021   Acute renal failure superimposed on stage 3a chronic kidney disease ([place]) 03/11/2021   Acute respiratory failure with hypoxia ([place]) [id number]   Diastolic 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 heart failure 12/18/2019   Atypical chest pain 11/19/2019   Near syncope 01/26/2015   Dizziness and giddiness 01/06/2015   Transient cerebral ischemia 01/06/2015   Hyperreflexia 12/14/2014   Leg weakness 12/14/2014   Bronchiectasis without acute exacerbation ([place]) 10/09/2014   Rib pain on right side 10/09/2014   Blepharitis 09/27/2014   Branch retinal vein occlusion 09/27/2014   Combined form of senile cataract 09/27/2014   NS (nuclear sclerosis) 09/27/2014   Polyneuropathy 09/10/2014   Dysesthesia 09/10/2014   Essential tremor 09/10/2014  Low back pain with sciatica 06/15/2014   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   Hypercholesterolemia 06/15/2014   Degeneration of intervertebral disc of lumbar region 06/15/2014    Lumbar and sacral osteoarthritis 06/15/2014   Eyeball rupture 11/05/2011   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   Age related cataract 10/22/2011   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   OSA (obstructive sleep apnea)    History of prostate cancer    Elevated diaphragm    Normocytic anemia    Essential hypertension    Gout    PCP:  Deland Pretty, MD Pharmacy:   [facility], [place] [address] Phone: [phone] Fax: [phone]  [facility] [id number] - [place], [place] [address] [address] Phone: [phone] Fax: [facility] [id number] - [place], [place] - [address] [place] [address] Phone: [phone] Fax: [phone]     Social Determinants of Health (SDOH) Interventions    Readmission Risk Interventions No flowsheet data found.

## 2021-03-14 NOTE — Plan of Care (Signed)

## 2021-03-15 LAB — CBC
HCT: 32 % — ABNORMAL LOW (ref 39.0–52.0)
Hemoglobin: 11 g/dL — ABNORMAL LOW (ref 13.0–17.0)
MCH: 31.3 pg (ref 26.0–34.0)
MCHC: 34.4 g/dL (ref 30.0–36.0)
MCV: 91.2 fL (ref 80.0–100.0)
Platelets: 186 10*3/uL (ref 150–400)
RBC: 3.51 MIL/uL — ABNORMAL LOW (ref 4.22–5.81)
RDW: 14.4 % (ref 11.5–15.5)
WBC: 10.5 10*3/uL (ref 4.0–10.5)
nRBC: 0 % (ref 0.0–0.2)

## 2021-03-15 LAB — BASIC METABOLIC PANEL
Anion gap: 8 (ref 5–15)
BUN: 19 mg/dL (ref 8–23)
CO2: 24 mmol/L (ref 22–32)
Calcium: 8.5 mg/dL — ABNORMAL LOW (ref 8.9–10.3)
Chloride: 103 mmol/L (ref 98–111)
Creatinine, Ser: 0.92 mg/dL (ref 0.61–1.24)
GFR, Estimated: >60 mL/min (ref >60)
Glucose, Bld: 123 mg/dL — ABNORMAL HIGH (ref 70–99)
Potassium: 3.1 mmol/L — ABNORMAL LOW (ref 3.5–5.1)
Sodium: 135 mmol/L (ref 135–145)

## 2021-03-15 LAB — CULTURE, BLOOD (ROUTINE X 2)
Culture: NO GROWTH
Culture: NO GROWTH
Special Requests: ADEQUATE

## 2021-03-15 MED ORDER — CEFTRIAXONE SODIUM 2 G IJ SOLR
2.0000 g | INTRAMUSCULAR | Status: AC
  Administered 2021-03-15 – 2021-03-16 (×2): 2 g via INTRAVENOUS
  Filled 2021-03-15: qty 2
  Filled 2021-03-15: qty 20

## 2021-03-15 MED ORDER — ZOLPIDEM TARTRATE 5 MG PO TABS
5.0000 mg | ORAL_TABLET | Freq: Every evening | ORAL | Status: DC | PRN
  Administered 2021-03-15 – 2021-03-16 (×2): 5 mg via ORAL
  Filled 2021-03-15 (×2): qty 1

## 2021-03-15 MED ORDER — POTASSIUM CHLORIDE CRYS ER 20 MEQ PO TBCR
40.0000 meq | EXTENDED_RELEASE_TABLET | Freq: Two times a day (BID) | ORAL | Status: AC
  Administered 2021-03-15 (×2): 40 meq via ORAL
  Filled 2021-03-15 (×2): qty 2

## 2021-03-15 MED ORDER — FUROSEMIDE 10 MG/ML IJ SOLN
20.0000 mg | Freq: Once | INTRAMUSCULAR | Status: AC
  Administered 2021-03-15: 20 mg via INTRAVENOUS
  Filled 2021-03-15: qty 2

## 2021-03-15 NOTE — Progress Notes (Addendum)
PROGRESS NOTE    [name]  [id number] DOB: 05/24/1946 DOA: 03/09/2021 PCP: Deland Pretty, MD    Chief Complaint  Patient presents with   Dizziness   Weakness    Brief Narrative:  Patient is a very pleasant 75 year old gentleman history of hypertension, hyperlipidemia, neuropathy, OSA presented with dyspnea, confusion.  It was noted that 3 days prior to admission patient's family noticed patient was fatigue, decreased appetite, no nausea or vomiting, development of generalized weakness with ambulation and noted to be hypoxic.  Patient noted to have sats down to 89 to 90%, seemed disoriented and subsequently EMS called.  Patient brought to the ED found to be hypoxic, chest x-ray with bibasilar atelectasis versus infiltrate, noted to have a fever, COVID-19 PCR/flu negative.  Patient placed on oxygen, started on IV Rocephin, hospitalist called to admit.   Assessment & Plan:   Principal Problem:   Acute respiratory failure with hypoxia (HCC) Active Problems:   OSA (obstructive sleep apnea)   Essential hypertension   Hypercholesterolemia   Acute metabolic encephalopathy   CAP (community acquired pneumonia)   Acute renal failure superimposed on stage 3a chronic kidney disease (HCC)   Hypotension   acute hypoxic respiratory failure secondary to probable CAP -Patient presented with confusion, generalized weakness, hypoxia, initially required 6 L high flow nasal cannula-- has been up to 15L HF -D-dimer noted to be elevated at 2.54. -CT angiogram chest negative for PE with small left pleural effusion and bibasilar atelectasis/scarring. -WBC within normal limits. -Blood cultures NGTD -Urine Legionella antigen negative. -Urine pneumococcus antigen negative. -Urine cultures with 10,000 colonies Enterococcus faecalis. -vancomycin and IV Rocephin.- narrow to 7 days of rocephin -aggressive pulm toilet- encouraged to do more -may need formal pulm consult if not improved- -pulm  looked over charts and think time is needed and no recommendations for medication changes needed -BNP elevated, x ray shows worsening atelectasis/infiltrates-- last echo showed EF preserved but grade 2 diastolic dfxn - IV lasix x 1- good UOP- repeat today  Hypotension -resolved, monitor  Acute metabolic encephalopathy -avoid morphine  Hyperlipidemia -Statin.  Hypertension -monitor with diuresis  chronic kidney disease stage IIIa -daily labs  Gout -Allopurinol on hold due to AKI.   OSA CPAP nightly.  Urinary retention -resolved  Hypokalemia -replete  DVT prophylaxis: Lovenox Code Status: Full Family Communication: Updated patient, wife, daughter at bedside. Disposition:   Status is: Inpatient  Remains inpatient appropriate because:Inpatient level of care appropriate due to severity of illness  Dispo: The patient is from: Home              Anticipated d/c is to: Home              Patient currently is not medically stable to d/c.   Difficult to place patient No       Consultants:  None  Procedures:  CT angiogram chest 03/11/2021 Chest x-ray 03/09/2021 CT head 03/09/2021 Renal ultrasound 03/10/2021  Antimicrobials:  IV Rocephin 03/10/2021>>>> IV vancomycin 03/11/2021>>>>>   Subjective: Did not sleep well last night  Objective: Vitals:   03/15/21 0500 03/15/21 0746 03/15/21 1212 03/15/21 1350  BP:   (!) 159/73   Pulse:   90   Resp:   (!) 25   Temp:   98.3 F (36.8 C)   TempSrc:   Oral   SpO2:  93% 94% 91%  Weight: 83.1 kg     Height:        Intake/Output Summary (Last 24 hours) at 03/15/2021 1354  Last data filed at 03/15/2021 0956 Gross per 24 hour  Intake 480 ml  Output --  Net 480 ml   Filed Weights   03/10/21 1113 03/13/21 1347 03/15/21 0500  Weight: 82.8 kg 83.2 kg 83.1 kg    Examination:   General: Appearance:     Overweight male in no acute distress     Lungs:     Coarse breath sounds lower lobes, respirations unlabored  Heart:     Normal heart rate.    MS:   All extremities are intact.  Min LE edema in lower legs   Neurologic:   Awake, alert, oriented x 3. No apparent focal neurological           defect.            Data Reviewed: I have personally reviewed following labs and imaging studies  CBC: Recent Labs  Lab 03/09/21 2054 03/10/21 1415 03/11/21 0537 03/12/21 0452 03/13/21 0413 03/14/21 0432 03/15/21 1137  WBC 7.7   < > 6.5 5.2 6.6 6.2 10.5  NEUTROABS 6.3  --   --  4.1 5.4  --   --   HGB 12.2*   < > 11.8* 10.2* 10.3* 9.6* 11.0*  HCT 35.6*   < > 35.9* 30.9* 30.6* 28.6* 32.0*  MCV 91.0   < > 93.0 94.8 91.9 93.2 91.2  PLT 136*   < > 126* 126* 126* 136* 186   < > = values in this interval not displayed.    Basic Metabolic Panel: Recent Labs  Lab 03/11/21 0537 03/12/21 0452 03/13/21 0413 03/14/21 0432 03/15/21 1137  NA 135 135 136 136 135  K 4.3 4.0 4.0 3.6 3.1*  CL 104 106 106 106 103  CO2 '26 24 24 22 24  '$ GLUCOSE 102* 107* 114* 115* 123*  BUN 28* 29* 26* 31* 19  CREATININE 1.28* 1.27* 1.09 1.33* 0.92  CALCIUM 8.4* 7.8* 8.3* 8.2* 8.5*  MG 2.2 2.2  --   --   --   PHOS  --   --  2.9  --   --     GFR: Estimated Creatinine Clearance: 72.8 mL/min (by C-G formula based on SCr of 0.92 mg/dL).  Liver Function Tests: Recent Labs  Lab 03/09/21 2054 03/10/21 1415 03/11/21 0537 03/13/21 0413  AST '16 17 18  '$ --   ALT '11 13 13  '$ --   ALKPHOS 81 88 87  --   BILITOT 0.9 0.7 0.9  --   PROT 6.5 6.7 6.3*  --   ALBUMIN 3.7 3.7 3.4* 3.0*    CBG: Recent Labs  Lab 03/11/21 2019  GLUCAP 180*     Recent Results (from the past 240 hour(s))  Culture, blood (routine x 2)     Status: None   Collection Time: 03/09/21  9:25 PM   Specimen: BLOOD  Result Value Ref Range Status   Specimen Description   Final    BLOOD RIGHT ANTECUBITAL Performed at [facility], [address] [place], [place] [address]    Special Requests   Final    BOTTLES DRAWN AEROBIC AND ANAEROBIC Blood  Culture adequate volume Performed at [facility] [address], [place], [place] [address]    Culture   Final    NO GROWTH 5 DAYS Performed at [facility], [place] [address], [place], [place] [address]    Report Status 03/15/2021 FINAL  Final  Culture, blood (routine x 2)  Status: None   Collection Time: 03/09/21  9:30 PM   Specimen: BLOOD  Result Value Ref Range Status   Specimen Description   Final    BLOOD BLOOD RIGHT FOREARM Performed at [facility], [address] [place], [place] [address]    Special Requests   Final    BOTTLES DRAWN AEROBIC ONLY Blood Culture results may not be optimal due to an inadequate volume of blood received in culture bottles Performed at [facility], [address] [place], [place] [address]    Culture   Final    NO GROWTH 5 DAYS Performed at [facility], [place] [address] [place], [place] [address]    Report Status 03/15/2021 FINAL  Final  Resp Panel by RT-PCR (Flu A&B, Covid) Nasopharyngeal Swab     Status: None   Collection Time: 03/09/21 11:25 PM   Specimen: Nasopharyngeal Swab; Nasopharyngeal(NP) swabs in vial transport medium  Result Value Ref Range Status   SARS Coronavirus 2 by RT PCR NEGATIVE NEGATIVE Final    Comment: (NOTE) SARS-CoV-2 target nucleic acids are NOT DETECTED.  The SARS-CoV-2 RNA is generally detectable in upper respiratory specimens during the acute phase of infection. The lowest concentration of SARS-CoV-2 viral copies this assay can detect is 138 copies/mL. A negative result does not preclude SARS-Cov-2 infection and should not be used as the sole basis for treatment or other patient management decisions. A negative result may occur with  improper specimen collection/handling, submission of specimen other than nasopharyngeal swab, presence of viral mutation(s) within the areas targeted by this assay, and inadequate number of viral copies(<138 copies/mL). A negative  result must be combined with clinical observations, patient history, and epidemiological information. The expected result is Negative.  Fact Sheet for Patients:  EntrepreneurPulse.com.au  Fact Sheet for Healthcare Providers:  IncredibleEmployment.be  This test is no t yet approved or cleared by the [place] FDA and  has been authorized for detection and/or diagnosis of SARS-CoV-2 by FDA under an Emergency Use Authorization (EUA). This EUA will remain  in effect (meaning this test can be used) for the duration of the COVID-19 declaration under Section 564(b)(1) of the Act, 21 U.S.C.section 360bbb-3(b)(1), unless the authorization is terminated  or revoked sooner.       Influenza A by PCR NEGATIVE NEGATIVE Final   Influenza B by PCR NEGATIVE NEGATIVE Final    Comment: (NOTE) The Xpert Xpress SARS-CoV-2/FLU/RSV plus assay is intended as an aid in the diagnosis of influenza from Nasopharyngeal swab specimens and should not be used as a sole basis for treatment. Nasal washings and aspirates are unacceptable for Xpert Xpress SARS-CoV-2/FLU/RSV testing.  Fact Sheet for Patients: EntrepreneurPulse.com.au  Fact Sheet for Healthcare Providers: IncredibleEmployment.be  This test is not yet approved or cleared by the [place] FDA and has been authorized for detection and/or diagnosis of SARS-CoV-2 by FDA under an Emergency Use Authorization (EUA). This EUA will remain in effect (meaning this test can be used) for the duration of the COVID-19 declaration under Section 564(b)(1) of the Act, 21 U.S.C. section 360bbb-3(b)(1), unless the authorization is terminated or revoked.  Performed at [facility], [address] [place], [place] [address]   Urine Culture     Status: Abnormal   Collection Time: 03/10/21  8:00 AM   Specimen: Urine, Clean Catch  Result Value Ref Range Status   Specimen  Description   Final    URINE, CLEAN CATCH  Performed at [facility], [address] [place], [place] [address]    Special Requests   Final    NONE Performed at [facility], [address] [place], [place] [address]    Culture 10,000 COLONIES/mL ENTEROCOCCUS FAECALIS (A)  Final   Report Status 03/13/2021 FINAL  Final   Organism ID, Bacteria ENTEROCOCCUS FAECALIS (A)  Final      Susceptibility   Enterococcus faecalis - MIC*    AMPICILLIN <=2 SENSITIVE Sensitive     NITROFURANTOIN <=16 SENSITIVE Sensitive     VANCOMYCIN 1 SENSITIVE Sensitive     * 10,000 COLONIES/mL ENTEROCOCCUS FAECALIS  Urine Culture     Status: None   Collection Time: 03/10/21  1:55 PM   Specimen: Urine, Clean Catch  Result Value Ref Range Status   Specimen Description   Final    URINE, CLEAN CATCH Performed at [facility], [place] [address] [place], [place] [address]    Special Requests   Final    NONE Performed at [facility], [place] [address], [place], [place] [address]    Culture   Final    NO GROWTH Performed at [facility], [place] [address], [place], [place] [address]    Report Status 03/12/2021 FINAL  Final  Culture, blood (Routine X 2) w Reflex to ID Panel     Status: None (Preliminary result)   Collection Time: 03/12/21 11:15 AM   Specimen: BLOOD  Result Value Ref Range Status   Specimen Description   Final    BLOOD BLOOD RIGHT HAND Performed at [place] [address] [place], [place] [address]    Special Requests   Final    BOTTLES DRAWN AEROBIC ONLY Blood Culture results may not be optimal due to an inadequate volume of blood received in culture bottles Performed at [place] [address] [place], [place] [address]    Culture   Final    NO GROWTH 3 DAYS Performed at [facility], [place] [address], [place], [place] [address]    Report Status PENDING  Incomplete   Culture, blood (Routine X 2) w Reflex to ID Panel     Status: None (Preliminary result)   Collection Time: 03/12/21 11:15 AM   Specimen: BLOOD  Result Value Ref Range Status   Specimen Description   Final    BLOOD RIGHT ANTECUBITAL Performed at [place] [address], [place], [place] [address]    Special Requests   Final    BOTTLES DRAWN AEROBIC ONLY Blood Culture results may not be optimal due to an inadequate volume of blood received in culture bottles Performed at [place] [address] [place], [place] [address]    Culture   Final    NO GROWTH 3 DAYS Performed at [facility], [place] [address], [place], [place] [address]    Report Status PENDING  Incomplete  Urine Culture     Status: None   Collection Time: 03/12/21  1:08 PM   Specimen: Urine, Catheterized  Result Value Ref Range Status   Specimen Description   Final    URINE, CATHETERIZED Performed at [facility], [place] [address], [place], [place] [address]    Special Requests   Final    NONE Performed at [facility], [place] [address] [place], [place] [address]    Culture   Final  NO GROWTH Performed at [facility], [place] [address], [place], [place] [address]    Report Status 03/13/2021 FINAL  Final  MRSA Next Gen by PCR, Nasal     Status: None   Collection Time: 03/13/21  9:08 AM   Specimen: Nasal Mucosa; Nasal Swab  Result Value Ref Range Status   MRSA by PCR Next Gen NOT DETECTED NOT DETECTED Final    Comment: (NOTE) The GeneXpert MRSA Assay (FDA approved for NASAL specimens only), is one component of a comprehensive MRSA colonization surveillance program. It is not intended to diagnose MRSA infection nor to guide or monitor treatment for MRSA infections. Test performance is not FDA approved in patients less than 59 years old. Performed at [facility], [place] [address] [place], [place]  [address]          Radiology Studies: DG Chest 2 View  Result Date: 03/14/2021 CLINICAL DATA:  Cough EXAM: CHEST - 2 VIEW COMPARISON:  03/12/2021 FINDINGS: Low lung volumes. Persistent bibasilar opacities, left greater than right. Trace pleural effusions. Similar cardiomediastinal contours. IMPRESSION: Persistent bibasilar atelectasis/consolidation. Trace bilateral pleural effusions. Electronically Signed   By: Macy Mis M.D.   On: 03/14/2021 16:13        Scheduled Meds:  enoxaparin (LOVENOX) injection  40 mg Subcutaneous Q24H   fluticasone  2 spray Each Nare Daily   furosemide  20 mg Intravenous Once   guaiFENesin  1,200 mg Oral BID   ipratropium-albuterol  3 mL Nebulization TID   lamoTRIgine  300 mg Oral BID   loratadine  10 mg Oral Daily   minoxidil  40 mg Oral Daily   pantoprazole  40 mg Oral Q0600   polycarbophil  625 mg Oral Daily   potassium chloride  40 mEq Oral BID   prazosin  1 mg Oral QHS   prednisoLONE acetate  1 drop Left Eye QHS   primidone  50 mg Oral BID   simvastatin  20 mg Oral Daily   Continuous Infusions:  cefTRIAXone (ROCEPHIN)  IV 2 g (03/15/21 1147)     LOS: 5 days    Time spent: 35 minutes    Geradine Girt, DO [facility]   To contact the attending provider between 7A-7P or the covering provider during after hours 7P-7A, please log into the web site [email or web address] and access using universal [place] password for that web site. If you do not have the password, please call the hospital operator.  03/15/2021, 1:54 PM

## 2021-03-15 NOTE — Plan of Care (Signed)

## 2021-03-15 NOTE — Progress Notes (Signed)
   03/15/21 2050  Vitals  Temp 99.4 F (37.4 C)  Temp Source Oral  BP (!) 139/59  BP Location Right Arm  BP Method Automatic  Patient Position (if appropriate) Lying  Pulse Rate 100  Pulse Rate Source Dinamap  ECG Heart Rate (!) 103  Resp (!) 32  Level of Consciousness  Level of Consciousness Alert  Oxygen Therapy  SpO2 95 %  O2 Device HFNC  O2 Flow Rate (L/min) 6 L/min  Provider Notification  Provider Name/Title J.Olena Heckle  Date Provider Notified 03/15/21  Time Provider Notified 2055  Notification Type Page  Notification Reason Change in status  Provider response At bedside  Date of Provider Response 03/15/21  Time of Provider Response 2058    Pt changed status to yellow. N.P. Oncall informed. RT informed. Charge RN informed. RT recommended Bipap. N.P informed and ordered BIPAP as needed.

## 2021-03-15 NOTE — Progress Notes (Signed)
Pt placed on bipap for respiratory distress and increased wob.  CPT via vest not done at this time.

## 2021-03-16 LAB — BASIC METABOLIC PANEL
Anion gap: 7 (ref 5–15)
BUN: 15 mg/dL (ref 8–23)
CO2: 24 mmol/L (ref 22–32)
Calcium: 8.2 mg/dL — ABNORMAL LOW (ref 8.9–10.3)
Chloride: 104 mmol/L (ref 98–111)
Creatinine, Ser: 0.98 mg/dL (ref 0.61–1.24)
GFR, Estimated: >60 mL/min (ref >60)
Glucose, Bld: 122 mg/dL — ABNORMAL HIGH (ref 70–99)
Potassium: 3.5 mmol/L (ref 3.5–5.1)
Sodium: 135 mmol/L (ref 135–145)

## 2021-03-16 LAB — BRAIN NATRIURETIC PEPTIDE: B Natriuretic Peptide: 348.1 pg/mL — ABNORMAL HIGH (ref 0.0–100.0)

## 2021-03-16 MED ORDER — POTASSIUM CHLORIDE CRYS ER 20 MEQ PO TBCR
40.0000 meq | EXTENDED_RELEASE_TABLET | Freq: Once | ORAL | Status: AC
  Administered 2021-03-16: 40 meq via ORAL
  Filled 2021-03-16: qty 2

## 2021-03-16 MED ORDER — RISAQUAD PO CAPS
2.0000 | ORAL_CAPSULE | Freq: Every day | ORAL | Status: DC
  Administered 2021-03-16 – 2021-03-20 (×5): 2 via ORAL
  Filled 2021-03-16 (×5): qty 2

## 2021-03-16 MED ORDER — FUROSEMIDE 10 MG/ML IJ SOLN
20.0000 mg | Freq: Once | INTRAMUSCULAR | Status: AC
  Administered 2021-03-16: 20 mg via INTRAVENOUS
  Filled 2021-03-16: qty 2

## 2021-03-16 MED ORDER — SODIUM CHLORIDE 0.9 % IV SOLN
2.0000 g | Freq: Three times a day (TID) | INTRAVENOUS | Status: AC
  Administered 2021-03-16 – 2021-03-19 (×11): 2 g via INTRAVENOUS
  Filled 2021-03-16 (×12): qty 2

## 2021-03-16 NOTE — Progress Notes (Signed)
Mobility Specialist - Progress Note    03/16/21 1648  Oxygen Therapy  SpO2 94 %  O2 Device Nasal Cannula  O2 Flow Rate (L/min) 4 L/min  Mobility  Activity Sat and stood x 3;Ambulated in room  Level of Assistance Minimal assist, patient does 75% or more  Assistive Device Front wheel walker  Distance Ambulated (ft) 5 ft  Mobility Ambulated with assistance in room  Mobility Response Tolerated well  Mobility performed by Mobility specialist  $Mobility charge 1 Mobility    Pre-mobility: 97%  SpO2 During mobility: 89-93% SpO2 Post-mobility: 94%  SPO2  Upon entry pt was sitting in recliner and seemed eager to ambulate. Pt was connected to 4L of O2 before standing. Pt experienced LOB when standing edge of chair and O2 began to desat to 89% SPO2. Pt was still determined to move and was instructed to complete x5 sit to stands before moving to bed. Pt ambulated ~5 ft with RW to sit EOB. Pt required Min A to turn and sit in bed. Pt was assisted by mobility specialist and RN in order to lay in bed. Pt was left with call bell at side, bed alarm on, and family in room. RN informed of session of O2 desats.   [place] Specialist Acute Rehabilitation Services Phone: [phone] 03/16/21, 5:01 PM

## 2021-03-16 NOTE — Progress Notes (Signed)
Mobility Specialist - Progress Note    03/16/21 1200  Oxygen Therapy  SpO2 93 %  O2 Device Nasal Cannula  O2 Flow Rate (L/min) 4 L/min  Mobility  Activity Ambulated in hall  Level of Assistance Contact guard assist, steadying assist  Assistive Device Four wheel walker  Distance Ambulated (ft) 340 ft  Mobility Ambulated with assistance in hallway  Mobility Response Tolerated well  Mobility performed by Mobility specialist  $Mobility charge 1 Mobility    Pre-mobility: 101 HR, 94% SpO2 During mobility: 104 HR, 93%-95%SpO2 Post-mobility: 97 HR, 98% SPO2  Pt ambulated ~340 ft on 4L of O2 in hallway while pushing RW. Pt required contact guard assist when standing EOB and ambulating in hall. Pt did not c/o of dizziness or fatigue during session, but did experience SOB. O2 sats measured and posted above. Pt required 1 standing rest break of ~1 minute to practice pursed breathing. Pt returned to recliner after session and was left with family in room. Will check back with pt per RN request.   Ralene Muskrat Mobility Specialist Acute Rehabilitation Services Phone: [phone] 03/16/21, 12:50 PM

## 2021-03-16 NOTE — Progress Notes (Signed)
Placed patient on home CPAP at 12cm for the night. Oxygen set at 4lpm with Sp02=95%

## 2021-03-16 NOTE — Progress Notes (Addendum)
Pt tolerated BIPAP for 3 hours. Pt requested to have a break with BIPAP. Will continue to monitor.

## 2021-03-16 NOTE — Progress Notes (Signed)
PROGRESS NOTE    [name]  [id number] DOB: 04-Mar-1946 DOA: 03/09/2021 PCP: Deland Pretty, MD    Chief Complaint  Patient presents with   Dizziness   Weakness    Brief Narrative:  Patient is a very pleasant 75 year old gentleman history of hypertension, hyperlipidemia, neuropathy, OSA presented with dyspnea, confusion.  It was noted that 3 days prior to admission patient's family noticed patient was fatigue, decreased appetite, no nausea or vomiting, development of generalized weakness with ambulation and noted to be hypoxic.  Slow to improve   Assessment & Plan:   Principal Problem:   Acute respiratory failure with hypoxia (HCC) Active Problems:   OSA (obstructive sleep apnea)   Essential hypertension   Hypercholesterolemia   Acute metabolic encephalopathy   CAP (community acquired pneumonia)   Acute renal failure superimposed on stage 3a chronic kidney disease (HCC)   Hypotension    acute hypoxic respiratory failure secondary to probable CAP -Patient presented with confusion, generalized weakness, hypoxia, initially required 6 L high flow nasal cannula-- has been up to 15L HF -D-dimer noted to be elevated at 2.54. -CT angiogram chest negative for PE with small left pleural effusion and bibasilar atelectasis/scarring. -WBC within normal limits. -Blood cultures NGTD -Urine Legionella antigen negative. -Urine pneumococcus antigen negative. -Urine cultures with 10,000 colonies Enterococcus faecalis. -vancomycin and IV Rocephin--- expand to cefepime -aggressive pulm toilet- encouraged to do more-pulm looked over charts and think time is needed and no recommendations for medication changes needed -BNP elevated, x ray shows worsening atelectasis/infiltrates-- last echo showed EF preserved but grade 2 diastolic dfxn - IV lasix again today- -today's weight does not seem accurate  Hypotension -resolved, monitor  Acute metabolic encephalopathy -avoid  morphine  Hyperlipidemia -Statin.  Hypertension -monitor with diuresis  chronic kidney disease stage IIIa -daily labs  Gout -Allopurinol on hold due to AKI.   OSA CPAP nightly.  Urinary retention -resolved  Hypokalemia -replete  DVT prophylaxis: Lovenox Code Status: Full Family Communication: Updated patient, wife, daughter at bedside. Disposition:   Status is: Inpatient  Remains inpatient appropriate because:Inpatient level of care appropriate due to severity of illness  Dispo: The patient is from: Home              Anticipated d/c is to: Home              Patient currently is not medically stable to d/c.   Difficult to place patient No       Consultants:  None  Procedures:  CT angiogram chest 03/11/2021 Chest x-ray 03/09/2021 CT head 03/09/2021 Renal ultrasound 03/10/2021  Antimicrobials:  IV Rocephin 03/10/2021>>>> IV vancomycin 03/11/2021>>>>>   Subjective: Feels better today  Objective: Vitals:   03/16/21 0633 03/16/21 0749 03/16/21 1200 03/16/21 1328  BP: (!) 140/53   (!) 154/70  Pulse: 89   100  Resp: (!) 21   (!) 24  Temp: 98.7 F (37.1 C)   98.6 F (37 C)  TempSrc: Oral     SpO2: 96% 94% 93% 96%  Weight:      Height:        Intake/Output Summary (Last 24 hours) at 03/16/2021 1342 Last data filed at 03/15/2021 1931 Gross per 24 hour  Intake 100 ml  Output 1700 ml  Net -1600 ml   Filed Weights   03/13/21 1347 03/15/21 0500 03/16/21 0500  Weight: 83.2 kg 83.1 kg 91.4 kg    Examination:    General: Appearance:     Overweight male in no  acute distress     Lungs:     Improved aeration at bases. respirations unlabored  Heart:    Tachycardic.   MS:   All extremities are intact.    Neurologic:   Awake, alert, oriented x 3. No apparent focal neurological           defect.       Data Reviewed: I have personally reviewed following labs and imaging studies  CBC: Recent Labs  Lab 03/09/21 2054 03/10/21 1415 03/11/21 0537  03/12/21 0452 03/13/21 0413 03/14/21 0432 03/15/21 1137  WBC 7.7   < > 6.5 5.2 6.6 6.2 10.5  NEUTROABS 6.3  --   --  4.1 5.4  --   --   HGB 12.2*   < > 11.8* 10.2* 10.3* 9.6* 11.0*  HCT 35.6*   < > 35.9* 30.9* 30.6* 28.6* 32.0*  MCV 91.0   < > 93.0 94.8 91.9 93.2 91.2  PLT 136*   < > 126* 126* 126* 136* 186   < > = values in this interval not displayed.    Basic Metabolic Panel: Recent Labs  Lab 03/11/21 0537 03/12/21 0452 03/13/21 0413 03/14/21 0432 03/15/21 1137 03/16/21 0447  NA 135 135 136 136 135 135  K 4.3 4.0 4.0 3.6 3.1* 3.5  CL 104 106 106 106 103 104  CO2 '26 24 24 22 24 24  '$ GLUCOSE 102* 107* 114* 115* 123* 122*  BUN 28* 29* 26* 31* 19 15  CREATININE 1.28* 1.27* 1.09 1.33* 0.92 0.98  CALCIUM 8.4* 7.8* 8.3* 8.2* 8.5* 8.2*  MG 2.2 2.2  --   --   --   --   PHOS  --   --  2.9  --   --   --     GFR: Estimated Creatinine Clearance: 74.1 mL/min (by C-G formula based on SCr of 0.98 mg/dL).  Liver Function Tests: Recent Labs  Lab 03/09/21 2054 03/10/21 1415 03/11/21 0537 03/13/21 0413  AST '16 17 18  '$ --   ALT '11 13 13  '$ --   ALKPHOS 81 88 87  --   BILITOT 0.9 0.7 0.9  --   PROT 6.5 6.7 6.3*  --   ALBUMIN 3.7 3.7 3.4* 3.0*    CBG: Recent Labs  Lab 03/11/21 2019  GLUCAP 180*     Recent Results (from the past 240 hour(s))  Culture, blood (routine x 2)     Status: None   Collection Time: 03/09/21  9:25 PM   Specimen: BLOOD  Result Value Ref Range Status   Specimen Description   Final    BLOOD RIGHT ANTECUBITAL Performed at [facility], [address] [place], [place] [address]    Special Requests   Final    BOTTLES DRAWN AEROBIC AND ANAEROBIC Blood Culture adequate volume Performed at [facility], [address] [place], [place] [address]    Culture   Final    NO GROWTH 5 DAYS Performed at [facility], [place] [address], [place], [place] [address]    Report Status 03/15/2021 FINAL  Final  Culture, blood (routine x  2)     Status: None   Collection Time: 03/09/21  9:30 PM   Specimen: BLOOD  Result Value Ref Range Status   Specimen Description   Final    BLOOD BLOOD RIGHT FOREARM Performed at [facility], [address] [place], [place] [address]    Special Requests  Final    BOTTLES DRAWN AEROBIC ONLY Blood Culture results may not be optimal due to an inadequate volume of blood received in culture bottles Performed at [facility], [address] [place], [place] [address]    Culture   Final    NO GROWTH 5 DAYS Performed at [facility], [place] [address], [place], [place] [address]    Report Status 03/15/2021 FINAL  Final  Resp Panel by RT-PCR (Flu A&B, Covid) Nasopharyngeal Swab     Status: None   Collection Time: 03/09/21 11:25 PM   Specimen: Nasopharyngeal Swab; Nasopharyngeal(NP) swabs in vial transport medium  Result Value Ref Range Status   SARS Coronavirus 2 by RT PCR NEGATIVE NEGATIVE Final    Comment: (NOTE) SARS-CoV-2 target nucleic acids are NOT DETECTED.  The SARS-CoV-2 RNA is generally detectable in upper respiratory specimens during the acute phase of infection. The lowest concentration of SARS-CoV-2 viral copies this assay can detect is 138 copies/mL. A negative result does not preclude SARS-Cov-2 infection and should not be used as the sole basis for treatment or other patient management decisions. A negative result may occur with  improper specimen collection/handling, submission of specimen other than nasopharyngeal swab, presence of viral mutation(s) within the areas targeted by this assay, and inadequate number of viral copies(<138 copies/mL). A negative result must be combined with clinical observations, patient history, and epidemiological information. The expected result is Negative.  Fact Sheet for Patients:  EntrepreneurPulse.com.au  Fact Sheet for Healthcare Providers:   IncredibleEmployment.be  This test is no t yet approved or cleared by the [place] FDA and  has been authorized for detection and/or diagnosis of SARS-CoV-2 by FDA under an Emergency Use Authorization (EUA). This EUA will remain  in effect (meaning this test can be used) for the duration of the COVID-19 declaration under Section 564(b)(1) of the Act, 21 U.S.C.section 360bbb-3(b)(1), unless the authorization is terminated  or revoked sooner.       Influenza A by PCR NEGATIVE NEGATIVE Final   Influenza B by PCR NEGATIVE NEGATIVE Final    Comment: (NOTE) The Xpert Xpress SARS-CoV-2/FLU/RSV plus assay is intended as an aid in the diagnosis of influenza from Nasopharyngeal swab specimens and should not be used as a sole basis for treatment. Nasal washings and aspirates are unacceptable for Xpert Xpress SARS-CoV-2/FLU/RSV testing.  Fact Sheet for Patients: EntrepreneurPulse.com.au  Fact Sheet for Healthcare Providers: IncredibleEmployment.be  This test is not yet approved or cleared by the [place] FDA and has been authorized for detection and/or diagnosis of SARS-CoV-2 by FDA under an Emergency Use Authorization (EUA). This EUA will remain in effect (meaning this test can be used) for the duration of the COVID-19 declaration under Section 564(b)(1) of the Act, 21 U.S.C. section 360bbb-3(b)(1), unless the authorization is terminated or revoked.  Performed at [facility], [address] [place], [place] [address]   Urine Culture     Status: Abnormal   Collection Time: 03/10/21  8:00 AM   Specimen: Urine, Clean Catch  Result Value Ref Range Status   Specimen Description   Final    URINE, CLEAN CATCH Performed at [facility], [address] [place], [place] [address]    Special Requests   Final    NONE Performed at [facility], [address] [place], [place] [address]     Culture 10,000 COLONIES/mL ENTEROCOCCUS FAECALIS (A)  Final   Report Status  03/13/2021 FINAL  Final   Organism ID, Bacteria ENTEROCOCCUS FAECALIS (A)  Final      Susceptibility   Enterococcus faecalis - MIC*    AMPICILLIN <=2 SENSITIVE Sensitive     NITROFURANTOIN <=16 SENSITIVE Sensitive     VANCOMYCIN 1 SENSITIVE Sensitive     * 10,000 COLONIES/mL ENTEROCOCCUS FAECALIS  Urine Culture     Status: None   Collection Time: 03/10/21  1:55 PM   Specimen: Urine, Clean Catch  Result Value Ref Range Status   Specimen Description   Final    URINE, CLEAN CATCH Performed at [facility], [place] [address] [place], [place] [address]    Special Requests   Final    NONE Performed at [facility], [place] [address], [place], [place] [address]    Culture   Final    NO GROWTH Performed at [facility], [place] [address], [place], [place] [address]    Report Status 03/12/2021 FINAL  Final  Culture, blood (Routine X 2) w Reflex to ID Panel     Status: None (Preliminary result)   Collection Time: 03/12/21 11:15 AM   Specimen: BLOOD  Result Value Ref Range Status   Specimen Description   Final    BLOOD BLOOD RIGHT HAND Performed at [place] [address], [place], [place] [address]    Special Requests   Final    BOTTLES DRAWN AEROBIC ONLY Blood Culture results may not be optimal due to an inadequate volume of blood received in culture bottles Performed at [place] [address], [place], [place] [address]    Culture   Final    NO GROWTH 4 DAYS Performed at [facility], [place] [address], [place], [place] [address]    Report Status PENDING  Incomplete  Culture, blood (Routine X 2) w Reflex to ID Panel     Status: None (Preliminary result)   Collection Time: 03/12/21 11:15 AM   Specimen: BLOOD  Result Value Ref Range Status   Specimen Description   Final    BLOOD RIGHT ANTECUBITAL Performed at [place] [address] [place], [place] [address]    Special Requests   Final    BOTTLES DRAWN AEROBIC ONLY Blood Culture results may not be optimal due to an inadequate volume of blood received in culture bottles Performed at [place] [address] [place], [place] [address]    Culture   Final    NO GROWTH 4 DAYS Performed at [facility], [place] [address] [place],  [address]    Report Status PENDING  Incomplete  Urine Culture     Status: None   Collection Time: 03/12/21  1:08 PM   Specimen: Urine, Catheterized  Result Value Ref Range Status   Specimen Description   Final    URINE, CATHETERIZED Performed at [facility], [place] [address], [place],  [address]    Special Requests   Final    NONE Performed at [facility], [place] [address] [place],  [address]    Culture   Final    NO GROWTH Performed at [facility], [place] [address], [place],  [address]    Report Status 03/13/2021 FINAL  Final  MRSA Next Gen by PCR, Nasal     Status: None   Collection Time: 03/13/21  9:08 AM   Specimen: Nasal Mucosa; Nasal Swab  Result Value Ref Range Status   MRSA by PCR Next Gen NOT DETECTED NOT DETECTED Final    Comment: (NOTE) The GeneXpert MRSA Assay (FDA approved for NASAL specimens only), is one component of a comprehensive MRSA colonization surveillance program. It is not intended to diagnose MRSA infection nor to guide or monitor treatment for MRSA infections. Test performance is not FDA approved in patients less than 78 years old. Performed at [facility], [place] [address] [place], [place] [address]          Radiology Studies: DG Chest 2 View  Result Date: 03/14/2021 CLINICAL DATA:  Cough EXAM: CHEST - 2 VIEW COMPARISON:  03/12/2021 FINDINGS: Low lung volumes. Persistent bibasilar opacities, left greater than right. Trace pleural effusions.  Similar cardiomediastinal contours. IMPRESSION: Persistent bibasilar atelectasis/consolidation. Trace bilateral pleural effusions. Electronically Signed   By: Macy Mis M.D.   On: 03/14/2021 16:13        Scheduled Meds:  acidophilus  2 capsule Oral Daily   enoxaparin (LOVENOX) injection  40 mg Subcutaneous Q24H   fluticasone  2 spray Each Nare Daily   furosemide  20 mg Intravenous Once   guaiFENesin  1,200 mg Oral BID   ipratropium-albuterol  3 mL Nebulization TID   lamoTRIgine  300 mg Oral BID   loratadine  10 mg Oral Daily   minoxidil  40 mg Oral Daily   pantoprazole  40 mg Oral Q0600   polycarbophil  625 mg Oral Daily   potassium chloride  40 mEq Oral Once   prazosin  1 mg Oral QHS   prednisoLONE acetate  1 drop Left Eye QHS   primidone  50 mg Oral BID   simvastatin  20 mg Oral Daily   Continuous Infusions:  ceFEPime (MAXIPIME) IV       LOS: 6 days    Time spent: 35 minutes    Geradine Girt, DO [facility]   To contact the attending provider between 7A-7P or the covering provider during after hours 7P-7A, please log into the web site [email or web address] and access using universal  password for that web site. If you do not have the password, please call the hospital operator.  03/16/2021, 1:42 PM

## 2021-03-17 LAB — CULTURE, BLOOD (ROUTINE X 2)
Culture: NO GROWTH
Culture: NO GROWTH

## 2021-03-17 LAB — BASIC METABOLIC PANEL
Anion gap: 8 (ref 5–15)
BUN: 10 mg/dL (ref 8–23)
CO2: 25 mmol/L (ref 22–32)
Calcium: 8.2 mg/dL — ABNORMAL LOW (ref 8.9–10.3)
Chloride: 102 mmol/L (ref 98–111)
Creatinine, Ser: 0.9 mg/dL (ref 0.61–1.24)
GFR, Estimated: >60 mL/min (ref >60)
Glucose, Bld: 109 mg/dL — ABNORMAL HIGH (ref 70–99)
Potassium: 3.5 mmol/L (ref 3.5–5.1)
Sodium: 135 mmol/L (ref 135–145)

## 2021-03-17 LAB — BRAIN NATRIURETIC PEPTIDE: B Natriuretic Peptide: 257 pg/mL — ABNORMAL HIGH (ref 0.0–100.0)

## 2021-03-17 MED ORDER — POTASSIUM CHLORIDE CRYS ER 20 MEQ PO TBCR
40.0000 meq | EXTENDED_RELEASE_TABLET | Freq: Once | ORAL | Status: AC
  Administered 2021-03-17: 40 meq via ORAL
  Filled 2021-03-17: qty 2

## 2021-03-17 MED ORDER — TRAZODONE HCL 50 MG PO TABS: 25.0000 mg | ORAL_TABLET | Freq: Every evening | ORAL | Status: DC | PRN

## 2021-03-17 MED ORDER — FUROSEMIDE 10 MG/ML IJ SOLN
40.0000 mg | Freq: Every day | INTRAMUSCULAR | Status: DC
  Administered 2021-03-17 – 2021-03-18 (×2): 40 mg via INTRAVENOUS
  Filled 2021-03-17 (×2): qty 4

## 2021-03-17 NOTE — Progress Notes (Signed)
PROGRESS NOTE    [name]  [id number] DOB: Dec 09, 1945 DOA: 03/09/2021 PCP: Deland Pretty, MD    Chief Complaint  Patient presents with   Dizziness   Weakness    Brief Narrative:  Patient is a very pleasant 75 year old gentleman history of hypertension, hyperlipidemia, neuropathy, OSA presented with dyspnea, confusion.  It was noted that 3 days prior to admission patient's family noticed patient was fatigue, decreased appetite, no nausea or vomiting, development of generalized weakness with ambulation and noted to be hypoxic.  Slow to improve   Assessment & Plan:   Principal Problem:   Acute respiratory failure with hypoxia (HCC) Active Problems:   OSA (obstructive sleep apnea)   Essential hypertension   Hypercholesterolemia   Acute metabolic encephalopathy   CAP (community acquired pneumonia)   Acute renal failure superimposed on stage 3a chronic kidney disease (HCC)   Hypotension    acute hypoxic respiratory failure secondary to probable CAP -Patient presented with confusion, generalized weakness, hypoxia, initially required 6 L high flow nasal cannula-- has been up to 15L HF -D-dimer noted to be elevated at 2.54. -CT angiogram chest negative for PE with small left pleural effusion and bibasilar atelectasis/scarring. -WBC within normal limits. -Blood cultures NGTD -Urine Legionella antigen negative. -Urine pneumococcus antigen negative. -Urine cultures with 10,000 colonies Enterococcus faecalis. -change to cefepime -aggressive pulm toilet- encouraged to do more-pulm looked over charts and think time is needed and no recommendations for medication changes needed -BNP elevated, x ray shows worsening atelectasis/infiltrates-- last echo showed EF preserved but grade 2 diastolic dfxn - IV lasix again today- -- weight trend from early in admission not accurate -O2 sats better with ambulation so patient needs to be up walking and moving   Hypotension -resolved,  monitor  Acute metabolic encephalopathy -avoid morphine  Hyperlipidemia -Statin.  Hypertension -monitor with diuresis  chronic kidney disease stage IIIa -daily labs  Gout -Allopurinol on hold due to AKI.   OSA CPAP nightly.  Urinary retention -resolved  Hypokalemia -replete  DVT prophylaxis: Lovenox Code Status: Full Family Communication: Updated patient, wife, daughter at bedside. Disposition:   Status is: Inpatient  Remains inpatient appropriate because:Inpatient level of care appropriate due to severity of illness  Dispo: The patient is from: Home              Anticipated d/c is to: Home              Patient currently is not medically stable to d/c.   Difficult to place patient No       Consultants:  None  Procedures:  CT angiogram chest 03/11/2021 Chest x-ray 03/09/2021 CT head 03/09/2021 Renal ultrasound 03/10/2021  Antimicrobials:  IV Rocephin 03/10/2021>>>> IV vancomycin 03/11/2021>>>>>   Subjective: Has episode of hypoxia down to 89% on RA  Objective: Vitals:   03/17/21 0458 03/17/21 0515 03/17/21 0742 03/17/21 1301  BP:  (!) 134/53  (!) 133/58  Pulse:  94  96  Resp:  20  16  Temp:  98.2 F (36.8 C)  98.8 F (37.1 C)  TempSrc:  Oral  Oral  SpO2:  97% 91% 96%  Weight: 89.7 kg     Height:        Intake/Output Summary (Last 24 hours) at 03/17/2021 1356 Last data filed at 03/17/2021 1326 Gross per 24 hour  Intake 340 ml  Output 800 ml  Net -460 ml   Filed Weights   03/16/21 0500 03/16/21 1418 03/17/21 0458  Weight: 91.4 kg 91.3 kg  89.7 kg    Examination:    General: Appearance:     Overweight male in no acute distress     Lungs:     diminished, respirations unlabored  Heart:    Normal heart rate.    MS:   All extremities are intact.    Neurologic:   Awake, alert, oriented x 3. No apparent focal neurological           defect.         Data Reviewed: I have personally reviewed following labs and imaging  studies  CBC: Recent Labs  Lab 03/11/21 0537 03/12/21 0452 03/13/21 0413 03/14/21 0432 03/15/21 1137  WBC 6.5 5.2 6.6 6.2 10.5  NEUTROABS  --  4.1 5.4  --   --   HGB 11.8* 10.2* 10.3* 9.6* 11.0*  HCT 35.9* 30.9* 30.6* 28.6* 32.0*  MCV 93.0 94.8 91.9 93.2 91.2  PLT 126* 126* 126* 136* [id number]    Basic Metabolic Panel: Recent Labs  Lab 03/11/21 0537 03/12/21 0452 03/13/21 0413 03/14/21 0432 03/15/21 1137 03/16/21 0447 03/17/21 0508  NA 135 135 136 136 135 135 135  K 4.3 4.0 4.0 3.6 3.1* 3.5 3.5  CL 104 106 106 106 103 104 102  CO2 '26 24 24 22 24 24 25  '$ GLUCOSE 102* 107* 114* 115* 123* 122* 109*  BUN 28* 29* 26* 31* '19 15 10  '$ CREATININE 1.28* 1.27* 1.09 1.33* 0.92 0.98 0.90  CALCIUM 8.4* 7.8* 8.3* 8.2* 8.5* 8.2* 8.2*  MG 2.2 2.2  --   --   --   --   --   PHOS  --   --  2.9  --   --   --   --     GFR: Estimated Creatinine Clearance: 74.4 mL/min (by C-G formula based on SCr of 0.9 mg/dL).  Liver Function Tests: Recent Labs  Lab 03/10/21 1415 03/11/21 0537 03/13/21 0413  AST 17 18  --   ALT 13 13  --   ALKPHOS 88 87  --   BILITOT 0.7 0.9  --   PROT 6.7 6.3*  --   ALBUMIN 3.7 3.4* 3.0*    CBG: Recent Labs  Lab 03/11/21 2019  GLUCAP 180*     Recent Results (from the past 240 hour(s))  Culture, blood (routine x 2)     Status: None   Collection Time: 03/09/21  9:25 PM   Specimen: BLOOD  Result Value Ref Range Status   Specimen Description   Final    BLOOD RIGHT ANTECUBITAL Performed at [facility], [address] [place], [place] [address]    Special Requests   Final    BOTTLES DRAWN AEROBIC AND ANAEROBIC Blood Culture adequate volume Performed at [facility], [address] [place], [place] [address]    Culture   Final    NO GROWTH 5 DAYS Performed at [facility], [place] [address], [place], [place] [address]    Report Status 03/15/2021 FINAL  Final  Culture, blood (routine x 2)     Status: None   Collection Time:  03/09/21  9:30 PM   Specimen: BLOOD  Result Value Ref Range Status   Specimen Description   Final    BLOOD BLOOD RIGHT FOREARM Performed at [facility], [address] [place], [place] [address]    Special Requests   Final    BOTTLES DRAWN AEROBIC ONLY Blood Culture results may not be  optimal due to an inadequate volume of blood received in culture bottles Performed at [facility], [address] [place], [place] [address]    Culture   Final    NO GROWTH 5 DAYS Performed at [facility], [place] [address] [place], [place] [address]    Report Status 03/15/2021 FINAL  Final  Resp Panel by RT-PCR (Flu A&B, Covid) Nasopharyngeal Swab     Status: None   Collection Time: 03/09/21 11:25 PM   Specimen: Nasopharyngeal Swab; Nasopharyngeal(NP) swabs in vial transport medium  Result Value Ref Range Status   SARS Coronavirus 2 by RT PCR NEGATIVE NEGATIVE Final    Comment: (NOTE) SARS-CoV-2 target nucleic acids are NOT DETECTED.  The SARS-CoV-2 RNA is generally detectable in upper respiratory specimens during the acute phase of infection. The lowest concentration of SARS-CoV-2 viral copies this assay can detect is 138 copies/mL. A negative result does not preclude SARS-Cov-2 infection and should not be used as the sole basis for treatment or other patient management decisions. A negative result may occur with  improper specimen collection/handling, submission of specimen other than nasopharyngeal swab, presence of viral mutation(s) within the areas targeted by this assay, and inadequate number of viral copies(<138 copies/mL). A negative result must be combined with clinical observations, patient history, and epidemiological information. The expected result is Negative.  Fact Sheet for Patients:  EntrepreneurPulse.com.au  Fact Sheet for Healthcare Providers:  IncredibleEmployment.be  This test is no t yet approved or  cleared by the [place] FDA and  has been authorized for detection and/or diagnosis of SARS-CoV-2 by FDA under an Emergency Use Authorization (EUA). This EUA will remain  in effect (meaning this test can be used) for the duration of the COVID-19 declaration under Section 564(b)(1) of the Act, 21 U.S.C.section 360bbb-3(b)(1), unless the authorization is terminated  or revoked sooner.       Influenza A by PCR NEGATIVE NEGATIVE Final   Influenza B by PCR NEGATIVE NEGATIVE Final    Comment: (NOTE) The Xpert Xpress SARS-CoV-2/FLU/RSV plus assay is intended as an aid in the diagnosis of influenza from Nasopharyngeal swab specimens and should not be used as a sole basis for treatment. Nasal washings and aspirates are unacceptable for Xpert Xpress SARS-CoV-2/FLU/RSV testing.  Fact Sheet for Patients: EntrepreneurPulse.com.au  Fact Sheet for Healthcare Providers: IncredibleEmployment.be  This test is not yet approved or cleared by the [place] FDA and has been authorized for detection and/or diagnosis of SARS-CoV-2 by FDA under an Emergency Use Authorization (EUA). This EUA will remain in effect (meaning this test can be used) for the duration of the COVID-19 declaration under Section 564(b)(1) of the Act, 21 U.S.C. section 360bbb-3(b)(1), unless the authorization is terminated or revoked.  Performed at [facility], [address], [place], [place] [address]   Urine Culture     Status: Abnormal   Collection Time: 03/10/21  8:00 AM   Specimen: Urine, Clean Catch  Result Value Ref Range Status   Specimen Description   Final    URINE, CLEAN CATCH Performed at [facility], [address] [place], [place] [address]    Special Requests   Final    NONE Performed at [facility], [address] [place], [place] [address]    Culture 10,000 COLONIES/mL ENTEROCOCCUS FAECALIS (A)  Final   Report Status  03/13/2021 FINAL  Final   Organism ID, Bacteria ENTEROCOCCUS FAECALIS (A)  Final  Susceptibility   Enterococcus faecalis - MIC*    AMPICILLIN <=2 SENSITIVE Sensitive     NITROFURANTOIN <=16 SENSITIVE Sensitive     VANCOMYCIN 1 SENSITIVE Sensitive     * 10,000 COLONIES/mL ENTEROCOCCUS FAECALIS  Urine Culture     Status: None   Collection Time: 03/10/21  1:55 PM   Specimen: Urine, Clean Catch  Result Value Ref Range Status   Specimen Description   Final    URINE, CLEAN CATCH Performed at [facility], [place] [address] [place], [place] [address]    Special Requests   Final    NONE Performed at [facility], [place] [address], [place], [place] [address]    Culture   Final    NO GROWTH Performed at [facility], [place] [address], [place], [place] [address]    Report Status 03/12/2021 FINAL  Final  Culture, blood (Routine X 2) w Reflex to ID Panel     Status: None (Preliminary result)   Collection Time: 03/12/21 11:15 AM   Specimen: BLOOD  Result Value Ref Range Status   Specimen Description   Final    BLOOD BLOOD RIGHT HAND Performed at [place] [address], [place], [place] [address]    Special Requests   Final    BOTTLES DRAWN AEROBIC ONLY Blood Culture results may not be optimal due to an inadequate volume of blood received in culture bottles Performed at [place] [address] [place], [place] [address]    Culture   Final    NO GROWTH 4 DAYS Performed at [facility], [place] [address] [place], [place] [address]    Report Status PENDING  Incomplete  Culture, blood (Routine X 2) w Reflex to ID Panel     Status: None (Preliminary result)   Collection Time: 03/12/21 11:15 AM   Specimen: BLOOD  Result Value Ref Range Status   Specimen Description   Final    BLOOD RIGHT ANTECUBITAL Performed at [place] [address], [place], [place] [address]     Special Requests   Final    BOTTLES DRAWN AEROBIC ONLY Blood Culture results may not be optimal due to an inadequate volume of blood received in culture bottles Performed at [place] [address], [place], [place] [address]    Culture   Final    NO GROWTH 4 DAYS Performed at [facility], [place] [address], [place], [place] [address]    Report Status PENDING  Incomplete  Urine Culture     Status: None   Collection Time: 03/12/21  1:08 PM   Specimen: Urine, Catheterized  Result Value Ref Range Status   Specimen Description   Final    URINE, CATHETERIZED Performed at [facility], [place] [address] [place], [place] [address]    Special Requests   Final    NONE Performed at [facility], [place] [address], [place], [place] [address]    Culture   Final    NO GROWTH Performed at [facility], [place] [address], [place],  [address]    Report Status 03/13/2021 FINAL  Final  MRSA Next Gen by PCR, Nasal     Status: None   Collection Time: 03/13/21  9:08 AM   Specimen: Nasal Mucosa; Nasal Swab  Result Value Ref Range Status   MRSA by PCR Next Gen NOT DETECTED NOT DETECTED Final  Comment: (NOTE) The GeneXpert MRSA Assay (FDA approved for NASAL specimens only), is one component of a comprehensive MRSA colonization surveillance program. It is not intended to diagnose MRSA infection nor to guide or monitor treatment for MRSA infections. Test performance is not FDA approved in patients less than 89 years old. Performed at [facility], [place] [address] [place], [place] [address]          Radiology Studies: No results found.      Scheduled Meds:  acidophilus  2 capsule Oral Daily   enoxaparin (LOVENOX) injection  40 mg Subcutaneous Q24H   fluticasone  2 spray Each Nare Daily   furosemide  40 mg Intravenous Daily   guaiFENesin  1,200 mg Oral BID   ipratropium-albuterol  3 mL  Nebulization TID   lamoTRIgine  300 mg Oral BID   loratadine  10 mg Oral Daily   minoxidil  40 mg Oral Daily   pantoprazole  40 mg Oral Q0600   polycarbophil  625 mg Oral Daily   prazosin  1 mg Oral QHS   prednisoLONE acetate  1 drop Left Eye QHS   primidone  50 mg Oral BID   simvastatin  20 mg Oral Daily   Continuous Infusions:  ceFEPime (MAXIPIME) IV 2 g (03/17/21 0504)     LOS: 7 days    Time spent: 35 minutes    Geradine Girt, DO [facility]   To contact the attending provider between 7A-7P or the covering provider during after hours 7P-7A, please log into the web site [email or web address] and access using universal [place] password for that web site. If you do not have the password, please call the hospital operator.  03/17/2021, 1:56 PM

## 2021-03-17 NOTE — Care Management Important Message (Signed)
Important Message  Patient Details IM Letter given to the Patient. Name: [name] MRN: [id number] Date of Birth: 05/30/1946   Medicare Important Message Given:  Yes     Kerin Salen 03/17/2021, 9:21 AM

## 2021-03-17 NOTE — Progress Notes (Signed)
   03/17/21 1400  Mobility  Activity Ambulated in hall  Level of Assistance Minimal assist, patient does 75% or more  Assistive Device Front wheel walker  Distance Ambulated (ft) 200 ft  Mobility Ambulated with assistance in hallway  Mobility Response Tolerated well  Mobility performed by Mobility specialist  $Mobility charge 1 Mobility   Pt states he feels much better than this morning. Transferred pt to [facility] upon entering, and then back to [facility]. Ambulated in hall about 255f with RW on 2L. Initially had some "weakness" in his legs he states is secondary to his neuropathy. Pt states that this is normal. One episode of SOB, took a standing walk break at this time. Left in bed with wife and daughter present, and call bell at side. No other complaints at this time.    Prior mobility: O2 95% During mobility: O2 92%, 93%, 91% Post mobility: O2 94%   KPlaya FortunaOffice: [phone]

## 2021-03-17 NOTE — Progress Notes (Signed)
   03/17/21 0330  Charting Type  Charting Type Reassessment  Neurological  Neuro (WDL) X  Orientation Level Disoriented to situation;Disoriented to time;Disoriented to place  Cognition Poor safety awareness;Poor judgement;Memory impairment  Speech Clear  NuDESC - Delirium Risk Factor Assessment (Complete for non-ICU patients)  Delirium Risk Factor Assessment Age greater than or equal to 88 years  NuDESC - Nursing Delirium Screening Scale (Complete for non-ICU patients)  Disorientation 1  Inappropriate Behavior 1  Inappropriate Communications 1  Illusions/hallucinations 1  Psychomotor Retardation 0  NuDESC Total Score 4  NuDESC - Delirium Prevention:  Universal Requirements (Complete for all non-ICU patients with a delirium risk factor)  Universal Precautions Initiated *See Row Information* Yes

## 2021-03-17 NOTE — Progress Notes (Signed)
At 0330 patient was standing in the doorway of his room and appeared to be confused. Patient's bed alarm was going off. He indicated that he had gotten up and fell down trying to find everyone. He thought everyone had gone and he initially could not tell me where he was was. Patient was assisted to the bathroom and during conversation he was oriented back to time, place and situation. He became more coherent and was able to recall conversation from earlier in the shift. He indicated that he had a bad dream. After about 15 minutes he was back and baseline and embarrassed about not knowing where he was. He also denied falling which was highly unlikely to begin with. Patient did receive ambien last night, however he states he takes it at home and it has never caused nightmares or confusion. Nursing will continue to monitor.

## 2021-03-17 NOTE — Plan of Care (Signed)
  Problem: Activity: Goal: Ability to tolerate increased activity will improve Outcome: Progressing   Problem: Clinical Measurements: Goal: Ability to maintain a body temperature in the normal range will improve Outcome: Progressing   Problem: Respiratory: Goal: Ability to maintain adequate ventilation will improve Outcome: Progressing Goal: Ability to maintain a clear airway will improve Outcome: Progressing   

## 2021-03-17 NOTE — Evaluation (Signed)
Physical Therapy Evaluation Patient Details Name: [name] MRN: [id number] DOB: June 06, 1946 Today's Date: 03/17/2021   History of Present Illness  75 year old gentleman admitted 03/09/21 for acute hypoxic respiratory failure secondary to probable CAP.  Past medical history of hypertension, hyperlipidemia, neuropathy, OSA  Clinical Impression  Pt admitted with above diagnosis. Pt currently with functional limitations due to the deficits listed below (see PT Problem List). Pt will benefit from skilled PT to increase their independence and safety with mobility to allow discharge to the venue listed below.  Pt ambulated in hallway and able to wean to 2L O2 [place] during session (RN notified and aware).  Pt reports confusion last night however feeling well and clear today.  Pt utilized RW today for a little more support so will recommend RW upon d/c however pt may progress to no needs.     Follow Up Recommendations No PT follow up    Equipment Recommendations  Rolling walker with 5" wheels    Recommendations for Other Services       Precautions / Restrictions Precautions Precautions: Fall Precaution Comments: monitor sats      Mobility  Bed Mobility Overal bed mobility: Needs Assistance Bed Mobility: Supine to Sit;Sit to Supine     Supine to sit: Supervision Sit to supine: Supervision        Transfers Overall transfer level: Needs assistance Equipment used: Rolling walker (2 wheeled) Transfers: Sit to/from Stand Sit to Stand: Min guard         General transfer comment: min/guard for safety, cues for hand placement  Ambulation/Gait Ambulation/Gait assistance: Min guard Gait Distance (Feet): 200 Feet Assistive device: Rolling walker (2 wheeled) Gait Pattern/deviations: Step-through pattern;Decreased stride length     General Gait Details: pt steady with RW and denies dyspnea, able to lower O2 liters from 4L to 2L with ambulating, [id number]% on 2L O2 [place] with  ambulation; RN notified of ambulatory sats and that pt was left on 2L O2 [place] with direct line from nasal cannula to wall (had multiple connections on arrival to room)  Stairs            Wheelchair Mobility    Modified Rankin (Stroke Patients Only)       Balance                                             Pertinent Vitals/Pain Pain Assessment: No/denies pain    Home Living Family/patient expects to be discharged to:: Private residence Living Arrangements: Spouse/significant other   Type of Home: House Home Access: Level entry     Home Layout: Able to live on main level with bedroom/bathroom;Two level Home Equipment: Cane - single point      Prior Function Level of Independence: Independent               Hand Dominance        Extremity/Trunk Assessment        Lower Extremity Assessment Lower Extremity Assessment: Generalized weakness    Cervical / Trunk Assessment Cervical / Trunk Assessment: Normal  Communication   Communication: No difficulties  Cognition Arousal/Alertness: Awake/alert Behavior During Therapy: WFL for tasks assessed/performed Overall Cognitive Status: Within Functional Limits for tasks assessed  General Comments      Exercises     Assessment/Plan    PT Assessment Patient needs continued PT services  PT Problem List Decreased knowledge of use of DME;Decreased activity tolerance;Decreased mobility;Cardiopulmonary status limiting activity       PT Treatment Interventions DME instruction;Therapeutic exercise;Gait training;Therapeutic activities;Functional mobility training;Patient/family education    PT Goals (Current goals can be found in the Care Plan section)  Acute Rehab PT Goals PT Goal Formulation: With patient Time For Goal Achievement: 03/31/21 Potential to Achieve Goals: Good    Frequency Min 3X/week   Barriers to discharge         Co-evaluation               AM-PAC PT "6 Clicks" Mobility  Outcome Measure Help needed turning from your back to your side while in a flat bed without using bedrails?: None Help needed moving from lying on your back to sitting on the side of a flat bed without using bedrails?: None Help needed moving to and from a bed to a chair (including a wheelchair)?: A Little Help needed standing up from a chair using your arms (e.g., wheelchair or bedside chair)?: A Little Help needed to walk in hospital room?: A Little Help needed climbing 3-5 steps with a railing? : A Little 6 Click Score: 20    End of Session Equipment Utilized During Treatment: Gait belt Activity Tolerance: Patient tolerated treatment well Patient left: with call bell/phone within reach;in bed;with family/visitor present Nurse Communication: Mobility status PT Visit Diagnosis: Difficulty in walking, not elsewhere classified (R26.2)    Time: [id number] PT Time Calculation (min) (ACUTE ONLY): 14 min   Charges:   PT Evaluation $PT Eval Low Complexity: 1 Low        Kati PT, DPT Acute Rehabilitation Services Pager: [phone] Office: [phone]   York Ram E 03/17/2021, 12:15 PM

## 2021-03-18 LAB — BASIC METABOLIC PANEL
Anion gap: 9 (ref 5–15)
BUN: 12 mg/dL (ref 8–23)
CO2: 26 mmol/L (ref 22–32)
Calcium: 7.9 mg/dL — ABNORMAL LOW (ref 8.9–10.3)
Chloride: 100 mmol/L (ref 98–111)
Creatinine, Ser: 0.95 mg/dL (ref 0.61–1.24)
GFR, Estimated: >60 mL/min (ref >60)
Glucose, Bld: 107 mg/dL — ABNORMAL HIGH (ref 70–99)
Potassium: 3.2 mmol/L — ABNORMAL LOW (ref 3.5–5.1)
Sodium: 135 mmol/L (ref 135–145)

## 2021-03-18 MED ORDER — POTASSIUM CHLORIDE CRYS ER 20 MEQ PO TBCR
40.0000 meq | EXTENDED_RELEASE_TABLET | Freq: Two times a day (BID) | ORAL | Status: DC
  Administered 2021-03-18 – 2021-03-20 (×5): 40 meq via ORAL
  Filled 2021-03-18 (×5): qty 2

## 2021-03-18 NOTE — Progress Notes (Signed)
Pt placed on home CPAP unit for hs, pt tolerating well, 3lpm cann bleed in. Rt will continue to monitor patient throughout the night

## 2021-03-18 NOTE — Progress Notes (Signed)
SATURATION QUALIFICATIONS: (This note is used to comply with regulatory documentation for home oxygen)  Patient Saturations on Room Air at Rest = 94%  Patient Saturations on Room Air while Ambulating = 86%  Patient Saturations on 2 Liters of oxygen while Ambulating = 93%  Please briefly explain why patient needs home oxygen:  Patient desats on room air with ambulation.

## 2021-03-18 NOTE — Progress Notes (Signed)
PROGRESS NOTE    [name]  [id number] DOB: Jul 06, 1946 DOA: 03/09/2021 PCP: Deland Pretty, MD    Chief Complaint  Patient presents with   Dizziness   Weakness    Brief Narrative:  Patient is a very pleasant 75 year old gentleman history of hypertension, hyperlipidemia, neuropathy, OSA presented with dyspnea, confusion.  It was noted that 3 days prior to admission patient's family noticed patient was fatigue, decreased appetite, no nausea or vomiting, development of generalized weakness with ambulation and noted to be hypoxic.  Slow to improve   Assessment & Plan:   Principal Problem:   Acute respiratory failure with hypoxia (HCC) Active Problems:   OSA (obstructive sleep apnea)   Essential hypertension   Hypercholesterolemia   Acute metabolic encephalopathy   CAP (community acquired pneumonia)   Acute renal failure superimposed on stage 3a chronic kidney disease (HCC)   Hypotension    acute hypoxic respiratory failure secondary to probable CAP -Patient presented with confusion, generalized weakness, hypoxia, initially required 6 L high flow nasal cannula-- has been up to 15L HF -D-dimer noted to be elevated at 2.54. -CT angiogram chest negative for PE with small left pleural effusion and bibasilar atelectasis/scarring. -WBC within normal limits. -Blood cultures NGTD -Urine Legionella antigen negative. -Urine pneumococcus antigen negative. -Urine cultures with 10,000 colonies Enterococcus faecalis. -change to cefepime -aggressive pulm toilet- encouraged to do more-pulm looked over charts and think time is needed and no recommendations for medication changes needed -BNP elevated, x ray shows worsening atelectasis/infiltrates-- last echo showed EF preserved but grade 2 diastolic dfxn - IV lasix again today- -- weight trend from early in admission not accurate -O2 sats better with ambulation so patient needs to be up walking and moving   Hypotension -resolved,  monitor  Acute metabolic encephalopathy -avoid morphine  Hyperlipidemia -Statin.  Hypertension -monitor with diuresis  chronic kidney disease stage IIIa -daily labs  Gout -Allopurinol on hold due to AKI.   OSA CPAP nightly.  Urinary retention -resolved  Hypokalemia -replete  DVT prophylaxis: Lovenox Code Status: Full Family Communication: Updated patient, wife at bedside. Disposition:   Status is: Inpatient  Remains inpatient appropriate because:Inpatient level of care appropriate due to severity of illness  Dispo: The patient is from: Home              Anticipated d/c is to: Home              Patient currently is not medically stable to d/c. - suspect will need to go home on O2   Difficult to place patient No       Consultants:  Pulm (phone)  Procedures:  CT angiogram chest 03/11/2021 Chest x-ray 03/09/2021 CT head 03/09/2021 Renal ultrasound 03/10/2021  Antimicrobials:  IV Rocephin 03/10/2021>>>> IV vancomycin 03/11/2021>>>>>   Subjective: Sleepy this AM, breathing seems stable on 2L  Objective: Vitals:   03/18/21 0500 03/18/21 0510 03/18/21 0822 03/18/21 1210  BP:  (!) 120/56  (!) 124/53  Pulse:  92  93  Resp:  18  16  Temp:  (!) 97.4 F (36.3 C)  99.2 F (37.3 C)  TempSrc:  Oral  Oral  SpO2:  97% 90% 95%  Weight: 89.1 kg     Height:        Intake/Output Summary (Last 24 hours) at 03/18/2021 1300 Last data filed at 03/18/2021 1246 Gross per 24 hour  Intake 420 ml  Output 2925 ml  Net -2505 ml   Filed Weights   03/16/21 1418 03/17/21  [id number] 03/18/21 0500  Weight: 91.3 kg 89.7 kg 89.1 kg    Examination:    General: Appearance:     Overweight male in no acute distress     Lungs:     diminished, respirations unlabored  Heart:    Normal heart rate.    MS:   All extremities are intact.    Neurologic:   Awake, alert, oriented x 3. No apparent focal neurological           defect.         Data Reviewed: I have personally reviewed  following labs and imaging studies  CBC: Recent Labs  Lab 03/12/21 0452 03/13/21 0413 03/14/21 0432 03/15/21 1137  WBC 5.2 6.6 6.2 10.5  NEUTROABS 4.1 5.4  --   --   HGB 10.2* 10.3* 9.6* 11.0*  HCT 30.9* 30.6* 28.6* 32.0*  MCV 94.8 91.9 93.2 91.2  PLT 126* 126* 136* [id number]    Basic Metabolic Panel: Recent Labs  Lab 03/12/21 0452 03/13/21 0413 03/14/21 0432 03/15/21 1137 03/16/21 0447 03/17/21 0508 03/18/21 0547  NA 135 136 136 135 135 135 135  K 4.0 4.0 3.6 3.1* 3.5 3.5 3.2*  CL 106 106 106 103 104 102 100  CO2 '24 24 22 24 24 25 26  '$ GLUCOSE 107* 114* 115* 123* 122* 109* 107*  BUN 29* 26* 31* '19 15 10 12  '$ CREATININE 1.27* 1.09 1.33* 0.92 0.98 0.90 0.95  CALCIUM 7.8* 8.3* 8.2* 8.5* 8.2* 8.2* 7.9*  MG 2.2  --   --   --   --   --   --   PHOS  --  2.9  --   --   --   --   --     GFR: Estimated Creatinine Clearance: 70.5 mL/min (by C-G formula based on SCr of 0.95 mg/dL).  Liver Function Tests: Recent Labs  Lab 03/13/21 0413  ALBUMIN 3.0*    CBG: Recent Labs  Lab 03/11/21 2019  GLUCAP 180*     Recent Results (from the past 240 hour(s))  Culture, blood (routine x 2)     Status: None   Collection Time: 03/09/21  9:25 PM   Specimen: BLOOD  Result Value Ref Range Status   Specimen Description   Final    BLOOD RIGHT ANTECUBITAL Performed at [facility], [address] [place], [place] [address]    Special Requests   Final    BOTTLES DRAWN AEROBIC AND ANAEROBIC Blood Culture adequate volume Performed at [facility], [address] [place], [place] [address]    Culture   Final    NO GROWTH 5 DAYS Performed at [facility], [place] [address], [place], [place] [address]    Report Status 03/15/2021 FINAL  Final  Culture, blood (routine x 2)     Status: None   Collection Time: 03/09/21  9:30 PM   Specimen: BLOOD  Result Value Ref Range Status   Specimen Description   Final    BLOOD BLOOD RIGHT FOREARM Performed at [facility], [address] [place], [place] [address]    Special Requests   Final    BOTTLES DRAWN AEROBIC ONLY Blood Culture results may not be optimal due to an inadequate volume of blood received in culture bottles Performed at [facility], [address] [place], [place] [address]    Culture   Final    NO GROWTH 5 DAYS  Performed at [facility], [place] [address], [place], [place] [address]    Report Status 03/15/2021 FINAL  Final  Resp Panel by RT-PCR (Flu A&B, Covid) Nasopharyngeal Swab     Status: None   Collection Time: 03/09/21 11:25 PM   Specimen: Nasopharyngeal Swab; Nasopharyngeal(NP) swabs in vial transport medium  Result Value Ref Range Status   SARS Coronavirus 2 by RT PCR NEGATIVE NEGATIVE Final    Comment: (NOTE) SARS-CoV-2 target nucleic acids are NOT DETECTED.  The SARS-CoV-2 RNA is generally detectable in upper respiratory specimens during the acute phase of infection. The lowest concentration of SARS-CoV-2 viral copies this assay can detect is 138 copies/mL. A negative result does not preclude SARS-Cov-2 infection and should not be used as the sole basis for treatment or other patient management decisions. A negative result may occur with  improper specimen collection/handling, submission of specimen other than nasopharyngeal swab, presence of viral mutation(s) within the areas targeted by this assay, and inadequate number of viral copies(<138 copies/mL). A negative result must be combined with clinical observations, patient history, and epidemiological information. The expected result is Negative.  Fact Sheet for Patients:  EntrepreneurPulse.com.au  Fact Sheet for Healthcare Providers:  IncredibleEmployment.be  This test is no t yet approved or cleared by the [place] FDA and  has been authorized for detection and/or diagnosis of SARS-CoV-2 by FDA under an Emergency Use Authorization (EUA). This  EUA will remain  in effect (meaning this test can be used) for the duration of the COVID-19 declaration under Section 564(b)(1) of the Act, 21 U.S.C.section 360bbb-3(b)(1), unless the authorization is terminated  or revoked sooner.       Influenza A by PCR NEGATIVE NEGATIVE Final   Influenza B by PCR NEGATIVE NEGATIVE Final    Comment: (NOTE) The Xpert Xpress SARS-CoV-2/FLU/RSV plus assay is intended as an aid in the diagnosis of influenza from Nasopharyngeal swab specimens and should not be used as a sole basis for treatment. Nasal washings and aspirates are unacceptable for Xpert Xpress SARS-CoV-2/FLU/RSV testing.  Fact Sheet for Patients: EntrepreneurPulse.com.au  Fact Sheet for Healthcare Providers: IncredibleEmployment.be  This test is not yet approved or cleared by the [place] FDA and has been authorized for detection and/or diagnosis of SARS-CoV-2 by FDA under an Emergency Use Authorization (EUA). This EUA will remain in effect (meaning this test can be used) for the duration of the COVID-19 declaration under Section 564(b)(1) of the Act, 21 U.S.C. section 360bbb-3(b)(1), unless the authorization is terminated or revoked.  Performed at [facility], [address] [place], [place] [address]   Urine Culture     Status: Abnormal   Collection Time: 03/10/21  8:00 AM   Specimen: Urine, Clean Catch  Result Value Ref Range Status   Specimen Description   Final    URINE, CLEAN CATCH Performed at [facility], [address] [place], [place] [address]    Special Requests   Final    NONE Performed at [facility], [address] [place], [place] [address]    Culture 10,000 COLONIES/mL ENTEROCOCCUS FAECALIS (A)  Final   Report Status 03/13/2021 FINAL  Final   Organism ID, Bacteria ENTEROCOCCUS FAECALIS (A)  Final      Susceptibility   Enterococcus faecalis - MIC*    AMPICILLIN <=2 SENSITIVE  Sensitive     NITROFURANTOIN <=16 SENSITIVE Sensitive     VANCOMYCIN 1 SENSITIVE Sensitive     *  10,000 COLONIES/mL ENTEROCOCCUS FAECALIS  Urine Culture     Status: None   Collection Time: 03/10/21  1:55 PM   Specimen: Urine, Clean Catch  Result Value Ref Range Status   Specimen Description   Final    URINE, CLEAN CATCH Performed at [facility], [place] [address], [place], [place] [address]    Special Requests   Final    NONE Performed at [facility], [place] [address], [place], [place] [address]    Culture   Final    NO GROWTH Performed at [facility], [place] [address] [place], [place] [address]    Report Status 03/12/2021 FINAL  Final  Culture, blood (Routine X 2) w Reflex to ID Panel     Status: None   Collection Time: 03/12/21 11:15 AM   Specimen: BLOOD  Result Value Ref Range Status   Specimen Description   Final    BLOOD BLOOD RIGHT HAND Performed at [place] [address], [place], [place] [address]    Special Requests   Final    BOTTLES DRAWN AEROBIC ONLY Blood Culture results may not be optimal due to an inadequate volume of blood received in culture bottles Performed at [place] [address], [place], [place] [address]    Culture   Final    NO GROWTH 5 DAYS Performed at [facility], [place] [address], [place], [place] [address]    Report Status 03/17/2021 FINAL  Final  Culture, blood (Routine X 2) w Reflex to ID Panel     Status: None   Collection Time: 03/12/21 11:15 AM   Specimen: BLOOD  Result Value Ref Range Status   Specimen Description   Final    BLOOD RIGHT ANTECUBITAL Performed at [place] [address], [place], [place] [address]    Special Requests   Final    BOTTLES DRAWN AEROBIC ONLY Blood Culture results may not be optimal due to an inadequate volume of blood received in culture bottles Performed at [place] [address] [place], [place] [address]    Culture   Final    NO GROWTH 5 DAYS Performed at [facility], [place] [address], [place], [place] [address]    Report Status 03/17/2021 FINAL  Final  Urine Culture     Status: None   Collection Time: 03/12/21  1:08 PM   Specimen: Urine, Catheterized  Result Value Ref Range Status   Specimen Description   Final    URINE, CATHETERIZED Performed at [place] [address], [place], [place] [address]    Special Requests   Final    NONE Performed at [facility], [place] [address] [place], [place] [address]    Culture   Final    NO GROWTH Performed at [facility], [place] [address] [place], [place] [address]    Report Status 03/13/2021 FINAL  Final  MRSA Next Gen by PCR, Nasal     Status: None   Collection Time: 03/13/21  9:08 AM   Specimen: Nasal Mucosa; Nasal Swab  Result Value Ref Range Status   MRSA by PCR Next Gen NOT DETECTED NOT DETECTED Final    Comment: (NOTE) The GeneXpert MRSA Assay (FDA approved for NASAL specimens only), is one component of a comprehensive MRSA colonization surveillance program. It is not intended to diagnose MRSA infection nor to guide or monitor  treatment for MRSA infections. Test performance is not FDA approved in patients less than 40 years old. Performed at [facility], [place] [address] [place], [place] [address]          Radiology Studies: No results found.      Scheduled Meds:  acidophilus  2 capsule Oral Daily   enoxaparin (LOVENOX) injection  40 mg Subcutaneous Q24H   fluticasone  2 spray Each Nare Daily   furosemide  40 mg Intravenous Daily   guaiFENesin  1,200 mg Oral BID   ipratropium-albuterol  3 mL Nebulization TID   lamoTRIgine  300 mg Oral BID   loratadine  10 mg Oral Daily   minoxidil  40 mg Oral Daily   pantoprazole  40 mg Oral Q0600   polycarbophil  625 mg Oral Daily   potassium chloride   40 mEq Oral BID   prazosin  1 mg Oral QHS   prednisoLONE acetate  1 drop Left Eye QHS   primidone  50 mg Oral BID   simvastatin  20 mg Oral Daily   Continuous Infusions:  ceFEPime (MAXIPIME) IV 2 g (03/18/21 0540)     LOS: 8 days    Time spent: 35 minutes    Geradine Girt, DO [facility]   To contact the attending provider between 7A-7P or the covering provider during after hours 7P-7A, please log into the web site [email or web address] and access using universal [place] password for that web site. If you do not have the password, please call the hospital operator.  03/18/2021, 1:00 PM

## 2021-03-19 LAB — EXPECTORATED SPUTUM ASSESSMENT W GRAM STAIN, RFLX TO RESP C

## 2021-03-19 LAB — BASIC METABOLIC PANEL
Anion gap: 7 (ref 5–15)
BUN: 12 mg/dL (ref 8–23)
CO2: 30 mmol/L (ref 22–32)
Calcium: 8.1 mg/dL — ABNORMAL LOW (ref 8.9–10.3)
Chloride: 99 mmol/L (ref 98–111)
Creatinine, Ser: 1.07 mg/dL (ref 0.61–1.24)
GFR, Estimated: >60 mL/min (ref >60)
Glucose, Bld: 105 mg/dL — ABNORMAL HIGH (ref 70–99)
Potassium: 3.8 mmol/L (ref 3.5–5.1)
Sodium: 136 mmol/L (ref 135–145)

## 2021-03-19 MED ORDER — FUROSEMIDE 40 MG PO TABS
40.0000 mg | ORAL_TABLET | Freq: Every day | ORAL | Status: DC
  Administered 2021-03-19 – 2021-03-20 (×2): 40 mg via ORAL
  Filled 2021-03-19 (×2): qty 1

## 2021-03-19 NOTE — Progress Notes (Signed)
Placed patient on home CPAP for the night with oxygen set at 3lpm.

## 2021-03-19 NOTE — Progress Notes (Signed)
PROGRESS NOTE    [name]  [id number] DOB: [birth year] DOA: 03/09/2021 PCP: Deland Pretty, MD    Chief Complaint  Patient presents with   Dizziness   Weakness    Brief Narrative:  Patient is a very pleasant 75 year old gentleman history of hypertension, hyperlipidemia, neuropathy, OSA presented with dyspnea, confusion.  It was noted that 3 days prior to admission patient's family noticed patient was fatigue, decreased appetite, no nausea or vomiting, development of generalized weakness with ambulation and noted to be hypoxic.  Slow to improve   Assessment & Plan:   Principal Problem:   Acute respiratory failure with hypoxia (HCC) Active Problems:   OSA (obstructive sleep apnea)   Essential hypertension   Hypercholesterolemia   Acute metabolic encephalopathy   CAP (community acquired pneumonia)   Acute renal failure superimposed on stage 3a chronic kidney disease (HCC)   Hypotension    acute hypoxic respiratory failure secondary to probable CAP -Patient presented with confusion, generalized weakness, hypoxia, initially required 6 L high flow nasal cannula-- has been up to 15L HF -D-dimer noted to be elevated at 2.54. -CT angiogram chest negative for PE with small left pleural effusion and bibasilar atelectasis/scarring. -WBC within normal limits. -Blood cultures NGTD -Urine Legionella antigen negative. -Urine pneumococcus antigen negative. -Urine cultures with 10,000 colonies Enterococcus faecalis. -change to cefepime -aggressive pulm toilet- encouraged to do more-pulm looked over charts and think time is needed and no recommendations for medication changes needed -BNP elevated, x ray shows worsening atelectasis/infiltrates-- last echo showed EF preserved but grade 2 diastolic dfxn - IV lasix again today- -- weight trend from early in admission not accurate -O2 sats better with ambulation so patient needs to be up walking and moving   Hypotension -resolved,  monitor  Acute metabolic encephalopathy -avoid morphine  Hyperlipidemia -Statin.  Hypertension -monitor with diuresis  chronic kidney disease stage IIIa -daily labs  Gout -Allopurinol on hold due to AKI.   OSA CPAP nightly.  Urinary retention -resolved  Hypokalemia -replete  DVT prophylaxis: Lovenox Code Status: Full Family Communication: Updated patient, wife at bedside. Disposition:   Status is: Inpatient  Remains inpatient appropriate because:Inpatient level of care appropriate due to severity of illness  Dispo: The patient is from: Home              Anticipated d/c is to: Home              Patient currently is not medically stable to d/c. - suspect will need to go home on O2- in AM   Difficult to place patient No       Consultants:  Pulm (phone)  Procedures:  CT angiogram chest 03/11/2021 Chest x-ray 03/09/2021 CT head 03/09/2021 Renal ultrasound 03/10/2021  Antimicrobials:  IV Rocephin 03/10/2021>>>> IV vancomycin 03/11/2021>>>>>   Subjective: Some soreness from coughing  Objective: Vitals:   03/19/21 0840 03/19/21 0843 03/19/21 0926 03/19/21 1140  BP:   (!) 134/111 140/71  Pulse:   (!) 106 93  Resp:    18  Temp:    98.7 F (37.1 C)  TempSrc:      SpO2: 91% 91%  96%  Weight:      Height:        Intake/Output Summary (Last 24 hours) at 03/19/2021 1450 Last data filed at 03/19/2021 0359 Gross per 24 hour  Intake 320 ml  Output 725 ml  Net -405 ml   Filed Weights   03/17/21 0458 03/18/21 0500 03/19/21 0531  Weight: 89.7 kg  89.1 kg 86 kg    Examination:    General: Appearance:     Overweight male in no acute distress     Lungs:    Diminished, no wheezing, respirations unlabored  Heart:    Normal heart rate. Normal rhythm. No murmurs, rubs, or gallops.    MS:   All extremities are intact.    Neurologic:   Awake, alert, oriented x 3          Data Reviewed: I have personally reviewed following labs and imaging  studies  CBC: Recent Labs  Lab 03/13/21 0413 03/14/21 0432 03/15/21 1137  WBC 6.6 6.2 10.5  NEUTROABS 5.4  --   --   HGB 10.3* 9.6* 11.0*  HCT 30.6* 28.6* 32.0*  MCV 91.9 93.2 91.2  PLT 126* 136* [id number]    Basic Metabolic Panel: Recent Labs  Lab 03/13/21 0413 03/14/21 0432 03/15/21 1137 03/16/21 0447 03/17/21 0508 03/18/21 0547 03/19/21 0503  NA 136   < > 135 135 135 135 136  K 4.0   < > 3.1* 3.5 3.5 3.2* 3.8  CL 106   < > 103 104 102 100 99  CO2 24   < > '24 24 25 26 30  '$ GLUCOSE 114*   < > 123* 122* 109* 107* 105*  BUN 26*   < > '19 15 10 12 12  '$ CREATININE 1.09   < > 0.92 0.98 0.90 0.95 1.07  CALCIUM 8.3*   < > 8.5* 8.2* 8.2* 7.9* 8.1*  PHOS 2.9  --   --   --   --   --   --    < > = values in this interval not displayed.    GFR: Estimated Creatinine Clearance: 62.6 mL/min (by C-G formula based on SCr of 1.07 mg/dL).  Liver Function Tests: Recent Labs  Lab 03/13/21 0413  ALBUMIN 3.0*    CBG: No results for input(s): GLUCAP in the last 168 hours.    Recent Results (from the past 240 hour(s))  Culture, blood (routine x 2)     Status: None   Collection Time: 03/09/21  9:25 PM   Specimen: BLOOD  Result Value Ref Range Status   Specimen Description   Final    BLOOD RIGHT ANTECUBITAL Performed at [facility], [address] [place], [place] [address]    Special Requests   Final    BOTTLES DRAWN AEROBIC AND ANAEROBIC Blood Culture adequate volume Performed at [facility], [address] [place], [place] [address]    Culture   Final    NO GROWTH 5 DAYS Performed at [facility], [place] [address], [place], [place] [address]    Report Status 03/15/2021 FINAL  Final  Culture, blood (routine x 2)     Status: None   Collection Time: 03/09/21  9:30 PM   Specimen: BLOOD  Result Value Ref Range Status   Specimen Description   Final    BLOOD BLOOD RIGHT FOREARM Performed at [facility], [address] [place],  [place] [address]    Special Requests   Final    BOTTLES DRAWN AEROBIC ONLY Blood Culture results may not be optimal due to an inadequate volume of blood received in culture bottles Performed at [facility], [address] [place], [place] [address]    Culture   Final    NO GROWTH 5 DAYS Performed at [facility], [address] [place], [place] [address]    Report Status 03/15/2021 FINAL  Final  Resp Panel by RT-PCR (Flu A&B, Covid) Nasopharyngeal Swab     Status: None   Collection Time: 03/09/21 11:25 PM   Specimen: Nasopharyngeal Swab; Nasopharyngeal(NP) swabs in vial transport medium  Result Value Ref Range Status   SARS Coronavirus 2 by RT PCR NEGATIVE NEGATIVE Final    Comment: (NOTE) SARS-CoV-2 target nucleic acids are NOT DETECTED.  The SARS-CoV-2 RNA is generally detectable in upper respiratory specimens during the acute phase of infection. The lowest concentration of SARS-CoV-2 viral copies this assay can detect is 138 copies/mL. A negative result does not preclude SARS-Cov-2 infection and should not be used as the sole basis for treatment or other patient management decisions. A negative result may occur with  improper specimen collection/handling, submission of specimen other than nasopharyngeal swab, presence of viral mutation(s) within the areas targeted by this assay, and inadequate number of viral copies(<138 copies/mL). A negative result must be combined with clinical observations, patient history, and epidemiological information. The expected result is Negative.  Fact Sheet for Patients:  EntrepreneurPulse.com.au  Fact Sheet for Healthcare Providers:  IncredibleEmployment.be  This test is no t yet approved or cleared by the [place] FDA and  has been authorized for detection and/or diagnosis of SARS-CoV-2 by FDA under an Emergency Use Authorization (EUA). This EUA will remain  in effect (meaning this test  can be used) for the duration of the COVID-19 declaration under Section 564(b)(1) of the Act, 21 U.S.C.section 360bbb-3(b)(1), unless the authorization is terminated  or revoked sooner.       Influenza A by PCR NEGATIVE NEGATIVE Final   Influenza B by PCR NEGATIVE NEGATIVE Final    Comment: (NOTE) The Xpert Xpress SARS-CoV-2/FLU/RSV plus assay is intended as an aid in the diagnosis of influenza from Nasopharyngeal swab specimens and should not be used as a sole basis for treatment. Nasal washings and aspirates are unacceptable for Xpert Xpress SARS-CoV-2/FLU/RSV testing.  Fact Sheet for Patients: EntrepreneurPulse.com.au  Fact Sheet for Healthcare Providers: IncredibleEmployment.be  This test is not yet approved or cleared by the [place] FDA and has been authorized for detection and/or diagnosis of SARS-CoV-2 by FDA under an Emergency Use Authorization (EUA). This EUA will remain in effect (meaning this test can be used) for the duration of the COVID-19 declaration under Section 564(b)(1) of the Act, 21 U.S.C. section 360bbb-3(b)(1), unless the authorization is terminated or revoked.  Performed at [facility], [address] [place], [place] [address]   Urine Culture     Status: Abnormal   Collection Time: 03/10/21  8:00 AM   Specimen: Urine, Clean Catch  Result Value Ref Range Status   Specimen Description   Final    URINE, CLEAN CATCH Performed at [facility], [address] [place], [place] [address]    Special Requests   Final    NONE Performed at [facility], [address] [place], [place] [address]    Culture 10,000 COLONIES/mL ENTEROCOCCUS FAECALIS (A)  Final   Report Status 03/13/2021 FINAL  Final   Organism ID, Bacteria ENTEROCOCCUS FAECALIS (A)  Final      Susceptibility   Enterococcus faecalis - MIC*    AMPICILLIN <=2 SENSITIVE Sensitive     NITROFURANTOIN <=16 SENSITIVE  Sensitive     VANCOMYCIN 1 SENSITIVE Sensitive     * 10,000 COLONIES/mL ENTEROCOCCUS FAECALIS  Urine Culture  Status: None   Collection Time: 03/10/21  1:55 PM   Specimen: Urine, Clean Catch  Result Value Ref Range Status   Specimen Description   Final    URINE, CLEAN CATCH Performed at [facility], [place] [address], [place], [place] [address]    Special Requests   Final    NONE Performed at [facility], [place] [address] [place], [place] [address]    Culture   Final    NO GROWTH Performed at [facility], [place] [address], [place], [place] [address]    Report Status 03/12/2021 FINAL  Final  Culture, blood (Routine X 2) w Reflex to ID Panel     Status: None   Collection Time: 03/12/21 11:15 AM   Specimen: BLOOD  Result Value Ref Range Status   Specimen Description   Final    BLOOD BLOOD RIGHT HAND Performed at [place] [address] [place], [place] [address]    Special Requests   Final    BOTTLES DRAWN AEROBIC ONLY Blood Culture results may not be optimal due to an inadequate volume of blood received in culture bottles Performed at [place] [address], [place], [place] [address]    Culture   Final    NO GROWTH 5 DAYS Performed at [facility], [place] [address], [place], [place] [address]    Report Status 03/17/2021 FINAL  Final  Culture, blood (Routine X 2) w Reflex to ID Panel     Status: None   Collection Time: 03/12/21 11:15 AM   Specimen: BLOOD  Result Value Ref Range Status   Specimen Description   Final    BLOOD RIGHT ANTECUBITAL Performed at [place] [address] [place], [place] [address]    Special Requests   Final    BOTTLES DRAWN AEROBIC ONLY Blood Culture results may not be optimal due to an inadequate volume of blood received in culture bottles Performed at [place] [address] [place], [place]  [address]    Culture   Final    NO GROWTH 5 DAYS Performed at [facility], [place] [address] [place], [place] [address]    Report Status 03/17/2021 FINAL  Final  Urine Culture     Status: None   Collection Time: 03/12/21  1:08 PM   Specimen: Urine, Catheterized  Result Value Ref Range Status   Specimen Description   Final    URINE, CATHETERIZED Performed at [place] [address], [place],  [address]    Special Requests   Final    NONE Performed at [facility], [place] [address], [place],  [address]    Culture   Final    NO GROWTH Performed at [facility], [place] [address] [place],  [address]    Report Status 03/13/2021 FINAL  Final  MRSA Next Gen by PCR, Nasal     Status: None   Collection Time: 03/13/21  9:08 AM   Specimen: Nasal Mucosa; Nasal Swab  Result Value Ref Range Status   MRSA by PCR Next Gen NOT DETECTED NOT DETECTED Final    Comment: (NOTE) The GeneXpert MRSA Assay (FDA approved for NASAL specimens only), is one component of a comprehensive MRSA colonization surveillance program. It is not intended to diagnose MRSA infection nor to guide or monitor treatment for MRSA infections. Test performance is not FDA approved in  patients less than 1 years old. Performed at [facility], [place] [address], [place], [place] [address]          Radiology Studies: No results found.      Scheduled Meds:  acidophilus  2 capsule Oral Daily   enoxaparin (LOVENOX) injection  40 mg Subcutaneous Q24H   fluticasone  2 spray Each Nare Daily   furosemide  40 mg Oral Daily   guaiFENesin  1,200 mg Oral BID   lamoTRIgine  300 mg Oral BID   loratadine  10 mg Oral Daily   minoxidil  40 mg Oral Daily   pantoprazole  40 mg Oral Q0600   polycarbophil  625 mg Oral Daily   potassium chloride  40 mEq Oral BID   prazosin  1 mg Oral QHS   prednisoLONE acetate  1 drop Left Eye QHS   primidone   50 mg Oral BID   simvastatin  20 mg Oral Daily   Continuous Infusions:  ceFEPime (MAXIPIME) IV 2 g (03/19/21 1338)     LOS: 9 days    Time spent: 35 minutes    Geradine Girt, DO [facility]   To contact the attending provider between 7A-7P or the covering provider during after hours 7P-7A, please log into the web site [email or web address] and access using universal [place] password for that web site. If you do not have the password, please call the hospital operator.  03/19/2021, 2:50 PM

## 2021-03-20 LAB — BASIC METABOLIC PANEL
Anion gap: 5 (ref 5–15)
BUN: 12 mg/dL (ref 8–23)
CO2: 30 mmol/L (ref 22–32)
Calcium: 8.2 mg/dL — ABNORMAL LOW (ref 8.9–10.3)
Chloride: 100 mmol/L (ref 98–111)
Creatinine, Ser: 1.16 mg/dL (ref 0.61–1.24)
GFR, Estimated: >60 mL/min (ref >60)
Glucose, Bld: 101 mg/dL — ABNORMAL HIGH (ref 70–99)
Potassium: 4.7 mmol/L (ref 3.5–5.1)
Sodium: 135 mmol/L (ref 135–145)

## 2021-03-20 MED ORDER — FUROSEMIDE 40 MG PO TABS: 40.0000 mg | ORAL_TABLET | Freq: Every day | ORAL | 0 refills | Status: DC

## 2021-03-20 MED ORDER — LORATADINE 10 MG PO TABS: 10.0000 mg | ORAL_TABLET | Freq: Every day | ORAL | Status: DC

## 2021-03-20 MED ORDER — POTASSIUM CHLORIDE CRYS ER 20 MEQ PO TBCR: 20.0000 meq | EXTENDED_RELEASE_TABLET | Freq: Every day | ORAL | 0 refills | Status: DC

## 2021-03-20 MED ORDER — PANTOPRAZOLE SODIUM 40 MG PO TBEC: 40.0000 mg | DELAYED_RELEASE_TABLET | Freq: Every day | ORAL | 0 refills | Status: DC

## 2021-03-20 MED ORDER — GUAIFENESIN-DM 100-10 MG/5ML PO SYRP
5.0000 mL | ORAL_SOLUTION | ORAL | 0 refills | Status: DC | PRN
Start: 2021-03-20 — End: 2021-08-09

## 2021-03-20 MED ORDER — ONDANSETRON HCL 4 MG PO TABS
4.0000 mg | ORAL_TABLET | Freq: Four times a day (QID) | ORAL | 0 refills | Status: DC | PRN
Start: 2021-03-20 — End: 2024-03-23

## 2021-03-20 NOTE — Progress Notes (Signed)
  Patient Details Name: [name] MRN: [id number] DOB: June 01, 1946   SATURATION QUALIFICATIONS: (This note is used to comply with regulatory documentation for home oxygen)  Patient Saturations on Room Air at Rest = 94%  Patient Saturations on Room Air while Ambulating = 83%  Patient Saturations on 2 Liters of oxygen while Ambulating = 93%  Please briefly explain why patient needs home oxygen: to improve oxygen saturations with physical activities such as ADLs and ambulation.   Harpreet Pompey,KATHrine E 03/20/2021, 11:14 AM Arlyce Dice, DPT Acute Rehabilitation Services Pager: [phone] Office: [phone]

## 2021-03-20 NOTE — TOC Transition Note (Addendum)
Transition of Care [facility]) - CM/SW Discharge Note   Patient Details  Name: [name] MRN: [id number] Date of Birth: [birth year]  [facility] CM/SW Contact:  Dessa Phi, RN Phone Number: 03/20/2021, 12:32 PM   Clinical Narrative:  d/c today with home 02-s=02 sats qualify,& home order placed. [employer] rep Thedore Mins aware of delivery of home 02 travel tank to rm prior d/c. No further CM needs.  1:30p-rw rep Zach with [employer] aware.    Final next level of care: Home/Self Care Barriers to Discharge: No Barriers Identified   Patient Goals and CMS Choice Patient states their goals for this hospitalization and ongoing recovery are:: go home CMS Medicare.gov Compare Post Acute Care list provided to:: Patient Represenative (must comment)    Discharge Placement                       Discharge Plan and Services   Discharge Planning Services: CM Consult                                 Social Determinants of Health (SDOH) Interventions     Readmission Risk Interventions No flowsheet data found.

## 2021-03-20 NOTE — Care Management Important Message (Signed)
Important Message  Patient Details IM Letter given to the Patient. Name: [name] MRN: [id number] Date of Birth: Jan 08, 1946   Medicare Important Message Given:  Yes     Kerin Salen 03/20/2021, 2:52 PM

## 2021-03-20 NOTE — Progress Notes (Signed)
Physical Therapy Treatment Patient Details Name: [name] MRN: [id number] DOB: [birth year] Today's Date: 03/20/2021    History of Present Illness 75 year old gentleman admitted 03/09/21 for acute hypoxic respiratory failure secondary to probable CAP.  Past medical history of hypertension, hyperlipidemia, neuropathy, OSA    PT Comments    Pt assisted with ambulating in hallway and required supplemental oxygen.  Pt hopeful for d/c home today.  MD aware of HHPT and RW needs.    Follow Up Recommendations  Home health PT     Equipment Recommendations  Rolling walker with 5" wheels    Recommendations for Other Services       Precautions / Restrictions Precautions Precautions: Fall Precaution Comments: monitor sats    Mobility  Bed Mobility               General bed mobility comments: pt in recliner with spouse in room    Transfers Overall transfer level: Needs assistance Equipment used: Rolling walker (2 wheeled) Transfers: Sit to/from Stand Sit to Stand: Min guard         General transfer comment: min/guard for safety, cues for hand placement  Ambulation/Gait Ambulation/Gait assistance: Min guard Gait Distance (Feet): 200 Feet Assistive device: Rolling walker (2 wheeled) Gait Pattern/deviations: Step-through pattern;Decreased stride length     General Gait Details: assist for RW in room with tight spaces however min/guard for ambulating in hallway, pt reports weakness and required supplemental oxygen (see other progress note), current SpO2 and liters required explained to pt and spouse   Marine scientist [place] (Stroke Patients Only)       Balance                                            Cognition Arousal/Alertness: Awake/alert Behavior During Therapy: WFL for tasks assessed/performed Overall Cognitive Status: Within Functional Limits for tasks assessed                                         Exercises      General Comments        Pertinent Vitals/Pain Pain Assessment: No/denies pain    Home Living                      Prior Function            PT Goals (current goals can now be found in the care plan section) Progress towards PT goals: Progressing toward goals    Frequency    Min 3X/week      PT Plan Discharge plan needs to be updated    Co-evaluation              AM-PAC PT "6 Clicks" Mobility   Outcome Measure  Help needed turning from your back to your side while in a flat bed without using bedrails?: None Help needed moving from lying on your back to sitting on the side of a flat bed without using bedrails?: None Help needed moving to and from a bed to a chair (including a wheelchair)?: A Little Help needed standing up from a chair using your arms (e.g., wheelchair or bedside chair)?: A Little Help needed to  walk in hospital room?: A Little Help needed climbing 3-5 steps with a railing? : A Little 6 Click Score: 20    End of Session Equipment Utilized During Treatment: Gait belt;Oxygen Activity Tolerance: Patient tolerated treatment well Patient left: with call bell/phone within reach;with family/visitor present;in chair Nurse Communication: Mobility status PT Visit Diagnosis: Difficulty in walking, not elsewhere classified (R26.2)     Time: [id number] PT Time Calculation (min) (ACUTE ONLY): 13 min  Charges:  $Gait Training: 8-22 mins                    Arlyce Dice, DPT Acute Rehabilitation Services Pager: [phone] Office: [phone] [place] [address] E 03/20/2021, 1:01 PM

## 2021-03-20 NOTE — Progress Notes (Signed)
Pt to be discharged to home this afternoon. Pt and Pt's Wife Opal Sidles given discharge teaching/instructions including all discharge Medications and schedules for these discahrge Medications. Pt and Wife verbalized understanding. Discharge AVS with Pt at time of discharge.

## 2021-03-20 NOTE — Discharge Summary (Signed)
Physician Discharge Summary  AUTHER CHAPLA [id number] DOB: 1946-06-01 DOA: 03/09/2021  PCP: Deland Pretty, MD  Admit date: 03/09/2021 Discharge date: 03/20/2021  Admitted From: home Discharge disposition: home   Recommendations for Outpatient Follow-Up:   BMP 1 week Sent home on 2L [facility]   Discharge Diagnosis:   Principal Problem:   Acute respiratory failure with hypoxia ([place]) Active Problems:   OSA (obstructive sleep apnea)   Essential hypertension   Hypercholesterolemia   Acute metabolic encephalopathy   CAP (community acquired pneumonia)   Acute renal failure superimposed on stage 3a chronic kidney disease ([place])   Hypotension    Discharge Condition: Improved.  Diet recommendation: Low sodium, heart healthy  Wound care: None.  Code status: Full.   History of Present Illness:   [name] is a 76 y.o. male with medical history significant of HTN, HLD, neuropathy, OSA. Presenting with dyspnea and confusion. 3 days ago, the patient's family noticed that he was fatigued and had a lack of appetite. No N/V noted. Over the next couple of says, he developed weakness with ambulating. Family checked a home COVID test. It was negative. Yesterday they checked his O2 sat and it was down to 89-90. He seemed disoriented to time and could not recover. They called for EMS. They deny any other aggravating or alleviating factors.    Hospital Course by Problem:   acute hypoxic respiratory failure secondary to probable CAP plus grade 2 diastolic dysfunction -Patient presented with confusion, generalized weakness, hypoxia, initially required 6 L high flow nasal cannula-- has been up to 15L HF -D-dimer noted to be elevated at 2.54. -CT angiogram chest negative for PE with small left pleural effusion and bibasilar atelectasis/scarring. -WBC within normal limits. -Blood cultures NGTD -Urine Legionella antigen negative/Urine pneumococcus antigen  negative. -Urine cultures with 10,000 colonies Enterococcus faecalis. -treated with abx -aggressive pulm toilet- encouraged to do more-pulm looked over charts and think time is needed and no recommendations for medication changes needed -BNP elevated, x ray shows worsening atelectasis/infiltrates-- last echo showed EF preserved but grade 2 diastolic dfxn-- IV lasix - down 4L  Hypotension -resolved, monitor  Acute metabolic encephalopathy -avoid morphine  Hyperlipidemia -Statin.  Hypertension -monitor with diuresis  chronic kidney disease stage IIIa -BMP 1 week  -monitor closely on lasix  OSA CPAP nightly.   Urinary retention -resolved   Hypokalemia -replete      Medical Consultants:   none   Discharge Exam:   Vitals:   03/19/21 2100 03/19/21 2236  BP: (!) 123/59   Pulse: 97 75  Resp: 16 18  Temp: 99.5 F (37.5 C)   SpO2: 94% 96%   Vitals:   03/19/21 0926 03/19/21 1140 03/19/21 2100 03/19/21 2236  BP: (!) 134/111 140/71 (!) 123/59   Pulse: (!) 106 93 97 75  Resp:  '18 16 18  '$ Temp:  98.7 F (37.1 C) 99.5 F (37.5 C)   TempSrc:   Tympanic   SpO2:  96% 94% 96%  Weight:      Height:        General exam: Appears calm and comfortable.   The results of significant diagnostics from this hospitalization (including imaging, microbiology, ancillary and laboratory) are listed below for reference.     Procedures and Diagnostic Studies:   CT HEAD WO CONTRAST (5MM)  Result Date: 03/09/2021 CLINICAL DATA:  Mental status change, unknown cause. Dizziness, weakness EXAM: CT HEAD WITHOUT CONTRAST TECHNIQUE: Contiguous axial images were  obtained from the base of the skull through the vertex without intravenous contrast. COMPARISON:  MRI 01/22/2015 FINDINGS: Brain: Normal anatomic configuration. Parenchymal volume loss is commensurate with the patient's age. Mild periventricular white matter changes are present likely reflecting the sequela of small vessel ischemia.  No abnormal intra or extra-axial mass lesion or fluid collection. No abnormal mass effect or midline shift. No evidence of acute intracranial hemorrhage or infarct. Ventricular size is normal. Cerebellum unremarkable. Vascular: No asymmetric hyperdense vasculature at the skull base. Skull: Intact Sinuses/Orbits: Paranasal sinuses are clear. Orbits are unremarkable. Other: Mastoid air cells and middle ear cavities are clear. IMPRESSION: No acute intracranial abnormality.  Mild senescent change. Electronically Signed   By: Fidela Salisbury MD   On: 03/09/2021 20:46   US RENAL  Result Date: 03/10/2021 CLINICAL DATA:  Acute renal injury EXAM: RENAL / URINARY TRACT ULTRASOUND COMPLETE COMPARISON:  02/07/2011 FINDINGS: Right Kidney: Renal measurements: 11.3 x 4.2 x 7.5 cm. = volume: 189 mL. Echogenicity within normal limits. No mass or hydronephrosis visualized. Left Kidney: Renal measurements: 12.4 x 5.5 x 6.8 cm. = volume: 240 mL. Echogenicity within normal limits. No mass or hydronephrosis visualized. Bladder: Decompressed Other: None. IMPRESSION: Normal appearing kidneys bilaterally. Previously seen right renal cyst is not well appreciated on this exam. Electronically Signed   By: Inez Catalina M.D.   On: 03/10/2021 19:16   DG Chest Portable 1 View  Result Date: 03/09/2021 CLINICAL DATA:  Altered level of consciousness, dizziness EXAM: PORTABLE CHEST 1 VIEW COMPARISON:  12/27/2015 FINDINGS: Single frontal view of the chest demonstrates an unremarkable cardiac silhouette. Chronic elevation of the left hemidiaphragm. Lung volumes are diminished, with bibasilar consolidation left greater than right. No effusion or pneumothorax. IMPRESSION: 1. Low lung volumes with bibasilar consolidation, favor atelectasis over airspace disease. Electronically Signed   By: Randa Ngo M.D.   On: 03/09/2021 20:44     Labs:   Basic Metabolic Panel: Recent Labs  Lab 03/16/21 0447 03/17/21 0508 03/18/21 0547 03/19/21 0503  03/20/21 0408  NA 135 135 135 136 135  K 3.5 3.5 3.2* 3.8 4.7  CL 104 102 100 99 100  CO2 '24 25 26 30 30  '$ GLUCOSE 122* 109* 107* 105* 101*  BUN '15 10 12 12 12  '$ CREATININE 0.98 0.90 0.95 1.07 1.16  CALCIUM 8.2* 8.2* 7.9* 8.1* 8.2*   GFR Estimated Creatinine Clearance: 57.7 mL/min (by C-G formula based on SCr of 1.16 mg/dL). Liver Function Tests: No results for input(s): AST, ALT, ALKPHOS, BILITOT, PROT, ALBUMIN in the last 168 hours. No results for input(s): LIPASE, AMYLASE in the last 168 hours. No results for input(s): AMMONIA in the last 168 hours. Coagulation profile No results for input(s): INR, PROTIME in the last 168 hours.  CBC: Recent Labs  Lab 03/14/21 0432 03/15/21 1137  WBC 6.2 10.5  HGB 9.6* 11.0*  HCT 28.6* 32.0*  MCV 93.2 91.2  PLT 136* 186   Cardiac Enzymes: No results for input(s): CKTOTAL, CKMB, CKMBINDEX, TROPONINI in the last 168 hours. BNP: Invalid input(s): POCBNP CBG: No results for input(s): GLUCAP in the last 168 hours. D-Dimer No results for input(s): DDIMER in the last 72 hours. Hgb A1c No results for input(s): HGBA1C in the last 72 hours. Lipid Profile No results for input(s): CHOL, HDL, LDLCALC, TRIG, CHOLHDL, LDLDIRECT in the last 72 hours. Thyroid function studies No results for input(s): TSH, T4TOTAL, T3FREE, THYROIDAB in the last 72 hours.  Invalid input(s): FREET3 Anemia work up No results  for input(s): VITAMINB12, FOLATE, FERRITIN, TIBC, IRON, RETICCTPCT in the last 72 hours. Microbiology Recent Results (from the past 240 hour(s))  Urine Culture     Status: None   Collection Time: 03/10/21  1:55 PM   Specimen: Urine, Clean Catch  Result Value Ref Range Status   Specimen Description   Final    URINE, CLEAN CATCH Performed at [facility], [place] [address] [place], [place] [address]    Special Requests   Final    NONE Performed at [facility], [place] [address] [place], [place] [address]     Culture   Final    NO GROWTH Performed at [facility], [place] [address], [place], [place] [address]    Report Status 03/12/2021 FINAL  Final  Culture, blood (Routine X 2) w Reflex to ID Panel     Status: None   Collection Time: 03/12/21 11:15 AM   Specimen: BLOOD  Result Value Ref Range Status   Specimen Description   Final    BLOOD BLOOD RIGHT HAND Performed at [place] [place] [address], [place], [place] [address]    Special Requests   Final    BOTTLES DRAWN AEROBIC ONLY Blood Culture results may not be optimal due to an inadequate volume of blood received in culture bottles Performed at [place] [address], [place], [place] [address]    Culture   Final    NO GROWTH 5 DAYS Performed at [facility], [place] [address], [place], [place] [address]    Report Status 03/17/2021 FINAL  Final  Culture, blood (Routine X 2) w Reflex to ID Panel     Status: None   Collection Time: 03/12/21 11:15 AM   Specimen: BLOOD  Result Value Ref Range Status   Specimen Description   Final    BLOOD RIGHT ANTECUBITAL Performed at [place] [address], [place], [place] [address]    Special Requests   Final    BOTTLES DRAWN AEROBIC ONLY Blood Culture results may not be optimal due to an inadequate volume of blood received in culture bottles Performed at [place] [address], [place], [place] [address]    Culture   Final    NO GROWTH 5 DAYS Performed at [facility], [place] [address] [place], [place] [address]    Report Status 03/17/2021 FINAL  Final  Urine Culture     Status: None   Collection Time: 03/12/21  1:08 PM   Specimen: Urine, Catheterized  Result Value Ref Range Status   Specimen Description   Final    URINE, CATHETERIZED Performed at [facility] [address] [place], [place] [address]    Special Requests   Final    NONE Performed at [facility], [place] [address] [place], [place] [address]    Culture   Final    NO GROWTH Performed at [facility], [place] [address], [place], [place] [address]    Report Status 03/13/2021 FINAL  Final  MRSA Next Gen by PCR, Nasal     Status: None   Collection Time: 03/13/21  9:08 AM   Specimen: Nasal Mucosa; Nasal Swab  Result Value Ref Range Status   MRSA by PCR Next Gen NOT DETECTED NOT DETECTED Final    Comment: (NOTE) The GeneXpert MRSA Assay (FDA approved for NASAL specimens only), is one component of a  comprehensive MRSA colonization surveillance program. It is not intended to diagnose MRSA infection nor to guide or monitor treatment for MRSA infections. Test performance is not FDA approved in patients less than 25 years old. Performed at [facility], [place] [address], [place], [place] [address]   Expectorated Sputum Assessment w Gram Stain, Rflx to Resp Cult     Status: None   Collection Time: 03/19/21  5:56 PM   Specimen: Expectorated Sputum  Result Value Ref Range Status   Specimen Description EXPECTORATED SPUTUM  Final   Special Requests NONE  Final   Sputum evaluation   Final    THIS SPECIMEN IS ACCEPTABLE FOR SPUTUM CULTURE Performed at [facility], [place] [address] [place], [place] [address]    Report Status 03/19/2021 FINAL  Final  Culture, Respiratory w Gram Stain     Status: None (Preliminary result)   Collection Time: 03/19/21  5:56 PM  Result Value Ref Range Status   Specimen Description   Final    EXPECTORATED SPUTUM Performed at [place] [address], [place], [place] [address]    Special Requests   Final    NONE Reflexed from [id number] Performed at [facility], [place] [address], [place], [place] [address]    Gram Stain   Final    FEW WBC PRESENT, PREDOMINANTLY PMN FEW GRAM POSITIVE COCCI IN PAIRS IN CLUSTERS RARE GRAM NEGATIVE RODS Performed at [facility], [place] [address] [place], [place] [address]    Culture PENDING  Incomplete   Report Status PENDING  Incomplete     Discharge Instructions:   Discharge Instructions     (HEART FAILURE PATIENTS) Call MD:  Anytime you have any of the following symptoms: 1) 3 pound weight gain in 24 hours or 5 pounds in 1 week 2) shortness of breath, with or without a dry hacking cough 3) swelling in the hands, feet or stomach 4) if you have to sleep on extra pillows at night in order to breathe.   Complete by: As directed    Call MD for:  persistant nausea and vomiting   Complete by: As directed    Call MD for:  temperature >100.4   Complete by: As directed    Diet - low sodium heart healthy   Complete by: As directed    Discharge instructions   Complete by: As directed    BMP 1 week Keep log of daily weights and bring to PCP Get a pulse oximeter to keep tract of pulse ox- goal >88% Home health   Increase activity slowly   Complete by: As directed       Allergies as of 03/20/2021       Reactions   Azithromycin    Other reaction(s): abdominal pain   Codeine     nausea   Doxycycline    Hives and itching   Penicillins    Swelling, rash   Sulfa Antibiotics         Medication List     STOP taking these medications    FIBER PO   labetalol 100 MG tablet Commonly known as: NORMODYNE   losartan 100 MG tablet Commonly known as: COZAAR   meloxicam 7.5 MG tablet Commonly known as: MOBIC       TAKE these medications    acetaminophen 325 MG tablet Commonly known as: TYLENOL Take 650 mg by mouth every 6 (six) hours as needed for mild pain, fever  or headache.   allopurinol 100 MG tablet Commonly known as: ZYLOPRIM Take 150 mg by mouth daily.   D3 2000 50 MCG (2000 UT) Caps Generic drug: Cholecalciferol Take 2,000 Units by mouth daily.   felodipine 10 MG 24 hr tablet Commonly known as: PLENDIL Take 10 mg by mouth daily.   Fiber Caps Take 1 capsule by mouth  daily.   furosemide 40 MG tablet Commonly known as: LASIX Take 1 tablet (40 mg total) by mouth daily. Start taking on: March 21, 2021   guaiFENesin-dextromethorphan 100-10 MG/5ML syrup Commonly known as: ROBITUSSIN DM Take 5 mLs by mouth every 4 (four) hours as needed for cough.   lamoTRIgine 150 MG tablet Commonly known as: LAMICTAL Take 2 tablets (300 mg total) by mouth 2 (two) times daily.   loratadine 10 MG tablet Commonly known as: CLARITIN Take 1 tablet (10 mg total) by mouth daily. Start taking on: March 21, 2021   minoxidil 10 MG tablet Commonly known as: LONITEN Take 40 mg by mouth daily.   multivitamin tablet Take 1 tablet by mouth daily.   ondansetron 4 MG tablet Commonly known as: ZOFRAN Take 1 tablet (4 mg total) by mouth every 6 (six) hours as needed for nausea.   pantoprazole 40 MG tablet Commonly known as: PROTONIX Take 1 tablet (40 mg total) by mouth daily at 6 (six) AM. Start taking on: March 21, 2021   potassium chloride SA 20 MEQ tablet Commonly known as: KLOR-CON Take 1 tablet (20 mEq total) by mouth daily.   prednisoLONE acetate 1 % ophthalmic suspension Commonly known as: PRED FORTE Place 1 drop into the left eye at bedtime.   primidone 50 MG tablet Commonly known as: MYSOLINE Take 1 tablet (50 mg total) by mouth in the morning and at bedtime.   simvastatin 20 MG tablet Commonly known as: ZOCOR Take 20 mg by mouth daily.   zolpidem 10 MG tablet Commonly known as: AMBIEN Take 5-10 mg by mouth at bedtime as needed for sleep.               Durable Medical Equipment  (From admission, onward)           Start     Ordered   03/20/21 1143  For home use only DME Walker rolling  Once       Question Answer Comment  Walker: With 5 Inch Wheels   Patient needs a walker to treat with the following condition Pneumonia      03/20/21 1142   03/20/21 0922  For home use only DME oxygen  Once       Question Answer Comment  Length of  Need 12 Months   Mode or (Route) Nasal cannula   Liters per Minute 3   Oxygen delivery system Gas      03/20/21 0921            Follow-up Information     Deland Pretty, MD Follow up in 1 week(s).   Specialty: Internal Medicine Contact information: [address] [place] [address] [phone]                  Time coordinating discharge: 35 min  Signed:  Geradine Girt DO  [facility] 03/20/2021, 12:08 PM

## 2021-03-21 ENCOUNTER — Telehealth: Payer: Self-pay | Admitting: Cardiology

## 2021-03-22 DIAGNOSIS — Z09 Encounter for follow-up examination after completed treatment for conditions other than malignant neoplasm: Secondary | ICD-10-CM | POA: Diagnosis not present

## 2021-03-22 DIAGNOSIS — I10 Essential (primary) hypertension: Secondary | ICD-10-CM | POA: Diagnosis not present

## 2021-03-22 DIAGNOSIS — R197 Diarrhea, unspecified: Secondary | ICD-10-CM | POA: Diagnosis not present

## 2021-03-22 LAB — CULTURE, RESPIRATORY W GRAM STAIN: Culture: NORMAL

## 2021-03-22 NOTE — Telephone Encounter (Signed)
Called and discussed with pt's wife. Pt recent admitted for acute hypoxic respiratory failure secondary to probable CAP plus G2DD. Pt treated with Abx and supportive therapy. Reviewed discharge instructions with pt and wife. Pt's wife was concerned about his losartan and labetalol being on hold due to concerns of hypotension and AKI while admitted, but never been restarted once discharged. BP since being home remains stable ~140/77.  Reports recovering since being home. Breathing currently stable on 2L [place]. Pt has a f/u OV with PCP later today to discuss TOC care. Will continue to remotely monitor BP readings and consider adding back labetalol and/or losartan pending follow up labwork and home BP readings. Pt to request getting labworks completed through PCP at the televisit today.

## 2021-03-23 DIAGNOSIS — R197 Diarrhea, unspecified: Secondary | ICD-10-CM | POA: Diagnosis not present

## 2021-03-27 ENCOUNTER — Telehealth: Payer: Self-pay | Admitting: Cardiology

## 2021-03-27 NOTE — Telephone Encounter (Signed)
Pt's wife called stating Pt was taken off atenolol '50mg'$ . Wants to know when he was taken off this medication. Pt has been having elevated heart rate since, & is recovering from double pneumonia. Please discuss w/wife at [phone].

## 2021-03-28 NOTE — Telephone Encounter (Signed)
Please call thank you

## 2021-03-28 NOTE — Telephone Encounter (Signed)
Called and discussed with pt's wife. Atenolol was switched to labetalol ~7/22 for elevated BP concerns. Labetalol was held during recent hospital admission due to episodes of hypotensive episodes. Since being home, home BP continues to remain stable since at 124/63 HR: 92. Pt denies any complains of palpitations, chest pain, worsening SOB. Pt has PCP f/u this Thursday. Pt to continue current therapy and follow up further with PCP.

## 2021-03-30 DIAGNOSIS — R0902 Hypoxemia: Secondary | ICD-10-CM | POA: Diagnosis not present

## 2021-03-30 DIAGNOSIS — I5033 Acute on chronic diastolic (congestive) heart failure: Secondary | ICD-10-CM | POA: Diagnosis not present

## 2021-03-30 DIAGNOSIS — A498 Other bacterial infections of unspecified site: Secondary | ICD-10-CM | POA: Diagnosis not present

## 2021-03-30 DIAGNOSIS — R197 Diarrhea, unspecified: Secondary | ICD-10-CM | POA: Diagnosis not present

## 2021-03-30 DIAGNOSIS — R5381 Other malaise: Secondary | ICD-10-CM | POA: Diagnosis not present

## 2021-03-31 DIAGNOSIS — Z947 Corneal transplant status: Secondary | ICD-10-CM | POA: Diagnosis not present

## 2021-03-31 DIAGNOSIS — H16201 Unspecified keratoconjunctivitis, right eye: Secondary | ICD-10-CM | POA: Diagnosis not present

## 2021-04-05 ENCOUNTER — Ambulatory Visit (INDEPENDENT_AMBULATORY_CARE_PROVIDER_SITE_OTHER): Payer: Medicare PPO | Admitting: Primary Care

## 2021-04-05 ENCOUNTER — Encounter: Payer: Self-pay | Admitting: Primary Care

## 2021-04-05 ENCOUNTER — Ambulatory Visit (INDEPENDENT_AMBULATORY_CARE_PROVIDER_SITE_OTHER): Payer: Medicare PPO

## 2021-04-05 ENCOUNTER — Other Ambulatory Visit: Payer: Self-pay

## 2021-04-05 VITALS — BP 124/70 | HR 88 | Temp 98.5°F | Ht 69.0 in | Wt 171.6 lb

## 2021-04-05 DIAGNOSIS — G4733 Obstructive sleep apnea (adult) (pediatric): Secondary | ICD-10-CM | POA: Diagnosis not present

## 2021-04-05 DIAGNOSIS — I5031 Acute diastolic (congestive) heart failure: Secondary | ICD-10-CM

## 2021-04-05 DIAGNOSIS — J189 Pneumonia, unspecified organism: Secondary | ICD-10-CM

## 2021-04-05 DIAGNOSIS — I509 Heart failure, unspecified: Secondary | ICD-10-CM | POA: Insufficient documentation

## 2021-04-05 DIAGNOSIS — J9601 Acute respiratory failure with hypoxia: Secondary | ICD-10-CM | POA: Diagnosis not present

## 2021-04-05 DIAGNOSIS — J9811 Atelectasis: Secondary | ICD-10-CM | POA: Diagnosis not present

## 2021-04-05 HISTORY — DX: Heart failure, unspecified: I50.9

## 2021-04-05 NOTE — Assessment & Plan Note (Signed)
-   Continue to wear CPAP 12cm h20

## 2021-04-05 NOTE — Assessment & Plan Note (Addendum)
-   He has been off supplemental oxygen for 2 days -04/05/2021 Walked on RA x 2 lap = approx 513f at slow pace, no desaturations. SpO2 remained 94% RA - Checking ONO on CPAP before discontinuing oxygen

## 2021-04-05 NOTE — Progress Notes (Signed)
$'@Patient'C$  ID: [name], male    DOB: 04/09/1946, 76 y.o.   MRN: [id number]  Chief Complaint  Patient presents with   Follow-up    Patient reports that he has not concerns, how long for lasix and potassium.     Referring provider: Deland Pretty, MD  HPI: 75 year old male, never smoked.  Past medical history significant for hypertension, systolic heart failure, obstructive sleep apnea, bronchiectasis, community-acquired pneumonia, elevated diaphragm, acute respiratory failure hypoxia, history of prostate cancer.  [name], last seen in office on 11/26/2018 for OSA follow-up.  04/05/2021 Patient presents today for hospital follow-up.  He was admitted on 03/09/2021-03/20/21 for acute hypoxic respiratory failure secondary to probable pneumonia plus grade 2 diastolic dysfunction.  Patient initially required 6 L high flow nasal cannula oxygen D-dimer noted to be elevated at 2.54.  Angiogram chest negative for litigate with small left pleural effusion and bibasilar atelectasis.  Negative date.  Urine Legionella antigen negative.  Treated with antibiotics along with aggressive pulmonary toileting.  He was elevated.  Echo cardiogram showed EF preserved with grade 2 diastolic dysfunction. He received IV Lasix and was diuresed 4 L.  His breathing has been fine since discharge. He has a np cough. He has been taking robitussin as needed. He is taking lasix '40mg'$  daily and potassium supplement. BMET with PCP was normal, creatinine 1.1. He has a follow-up visit with them scheduled for 04/07/21. Denies chest discomfort, chest tightness or wheezing.    Allergies  Allergen Reactions   Doxycycline Hives and Itching    Hives and itching   Penicillins Swelling    Swelling, rash   Azithromycin Other (See Comments)    Other reaction(s): abdominal pain   Codeine Other (See Comments)     nausea   Sulfa Antibiotics Rash    Immunization History  Administered Date(s) Administered   Fluad Quad(high  Dose 65+) 04/28/2019   Influenza Split 04/26/2011, 04/29/2012   Influenza Whole 05/09/2016   Influenza, High Dose Seasonal PF 05/27/2014, 05/05/2015, 05/09/2016   Influenza, Quadrivalent, Recombinant, Inj, Pf 05/20/2018, 05/02/2020   Influenza,inj,Quad PF,6+ Mos 05/05/2013   Influenza-Unspecified 06/02/2014, 04/06/2018   Moderna Sars-Covid-2 Vaccination 09/10/2019, 10/06/2019, 05/29/2020, 11/16/2020   Pneumococcal Conjugate-13 04/25/2009, 03/16/2014   Pneumococcal Polysaccharide-23 06/03/2012   Td 12/02/2003   Tdap 02/27/2013   Zoster Recombinat (Shingrix) 07/04/2020, 10/03/2020    Past Medical History:  Diagnosis Date   Cancer ([place])    Elevated diaphragm    Gout    History of prostate cancer Feb 1993   Hyperlipidemia    Hypertension    Hypoxemia    Normocytic anemia    OSA (obstructive sleep apnea)    Peripheral neuropathy    Pneumonia    Vision abnormalities     Tobacco History: Social History   Tobacco Use  Smoking Status Never  Smokeless Tobacco Never   Counseling given: Not Answered   Outpatient Medications Prior to Visit  Medication Sig Dispense Refill   acetaminophen (TYLENOL) 325 MG tablet Take 650 mg by mouth every 6 (six) hours as needed for mild pain, fever or headache.     allopurinol (ZYLOPRIM) 100 MG tablet Take 150 mg by mouth daily.     Cholecalciferol (D3 2000) 50 MCG (2000 UT) CAPS Take 2,000 Units by mouth daily.     felodipine (PLENDIL) 10 MG 24 hr tablet Take 10 mg by mouth daily.     fexofenadine (ALLEGRA) 60 MG tablet Take 60 mg by mouth as needed for  allergies or rhinitis.     Fiber CAPS Take 1 capsule by mouth daily.     furosemide (LASIX) 40 MG tablet Take 1 tablet (40 mg total) by mouth daily. 30 tablet 0   guaiFENesin-dextromethorphan (ROBITUSSIN DM) 100-10 MG/5ML syrup Take 5 mLs by mouth every 4 (four) hours as needed for cough. 118 mL 0   lamoTRIgine (LAMICTAL) 150 MG tablet Take 2 tablets (300 mg total) by mouth 2 (two) times daily.  120 tablet 5   minoxidil (LONITEN) 10 MG tablet Take 40 mg by mouth daily.     Multiple Vitamin (MULTIVITAMIN) tablet Take 1 tablet by mouth daily.     ondansetron (ZOFRAN) 4 MG tablet Take 1 tablet (4 mg total) by mouth every 6 (six) hours as needed for nausea. 20 tablet 0   pantoprazole (PROTONIX) 40 MG tablet Take 1 tablet (40 mg total) by mouth daily at 6 (six) AM. 60 tablet 0   potassium chloride SA (KLOR-CON) 20 MEQ tablet Take 1 tablet (20 mEq total) by mouth daily. 30 tablet 0   prednisoLONE acetate (PRED FORTE) 1 % ophthalmic suspension Place 1 drop into the left eye at bedtime.      primidone (MYSOLINE) 50 MG tablet Take 1 tablet (50 mg total) by mouth in the morning and at bedtime. 60 tablet 0   simvastatin (ZOCOR) 20 MG tablet Take 20 mg by mouth daily.     zolpidem (AMBIEN) 10 MG tablet Take 5-10 mg by mouth at bedtime as needed for sleep.     loratadine (CLARITIN) 10 MG tablet Take 1 tablet (10 mg total) by mouth daily. (Patient not taking: Reported on 04/05/2021)     No facility-administered medications prior to visit.      Review of Systems  Review of Systems  Constitutional: Negative.   HENT: Negative.    Respiratory:  Negative for cough, chest tightness, shortness of breath and wheezing.   Cardiovascular: Negative.  Negative for leg swelling.    Physical Exam  BP 124/70 (BP Location: Left Arm, Patient Position: Sitting, Cuff Size: Normal)   Pulse 88   Temp 98.5 F (36.9 C) (Oral)   Ht '5\' 9"'$  (1.753 m)   Wt 171 lb 9.6 oz (77.8 kg)   SpO2 96%   BMI 25.34 kg/m  Physical Exam Constitutional:      Appearance: Normal appearance.  HENT:     Head: Normocephalic and atraumatic.     Mouth/Throat:     Mouth: Mucous membranes are moist.     Pharynx: Oropharynx is clear.  Cardiovascular:     Rate and Rhythm: Normal rate and regular rhythm.     Comments: No edema Pulmonary:     Effort: Pulmonary effort is normal.     Breath sounds: No wheezing, rhonchi or rales.      Comments: Diminished left base otherwise clear  Musculoskeletal:        General: Normal range of motion.  Skin:    General: Skin is warm and dry.  Neurological:     General: No focal deficit present.     Mental Status: He is alert and oriented to person, place, and time. Mental status is at baseline.  Psychiatric:        Mood and Affect: Mood normal.        Behavior: Behavior normal.        Thought Content: Thought content normal.        Judgment: Judgment normal.     Lab Results:  CBC    Component Value Date/Time   WBC 10.5 03/15/2021 1137   RBC 3.51 (L) 03/15/2021 1137   HGB 11.0 (L) 03/15/2021 1137   HCT 32.0 (L) 03/15/2021 1137   PLT 186 03/15/2021 1137   MCV 91.2 03/15/2021 1137   MCH 31.3 03/15/2021 1137   MCHC 34.4 03/15/2021 1137   RDW 14.4 03/15/2021 1137   LYMPHSABS 0.6 (L) 03/13/2021 0413   MONOABS 0.5 03/13/2021 0413   EOSABS 0.2 03/13/2021 0413   BASOSABS 0.0 03/13/2021 0413    BMET    Component Value Date/Time   NA 135 03/20/2021 0408   NA 142 11/08/2020 1335   K 4.7 03/20/2021 0408   CL 100 03/20/2021 0408   CO2 30 03/20/2021 0408   GLUCOSE 101 (H) 03/20/2021 0408   BUN 12 03/20/2021 0408   BUN 22 11/08/2020 1335   CREATININE 1.16 03/20/2021 0408   CALCIUM 8.2 (L) 03/20/2021 0408   GFRNONAA >60 03/20/2021 0408   GFRAA 67 04/13/2020 1104    BNP    Component Value Date/Time   BNP 257.0 (H) 03/17/2021 0508    ProBNP    Component Value Date/Time   PROBNP 3524.0 (H) 02/07/2011 0545    Imaging: DG Chest 2 View  Result Date: 03/14/2021 CLINICAL DATA:  Cough EXAM: CHEST - 2 VIEW COMPARISON:  03/12/2021 FINDINGS: Low lung volumes. Persistent bibasilar opacities, left greater than right. Trace pleural effusions. Similar cardiomediastinal contours. IMPRESSION: Persistent bibasilar atelectasis/consolidation. Trace bilateral pleural effusions. Electronically Signed   By: Macy Mis M.D.   On: 03/14/2021 16:13   CT HEAD WO CONTRAST  (5MM)  Result Date: 03/09/2021 CLINICAL DATA:  Mental status change, unknown cause. Dizziness, weakness EXAM: CT HEAD WITHOUT CONTRAST TECHNIQUE: Contiguous axial images were obtained from the base of the skull through the vertex without intravenous contrast. COMPARISON:  MRI 01/22/2015 FINDINGS: Brain: Normal anatomic configuration. Parenchymal volume loss is commensurate with the patient's age. Mild periventricular white matter changes are present likely reflecting the sequela of small vessel ischemia. No abnormal intra or extra-axial mass lesion or fluid collection. No abnormal mass effect or midline shift. No evidence of acute intracranial hemorrhage or infarct. Ventricular size is normal. Cerebellum unremarkable. Vascular: No asymmetric hyperdense vasculature at the skull base. Skull: Intact Sinuses/Orbits: Paranasal sinuses are clear. Orbits are unremarkable. Other: Mastoid air cells and middle ear cavities are clear. IMPRESSION: No acute intracranial abnormality.  Mild senescent change. Electronically Signed   By: Fidela Salisbury MD   On: 03/09/2021 20:46   CT Angio Chest Pulmonary Embolism (PE) W or WO Contrast  Result Date: 03/11/2021 CLINICAL DATA:  Dyspnea, confusion, turn for pulse very embolism. EXAM: CT ANGIOGRAPHY CHEST WITH CONTRAST TECHNIQUE: Multidetector CT imaging of the chest was performed using the standard protocol during bolus administration of intravenous contrast. Multiplanar CT image reconstructions and MIPs were obtained to evaluate the vascular anatomy. CONTRAST:  66m OMNIPAQUE IOHEXOL 350 MG/ML SOLN COMPARISON:  Chest CT dated 10/16/2018 and chest radiograph dated 03/09/2021. FINDINGS: Cardiovascular: Satisfactory opacification of the pulmonary arteries to the segmental level. No evidence of pulmonary embolism. Vascular calcifications are seen in the coronary arteries and aortic arch. Normal heart size. No pericardial effusion. Mediastinum/Nodes: Prominent mediastinal lymph nodes  are not changed since at least 2016. No enlarged hilar or axillary lymph nodes. Thyroid gland, trachea, and esophagus demonstrate no significant findings. Lungs/Pleura: Moderate bibasilar atelectasis/scarring is increased since 10/16/2018. There is a small left pleural effusion. No pleural effusion on the right or pneumothorax  on either side. Upper Abdomen: A cyst in the liver measures 2.3 cm, unchanged. Musculoskeletal: Degenerative changes are seen in the spine. Review of the MIP images confirms the above findings. IMPRESSION: 1. No evidence of pulmonary embolism. 2. Small left pleural effusion and bibasilar atelectasis/scarring. Aortic Atherosclerosis (ICD10-I70.0). Electronically Signed   By: Zerita Boers M.D.   On: 03/11/2021 10:37   US RENAL  Result Date: 03/10/2021 CLINICAL DATA:  Acute renal injury EXAM: RENAL / URINARY TRACT ULTRASOUND COMPLETE COMPARISON:  02/07/2011 FINDINGS: Right Kidney: Renal measurements: 11.3 x 4.2 x 7.5 cm. = volume: 189 mL. Echogenicity within normal limits. No mass or hydronephrosis visualized. Left Kidney: Renal measurements: 12.4 x 5.5 x 6.8 cm. = volume: 240 mL. Echogenicity within normal limits. No mass or hydronephrosis visualized. Bladder: Decompressed Other: None. IMPRESSION: Normal appearing kidneys bilaterally. Previously seen right renal cyst is not well appreciated on this exam. Electronically Signed   By: Inez Catalina M.D.   On: 03/10/2021 19:16   DG CHEST PORT 1 VIEW  Result Date: 03/12/2021 CLINICAL DATA:  75 year old male with fatigue and shortness of breath. EXAM: PORTABLE CHEST - 1 VIEW COMPARISON:  03/11/2021 FINDINGS: The mediastinal contours are within normal limits. No cardiomegaly. Worsening bibasilar streaky opacities and blunting of the left costophrenic angle. Similar appearing elevation of the left hemidiaphragm. No acute osseous abnormality. IMPRESSION: Worsening bibasilar subsegmental atelectasis and trace left pleural effusion. Electronically  Signed   By: Ruthann Cancer MD   On: 03/12/2021 15:47   DG Chest Portable 1 View  Result Date: 03/09/2021 CLINICAL DATA:  Altered level of consciousness, dizziness EXAM: PORTABLE CHEST 1 VIEW COMPARISON:  12/27/2015 FINDINGS: Single frontal view of the chest demonstrates an unremarkable cardiac silhouette. Chronic elevation of the left hemidiaphragm. Lung volumes are diminished, with bibasilar consolidation left greater than right. No effusion or pneumothorax. IMPRESSION: 1. Low lung volumes with bibasilar consolidation, favor atelectasis over airspace disease. Electronically Signed   By: Randa Ngo M.D.   On: 03/09/2021 20:44     Assessment & Plan:   OSA (obstructive sleep apnea) - Continue to wear CPAP 12cm h20   CAP (community acquired pneumonia) -  Hospitalized in August 2022 for acute respiratory failure secondary to suspected CAP and heart failure, treated with abx and IV lasix. He has NP cough without chest tightness, wheezing or shortness of breath. Checking repeat CXR today.   Acute respiratory failure with hypoxia ([place]) - He has been off supplemental oxygen for 2 days -04/05/2021 Walked on RA x 2 lap = approx 553f at slow pace, no desaturations. SpO2 remained 94% RA - Checking ONO on CPAP before discontinuing oxygen  Heart failure ([facility]) - No evidence of fluid overload on exam today - Continue Lasix '40mg'$  daily until follow-up with Cardiology on Friday 04/07/21   EMartyn Ehrich NP 04/05/2021

## 2021-04-05 NOTE — Assessment & Plan Note (Signed)
-    Hospitalized in August 2022 for acute respiratory failure secondary to suspected CAP and heart failure, treated with abx and IV lasix. He has NP cough without chest tightness, wheezing or shortness of breath. Checking repeat CXR today.

## 2021-04-05 NOTE — Patient Instructions (Addendum)
Recommendations: - Continue lasix '40mg'$  daily and potassium supplement until follow-up with cardiology   OSA: - Continue CPAP   Orders: - Simple walk test in office today on room air  - Overnight oximetery on CPAP only (not oxygen) - CXR today   Follow-up: - Recall should already be in for April 2023 with Dr. Elsworth Soho

## 2021-04-05 NOTE — Assessment & Plan Note (Signed)
-   No evidence of fluid overload on exam today - Continue Lasix '40mg'$  daily until follow-up with Cardiology on Friday 04/07/21

## 2021-04-05 NOTE — Progress Notes (Signed)
Please let patient know CXR looked stable. No evidence of pneumonia. Decreased atelectasis, persistent elevated left hemidiaphragm

## 2021-04-06 DIAGNOSIS — G4730 Sleep apnea, unspecified: Secondary | ICD-10-CM | POA: Diagnosis not present

## 2021-04-06 DIAGNOSIS — R0683 Snoring: Secondary | ICD-10-CM | POA: Diagnosis not present

## 2021-04-06 NOTE — Progress Notes (Signed)
Follow up visit  Subjective:   [name], male    DOB: [birth year], 75 y.o.   MRN: [id number]   HPI   Chief Complaint  Patient presents with   Congestive Heart Failure   Follow-up   Hospitalization Follow-up    75 y.o. Caucasian male with hypertension, h/o cancer, peripheral neuropathy, HFpEF, pneumonia (8/20220  Patient was hospitalized in early 03/2021 ith complaints of dyspnea and confusion and temp up to 103F. He was found to be in acute hypoxic respiratory failure. He was treated for both probable CAP and HFpEF, was diuresed 11 L. BNP came down from 400 to 256. Infectious workup was only positive for E.faecalis in urine culture. He was discharged on home oxygen. After antiobiotics for pneumonia, he developed C.diff infection. He lost a lot of weight.Subsequently, he came off some antihypertensives.   Today, patient denies chest pain, shortness of breath, palpitations, leg edema, orthopnea, PND, TIA/syncope. Blood pressure is now elevated, although recent home readings are normal.   Current Outpatient Medications on File Prior to Visit  Medication Sig Dispense Refill   acetaminophen (TYLENOL) 325 MG tablet Take 650 mg by mouth every 6 (six) hours as needed for mild pain, fever or headache.     allopurinol (ZYLOPRIM) 100 MG tablet Take 150 mg by mouth daily.     Cholecalciferol (D3 2000) 50 MCG (2000 UT) CAPS Take 2,000 Units by mouth daily.     felodipine (PLENDIL) 10 MG 24 hr tablet Take 10 mg by mouth daily.     fexofenadine (ALLEGRA) 60 MG tablet Take 60 mg by mouth as needed for allergies or rhinitis.     Fiber CAPS Take 1 capsule by mouth daily.     furosemide (LASIX) 40 MG tablet Take 1 tablet (40 mg total) by mouth daily. 30 tablet 0   guaiFENesin-dextromethorphan (ROBITUSSIN DM) 100-10 MG/5ML syrup Take 5 mLs by mouth every 4 (four) hours as needed for cough. 118 mL 0   lamoTRIgine (LAMICTAL) 150 MG tablet Take 2 tablets (300 mg total) by mouth 2 (two) times  daily. 120 tablet 5   minoxidil (LONITEN) 10 MG tablet Take 40 mg by mouth daily.     Multiple Vitamin (MULTIVITAMIN) tablet Take 1 tablet by mouth daily.     ondansetron (ZOFRAN) 4 MG tablet Take 1 tablet (4 mg total) by mouth every 6 (six) hours as needed for nausea. 20 tablet 0   pantoprazole (PROTONIX) 40 MG tablet Take 1 tablet (40 mg total) by mouth daily at 6 (six) AM. 60 tablet 0   potassium chloride SA (KLOR-CON) 20 MEQ tablet Take 1 tablet (20 mEq total) by mouth daily. 30 tablet 0   prednisoLONE acetate (PRED FORTE) 1 % ophthalmic suspension Place 1 drop into the left eye at bedtime.      primidone (MYSOLINE) 50 MG tablet Take 1 tablet (50 mg total) by mouth in the morning and at bedtime. 60 tablet 0   simvastatin (ZOCOR) 20 MG tablet Take 20 mg by mouth daily.     zolpidem (AMBIEN) 10 MG tablet Take 5-10 mg by mouth at bedtime as needed for sleep.     No current facility-administered medications on file prior to visit.    Cardiovascular & other pertient studies:  EKG 04/07/2021: Sinus tachycardia 102 bpm  Nonspecific T-abnormality  CTA chest 03/11/2021: 1. No evidence of pulmonary embolism. 2. Small left pleural effusion and bibasilar atelectasis/scarring.   Aortic Atherosclerosis (ICD10-I70.0).    EKG 10/31/2020: Probable sinus  rhythm 57 bopm First degree A-V block  Nonspecific ST-T changes  Echocardiogram w/strain imaging 03/04/2020:  1. Normal GLS -20.9. Left ventricular ejection fraction, by estimation,  is 60 to 65%. The left ventricle has normal function. The left ventricle  has no regional wall motion abnormalities. There is mild left ventricular  hypertrophy. Left ventricular  diastolic parameters are consistent with Grade II diastolic dysfunction  (pseudonormalization). Elevated left ventricular end-diastolic pressure.  Normal GLS -20.9.  2. Right ventricular systolic function is normal. The right ventricular  size is normal.   3. Left atrial size was  moderately dilated.   4. The mitral valve is normal in structure. Mild mitral valve  regurgitation. No evidence of mitral stenosis.   5. The aortic valve is tricuspid. Aortic valve regurgitation is trivial.  Mild aortic valve sclerosis is present, with no evidence of aortic valve  stenosis.   6. The inferior vena cava is normal in size with greater than 50%  respiratory variability, suggesting right atrial pressure of 3 mmHg.   Echocardiogram 11/24/2019:  Normal LV systolic function with visual EF 55-60%. Left ventricle cavity is normal in size. Severe left ventricular hypertrophy. Normal global wall motion. Doppler evidence of grade III (restrictive) diastolic dysfunction, elevated LAP. Calculated EF 54%.  Left atrial cavity is severely dilated.  Grossly right atrial size is dilated.  Mild (Grade I) mitral regurgitation.  Mild tricuspid regurgitation. Mild pulmonary hypertension. RVSP measures 44 mmHg.  Mild pulmonic regurgitation.  IVC is dilated with a respiratory response of <50%.  No prior study for comparison.  Recent labs: 03/20/2021: Glucose 101, BUN/Cr 12/1.16. EGFR >60. Na/K 135/4.7.  H/H 11/32. MCV 91. Platelets 186 D-Dimer 2.54 high Results for LETRELL, ATTWOOD (MRN [id number]) as of 04/06/2021 21:22  Ref. Range 03/13/2021 12:13 03/16/2021 04:47 03/17/2021 05:08  B Natriuretic Peptide Latest Ref Range: 0.0 - 100.0 pg/mL 400.0 (H) 348.1 (H) 257.0 (H)   11/08/2020: Glucose 68, BUN/Cr 22/1.12. EGFR 68. Na/K 142/4.8.   06/24/2019: Glucose 89, BUN/Cr 16/1.1. EGFR 69. Na/K 145/4.8. Rest of the CMP normal H/H 13/40. MCV 90. Platelets 143 Chol 130, TG 94, HDL 44, LDL 67   Review of Systems  Constitutional: Positive for weight loss.  Cardiovascular:  Negative for chest pain, dyspnea on exertion, leg swelling, palpitations and syncope.       Vitals:   04/07/21 1046  BP: (!) 157/78  Pulse: 83  Temp: 97.9 F (36.6 C)  SpO2: 95%    Body mass index is 25.67 kg/m. Filed  Weights   04/07/21 1046  Weight: 173 lb 12.8 oz (78.8 kg)     Objective:   Physical Exam Vitals and nursing note reviewed.  Constitutional:      General: He is not in acute distress. Neck:     Vascular: No JVD.  Cardiovascular:     Rate and Rhythm: Normal rate and regular rhythm.     Heart sounds: Normal heart sounds. No murmur heard. Pulmonary:     Effort: Pulmonary effort is normal.     Breath sounds: Normal breath sounds. No wheezing or rales.  Musculoskeletal:     Right lower leg: No edema.     Left lower leg: No edema.       Assessment & Recommendations:   75 y.o. Caucasian male with hypertension, h/o cancer, peripheral neuropathy, HDpEF  HFpEF: Normal EF, grade III diastolic function at baseline with normal strain. Low suspicion for infiltrative cardiomyopathy. 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  While is euvolumic  and asymptomatic at baseline, I think he rapidly developed volume overload with IV fluids and antibiotics for pneumonia. Recommend switching minoxidil to losartan 50 mg. Stopped K supplement. In future, could consider adding spironolactone. Check echocardiogram.   Check BMP, BNP in 1 week.   Hypertension: Continue felodipine., Change minoxidil to spironolactone.   F/u in 4 weeks  [place], MD [facility] Pager: [phone] Office: [phone]

## 2021-04-07 ENCOUNTER — Ambulatory Visit: Payer: Medicare PPO | Admitting: Cardiology

## 2021-04-07 ENCOUNTER — Other Ambulatory Visit: Payer: Self-pay

## 2021-04-07 ENCOUNTER — Encounter: Payer: Self-pay | Admitting: Cardiology

## 2021-04-07 ENCOUNTER — Telehealth: Payer: Self-pay | Admitting: Pulmonary Disease

## 2021-04-07 VITALS — BP 157/78 | HR 83 | Temp 97.9°F | Ht 69.0 in | Wt 173.8 lb

## 2021-04-07 DIAGNOSIS — G4733 Obstructive sleep apnea (adult) (pediatric): Secondary | ICD-10-CM

## 2021-04-07 DIAGNOSIS — I5032 Chronic diastolic (congestive) heart failure: Secondary | ICD-10-CM | POA: Diagnosis not present

## 2021-04-07 DIAGNOSIS — I10 Essential (primary) hypertension: Secondary | ICD-10-CM | POA: Diagnosis not present

## 2021-04-07 MED ORDER — FUROSEMIDE 40 MG PO TABS: 40.0000 mg | ORAL_TABLET | Freq: Every day | ORAL | 3 refills | Status: DC

## 2021-04-07 MED ORDER — LOSARTAN POTASSIUM 50 MG PO TABS: 50.0000 mg | ORAL_TABLET | Freq: Every day | ORAL | 3 refills | Status: DC

## 2021-04-07 NOTE — Telephone Encounter (Signed)
Please advise if ok to place new order for a machine?

## 2021-04-07 NOTE — Telephone Encounter (Signed)
Called and spoke with pt's spouse letting her know that we were going to place order to have pt's cpap machine replaced and she verbalized understanding. Order has been placed. Nothing further needed.

## 2021-04-07 NOTE — Telephone Encounter (Signed)
Yes that is fine as long as they have been seen in the last year, keep current settings

## 2021-04-09 ENCOUNTER — Other Ambulatory Visit: Payer: Self-pay | Admitting: Neurology

## 2021-04-09 DIAGNOSIS — I130 Hypertensive heart and chronic kidney disease with heart failure and stage 1 through stage 4 chronic kidney disease, or unspecified chronic kidney disease: Secondary | ICD-10-CM | POA: Diagnosis not present

## 2021-04-09 DIAGNOSIS — N179 Acute kidney failure, unspecified: Secondary | ICD-10-CM | POA: Diagnosis not present

## 2021-04-09 DIAGNOSIS — R911 Solitary pulmonary nodule: Secondary | ICD-10-CM | POA: Diagnosis not present

## 2021-04-09 DIAGNOSIS — A0472 Enterocolitis due to Clostridium difficile, not specified as recurrent: Secondary | ICD-10-CM | POA: Diagnosis not present

## 2021-04-09 DIAGNOSIS — J189 Pneumonia, unspecified organism: Secondary | ICD-10-CM | POA: Diagnosis not present

## 2021-04-09 DIAGNOSIS — N1831 Chronic kidney disease, stage 3a: Secondary | ICD-10-CM | POA: Diagnosis not present

## 2021-04-09 DIAGNOSIS — I5033 Acute on chronic diastolic (congestive) heart failure: Secondary | ICD-10-CM | POA: Diagnosis not present

## 2021-04-09 DIAGNOSIS — G4733 Obstructive sleep apnea (adult) (pediatric): Secondary | ICD-10-CM | POA: Diagnosis not present

## 2021-04-09 DIAGNOSIS — J9601 Acute respiratory failure with hypoxia: Secondary | ICD-10-CM | POA: Diagnosis not present

## 2021-04-10 DIAGNOSIS — I10 Essential (primary) hypertension: Secondary | ICD-10-CM | POA: Diagnosis not present

## 2021-04-11 ENCOUNTER — Encounter: Payer: Self-pay | Admitting: *Deleted

## 2021-04-11 DIAGNOSIS — A0472 Enterocolitis due to Clostridium difficile, not specified as recurrent: Secondary | ICD-10-CM | POA: Diagnosis not present

## 2021-04-12 DIAGNOSIS — A0472 Enterocolitis due to Clostridium difficile, not specified as recurrent: Secondary | ICD-10-CM | POA: Diagnosis not present

## 2021-04-12 DIAGNOSIS — R911 Solitary pulmonary nodule: Secondary | ICD-10-CM | POA: Diagnosis not present

## 2021-04-12 DIAGNOSIS — N1831 Chronic kidney disease, stage 3a: Secondary | ICD-10-CM | POA: Diagnosis not present

## 2021-04-12 DIAGNOSIS — G4733 Obstructive sleep apnea (adult) (pediatric): Secondary | ICD-10-CM | POA: Diagnosis not present

## 2021-04-12 DIAGNOSIS — N179 Acute kidney failure, unspecified: Secondary | ICD-10-CM | POA: Diagnosis not present

## 2021-04-12 DIAGNOSIS — I130 Hypertensive heart and chronic kidney disease with heart failure and stage 1 through stage 4 chronic kidney disease, or unspecified chronic kidney disease: Secondary | ICD-10-CM | POA: Diagnosis not present

## 2021-04-12 DIAGNOSIS — I5033 Acute on chronic diastolic (congestive) heart failure: Secondary | ICD-10-CM | POA: Diagnosis not present

## 2021-04-12 DIAGNOSIS — J189 Pneumonia, unspecified organism: Secondary | ICD-10-CM | POA: Diagnosis not present

## 2021-04-12 DIAGNOSIS — J9601 Acute respiratory failure with hypoxia: Secondary | ICD-10-CM | POA: Diagnosis not present

## 2021-04-13 DIAGNOSIS — R1030 Lower abdominal pain, unspecified: Secondary | ICD-10-CM | POA: Diagnosis not present

## 2021-04-13 DIAGNOSIS — A0472 Enterocolitis due to Clostridium difficile, not specified as recurrent: Secondary | ICD-10-CM | POA: Diagnosis not present

## 2021-04-14 ENCOUNTER — Telehealth: Payer: Self-pay | Admitting: Primary Care

## 2021-04-14 DIAGNOSIS — I5032 Chronic diastolic (congestive) heart failure: Secondary | ICD-10-CM | POA: Diagnosis not present

## 2021-04-14 DIAGNOSIS — I10 Essential (primary) hypertension: Secondary | ICD-10-CM | POA: Diagnosis not present

## 2021-04-14 NOTE — Telephone Encounter (Signed)
Not in my in-box, can we call and get results faxed to me. Thanks

## 2021-04-14 NOTE — Telephone Encounter (Signed)
EW  --[name] is calling to get the results of the ONO for this pt.  Please advise. Thanks

## 2021-04-15 LAB — BRAIN NATRIURETIC PEPTIDE: BNP: 55.9 pg/mL (ref 0.0–100.0)

## 2021-04-15 LAB — BASIC METABOLIC PANEL
BUN/Creatinine Ratio: 18 (ref 10–24)
BUN: 19 mg/dL (ref 8–27)
CO2: 28 mmol/L (ref 20–29)
Calcium: 9.8 mg/dL (ref 8.6–10.2)
Chloride: 97 mmol/L (ref 96–106)
Creatinine, Ser: 1.08 mg/dL (ref 0.76–1.27)
Glucose: 104 mg/dL — ABNORMAL HIGH (ref 65–99)
Potassium: 4.6 mmol/L (ref 3.5–5.2)
Sodium: 141 mmol/L (ref 134–144)
eGFR: 71 mL/min/{1.73_m2} (ref >59)

## 2021-04-17 DIAGNOSIS — Z8669 Personal history of other diseases of the nervous system and sense organs: Secondary | ICD-10-CM | POA: Diagnosis not present

## 2021-04-17 DIAGNOSIS — H43811 Vitreous degeneration, right eye: Secondary | ICD-10-CM | POA: Diagnosis not present

## 2021-04-17 NOTE — Telephone Encounter (Signed)
Called [employer] and requested them to have the ONO faxed to Beth's attention. Will change the fax machine later today for the fax.

## 2021-04-18 DIAGNOSIS — J9601 Acute respiratory failure with hypoxia: Secondary | ICD-10-CM | POA: Diagnosis not present

## 2021-04-18 DIAGNOSIS — N179 Acute kidney failure, unspecified: Secondary | ICD-10-CM | POA: Diagnosis not present

## 2021-04-18 DIAGNOSIS — N1831 Chronic kidney disease, stage 3a: Secondary | ICD-10-CM | POA: Diagnosis not present

## 2021-04-18 DIAGNOSIS — A0472 Enterocolitis due to Clostridium difficile, not specified as recurrent: Secondary | ICD-10-CM | POA: Diagnosis not present

## 2021-04-18 DIAGNOSIS — R911 Solitary pulmonary nodule: Secondary | ICD-10-CM | POA: Diagnosis not present

## 2021-04-18 DIAGNOSIS — I130 Hypertensive heart and chronic kidney disease with heart failure and stage 1 through stage 4 chronic kidney disease, or unspecified chronic kidney disease: Secondary | ICD-10-CM | POA: Diagnosis not present

## 2021-04-18 DIAGNOSIS — J189 Pneumonia, unspecified organism: Secondary | ICD-10-CM | POA: Diagnosis not present

## 2021-04-18 DIAGNOSIS — I5033 Acute on chronic diastolic (congestive) heart failure: Secondary | ICD-10-CM | POA: Diagnosis not present

## 2021-04-18 DIAGNOSIS — G4733 Obstructive sleep apnea (adult) (pediatric): Secondary | ICD-10-CM | POA: Diagnosis not present

## 2021-04-18 NOTE — Telephone Encounter (Signed)
EW please advise. Thanks   Hello. I'd like to know if you've received a final result for the overnight oximetry test I had, which was completed September 2.  Thank you, [name]

## 2021-04-19 ENCOUNTER — Other Ambulatory Visit: Payer: Self-pay

## 2021-04-19 ENCOUNTER — Ambulatory Visit: Payer: Medicare PPO

## 2021-04-19 DIAGNOSIS — I5032 Chronic diastolic (congestive) heart failure: Secondary | ICD-10-CM | POA: Diagnosis not present

## 2021-04-19 DIAGNOSIS — I10 Essential (primary) hypertension: Secondary | ICD-10-CM

## 2021-04-20 DIAGNOSIS — N179 Acute kidney failure, unspecified: Secondary | ICD-10-CM | POA: Diagnosis not present

## 2021-04-20 DIAGNOSIS — G4733 Obstructive sleep apnea (adult) (pediatric): Secondary | ICD-10-CM | POA: Diagnosis not present

## 2021-04-20 DIAGNOSIS — I5033 Acute on chronic diastolic (congestive) heart failure: Secondary | ICD-10-CM | POA: Diagnosis not present

## 2021-04-20 DIAGNOSIS — N1831 Chronic kidney disease, stage 3a: Secondary | ICD-10-CM | POA: Diagnosis not present

## 2021-04-20 DIAGNOSIS — I130 Hypertensive heart and chronic kidney disease with heart failure and stage 1 through stage 4 chronic kidney disease, or unspecified chronic kidney disease: Secondary | ICD-10-CM | POA: Diagnosis not present

## 2021-04-20 DIAGNOSIS — R911 Solitary pulmonary nodule: Secondary | ICD-10-CM | POA: Diagnosis not present

## 2021-04-20 DIAGNOSIS — J189 Pneumonia, unspecified organism: Secondary | ICD-10-CM | POA: Diagnosis not present

## 2021-04-20 DIAGNOSIS — A0472 Enterocolitis due to Clostridium difficile, not specified as recurrent: Secondary | ICD-10-CM | POA: Diagnosis not present

## 2021-04-20 DIAGNOSIS — J9601 Acute respiratory failure with hypoxia: Secondary | ICD-10-CM | POA: Diagnosis not present

## 2021-04-21 DIAGNOSIS — R911 Solitary pulmonary nodule: Secondary | ICD-10-CM | POA: Diagnosis not present

## 2021-04-21 DIAGNOSIS — J9601 Acute respiratory failure with hypoxia: Secondary | ICD-10-CM | POA: Diagnosis not present

## 2021-04-21 DIAGNOSIS — A0472 Enterocolitis due to Clostridium difficile, not specified as recurrent: Secondary | ICD-10-CM | POA: Diagnosis not present

## 2021-04-21 DIAGNOSIS — I5033 Acute on chronic diastolic (congestive) heart failure: Secondary | ICD-10-CM | POA: Diagnosis not present

## 2021-04-21 DIAGNOSIS — N1831 Chronic kidney disease, stage 3a: Secondary | ICD-10-CM | POA: Diagnosis not present

## 2021-04-21 DIAGNOSIS — N179 Acute kidney failure, unspecified: Secondary | ICD-10-CM | POA: Diagnosis not present

## 2021-04-21 DIAGNOSIS — I130 Hypertensive heart and chronic kidney disease with heart failure and stage 1 through stage 4 chronic kidney disease, or unspecified chronic kidney disease: Secondary | ICD-10-CM | POA: Diagnosis not present

## 2021-04-21 DIAGNOSIS — J189 Pneumonia, unspecified organism: Secondary | ICD-10-CM | POA: Diagnosis not present

## 2021-04-21 DIAGNOSIS — G4733 Obstructive sleep apnea (adult) (pediatric): Secondary | ICD-10-CM | POA: Diagnosis not present

## 2021-04-21 NOTE — Telephone Encounter (Signed)
Can we request ONO and let patient know we will be in touch once we receive

## 2021-04-21 NOTE — Telephone Encounter (Signed)
ONO has not been located in office.   Community message sent to [employer] to fax results.

## 2021-04-21 NOTE — Telephone Encounter (Signed)
Can someone look in my look at folder to see if this ONO is in there? If so, how long did he spend with SPO2 <88%.

## 2021-04-21 NOTE — Telephone Encounter (Signed)
Call made to patient, confirmed DOB. Made aware we still do not have ONO results but [employer] has been contacted. Voiced understanding.

## 2021-04-21 NOTE — Telephone Encounter (Signed)
Check both of your mail boxes and there was not a ONO for [name] in either one.

## 2021-04-22 DIAGNOSIS — J188 Other pneumonia, unspecified organism: Secondary | ICD-10-CM | POA: Diagnosis not present

## 2021-04-22 DIAGNOSIS — J9690 Respiratory failure, unspecified, unspecified whether with hypoxia or hypercapnia: Secondary | ICD-10-CM | POA: Diagnosis not present

## 2021-04-22 DIAGNOSIS — G4733 Obstructive sleep apnea (adult) (pediatric): Secondary | ICD-10-CM | POA: Diagnosis not present

## 2021-04-22 DIAGNOSIS — J471 Bronchiectasis with (acute) exacerbation: Secondary | ICD-10-CM | POA: Diagnosis not present

## 2021-04-24 ENCOUNTER — Telehealth: Payer: Self-pay | Admitting: Pulmonary Disease

## 2021-04-24 NOTE — Telephone Encounter (Signed)
ONO on CPAP/ RA showed desat <88 x 3.5 h Can you confirm that he does not have oxygen blended into CPAP anymore? To qualify for O2, he may need another titration study

## 2021-04-25 ENCOUNTER — Telehealth: Payer: Self-pay | Admitting: Pulmonary Disease

## 2021-04-25 DIAGNOSIS — G4733 Obstructive sleep apnea (adult) (pediatric): Secondary | ICD-10-CM | POA: Diagnosis not present

## 2021-04-25 DIAGNOSIS — N179 Acute kidney failure, unspecified: Secondary | ICD-10-CM | POA: Diagnosis not present

## 2021-04-25 DIAGNOSIS — J189 Pneumonia, unspecified organism: Secondary | ICD-10-CM | POA: Diagnosis not present

## 2021-04-25 DIAGNOSIS — I5033 Acute on chronic diastolic (congestive) heart failure: Secondary | ICD-10-CM | POA: Diagnosis not present

## 2021-04-25 DIAGNOSIS — J9601 Acute respiratory failure with hypoxia: Secondary | ICD-10-CM | POA: Diagnosis not present

## 2021-04-25 DIAGNOSIS — N1831 Chronic kidney disease, stage 3a: Secondary | ICD-10-CM | POA: Diagnosis not present

## 2021-04-25 DIAGNOSIS — R911 Solitary pulmonary nodule: Secondary | ICD-10-CM | POA: Diagnosis not present

## 2021-04-25 DIAGNOSIS — I130 Hypertensive heart and chronic kidney disease with heart failure and stage 1 through stage 4 chronic kidney disease, or unspecified chronic kidney disease: Secondary | ICD-10-CM | POA: Diagnosis not present

## 2021-04-25 DIAGNOSIS — A0472 Enterocolitis due to Clostridium difficile, not specified as recurrent: Secondary | ICD-10-CM | POA: Diagnosis not present

## 2021-04-25 NOTE — Telephone Encounter (Signed)
Spoke with the pt's spouse  She confirmed that the night of the ONO he was on CPAP only, no o2 blended in  Since the night of the test he has resumed using CPAP with 2lpm o2 blended in   Pt okay with doing cpap titration study  Did you want to go ahead and order?

## 2021-04-25 NOTE — Telephone Encounter (Signed)
I have called the pts wife and she is aware of appt scheduled for mychart visit with EW on 09/27 at 12.  Nothing further is needed.

## 2021-04-25 NOTE — Telephone Encounter (Signed)
This was not address during August visit, wife called afterwards requesting new CPAP machine. He will need virtual visit to review CPAP usage. Please get download prior to that to review.

## 2021-04-25 NOTE — Telephone Encounter (Signed)
Spoke with pts wife and she is aware of RA recs.  Nothing further is needed.

## 2021-04-25 NOTE — Telephone Encounter (Signed)
Please see msg below regarding CPAP order and advise.  Lulu Riding; Edson Snowball, Melissa Hello [place],   We received the order for new pap unit. However, we are in need of the following to process this order:   1. We need to have the face to face notes from 04-05-2021 addended to mention usage of current machine and benefits he has been receiving.     Once this is received we will be able to continue to process this order.   Thank you,   Demetrius Charity

## 2021-04-25 NOTE — Telephone Encounter (Signed)
EW please advise. Thanks

## 2021-04-26 DIAGNOSIS — G4733 Obstructive sleep apnea (adult) (pediatric): Secondary | ICD-10-CM | POA: Diagnosis not present

## 2021-04-26 DIAGNOSIS — R911 Solitary pulmonary nodule: Secondary | ICD-10-CM | POA: Diagnosis not present

## 2021-04-26 DIAGNOSIS — J9601 Acute respiratory failure with hypoxia: Secondary | ICD-10-CM | POA: Diagnosis not present

## 2021-04-26 DIAGNOSIS — N1831 Chronic kidney disease, stage 3a: Secondary | ICD-10-CM | POA: Diagnosis not present

## 2021-04-26 DIAGNOSIS — A0472 Enterocolitis due to Clostridium difficile, not specified as recurrent: Secondary | ICD-10-CM | POA: Diagnosis not present

## 2021-04-26 DIAGNOSIS — I5033 Acute on chronic diastolic (congestive) heart failure: Secondary | ICD-10-CM | POA: Diagnosis not present

## 2021-04-26 DIAGNOSIS — I130 Hypertensive heart and chronic kidney disease with heart failure and stage 1 through stage 4 chronic kidney disease, or unspecified chronic kidney disease: Secondary | ICD-10-CM | POA: Diagnosis not present

## 2021-04-26 DIAGNOSIS — N179 Acute kidney failure, unspecified: Secondary | ICD-10-CM | POA: Diagnosis not present

## 2021-04-26 DIAGNOSIS — J189 Pneumonia, unspecified organism: Secondary | ICD-10-CM | POA: Diagnosis not present

## 2021-04-26 NOTE — Telephone Encounter (Signed)
Glenn Noel, MD  Rosana Berger, CMA Caller: Unspecified (Yesterday,  3:12 PM) Since he already has oxygen, okay to continue does not require titration study

## 2021-04-27 DIAGNOSIS — R911 Solitary pulmonary nodule: Secondary | ICD-10-CM | POA: Diagnosis not present

## 2021-04-27 DIAGNOSIS — N179 Acute kidney failure, unspecified: Secondary | ICD-10-CM | POA: Diagnosis not present

## 2021-04-27 DIAGNOSIS — A0472 Enterocolitis due to Clostridium difficile, not specified as recurrent: Secondary | ICD-10-CM | POA: Diagnosis not present

## 2021-04-27 DIAGNOSIS — I5033 Acute on chronic diastolic (congestive) heart failure: Secondary | ICD-10-CM | POA: Diagnosis not present

## 2021-04-27 DIAGNOSIS — I130 Hypertensive heart and chronic kidney disease with heart failure and stage 1 through stage 4 chronic kidney disease, or unspecified chronic kidney disease: Secondary | ICD-10-CM | POA: Diagnosis not present

## 2021-04-27 DIAGNOSIS — J189 Pneumonia, unspecified organism: Secondary | ICD-10-CM | POA: Diagnosis not present

## 2021-04-27 DIAGNOSIS — J9601 Acute respiratory failure with hypoxia: Secondary | ICD-10-CM | POA: Diagnosis not present

## 2021-04-27 DIAGNOSIS — N1831 Chronic kidney disease, stage 3a: Secondary | ICD-10-CM | POA: Diagnosis not present

## 2021-04-27 DIAGNOSIS — G4733 Obstructive sleep apnea (adult) (pediatric): Secondary | ICD-10-CM | POA: Diagnosis not present

## 2021-04-27 NOTE — Telephone Encounter (Signed)
Will forward to triage to look for ONO.

## 2021-04-27 NOTE — Telephone Encounter (Signed)
ONO already reviewed- see phone note dated 04/24/21

## 2021-04-28 DIAGNOSIS — J189 Pneumonia, unspecified organism: Secondary | ICD-10-CM | POA: Diagnosis not present

## 2021-04-28 DIAGNOSIS — N179 Acute kidney failure, unspecified: Secondary | ICD-10-CM | POA: Diagnosis not present

## 2021-04-28 DIAGNOSIS — I130 Hypertensive heart and chronic kidney disease with heart failure and stage 1 through stage 4 chronic kidney disease, or unspecified chronic kidney disease: Secondary | ICD-10-CM | POA: Diagnosis not present

## 2021-04-28 DIAGNOSIS — N1831 Chronic kidney disease, stage 3a: Secondary | ICD-10-CM | POA: Diagnosis not present

## 2021-04-28 DIAGNOSIS — J9601 Acute respiratory failure with hypoxia: Secondary | ICD-10-CM | POA: Diagnosis not present

## 2021-04-28 DIAGNOSIS — R911 Solitary pulmonary nodule: Secondary | ICD-10-CM | POA: Diagnosis not present

## 2021-04-28 DIAGNOSIS — G4733 Obstructive sleep apnea (adult) (pediatric): Secondary | ICD-10-CM | POA: Diagnosis not present

## 2021-04-28 DIAGNOSIS — A0472 Enterocolitis due to Clostridium difficile, not specified as recurrent: Secondary | ICD-10-CM | POA: Diagnosis not present

## 2021-04-28 DIAGNOSIS — I5033 Acute on chronic diastolic (congestive) heart failure: Secondary | ICD-10-CM | POA: Diagnosis not present

## 2021-05-01 DIAGNOSIS — A0472 Enterocolitis due to Clostridium difficile, not specified as recurrent: Secondary | ICD-10-CM | POA: Diagnosis not present

## 2021-05-01 DIAGNOSIS — J189 Pneumonia, unspecified organism: Secondary | ICD-10-CM | POA: Diagnosis not present

## 2021-05-01 DIAGNOSIS — J9601 Acute respiratory failure with hypoxia: Secondary | ICD-10-CM | POA: Diagnosis not present

## 2021-05-01 DIAGNOSIS — N179 Acute kidney failure, unspecified: Secondary | ICD-10-CM | POA: Diagnosis not present

## 2021-05-01 DIAGNOSIS — I130 Hypertensive heart and chronic kidney disease with heart failure and stage 1 through stage 4 chronic kidney disease, or unspecified chronic kidney disease: Secondary | ICD-10-CM | POA: Diagnosis not present

## 2021-05-01 DIAGNOSIS — I5033 Acute on chronic diastolic (congestive) heart failure: Secondary | ICD-10-CM | POA: Diagnosis not present

## 2021-05-01 DIAGNOSIS — R911 Solitary pulmonary nodule: Secondary | ICD-10-CM | POA: Diagnosis not present

## 2021-05-01 DIAGNOSIS — N1831 Chronic kidney disease, stage 3a: Secondary | ICD-10-CM | POA: Diagnosis not present

## 2021-05-01 DIAGNOSIS — G4733 Obstructive sleep apnea (adult) (pediatric): Secondary | ICD-10-CM | POA: Diagnosis not present

## 2021-05-02 ENCOUNTER — Telehealth (INDEPENDENT_AMBULATORY_CARE_PROVIDER_SITE_OTHER): Payer: Medicare PPO | Admitting: Primary Care

## 2021-05-02 ENCOUNTER — Encounter: Payer: Self-pay | Admitting: Primary Care

## 2021-05-02 DIAGNOSIS — J9601 Acute respiratory failure with hypoxia: Secondary | ICD-10-CM | POA: Diagnosis not present

## 2021-05-02 DIAGNOSIS — N179 Acute kidney failure, unspecified: Secondary | ICD-10-CM | POA: Diagnosis not present

## 2021-05-02 DIAGNOSIS — G4733 Obstructive sleep apnea (adult) (pediatric): Secondary | ICD-10-CM | POA: Diagnosis not present

## 2021-05-02 DIAGNOSIS — I5033 Acute on chronic diastolic (congestive) heart failure: Secondary | ICD-10-CM | POA: Diagnosis not present

## 2021-05-02 DIAGNOSIS — A0472 Enterocolitis due to Clostridium difficile, not specified as recurrent: Secondary | ICD-10-CM | POA: Diagnosis not present

## 2021-05-02 DIAGNOSIS — R911 Solitary pulmonary nodule: Secondary | ICD-10-CM | POA: Diagnosis not present

## 2021-05-02 DIAGNOSIS — I130 Hypertensive heart and chronic kidney disease with heart failure and stage 1 through stage 4 chronic kidney disease, or unspecified chronic kidney disease: Secondary | ICD-10-CM | POA: Diagnosis not present

## 2021-05-02 DIAGNOSIS — J189 Pneumonia, unspecified organism: Secondary | ICD-10-CM | POA: Diagnosis not present

## 2021-05-02 DIAGNOSIS — N1831 Chronic kidney disease, stage 3a: Secondary | ICD-10-CM | POA: Diagnosis not present

## 2021-05-02 NOTE — Patient Instructions (Signed)
Recommendations: Please bring SD card to our office for updated CPAP compliance download   Orders:  Repeat ONO December on CPAP only  New CPAP machine at 12cm h20    Follow-up: Please ensure recall in for April 2023 with Dr. Elsworth Soho

## 2021-05-02 NOTE — Progress Notes (Signed)
Virtual Visit via Video Note  I connected with [name] on 05/02/21 at 12:00 PM EDT by a video enabled telemedicine application and verified that I am speaking with the correct person using two identifiers.  Location: Patient: Home Provider: Office   I discussed the limitations of evaluation and management by telemedicine and the availability of in person appointments. The patient expressed understanding and agreed to proceed.  History of Present Illness: 75 year old male, never smoked.  Past medical history significant for hypertension, systolic heart failure, obstructive sleep apnea, bronchiectasis, community-acquired pneumonia, elevated diaphragm, acute respiratory failure hypoxia, history of prostate cancer.  [name].   Previous [facility] pulmonary encounter: 04/05/2021 Patient presents today for hospital follow-up.  He was admitted on 03/09/2021-03/20/21 for acute hypoxic respiratory failure secondary to probable pneumonia plus grade 2 diastolic dysfunction.  Patient initially required 6 L high flow nasal cannula oxygen D-dimer noted to be elevated at 2.54.  Angiogram chest negative for litigate with small left pleural effusion and bibasilar atelectasis.  Negative date.  Urine Legionella antigen negative.  Treated with antibiotics along with aggressive pulmonary toileting.  He was elevated.  Echo cardiogram showed EF preserved with grade 2 diastolic dysfunction. He received IV Lasix and was diuresed 4 L.  His breathing has been fine since discharge. He has a np cough. He has been taking robitussin as needed. He is taking lasix 40mg  daily and potassium supplement. BMET with PCP was normal, creatinine 1.1. He has a follow-up visit with them scheduled for 04/07/21. Denies chest discomfort, chest tightness or wheezing.    05/02/2021- Interim hx  Patient contacted today for virtual visit, attempted to log on to video but patient was unable. Switched to telephone visit.   Patient's wife  called needing order for new CPAP. His current machine is >5 year. He reports compliance with CPAP and benefit from use. Pressure set at 12cm h20. In April 2022 it was documented by Dr. Elsworth Soho that he had excellent compliance more than 6 hours every night with good control of events but large airleaks. Mask was changed to airfit. This has been working well for him. No issues with mask. Sleeping well at night.  He will need to bring SD card for compliance check. He is wearing 2L oxygen at night with CPAP but is reluctant to continue use. Oxygen is new from discharge in August for acute hypoxic respiratory failure secondary to probably pneumonia plus grade 2 diastolic dysfunction.  He no longer is requiring oxygen during the day time. ONO 04/06/21 showed patient spent 3 hours 38 mins with Spo2 <88% on CPAP. He already has oxygen, CPAP titration study not required. Continue to use 2L oxygen blended into CPAP.   Machine is > 82 years old.  Resmed  Serial [id number] Lot number [id number]    Observations/Objective:  - Able to speak in full sentences; No overt shortness of breath, wheezing or cough    Significant tests/ events   PSG 02/2002 - mild OSA with AHI 16/h, lowest satn of 70% >> CPAP 9   10/30/2011 SPlit study showed O2 still required -esp during REM sleep  Increased CpAP to 12 cm , Keep 3 L O2 blended in    07/2016 PFTs nml  Airview download 08/15/20-11/21/20 Usage 99/99 days (100%); 99% > 4 HOURS  Average usage 6 hours 22 mins Pressure 12 cm h20 Airlleaks 43.3L/min AHI 2.6   Assessment and Plan:  OSA: - PSG in 2003 showed mild-moderate OSA, AHI 16/hr. Compliance reports  from January-April 2022 showed excellent compliance with CPAP and he reports improvement in daytime somnolence and fatigue. He will bring SD card in for updated compliance check. We have placed a DME order for new CPAP machine.  Chronic respiratory failure: - He required oxygen back in 2013 on split night sleep study. ONO  04/06/21 showed patient spent 3 hours 38 mins with Spo2 <88% on CPAP. He already has oxygen, CPAP titration study not required. Continue to use 2L oxygen blended into CPAP. He is reluctant to continue oxygen long term, repeat ONO in 3 months p treatment for pneumonia to assess for ongoing need   Follow Up Instructions:   - April 2023 with Dr. Elsworth Soho OSA on CPAP  I discussed the assessment and treatment plan with the patient. The patient was provided an opportunity to ask questions and all were answered. The patient agreed with the plan and demonstrated an understanding of the instructions.   The patient was advised to call back or seek an in-person evaluation if the symptoms worsen or if the condition fails to improve as anticipated.  I provided 28 minutes of non-face-to-face time during this encounter.   Glenn Ehrich, NP

## 2021-05-03 DIAGNOSIS — I5033 Acute on chronic diastolic (congestive) heart failure: Secondary | ICD-10-CM | POA: Diagnosis not present

## 2021-05-03 DIAGNOSIS — J189 Pneumonia, unspecified organism: Secondary | ICD-10-CM | POA: Diagnosis not present

## 2021-05-03 DIAGNOSIS — A0472 Enterocolitis due to Clostridium difficile, not specified as recurrent: Secondary | ICD-10-CM | POA: Diagnosis not present

## 2021-05-03 DIAGNOSIS — G4733 Obstructive sleep apnea (adult) (pediatric): Secondary | ICD-10-CM | POA: Diagnosis not present

## 2021-05-03 DIAGNOSIS — J9601 Acute respiratory failure with hypoxia: Secondary | ICD-10-CM | POA: Diagnosis not present

## 2021-05-03 DIAGNOSIS — N179 Acute kidney failure, unspecified: Secondary | ICD-10-CM | POA: Diagnosis not present

## 2021-05-03 DIAGNOSIS — R911 Solitary pulmonary nodule: Secondary | ICD-10-CM | POA: Diagnosis not present

## 2021-05-03 DIAGNOSIS — N1831 Chronic kidney disease, stage 3a: Secondary | ICD-10-CM | POA: Diagnosis not present

## 2021-05-03 DIAGNOSIS — I130 Hypertensive heart and chronic kidney disease with heart failure and stage 1 through stage 4 chronic kidney disease, or unspecified chronic kidney disease: Secondary | ICD-10-CM | POA: Diagnosis not present

## 2021-05-04 ENCOUNTER — Other Ambulatory Visit: Payer: Self-pay

## 2021-05-04 ENCOUNTER — Ambulatory Visit: Payer: Medicare PPO | Admitting: Cardiology

## 2021-05-04 ENCOUNTER — Telehealth: Payer: Self-pay | Admitting: Primary Care

## 2021-05-04 ENCOUNTER — Encounter: Payer: Self-pay | Admitting: Cardiology

## 2021-05-04 VITALS — BP 142/87 | HR 85 | Resp 16 | Ht 69.0 in | Wt 173.4 lb

## 2021-05-04 DIAGNOSIS — I10 Essential (primary) hypertension: Secondary | ICD-10-CM | POA: Diagnosis not present

## 2021-05-04 DIAGNOSIS — I5032 Chronic diastolic (congestive) heart failure: Secondary | ICD-10-CM | POA: Diagnosis not present

## 2021-05-04 MED ORDER — LOSARTAN POTASSIUM 50 MG PO TABS: 50.0000 mg | ORAL_TABLET | Freq: Every day | ORAL | 3 refills | Status: DC

## 2021-05-04 NOTE — Telephone Encounter (Signed)
Patient brought SD card into the office for [place] to get a download. Patient states that it needs to go to [facility] for review. Got download and placed it in Beth's review fold. Gave card back to patient and advised him Eustaquio Maize would review it and we would let him know if she had any recommendations or if everything looked good. Patient expressed understanding. Will route to [facility]

## 2021-05-04 NOTE — Progress Notes (Signed)
Follow up visit  Subjective:   [name], male    DOB: April 11, 1946, 75 y.o.   MRN: [id number]   HPI   Chief Complaint  Patient presents with    Essential hypertension   Follow-up    75 y.o. Caucasian male with hypertension, h/o cancer, peripheral neuropathy, HFpEF, pneumonia (8/20220  Patient was hospitalized in early 03/2021 ith complaints of dyspnea and confusion and temp up to 103F. He was found to be in acute hypoxic respiratory failure. He was treated for both probable CAP and HFpEF, was diuresed 11 L. BNP came down from 400 to 256. Infectious workup was only positive for E.faecalis in urine culture. He was discharged on home oxygen. After antiobiotics for pneumonia, he developed C.diff infection. He lost a lot of weight.Subsequently, he came off some antihypertensives. In 04/2021, I switched minoxidil to losartan.  Patient is here for follow up. He is doing well. He denies chest pain, shortness of breath, palpitations, leg edema, orthopnea, PND, TIA/syncope. Blood pressure is fairly well controlled. Reviewed recent echocardiogram and lab results with the patient.    Current Outpatient Medications on File Prior to Visit  Medication Sig Dispense Refill   acetaminophen (TYLENOL) 325 MG tablet Take 650 mg by mouth every 6 (six) hours as needed for mild pain, fever or headache.     allopurinol (ZYLOPRIM) 100 MG tablet Take 150 mg by mouth daily.     Cholecalciferol (D3 2000) 50 MCG (2000 UT) CAPS Take 2,000 Units by mouth daily.     felodipine (PLENDIL) 10 MG 24 hr tablet Take 10 mg by mouth daily.     fexofenadine (ALLEGRA) 60 MG tablet Take 60 mg by mouth as needed for allergies or rhinitis.     Fiber CAPS Take 1 capsule by mouth daily.     furosemide (LASIX) 40 MG tablet Take 1 tablet (40 mg total) by mouth daily. 30 tablet 3   guaiFENesin-dextromethorphan (ROBITUSSIN DM) 100-10 MG/5ML syrup Take 5 mLs by mouth every 4 (four) hours as needed for cough. 118 mL 0    lamoTRIgine (LAMICTAL) 150 MG tablet TAKE TWO TABLETS BY MOUTH TWICE A DAY 360 tablet 1   losartan (COZAAR) 50 MG tablet Take 1 tablet (50 mg total) by mouth daily. 30 tablet 3   Multiple Vitamin (MULTIVITAMIN) tablet Take 1 tablet by mouth daily.     ondansetron (ZOFRAN) 4 MG tablet Take 1 tablet (4 mg total) by mouth every 6 (six) hours as needed for nausea. 20 tablet 0   pantoprazole (PROTONIX) 40 MG tablet Take 1 tablet (40 mg total) by mouth daily at 6 (six) AM. 60 tablet 0   prednisoLONE acetate (PRED FORTE) 1 % ophthalmic suspension Place 1 drop into the left eye at bedtime.      primidone (MYSOLINE) 50 MG tablet Take 1 tablet (50 mg total) by mouth in the morning and at bedtime. 60 tablet 0   simvastatin (ZOCOR) 20 MG tablet Take 20 mg by mouth daily.     zolpidem (AMBIEN) 10 MG tablet Take 5-10 mg by mouth at bedtime as needed for sleep.     No current facility-administered medications on file prior to visit.    Cardiovascular & other pertient studies:  Echocardiogram 04/19/2021:  Study Quality: Technically difficult study.  Normal LV systolic function with visual EF 50-55%. Left ventricle cavity  is normal in size. Severe left ventricular hypertrophy. Normal global wall  motion. Doppler evidence of grade II (pseudonormal) diastolic dysfunction,  indeterminate  LAP.  Left atrial cavity is mildly dilated.  Trace aortic regurgitation.  Mild (Grade I) mitral regurgitation.  No evidence of pulmonary hypertension.  Compared to study 03/04/2020: LVEF was 60-65% now 50-55%, LAE was moderate  now mild, otherwise no significant change.   EKG 04/07/2021: Sinus tachycardia 102 bpm  Nonspecific T-abnormality  CTA chest 03/11/2021: 1. No evidence of pulmonary embolism. 2. Small left pleural effusion and bibasilar atelectasis/scarring.   Aortic Atherosclerosis (ICD10-I70.0).    EKG 10/31/2020: Probable sinus rhythm 57 bopm First degree A-V block  Nonspecific ST-T  changes  Echocardiogram w/strain imaging 03/04/2020:  1. Normal GLS -20.9. Left ventricular ejection fraction, by estimation,  is 60 to 65%. The left ventricle has normal function. The left ventricle  has no regional wall motion abnormalities. There is mild left ventricular  hypertrophy. Left ventricular  diastolic parameters are consistent with Grade II diastolic dysfunction  (pseudonormalization). Elevated left ventricular end-diastolic pressure.  Normal GLS -20.9.  2. Right ventricular systolic function is normal. The right ventricular  size is normal.   3. Left atrial size was moderately dilated.   4. The mitral valve is normal in structure. Mild mitral valve  regurgitation. No evidence of mitral stenosis.   5. The aortic valve is tricuspid. Aortic valve regurgitation is trivial.  Mild aortic valve sclerosis is present, with no evidence of aortic valve  stenosis.   6. The inferior vena cava is normal in size with greater than 50%  respiratory variability, suggesting right atrial pressure of 3 mmHg.    Recent labs: 04/14/2021: Glucose 104, BUN/Cr 19/1.08. EGFR 71. Na/K 141/4.6.  BNP 55  03/20/2021: Glucose 101, BUN/Cr 12/1.16. EGFR >60. Na/K 135/4.7.  H/H 11/32. MCV 91. Platelets 186 D-Dimer 2.54 high Results for [name], [name] (MRN [id number]) as of 04/06/2021 21:22  Ref. Range 03/13/2021 12:13 03/16/2021 04:47 03/17/2021 05:08  B Natriuretic Peptide Latest Ref Range: 0.0 - 100.0 pg/mL 400.0 (H) 348.1 (H) 257.0 (H)   11/08/2020: Glucose 68, BUN/Cr 22/1.12. EGFR 68. Na/K 142/4.8.   06/24/2019: Glucose 89, BUN/Cr 16/1.1. EGFR 69. Na/K 145/4.8. Rest of the CMP normal H/H 13/40. MCV 90. Platelets 143 Chol 130, TG 94, HDL 44, LDL 67   Review of Systems  Cardiovascular:  Negative for chest pain, dyspnea on exertion, leg swelling, palpitations and syncope.  Musculoskeletal:  Positive for back pain.       Vitals:   05/04/21 1400  BP: (!) 142/87  Pulse: 85  Resp: 16  SpO2: 100%     Body mass index is 25.61 kg/m. Filed Weights   05/04/21 1400  Weight: 173 lb 6.4 oz (78.7 kg)     Objective:   Physical Exam Vitals and nursing note reviewed.  Constitutional:      General: He is not in acute distress. Neck:     Vascular: No JVD.  Cardiovascular:     Rate and Rhythm: Normal rate and regular rhythm.     Heart sounds: Normal heart sounds. No murmur heard. Pulmonary:     Effort: Pulmonary effort is normal.     Breath sounds: Normal breath sounds. No wheezing or rales.  Musculoskeletal:     Right lower leg: No edema.     Left lower leg: No edema.       Assessment & Recommendations:   75 y.o. Caucasian male with hypertension, h/o cancer, peripheral neuropathy, HDpEF  HFpEF: Normal EF, grade III diastolic function at baseline with normal strain. Low suspicion for infiltrative cardiomyopathy. 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  While is euvolumic and asymptomatic at baseline, I think he rapidly developed volume overload with IV fluids and antibiotics for pneumonia during his hospitalization min 03/2021. Back to baseline now. BNP normal. Continue losartan 50 mg.   Hypertension: Continue felodipine., Change minoxidil to spironolactone.   F/u in 4 weeks  Helen, MD [facility] Pager: [phone] Office: [phone]

## 2021-05-05 NOTE — Telephone Encounter (Signed)
Download showed excellent compliance with good control of his events on current pressure setting. Please make sure patient had an order in for new cpap machine.   Airview download 02/02/21-05/02/21 89/90 days used; 98% > 4 hours Average usage 7 hours 34 mins Pressure 12cm h20 AHI 1.8

## 2021-05-07 ENCOUNTER — Other Ambulatory Visit: Payer: Self-pay | Admitting: Neurology

## 2021-05-08 DIAGNOSIS — Z23 Encounter for immunization: Secondary | ICD-10-CM | POA: Diagnosis not present

## 2021-05-08 DIAGNOSIS — A0472 Enterocolitis due to Clostridium difficile, not specified as recurrent: Secondary | ICD-10-CM | POA: Diagnosis not present

## 2021-05-08 DIAGNOSIS — I5032 Chronic diastolic (congestive) heart failure: Secondary | ICD-10-CM | POA: Diagnosis not present

## 2021-05-08 NOTE — Telephone Encounter (Signed)
I have called the pt and he is aware of results of the DL per [facility].  He is aware that the order has been sent in for the new cpap machine.  Nothing further is needed

## 2021-05-09 DIAGNOSIS — G4733 Obstructive sleep apnea (adult) (pediatric): Secondary | ICD-10-CM | POA: Diagnosis not present

## 2021-05-09 DIAGNOSIS — R911 Solitary pulmonary nodule: Secondary | ICD-10-CM | POA: Diagnosis not present

## 2021-05-09 DIAGNOSIS — J9601 Acute respiratory failure with hypoxia: Secondary | ICD-10-CM | POA: Diagnosis not present

## 2021-05-09 DIAGNOSIS — I130 Hypertensive heart and chronic kidney disease with heart failure and stage 1 through stage 4 chronic kidney disease, or unspecified chronic kidney disease: Secondary | ICD-10-CM | POA: Diagnosis not present

## 2021-05-09 DIAGNOSIS — A0472 Enterocolitis due to Clostridium difficile, not specified as recurrent: Secondary | ICD-10-CM | POA: Diagnosis not present

## 2021-05-09 DIAGNOSIS — J189 Pneumonia, unspecified organism: Secondary | ICD-10-CM | POA: Diagnosis not present

## 2021-05-09 DIAGNOSIS — N179 Acute kidney failure, unspecified: Secondary | ICD-10-CM | POA: Diagnosis not present

## 2021-05-09 DIAGNOSIS — I5033 Acute on chronic diastolic (congestive) heart failure: Secondary | ICD-10-CM | POA: Diagnosis not present

## 2021-05-09 DIAGNOSIS — N1831 Chronic kidney disease, stage 3a: Secondary | ICD-10-CM | POA: Diagnosis not present

## 2021-05-10 ENCOUNTER — Telehealth: Payer: Self-pay | Admitting: Neurology

## 2021-05-10 DIAGNOSIS — I10 Essential (primary) hypertension: Secondary | ICD-10-CM | POA: Diagnosis not present

## 2021-05-10 DIAGNOSIS — A0472 Enterocolitis due to Clostridium difficile, not specified as recurrent: Secondary | ICD-10-CM | POA: Diagnosis not present

## 2021-05-10 MED ORDER — PRIMIDONE 50 MG PO TABS: ORAL_TABLET | ORAL | 1 refills | Status: DC

## 2021-05-10 NOTE — Telephone Encounter (Signed)
E-scribed rx to pharmacy per pt request.

## 2021-05-10 NOTE — Telephone Encounter (Signed)
Pt called requesting refill for primidone (MYSOLINE) 50 MG tablet. Pt asking for a 3 month supply. Pharmacy [facility] [phone].

## 2021-05-11 DIAGNOSIS — N1831 Chronic kidney disease, stage 3a: Secondary | ICD-10-CM | POA: Diagnosis not present

## 2021-05-11 DIAGNOSIS — N179 Acute kidney failure, unspecified: Secondary | ICD-10-CM | POA: Diagnosis not present

## 2021-05-11 DIAGNOSIS — I5033 Acute on chronic diastolic (congestive) heart failure: Secondary | ICD-10-CM | POA: Diagnosis not present

## 2021-05-11 DIAGNOSIS — A0472 Enterocolitis due to Clostridium difficile, not specified as recurrent: Secondary | ICD-10-CM | POA: Diagnosis not present

## 2021-05-11 DIAGNOSIS — J9601 Acute respiratory failure with hypoxia: Secondary | ICD-10-CM | POA: Diagnosis not present

## 2021-05-11 DIAGNOSIS — G4733 Obstructive sleep apnea (adult) (pediatric): Secondary | ICD-10-CM | POA: Diagnosis not present

## 2021-05-11 DIAGNOSIS — R911 Solitary pulmonary nodule: Secondary | ICD-10-CM | POA: Diagnosis not present

## 2021-05-11 DIAGNOSIS — J189 Pneumonia, unspecified organism: Secondary | ICD-10-CM | POA: Diagnosis not present

## 2021-05-11 DIAGNOSIS — I130 Hypertensive heart and chronic kidney disease with heart failure and stage 1 through stage 4 chronic kidney disease, or unspecified chronic kidney disease: Secondary | ICD-10-CM | POA: Diagnosis not present

## 2021-05-12 DIAGNOSIS — J9690 Respiratory failure, unspecified, unspecified whether with hypoxia or hypercapnia: Secondary | ICD-10-CM | POA: Diagnosis not present

## 2021-05-12 DIAGNOSIS — G4733 Obstructive sleep apnea (adult) (pediatric): Secondary | ICD-10-CM | POA: Diagnosis not present

## 2021-05-12 DIAGNOSIS — H209 Unspecified iridocyclitis: Secondary | ICD-10-CM | POA: Diagnosis not present

## 2021-05-12 DIAGNOSIS — Z9889 Other specified postprocedural states: Secondary | ICD-10-CM | POA: Diagnosis not present

## 2021-05-12 DIAGNOSIS — J471 Bronchiectasis with (acute) exacerbation: Secondary | ICD-10-CM | POA: Diagnosis not present

## 2021-05-12 DIAGNOSIS — J188 Other pneumonia, unspecified organism: Secondary | ICD-10-CM | POA: Diagnosis not present

## 2021-05-15 ENCOUNTER — Telehealth: Payer: Self-pay | Admitting: Pulmonary Disease

## 2021-05-15 DIAGNOSIS — M19011 Primary osteoarthritis, right shoulder: Secondary | ICD-10-CM | POA: Diagnosis not present

## 2021-05-15 DIAGNOSIS — M7541 Impingement syndrome of right shoulder: Secondary | ICD-10-CM | POA: Diagnosis not present

## 2021-05-15 NOTE — Telephone Encounter (Signed)
I called the patient to set up a follow up for CPAP compliance for dates: 06/12/2021 through 08/10/2021. Patient wants to call back another day to make a follow up for CPAP compliance.

## 2021-05-17 DIAGNOSIS — A0472 Enterocolitis due to Clostridium difficile, not specified as recurrent: Secondary | ICD-10-CM | POA: Diagnosis not present

## 2021-05-17 DIAGNOSIS — N1831 Chronic kidney disease, stage 3a: Secondary | ICD-10-CM | POA: Diagnosis not present

## 2021-05-17 DIAGNOSIS — G4733 Obstructive sleep apnea (adult) (pediatric): Secondary | ICD-10-CM | POA: Diagnosis not present

## 2021-05-17 DIAGNOSIS — J189 Pneumonia, unspecified organism: Secondary | ICD-10-CM | POA: Diagnosis not present

## 2021-05-17 DIAGNOSIS — N179 Acute kidney failure, unspecified: Secondary | ICD-10-CM | POA: Diagnosis not present

## 2021-05-17 DIAGNOSIS — J9601 Acute respiratory failure with hypoxia: Secondary | ICD-10-CM | POA: Diagnosis not present

## 2021-05-17 DIAGNOSIS — R911 Solitary pulmonary nodule: Secondary | ICD-10-CM | POA: Diagnosis not present

## 2021-05-17 DIAGNOSIS — I130 Hypertensive heart and chronic kidney disease with heart failure and stage 1 through stage 4 chronic kidney disease, or unspecified chronic kidney disease: Secondary | ICD-10-CM | POA: Diagnosis not present

## 2021-05-17 DIAGNOSIS — I5033 Acute on chronic diastolic (congestive) heart failure: Secondary | ICD-10-CM | POA: Diagnosis not present

## 2021-05-22 DIAGNOSIS — J471 Bronchiectasis with (acute) exacerbation: Secondary | ICD-10-CM | POA: Diagnosis not present

## 2021-05-22 DIAGNOSIS — J188 Other pneumonia, unspecified organism: Secondary | ICD-10-CM | POA: Diagnosis not present

## 2021-05-22 DIAGNOSIS — J9690 Respiratory failure, unspecified, unspecified whether with hypoxia or hypercapnia: Secondary | ICD-10-CM | POA: Diagnosis not present

## 2021-05-22 DIAGNOSIS — H43811 Vitreous degeneration, right eye: Secondary | ICD-10-CM | POA: Diagnosis not present

## 2021-05-22 DIAGNOSIS — Z8669 Personal history of other diseases of the nervous system and sense organs: Secondary | ICD-10-CM | POA: Diagnosis not present

## 2021-05-22 DIAGNOSIS — H209 Unspecified iridocyclitis: Secondary | ICD-10-CM | POA: Diagnosis not present

## 2021-05-22 DIAGNOSIS — G4733 Obstructive sleep apnea (adult) (pediatric): Secondary | ICD-10-CM | POA: Diagnosis not present

## 2021-05-24 DIAGNOSIS — N1831 Chronic kidney disease, stage 3a: Secondary | ICD-10-CM | POA: Diagnosis not present

## 2021-05-24 DIAGNOSIS — G4733 Obstructive sleep apnea (adult) (pediatric): Secondary | ICD-10-CM | POA: Diagnosis not present

## 2021-05-24 DIAGNOSIS — R911 Solitary pulmonary nodule: Secondary | ICD-10-CM | POA: Diagnosis not present

## 2021-05-24 DIAGNOSIS — J9601 Acute respiratory failure with hypoxia: Secondary | ICD-10-CM | POA: Diagnosis not present

## 2021-05-24 DIAGNOSIS — A0472 Enterocolitis due to Clostridium difficile, not specified as recurrent: Secondary | ICD-10-CM | POA: Diagnosis not present

## 2021-05-24 DIAGNOSIS — I130 Hypertensive heart and chronic kidney disease with heart failure and stage 1 through stage 4 chronic kidney disease, or unspecified chronic kidney disease: Secondary | ICD-10-CM | POA: Diagnosis not present

## 2021-05-24 DIAGNOSIS — J189 Pneumonia, unspecified organism: Secondary | ICD-10-CM | POA: Diagnosis not present

## 2021-05-24 DIAGNOSIS — N179 Acute kidney failure, unspecified: Secondary | ICD-10-CM | POA: Diagnosis not present

## 2021-05-24 DIAGNOSIS — I5033 Acute on chronic diastolic (congestive) heart failure: Secondary | ICD-10-CM | POA: Diagnosis not present

## 2021-05-30 DIAGNOSIS — J9601 Acute respiratory failure with hypoxia: Secondary | ICD-10-CM | POA: Diagnosis not present

## 2021-05-30 DIAGNOSIS — R911 Solitary pulmonary nodule: Secondary | ICD-10-CM | POA: Diagnosis not present

## 2021-05-30 DIAGNOSIS — B078 Other viral warts: Secondary | ICD-10-CM | POA: Diagnosis not present

## 2021-05-30 DIAGNOSIS — J189 Pneumonia, unspecified organism: Secondary | ICD-10-CM | POA: Diagnosis not present

## 2021-05-30 DIAGNOSIS — N1831 Chronic kidney disease, stage 3a: Secondary | ICD-10-CM | POA: Diagnosis not present

## 2021-05-30 DIAGNOSIS — N179 Acute kidney failure, unspecified: Secondary | ICD-10-CM | POA: Diagnosis not present

## 2021-05-30 DIAGNOSIS — I130 Hypertensive heart and chronic kidney disease with heart failure and stage 1 through stage 4 chronic kidney disease, or unspecified chronic kidney disease: Secondary | ICD-10-CM | POA: Diagnosis not present

## 2021-05-30 DIAGNOSIS — L570 Actinic keratosis: Secondary | ICD-10-CM | POA: Diagnosis not present

## 2021-05-30 DIAGNOSIS — G4733 Obstructive sleep apnea (adult) (pediatric): Secondary | ICD-10-CM | POA: Diagnosis not present

## 2021-05-30 DIAGNOSIS — C44629 Squamous cell carcinoma of skin of left upper limb, including shoulder: Secondary | ICD-10-CM | POA: Diagnosis not present

## 2021-05-30 DIAGNOSIS — I5033 Acute on chronic diastolic (congestive) heart failure: Secondary | ICD-10-CM | POA: Diagnosis not present

## 2021-05-30 DIAGNOSIS — X32XXXD Exposure to sunlight, subsequent encounter: Secondary | ICD-10-CM | POA: Diagnosis not present

## 2021-05-30 DIAGNOSIS — A0472 Enterocolitis due to Clostridium difficile, not specified as recurrent: Secondary | ICD-10-CM | POA: Diagnosis not present

## 2021-06-04 ENCOUNTER — Encounter: Payer: Self-pay | Admitting: Pharmacist

## 2021-06-04 NOTE — Progress Notes (Signed)
CARE PLAN ENTRY  06/04/2021 Name: [name] MRN: [id number] DOB: 03-Dec-1945  Larey Brick is enrolled in Remote Patient Monitoring/Principle Care Monitoring.  Date of Enrollment: 03/21/20 Supervising physician: Vernell Leep Indication: HTN  Remote Readings: Compliant and Avg BP: 143/89, HR:75  Next scheduled OV: 08/09/21  Pharmacist Clinical Goal(s):  Over the next 90 days, patient will demonstrate Improved medication adherence as evidenced by medication fill history Over the next 90 days, patient will demonstrate improved understanding of prescribed medications and rationale for usage as evidenced by patient teach back Over the next 90 days, patient will experience decrease in ED visits. ED visits in last 6 months = 0 Over the next 90 days, patient will not experience hospital admission. Hospital Admissions in last 6 months = 0  Interventions: Provider and Inter-disciplinary care team collaboration (see longitudinal plan of care) Comprehensive medication review performed. Discussed plans with patient for ongoing care management follow up and provided patient with direct contact information for care management team Collaboration with provider re: medication management  Patient Self Care Activities:  Self administers medications as prescribed Attends all scheduled provider appointments Performs ADL's independently Performs IADL's independently  Allergies  Allergen Reactions   Doxycycline Hives and Itching    Hives and itching   Penicillins Swelling    Swelling, rash   Azithromycin Other (See Comments)    Other reaction(s): abdominal pain   Codeine Other (See Comments)     nausea   Sulfa Antibiotics Rash   Outpatient Encounter Medications as of 06/04/2021  Medication Sig   acetaminophen (TYLENOL) 325 MG tablet Take 650 mg by mouth every 6 (six) hours as needed for mild pain, fever or headache.   allopurinol (ZYLOPRIM) 100 MG tablet Take 150 mg by mouth  daily.   Cholecalciferol (D3 2000) 50 MCG (2000 UT) CAPS Take 2,000 Units by mouth daily.   felodipine (PLENDIL) 10 MG 24 hr tablet Take 10 mg by mouth daily.   fexofenadine (ALLEGRA) 60 MG tablet Take 60 mg by mouth as needed for allergies or rhinitis.   Fiber CAPS Take 1 capsule by mouth daily.   furosemide (LASIX) 40 MG tablet Take 1 tablet (40 mg total) by mouth daily.   guaiFENesin-dextromethorphan (ROBITUSSIN DM) 100-10 MG/5ML syrup Take 5 mLs by mouth every 4 (four) hours as needed for cough.   lamoTRIgine (LAMICTAL) 150 MG tablet TAKE TWO TABLETS BY MOUTH TWICE A DAY   losartan (COZAAR) 50 MG tablet Take 1 tablet (50 mg total) by mouth daily.   Multiple Vitamin (MULTIVITAMIN) tablet Take 1 tablet by mouth daily.   ondansetron (ZOFRAN) 4 MG tablet Take 1 tablet (4 mg total) by mouth every 6 (six) hours as needed for nausea.   pantoprazole (PROTONIX) 40 MG tablet Take 1 tablet (40 mg total) by mouth daily at 6 (six) AM.   prednisoLONE acetate (PRED FORTE) 1 % ophthalmic suspension Place 1 drop into the left eye at bedtime.    primidone (MYSOLINE) 50 MG tablet TAKE ONE TABLET BY MOUTH EVERY MORNING AND TAKE ONE TABLET BY MOUTH EVERY NIGHT AT BEDTIME   simvastatin (ZOCOR) 20 MG tablet Take 20 mg by mouth daily.   zolpidem (AMBIEN) 10 MG tablet Take 5-10 mg by mouth at bedtime as needed for sleep.   No facility-administered encounter medications on file as of 06/04/2021.    Hypertension   BP goal is:  <140/90  Office blood pressures are  BP Readings from Last 3 Encounters:  05/04/21 (!) 142/87  04/07/21 (!) 157/78  04/05/21 124/70    Patient is currently controlled on the following medications: losartan 50 mg, lasix 40 mg, felodipine 10 mg,   Patient checks BP at home daily  Patient home BP readings are ranging: 118-168/65-100  Patient has tried  these meds in the past: atenolol, bisoprolol, losartan, labetalol,   We discussed diet and exercise  extensively  Plan  Continue current medications and control with diet and exercise   Reviewed readings with pt. Pt reports that complains of lightheadedness and dizziness have improved and denies any further falls or injuries. Recent kenalog injections for chronic shoulder pain. 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 Will continue current therapy and continue monitoring.  ______________ Visit Information SDOH (Social Determinants of Health) assessments performed: Yes.  Mr. Rodino was given information about Principle Care Management/Remote Patient Monitoring services today including:  RPM/PCM service includes personalized support from designated clinical staff supervised by his physician, including individualized plan of care and coordination with other care providers 24/7 contact phone numbers for assistance for urgent and routine care needs. Standard insurance, coinsurance, copays and deductibles apply for principle care management only during months in which we provide at least 30 minutes of these services. Most insurances cover these services at 100%, however patients may be responsible for any copay, coinsurance and/or deductible if applicable. This service may help you avoid the need for more expensive face-to-face services. Only one practitioner may furnish and bill the service in a calendar month. The patient may stop PCM/RPM services at any time (effective at the end of the month) by phone call to the office staff.  Patient agreed to services and verbal consent obtained.   Manuela Schwartz, Pharm.D. [facility] [phone] Ext: 120

## 2021-06-08 DIAGNOSIS — H209 Unspecified iridocyclitis: Secondary | ICD-10-CM | POA: Diagnosis not present

## 2021-06-08 DIAGNOSIS — H43811 Vitreous degeneration, right eye: Secondary | ICD-10-CM | POA: Diagnosis not present

## 2021-06-10 DIAGNOSIS — I10 Essential (primary) hypertension: Secondary | ICD-10-CM | POA: Diagnosis not present

## 2021-06-12 DIAGNOSIS — J9690 Respiratory failure, unspecified, unspecified whether with hypoxia or hypercapnia: Secondary | ICD-10-CM | POA: Diagnosis not present

## 2021-06-12 DIAGNOSIS — J471 Bronchiectasis with (acute) exacerbation: Secondary | ICD-10-CM | POA: Diagnosis not present

## 2021-06-12 DIAGNOSIS — G4733 Obstructive sleep apnea (adult) (pediatric): Secondary | ICD-10-CM | POA: Diagnosis not present

## 2021-06-12 DIAGNOSIS — J188 Other pneumonia, unspecified organism: Secondary | ICD-10-CM | POA: Diagnosis not present

## 2021-06-26 ENCOUNTER — Ambulatory Visit (INDEPENDENT_AMBULATORY_CARE_PROVIDER_SITE_OTHER): Payer: Medicare PPO | Admitting: Pulmonary Disease

## 2021-06-26 ENCOUNTER — Ambulatory Visit (INDEPENDENT_AMBULATORY_CARE_PROVIDER_SITE_OTHER): Payer: Medicare PPO

## 2021-06-26 ENCOUNTER — Encounter: Payer: Self-pay | Admitting: Pulmonary Disease

## 2021-06-26 ENCOUNTER — Other Ambulatory Visit: Payer: Self-pay

## 2021-06-26 VITALS — BP 140/84 | HR 74 | Temp 98.5°F | Ht 70.0 in | Wt 178.2 lb

## 2021-06-26 DIAGNOSIS — J181 Lobar pneumonia, unspecified organism: Secondary | ICD-10-CM

## 2021-06-26 DIAGNOSIS — G4733 Obstructive sleep apnea (adult) (pediatric): Secondary | ICD-10-CM

## 2021-06-26 DIAGNOSIS — J986 Disorders of diaphragm: Secondary | ICD-10-CM

## 2021-06-26 NOTE — Assessment & Plan Note (Signed)
Chest x-ray was repeated today which shows persistent elevation left hemidiaphragm and minimal left basilar atelectasis to my review. No evidence of pneumonia

## 2021-06-26 NOTE — Assessment & Plan Note (Signed)
CPAP download was reviewed which shows excellent control of events on 12 cm, great compliance and large leak.  He is very compliant and necessarily helped improve his daytime somnolence and fatigue. Not sure if he still needs oxygen on discharge, this may be related to his acute hospitalization.  We will check nocturnal oximetry on CPAP/room air  Weight loss encouraged, compliance with goal of at least 4-6 hrs every night is the expectation. Advised against medications with sedative side effects Cautioned against driving when sleepy - understanding that sleepiness will vary on a day to day basis

## 2021-06-26 NOTE — Progress Notes (Signed)
   Subjective:    Patient ID: [name], male    DOB: [birth year], 75 y.o.   MRN: [id number]  HPI  75 yo for FU of obstructive sleep apnea And mild bronchiectasis .    PMH - difficult to control hypertension  Accompanied by his wife Opal Sidles who corroborates history, last discharge summary reviewed He was hospitalized 03/2021 for fever, chest x-ray suggested left lower lobe infiltrate and he was placed on antibiotics, subsequent urine culture showed 10,000 Enterococcus, antibiotics was continued.  Unfortunately developed C. difficile colitis and is on vancomycin for this. 03/11/21 CTA chest did not show pulm embolism, showed bibasilar atelectasis and scarring with a small left pleural effusion, prominent mediastinal lymphadenopathy similar to 2016  Results cough or wheezing or shortness of breath.  He was provided with nocturnal oxygen and he wonders if he still needs this. He obtained a new CPAP since his last visit, no problems with mask or pressure we reviewed download report   Significant tests/ events reviewed  PSG 02/2002 - mild OSA with AHI 16/h, lowest satn of 70% >> CPAP 9   10/30/2011 SPlit study showed O2 still required -esp during REM sleep  Increased CpAP to 12 cm , Keep 3 L O2 blended in    07/2016 PFTs nml  Review of Systems neg for any significant sore throat, dysphagia, itching, sneezing, nasal congestion or excess/ purulent secretions, fever, chills, sweats, unintended wt loss, pleuritic or exertional cp, hempoptysis, orthopnea pnd or change in chronic leg swelling. Also denies presyncope, palpitations, heartburn, abdominal pain, nausea, vomiting, diarrhea or change in bowel or urinary habits, dysuria,hematuria, rash, arthralgias, visual complaints, headache, numbness weakness or ataxia.     Objective:   Physical Exam  Gen. Pleasant, well-nourished, in no distress ENT - no thrush, no pallor/icterus,no post nasal drip Neck: No JVD, no thyromegaly, no carotid  bruits Lungs: no use of accessory muscles, no dullness to percussion, decreased without rales or rhonchi  Cardiovascular: Rhythm regular, heart sounds  normal, no murmurs or gallops, no peripheral edema Musculoskeletal: No deformities, no cyanosis or clubbing        Assessment & Plan:

## 2021-06-26 NOTE — Patient Instructions (Signed)
  Check ONO on CPAP / no oxygen  CPAP is working well  Left lower lung has some scarring & elevated diaphragm

## 2021-06-27 DIAGNOSIS — X32XXXD Exposure to sunlight, subsequent encounter: Secondary | ICD-10-CM | POA: Diagnosis not present

## 2021-06-27 DIAGNOSIS — L570 Actinic keratosis: Secondary | ICD-10-CM | POA: Diagnosis not present

## 2021-06-27 DIAGNOSIS — Z08 Encounter for follow-up examination after completed treatment for malignant neoplasm: Secondary | ICD-10-CM | POA: Diagnosis not present

## 2021-06-27 DIAGNOSIS — Z85828 Personal history of other malignant neoplasm of skin: Secondary | ICD-10-CM | POA: Diagnosis not present

## 2021-07-03 DIAGNOSIS — G4730 Sleep apnea, unspecified: Secondary | ICD-10-CM | POA: Diagnosis not present

## 2021-07-03 DIAGNOSIS — R0683 Snoring: Secondary | ICD-10-CM | POA: Diagnosis not present

## 2021-07-06 DIAGNOSIS — Z125 Encounter for screening for malignant neoplasm of prostate: Secondary | ICD-10-CM | POA: Diagnosis not present

## 2021-07-06 DIAGNOSIS — E7801 Familial hypercholesterolemia: Secondary | ICD-10-CM | POA: Diagnosis not present

## 2021-07-06 DIAGNOSIS — I10 Essential (primary) hypertension: Secondary | ICD-10-CM | POA: Diagnosis not present

## 2021-07-07 ENCOUNTER — Telehealth: Payer: Self-pay | Admitting: Pulmonary Disease

## 2021-07-07 NOTE — Telephone Encounter (Signed)
ONO on CPAP/room air shows minimal desaturation for 4 minutes total less than 88%.  Oxygenation has improved significantly. Okay to discontinue oxygen during sleep report back in 1 month.  If he does well then we can discontinue order

## 2021-07-10 ENCOUNTER — Encounter: Payer: Self-pay | Admitting: Pulmonary Disease

## 2021-07-10 DIAGNOSIS — G4700 Insomnia, unspecified: Secondary | ICD-10-CM | POA: Diagnosis not present

## 2021-07-10 DIAGNOSIS — D696 Thrombocytopenia, unspecified: Secondary | ICD-10-CM | POA: Diagnosis not present

## 2021-07-10 DIAGNOSIS — I5032 Chronic diastolic (congestive) heart failure: Secondary | ICD-10-CM | POA: Diagnosis not present

## 2021-07-10 DIAGNOSIS — G629 Polyneuropathy, unspecified: Secondary | ICD-10-CM | POA: Diagnosis not present

## 2021-07-10 DIAGNOSIS — Z1212 Encounter for screening for malignant neoplasm of rectum: Secondary | ICD-10-CM | POA: Diagnosis not present

## 2021-07-10 DIAGNOSIS — G4733 Obstructive sleep apnea (adult) (pediatric): Secondary | ICD-10-CM | POA: Diagnosis not present

## 2021-07-10 DIAGNOSIS — J387 Other diseases of larynx: Secondary | ICD-10-CM | POA: Diagnosis not present

## 2021-07-10 DIAGNOSIS — I10 Essential (primary) hypertension: Secondary | ICD-10-CM | POA: Diagnosis not present

## 2021-07-10 DIAGNOSIS — Z0000 Encounter for general adult medical examination without abnormal findings: Secondary | ICD-10-CM | POA: Diagnosis not present

## 2021-07-10 DIAGNOSIS — I700 Atherosclerosis of aorta: Secondary | ICD-10-CM | POA: Diagnosis not present

## 2021-07-10 NOTE — Telephone Encounter (Signed)
See My chart message

## 2021-07-12 DIAGNOSIS — I700 Atherosclerosis of aorta: Secondary | ICD-10-CM | POA: Diagnosis not present

## 2021-07-13 DIAGNOSIS — H209 Unspecified iridocyclitis: Secondary | ICD-10-CM | POA: Diagnosis not present

## 2021-07-13 DIAGNOSIS — H43811 Vitreous degeneration, right eye: Secondary | ICD-10-CM | POA: Diagnosis not present

## 2021-07-13 DIAGNOSIS — Z8669 Personal history of other diseases of the nervous system and sense organs: Secondary | ICD-10-CM | POA: Diagnosis not present

## 2021-08-07 ENCOUNTER — Other Ambulatory Visit: Payer: Self-pay | Admitting: Cardiology

## 2021-08-08 NOTE — Progress Notes (Signed)
Follow up visit  Subjective:   [name], male    DOB: Sep 03, 1945, 76 y.o.   MRN: [id number]   HPI   Chief Complaint  Patient presents with   Congestive Heart Failure   Hypertension   Follow-up    3 month    76 y.o. Caucasian male with hypertension, h/o cancer, peripheral neuropathy, HFpEF, pneumonia (8/20220  Patient is doing well. Blood pressure elevated in the office today, more than avg home BP of 140.84 mmHg. It appears that I never sent the spironolactone prescription the last time.   Current Outpatient Medications on File Prior to Visit  Medication Sig Dispense Refill   acetaminophen (TYLENOL) 325 MG tablet Take 650 mg by mouth every 6 (six) hours as needed for mild pain, fever or headache.     allopurinol (ZYLOPRIM) 100 MG tablet Take 150 mg by mouth daily.     Cholecalciferol (D3 2000) 50 MCG (2000 UT) CAPS Take 2,000 Units by mouth daily.     felodipine (PLENDIL) 10 MG 24 hr tablet Take 10 mg by mouth daily.     fexofenadine (ALLEGRA) 60 MG tablet Take 60 mg by mouth as needed for allergies or rhinitis.     Fiber CAPS Take 1 capsule by mouth daily.     furosemide (LASIX) 40 MG tablet TAKE ONE TABLET BY MOUTH DAILY 90 tablet 1   guaiFENesin-dextromethorphan (ROBITUSSIN DM) 100-10 MG/5ML syrup Take 5 mLs by mouth every 4 (four) hours as needed for cough. (Patient not taking: Reported on 06/26/2021) 118 mL 0   lamoTRIgine (LAMICTAL) 150 MG tablet TAKE TWO TABLETS BY MOUTH TWICE A DAY 360 tablet 1   losartan (COZAAR) 50 MG tablet Take 1 tablet (50 mg total) by mouth daily. 90 tablet 3   Multiple Vitamin (MULTIVITAMIN) tablet Take 1 tablet by mouth daily.     ondansetron (ZOFRAN) 4 MG tablet Take 1 tablet (4 mg total) by mouth every 6 (six) hours as needed for nausea. 20 tablet 0   pantoprazole (PROTONIX) 40 MG tablet Take 1 tablet (40 mg total) by mouth daily at 6 (six) AM. 60 tablet 0   prednisoLONE acetate (PRED FORTE) 1 % ophthalmic suspension Place 1 drop into  the left eye at bedtime.      primidone (MYSOLINE) 50 MG tablet TAKE ONE TABLET BY MOUTH EVERY MORNING AND TAKE ONE TABLET BY MOUTH EVERY NIGHT AT BEDTIME 180 tablet 1   simvastatin (ZOCOR) 20 MG tablet Take 20 mg by mouth daily.     vancomycin (VANCOCIN) 125 MG capsule      zolpidem (AMBIEN) 10 MG tablet Take 5-10 mg by mouth at bedtime as needed for sleep.     No current facility-administered medications on file prior to visit.    Cardiovascular & other pertient studies:  Echocardiogram 04/19/2021:  Study Quality: Technically difficult study.  Normal LV systolic function with visual EF 50-55%. Left ventricle cavity  is normal in size. Severe left ventricular hypertrophy. Normal global wall  motion. Doppler evidence of grade II (pseudonormal) diastolic dysfunction,  indeterminate LAP.  Left atrial cavity is mildly dilated.  Trace aortic regurgitation.  Mild (Grade I) mitral regurgitation.  No evidence of pulmonary hypertension.  Compared to study 03/04/2020: LVEF was 60-65% now 50-55%, LAE was moderate  now mild, otherwise no significant change.   EKG 04/07/2021: Sinus tachycardia 102 bpm  Nonspecific T-abnormality  CTA chest 03/11/2021: 1. No evidence of pulmonary embolism. 2. Small left pleural effusion and bibasilar atelectasis/scarring.  Aortic Atherosclerosis (ICD10-I70.0).   Echocardiogram w/strain imaging 03/04/2020:  1. Normal GLS -20.9. Left ventricular ejection fraction, by estimation,  is 60 to 65%. The left ventricle has normal function. The left ventricle  has no regional wall motion abnormalities. There is mild left ventricular  hypertrophy. Left ventricular  diastolic parameters are consistent with Grade II diastolic dysfunction  (pseudonormalization). Elevated left ventricular end-diastolic pressure.  Normal GLS -20.9.  2. Right ventricular systolic function is normal. The right ventricular  size is normal.   3. Left atrial size was moderately dilated.   4. The  mitral valve is normal in structure. Mild mitral valve  regurgitation. No evidence of mitral stenosis.   5. The aortic valve is tricuspid. Aortic valve regurgitation is trivial.  Mild aortic valve sclerosis is present, with no evidence of aortic valve  stenosis.   6. The inferior vena cava is normal in size with greater than 50%  respiratory variability, suggesting right atrial pressure of 3 mmHg.    Recent labs: 04/14/2021: Glucose 104, BUN/Cr 19/1.08. EGFR 71. Na/K 141/4.6.  BNP 55  03/20/2021: Glucose 101, BUN/Cr 12/1.16. EGFR >60. Na/K 135/4.7.  H/H 11/32. MCV 91. Platelets 186 D-Dimer 2.54 high Results for [name], [name] (MRN [id number]) as of 04/06/2021 21:22  Ref. Range 03/13/2021 12:13 03/16/2021 04:47 03/17/2021 05:08  B Natriuretic Peptide Latest Ref Range: 0.0 - 100.0 pg/mL 400.0 (H) 348.1 (H) 257.0 (H)   11/08/2020: Glucose 68, BUN/Cr 22/1.12. EGFR 68. Na/K 142/4.8.   06/24/2019: Glucose 89, BUN/Cr 16/1.1. EGFR 69. Na/K 145/4.8. Rest of the CMP normal H/H 13/40. MCV 90. Platelets 143 Chol 130, TG 94, HDL 44, LDL 67   Review of Systems  Cardiovascular:  Negative for chest pain, dyspnea on exertion, leg swelling, palpitations and syncope.  Musculoskeletal:  Positive for back pain.       Vitals:   08/09/21 1406  BP: (!) 160/98  Pulse: 81  Resp: 16  Temp: (!) 97 F (36.1 C)  SpO2: 98%    Body mass index is 26.54 kg/m. Filed Weights   08/09/21 1406  Weight: 185 lb (83.9 kg)     Objective:   Physical Exam Vitals and nursing note reviewed.  Constitutional:      General: He is not in acute distress. Neck:     Vascular: No JVD.  Cardiovascular:     Rate and Rhythm: Normal rate and regular rhythm.     Heart sounds: Normal heart sounds. No murmur heard. Pulmonary:     Effort: Pulmonary effort is normal.     Breath sounds: Normal breath sounds. No wheezing or rales.  Musculoskeletal:     Right lower leg: No edema.     Left lower leg: No edema.        Assessment & Recommendations:   76 y.o. Caucasian male with hypertension, h/o cancer, peripheral neuropathy, HDpEF  HFpEF: Normal EF, grade III diastolic function at baseline with normal strain. Low suspicion for infiltrative cardiomyopathy. 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  While is euvolumic and asymptomatic at baseline, I think he rapidly developed volume overload with IV fluids and antibiotics for pneumonia during his hospitalization min 03/2021. Back to baseline now. BNP normal. Continue losartan 50 mg.  Added spironolactone 25 mg daily, with which I expect better blood pressure control Check BMP in 1 week  Hypertension: As above. Also on felodipine.  F/u in 3 months  Tracy, MD [facility] Pager: [phone] Office: [phone]

## 2021-08-09 ENCOUNTER — Encounter: Payer: Self-pay | Admitting: Cardiology

## 2021-08-09 ENCOUNTER — Other Ambulatory Visit: Payer: Self-pay

## 2021-08-09 ENCOUNTER — Ambulatory Visit: Payer: Medicare (Managed Care) | Admitting: Cardiology

## 2021-08-09 VITALS — BP 160/98 | HR 81 | Temp 97.0°F | Resp 16 | Ht 70.0 in | Wt 185.0 lb

## 2021-08-09 DIAGNOSIS — I10 Essential (primary) hypertension: Secondary | ICD-10-CM

## 2021-08-09 DIAGNOSIS — I5032 Chronic diastolic (congestive) heart failure: Secondary | ICD-10-CM

## 2021-08-09 MED ORDER — SPIRONOLACTONE 25 MG PO TABS: 25.0000 mg | ORAL_TABLET | Freq: Every day | ORAL | 3 refills | Status: DC

## 2021-08-09 NOTE — Telephone Encounter (Signed)
Please advise on D/C on the oxygen.

## 2021-08-10 ENCOUNTER — Other Ambulatory Visit: Payer: Self-pay | Admitting: *Deleted

## 2021-08-10 DIAGNOSIS — G4733 Obstructive sleep apnea (adult) (pediatric): Secondary | ICD-10-CM

## 2021-08-24 LAB — BASIC METABOLIC PANEL
BUN/Creatinine Ratio: 22 (ref 10–24)
BUN: 27 mg/dL (ref 8–27)
CO2: 27 mmol/L (ref 20–29)
Calcium: 9.6 mg/dL (ref 8.6–10.2)
Chloride: 96 mmol/L (ref 96–106)
Creatinine, Ser: 1.22 mg/dL (ref 0.76–1.27)
Glucose: 90 mg/dL (ref 70–99)
Potassium: 4.5 mmol/L (ref 3.5–5.2)
Sodium: 137 mmol/L (ref 134–144)
eGFR: 61 mL/min/{1.73_m2} (ref >59)

## 2021-08-29 DIAGNOSIS — H43811 Vitreous degeneration, right eye: Secondary | ICD-10-CM | POA: Diagnosis not present

## 2021-08-29 DIAGNOSIS — H18603 Keratoconus, unspecified, bilateral: Secondary | ICD-10-CM | POA: Diagnosis not present

## 2021-08-29 DIAGNOSIS — H35352 Cystoid macular degeneration, left eye: Secondary | ICD-10-CM | POA: Diagnosis not present

## 2021-08-29 DIAGNOSIS — Z9889 Other specified postprocedural states: Secondary | ICD-10-CM | POA: Diagnosis not present

## 2021-08-29 DIAGNOSIS — H209 Unspecified iridocyclitis: Secondary | ICD-10-CM | POA: Diagnosis not present

## 2021-08-29 DIAGNOSIS — Z8669 Personal history of other diseases of the nervous system and sense organs: Secondary | ICD-10-CM | POA: Diagnosis not present

## 2021-09-01 ENCOUNTER — Telehealth: Payer: Self-pay | Admitting: Cardiology

## 2021-09-01 DIAGNOSIS — E782 Mixed hyperlipidemia: Secondary | ICD-10-CM | POA: Insufficient documentation

## 2021-09-01 NOTE — Telephone Encounter (Signed)
07/06/2021: Glucose 107, BUN/Cr 24/1.1. EGFR 62. Na/K 143/4.6. Rest of the CMP normal H/H 15/47. MCV 90. Platelets 137 Chol 206, TG 187, HDL 46, LDL 122  Spoke with the patient. Dr Shelia Media had recently changed simvastatin to Crestor 20 mg. Has repeat lipid panel to be checked in Feb 2023.   Glenn Mormon, MD Pager: [phone] Office: [phone]

## 2021-10-03 ENCOUNTER — Other Ambulatory Visit: Payer: Self-pay

## 2021-10-03 ENCOUNTER — Encounter: Payer: Self-pay | Admitting: Family Medicine

## 2021-10-03 ENCOUNTER — Ambulatory Visit (INDEPENDENT_AMBULATORY_CARE_PROVIDER_SITE_OTHER): Payer: Medicare (Managed Care) | Admitting: Family Medicine

## 2021-10-03 VITALS — BP 157/89 | HR 82 | Ht 70.0 in | Wt 188.0 lb

## 2021-10-03 DIAGNOSIS — G250 Essential tremor: Secondary | ICD-10-CM | POA: Diagnosis not present

## 2021-10-03 DIAGNOSIS — R208 Other disturbances of skin sensation: Secondary | ICD-10-CM

## 2021-10-03 MED ORDER — LAMOTRIGINE 150 MG PO TABS: 300.0000 mg | ORAL_TABLET | Freq: Two times a day (BID) | ORAL | 3 refills | Status: DC

## 2021-10-03 MED ORDER — PRIMIDONE 50 MG PO TABS: ORAL_TABLET | ORAL | 3 refills | Status: DC

## 2021-10-03 NOTE — Progress Notes (Signed)
Chief Complaint  Patient presents with   Follow-up    Pt with wife, rm 74. Pt here for follow up visit and states things are things are stable     HISTORY OF PRESENT ILLNESS:  10/03/21 ALL:  [name] is a 76 y.o. male here today for follow up for dysesthesias and tremor. He feels that he is doing well.   He continues lamotrigine 300mg  BID. Dysesthesias are stable. He has some pain in both legs and feet but feels it is manageable. No limitations of gait or functioning. He is sleeping well.   He was previously taking atenolol for ET. Cardiology changed med to labetalol for BP management. He was restarted on primidone 50mg  BID (previously not tolerated). He reports tremor is better managed on primidone. He is tolerating well. He had some dizziness for a few days after starting but feels symptoms have resolved. Cardiology has stopped labetalol.    HISTORY (copied from Dr Garth Bigness previous note)  [name] is a 76 y.o. 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   Update 09/30/20: He feels his polyneuropathy is stable.    Lamotrigine helps the dysesthesias.    Neurontin and a tricyclic antidepressant did not help the pain. Blood work including B12, cryoglobulins and SPEP/IEF had been essentially normal in the past, last tested in 2013. Nerve conduction study in the past was mildly abnormal but more recent one showed good functioning of the large fibers.  He sometimes notes dry eyes.       Tremors are slightly worse.    He notes it left > right hand.   He is right handed and this does not bother him much.    He is on atenolol 75 mg po bid.   Primidone was not well tolerated.    We went over options.   Tremor is not bad enough to try a benzo or to refer for DBS.       His gait is the same with a mild reduced balance..   He has had no further episode of presyncope.  He tries to stay active and walks some daily.    He tries to walk 1/4 mile a couple tmes a  day but spine hurts more with longer distance.     He is on CPAP and tolerating well.   REVIEW OF SYSTEMS: Out of a complete 14 system review of symptoms, the patient complains only of the following symptoms, tremor, neuropathy and all other reviewed systems are negative.   ALLERGIES: Allergies  Allergen Reactions   Doxycycline Hives and Itching    Hives and itching   Penicillins Swelling    Swelling, rash   Azithromycin Other (See Comments)    Other reaction(s): abdominal pain   Codeine Other (See Comments)     nausea   Sulfa Antibiotics Rash     HOME MEDICATIONS: Outpatient Medications Prior to Visit  Medication Sig Dispense Refill   acetaminophen (TYLENOL) 325 MG tablet Take 650 mg by mouth every 6 (six) hours as needed for mild pain, fever or headache.     allopurinol (ZYLOPRIM) 100 MG tablet Take 150 mg by mouth daily.     Cholecalciferol (D3 2000) 50 MCG (2000 UT) CAPS Take 2,000 Units by mouth daily.     felodipine (PLENDIL) 10 MG 24 hr tablet Take 10 mg by mouth daily.     fexofenadine (ALLEGRA) 60 MG tablet Take 60 mg by mouth as needed for allergies or rhinitis.  Fiber CAPS Take 1 capsule by mouth daily.     furosemide (LASIX) 40 MG tablet TAKE ONE TABLET BY MOUTH DAILY 90 tablet 1   ketorolac (ACULAR) 0.5 % ophthalmic solution daily.     ketorolac (ACULAR) 0.5 % ophthalmic solution Place 1 drop into both eyes 4 times daily.     Multiple Vitamin (MULTIVITAMIN) tablet Take 1 tablet by mouth daily.     mycophenolate (CELLCEPT) 500 MG tablet Take 500 mg by mouth in the morning and at bedtime.     ondansetron (ZOFRAN) 4 MG tablet Take 1 tablet (4 mg total) by mouth every 6 (six) hours as needed for nausea. 20 tablet 0   pantoprazole (PROTONIX) 40 MG tablet Take 1 tablet (40 mg total) by mouth daily at 6 (six) AM. 60 tablet 0   prednisoLONE acetate (PRED FORTE) 1 % ophthalmic suspension Place 4 drops into both eyes daily.     rosuvastatin (CRESTOR) 20 MG tablet Take 20  mg by mouth at bedtime.     spironolactone (ALDACTONE) 25 MG tablet Take 1 tablet (25 mg total) by mouth daily. 30 tablet 3   vancomycin (VANCOCIN) 125 MG capsule      zolpidem (AMBIEN) 10 MG tablet Take 5-10 mg by mouth at bedtime as needed for sleep.     lamoTRIgine (LAMICTAL) 150 MG tablet TAKE TWO TABLETS BY MOUTH TWICE A DAY 360 tablet 1   primidone (MYSOLINE) 50 MG tablet TAKE ONE TABLET BY MOUTH EVERY MORNING AND TAKE ONE TABLET BY MOUTH EVERY NIGHT AT BEDTIME 180 tablet 1   losartan (COZAAR) 50 MG tablet Take 1 tablet (50 mg total) by mouth daily. 90 tablet 3   simvastatin (ZOCOR) 20 MG tablet Take 20 mg by mouth daily.     No facility-administered medications prior to visit.     PAST MEDICAL HISTORY: Past Medical History:  Diagnosis Date   Cancer ([place])    Elevated diaphragm    Gout    History of prostate cancer Feb 1993   Hyperlipidemia    Hypertension    Hypoxemia    Normocytic anemia    OSA (obstructive sleep apnea)    Peripheral neuropathy    Pneumonia    Vision abnormalities      PAST SURGICAL HISTORY: Past Surgical History:  Procedure Laterality Date   APPENDECTOMY     PROSTATECTOMY     SHOULDER SURGERY     bone spur removed from right     FAMILY HISTORY: Family History  Problem Relation Age of Onset   Emphysema Father    Congestive Heart Failure Mother    Emphysema Brother    Asthma Daughter      SOCIAL HISTORY: Social History   Socioeconomic History   Marital status: Married    Spouse name: Not on file   Number of children: 2   Years of education: Not on file   Highest education level: Not on file  Occupational History   Occupation: Retired    Comment: Company secretary  Tobacco Use   Smoking status: Never   Smokeless tobacco: Never  Vaping Use   Vaping Use: Never used  Substance and Sexual Activity   Alcohol use: Not Currently   Drug use: No   Sexual activity: Not Currently  Other Topics Concern   Not on file  Social History Narrative    Not on file   Social Determinants of Health   Financial Resource Strain: Not on file  Food Insecurity: Not on file  Transportation Needs: Not on file  Physical Activity: Not on file  Stress: Not on file  Social Connections: Not on file  Intimate Partner Violence: Not on file     PHYSICAL EXAM  Vitals:   10/03/21 1309  BP: (!) 157/89  Pulse: 82  Weight: 188 lb (85.3 kg)  Height: 5\' 10"  (1.778 m)   Body mass index is 26.98 kg/m.  Generalized: Well developed, in no acute distress  Cardiology: normal rate and rhythm, no murmur auscultated  Respiratory: clear to auscultation bilaterally    Neurological examination  Mentation: Alert oriented to time, place, history taking. Follows all commands speech and language fluent Cranial nerve II-XII: Pupils were equal round reactive to light. Extraocular movements were full, visual field were full on confrontational test. Facial sensation and strength were normal. Head turning and shoulder shrug  were normal and symmetric. Motor: The motor testing reveals 5 over 5 strength of all 4 extremities. Good symmetric motor tone is noted throughout. Mild action tremor noted of left hand  Sensory: Sensory testing is intact to soft touch on all 4 extremities. No evidence of extinction is noted.  Coordination: Cerebellar testing reveals good finger-nose-finger and heel-to-shin bilaterally.  Gait and station: Gait is normal.  Reflexes: Deep tendon reflexes are symmetric and normal bilaterally.    DIAGNOSTIC DATA (LABS, IMAGING, TESTING) - I reviewed patient records, labs, notes, testing and imaging myself where available.  Lab Results  Component Value Date   WBC 10.5 03/15/2021   HGB 11.0 (L) 03/15/2021   HCT 32.0 (L) 03/15/2021   MCV 91.2 03/15/2021   PLT 186 03/15/2021      Component Value Date/Time   NA 137 08/23/2021 1417   K 4.5 08/23/2021 1417   CL 96 08/23/2021 1417   CO2 27 08/23/2021 1417   GLUCOSE 90 08/23/2021 1417    GLUCOSE 101 (H) 03/20/2021 0408   BUN 27 08/23/2021 1417   CREATININE 1.22 08/23/2021 1417   CALCIUM 9.6 08/23/2021 1417   PROT 6.3 (L) 03/11/2021 0537   PROT 6.8 10/03/2020 1335   ALBUMIN 3.0 (L) 03/13/2021 0413   AST 18 03/11/2021 0537   ALT 13 03/11/2021 0537   ALKPHOS 87 03/11/2021 0537   BILITOT 0.9 03/11/2021 0537   GFRNONAA >60 03/20/2021 0408   GFRAA 67 04/13/2020 1104   Lab Results  Component Value Date   CHOL 126 01/20/2011   HDL 51 01/20/2011   LDLCALC 63 01/20/2011   TRIG 60 01/20/2011   CHOLHDL 2.5 01/20/2011   No results found for: HGBA1C Lab Results  Component Value Date   VHQIONGE95 284 12/14/2014   Lab Results  Component Value Date   TSH 2.255 02/08/2011    No flowsheet data found.   No flowsheet data found.   ASSESSMENT AND PLAN  76 y.o. year old male  has a past medical history of Cancer ([place]), Elevated diaphragm, Gout, History of prostate cancer (Feb 1993), Hyperlipidemia, Hypertension, Hypoxemia, Normocytic anemia, OSA (obstructive sleep apnea), Peripheral neuropathy, Pneumonia, and Vision abnormalities. here with    Essential tremor - Plan: Primidone level  Dysesthesia - Plan: Lamotrigine level  Jordie is doing well, today. We will continue lamotrigine 300mg  and primidone 50mg  twice daily. We will update labs. He will continues healthy lifestyle habits. Follow up in 1 year.   Orders Placed This Encounter  Procedures   Lamotrigine level   Primidone level     Meds ordered this encounter  Medications   primidone (MYSOLINE) 50 MG tablet  Sig: TAKE ONE TABLET BY MOUTH EVERY MORNING AND TAKE ONE TABLET BY MOUTH EVERY NIGHT AT BEDTIME    Dispense:  180 tablet    Refill:  3    Order Specific Question:   Supervising Provider    Answer:   Melvenia Beam [[id number]]   lamoTRIgine (LAMICTAL) 150 MG tablet    Sig: Take 2 tablets (300 mg total) by mouth 2 (two) times daily.    Dispense:  360 tablet    Refill:  3    Order Specific Question:    Supervising Provider    Answer:   Melvenia Beam [[id number]]      Debbora Presto, MSN, FNP-C 10/03/2021, 1:43 PM  [facility] [address] [place], [place] [address] [phone]

## 2021-10-03 NOTE — Patient Instructions (Signed)
Below is our plan:  We will continue lamotrigine 300mg  and primidone 50mg  twice daily. I will update labs, today.   Please make sure you are staying well hydrated. I recommend 50-60 ounces daily. Well balanced diet and regular exercise encouraged. Consistent sleep schedule with 6-8 hours recommended.   Please continue follow up with care team as directed.   Follow up with me in 1 year   You may receive a survey regarding today's visit. I encourage you to leave honest feed back as I do use this information to improve patient care. Thank you for seeing me today!

## 2021-10-05 LAB — PRIMIDONE, SERUM
Phenobarbital, Serum: 4 ug/mL — ABNORMAL LOW (ref 15–40)
Primidone Lvl: 4.8 ug/mL — ABNORMAL LOW (ref 5.0–12.0)

## 2021-10-05 LAB — LAMOTRIGINE LEVEL: Lamotrigine Lvl: 12.2 ug/mL (ref 2.0–20.0)

## 2021-10-09 ENCOUNTER — Telehealth: Payer: Self-pay | Admitting: Pulmonary Disease

## 2021-10-09 NOTE — Telephone Encounter (Signed)
States that pt has been coughing since last Thursday. RA has advised for pt to be seen ASAP when this happens. Per TD: we are not to schedule any appointments for today. First opening for an acute visit is on 3/8. [name] asked if we there was any way we could see him sooner. Please advise.  ?

## 2021-10-09 NOTE — Telephone Encounter (Signed)
Called and spoke with Opal Sidles and patient.  Patient has been having a nonproductive cough, since Thursday 10/05/21. Opal Sidles requested patient ben seen in office to have cough evaluated.  Offered patient OV with Dr. Elsworth Soho at [place] office.  Patient schedule 10/10/21 at 1130 with Dr. Elsworth Soho in [place] office.  Opal Sidles and patient are aware of [place] office address. Nothing further at this time. ?

## 2021-10-10 ENCOUNTER — Encounter: Payer: Self-pay | Admitting: Cardiology

## 2021-10-10 ENCOUNTER — Encounter: Payer: Self-pay | Admitting: Pulmonary Disease

## 2021-10-10 ENCOUNTER — Other Ambulatory Visit: Payer: Self-pay

## 2021-10-10 ENCOUNTER — Ambulatory Visit (INDEPENDENT_AMBULATORY_CARE_PROVIDER_SITE_OTHER): Payer: Medicare (Managed Care) | Admitting: Pulmonary Disease

## 2021-10-10 ENCOUNTER — Ambulatory Visit (HOSPITAL_COMMUNITY)
Admission: RE | Admit: 2021-10-10 | Discharge: 2021-10-10 | Disposition: A | Discharge status: Home / Self Care | Payer: Medicare (Managed Care) | Source: Ambulatory Visit | Attending: Pulmonary Disease | Admitting: Pulmonary Disease

## 2021-10-10 VITALS — BP 128/64 | HR 96 | Temp 98.7°F | Ht 70.0 in | Wt 186.6 lb

## 2021-10-10 DIAGNOSIS — R059 Cough, unspecified: Secondary | ICD-10-CM | POA: Insufficient documentation

## 2021-10-10 DIAGNOSIS — J479 Bronchiectasis, uncomplicated: Secondary | ICD-10-CM | POA: Diagnosis not present

## 2021-10-10 DIAGNOSIS — G4733 Obstructive sleep apnea (adult) (pediatric): Secondary | ICD-10-CM

## 2021-10-10 NOTE — Assessment & Plan Note (Addendum)
Acute cough, does not seem to be bronchiectasis exacerbation.  Does not appear to be clearly viral or bacterial bronchitis. ?We will treat for bone exposure with Chlor-Trimeton, okay to use Delsym OTC for symptomatic relief. ?Since he is on CellCept, we will obtain chest x-ray to rule out infiltrate ?Addendum -chest x-ray reviewed and shows chronic left hemidiaphragm elevation, no evidence of pneumonia ?

## 2021-10-10 NOTE — Patient Instructions (Signed)
?  X OTC Delsym cough syrup ? ?X OTC chlortrimeton 4 mg at bedtime x 5 days ? ?CXR today ?

## 2021-10-10 NOTE — Assessment & Plan Note (Signed)
CPAP download shows good control of events. ?Nocturnal oxygen has been discontinued and he has not noticed any change in the somnolence and fatigue, overall he is very compliant and CPAP is only helped improve his daytime somnolence and fatigue ?

## 2021-10-10 NOTE — Progress Notes (Signed)
? ?  Subjective:  ? ? Patient ID: [name], male    DOB: [birth year], 76 y.o.   MRN: [id number] ? ?HPI ? ?76 yo for FU of obstructive sleep apnea And mild bronchiectasis .  ?  ?PMH - difficult to control hypertension ?03/2021 enterococcal UTI, C. difficile colitis ?-On CellCept for inflammatory eye condition ? ? ?Chief Complaint  ?Patient presents with  ? Acute Visit  ?  Has had a cough since Thursday when he went for a walk. Dry cough, and cpap helps cough.   ? ?Accompanied by wife [name] who corroborates history  ?cough started acutely after a walk on Thursday, no sick contacts, no URI symptoms, no fevers.  Remains nonproductive. ?Feels better when he uses CPAP and sleeps at night, cough does not wake him up. ? ?After we performed nocturnal oximetry in December, he has discontinued oxygen and feels okay, has good energy in the daytime, no somnolence.  Compliant with CPAP ?History of C. difficile colitis in August 2022 ? ?He has been started on CellCept by ophthalmology over the past few weeks ? ?Significant tests/ events reviewed ? ? ?07/2021 ONO on CPAP/room air shows minimal desaturation for 4 minutes total less than 88%. ? ?PSG 02/2002 - mild OSA with AHI 16/h, lowest satn of 70% >> CPAP 9 ?  ?10/30/2011 SPlit study showed O2 still required -esp during REM sleep  ?Increased CpAP to 12 cm , Keep 3 L O2 blended in  ?  ?07/2016 PFTs nml ? ?03/11/21 CTA chest did not show pulm embolism, showed bibasilar atelectasis and scarring with a small left pleural effusion, prominent mediastinal lymphadenopathy similar to 2016 ? ?Review of Systems ?neg for any significant sore throat, dysphagia, itching, sneezing, nasal congestion or excess/ purulent secretions, fever, chills, sweats, unintended wt loss, pleuritic or exertional cp, hempoptysis, orthopnea pnd or change in chronic leg swelling. Also denies presyncope, palpitations, heartburn, abdominal pain, nausea, vomiting, diarrhea or change in bowel or urinary habits,  dysuria,hematuria, rash, arthralgias, visual complaints, headache, numbness weakness or ataxia. ? ?   ?Objective:  ? Physical Exam ? ?Gen. Pleasant, well-nourished, in no distress ?ENT - no thrush, no pallor/icterus,no post nasal drip ?Neck: No JVD, no thyromegaly, no carotid bruits ?Lungs: no use of accessory muscles, no dullness to percussion, clear without rales or rhonchi  ?Cardiovascular: Rhythm regular, heart sounds  normal, no murmurs or gallops, no peripheral edema ?Musculoskeletal: No deformities, no cyanosis or clubbing  ? ? ? ?   ?Assessment & Plan:  ? ? ?

## 2021-10-11 ENCOUNTER — Ambulatory Visit: Payer: Medicare PPO | Admitting: Internal Medicine

## 2021-10-11 ENCOUNTER — Telehealth: Payer: Self-pay | Admitting: Pulmonary Disease

## 2021-10-11 NOTE — Progress Notes (Signed)
LMOM for patient to call office back. Call back information provided.

## 2021-10-11 NOTE — Telephone Encounter (Signed)
See results message. Closing encounter.  ? ?

## 2021-10-11 NOTE — Telephone Encounter (Signed)
From patient.

## 2021-10-20 ENCOUNTER — Encounter: Payer: Self-pay | Admitting: Pulmonary Disease

## 2021-10-25 ENCOUNTER — Telehealth: Payer: Self-pay | Admitting: Pulmonary Disease

## 2021-10-25 NOTE — Telephone Encounter (Signed)
Called and went over results with patient. Nothing further needed.  ?

## 2021-10-31 IMAGING — DX DG CHEST 2V
2 series · 2 of 2 positions shown · non-contrast
Comparison: 03/12/2021

CLINICAL DATA: Cough

EXAM:
CHEST - 2 VIEW

[chest ap]
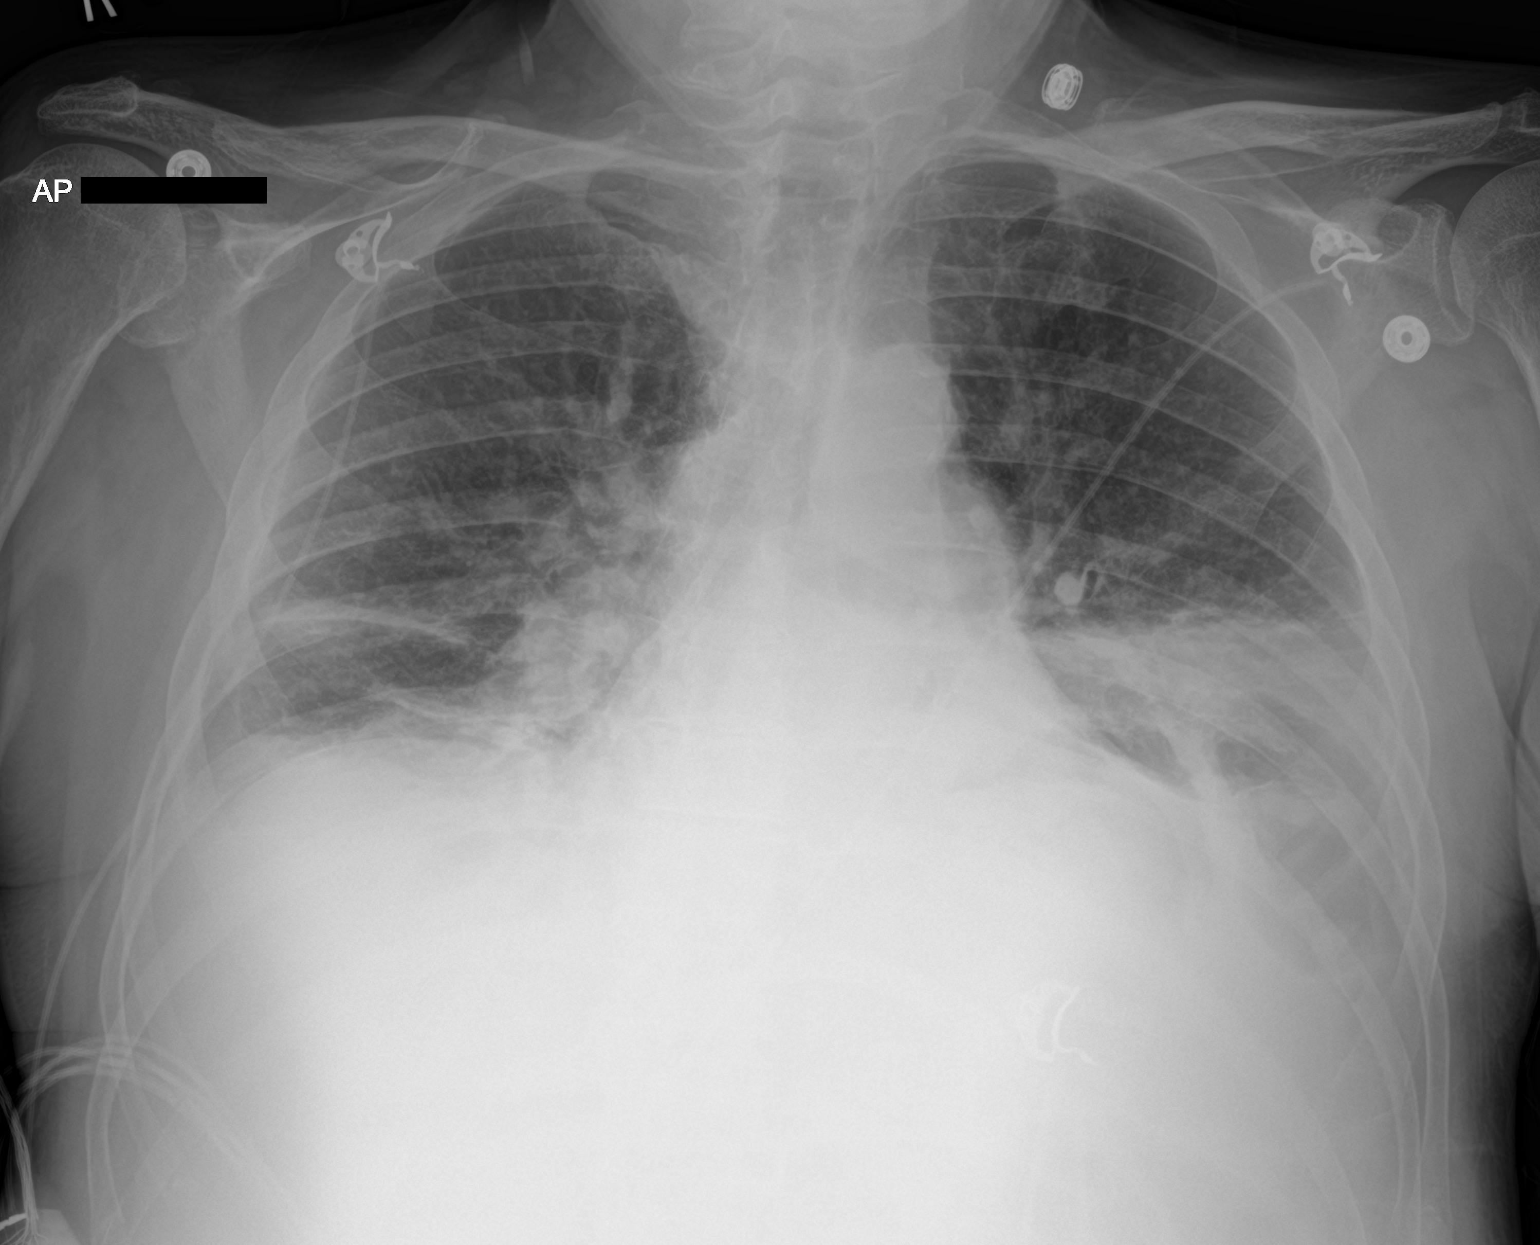

[chest lat]
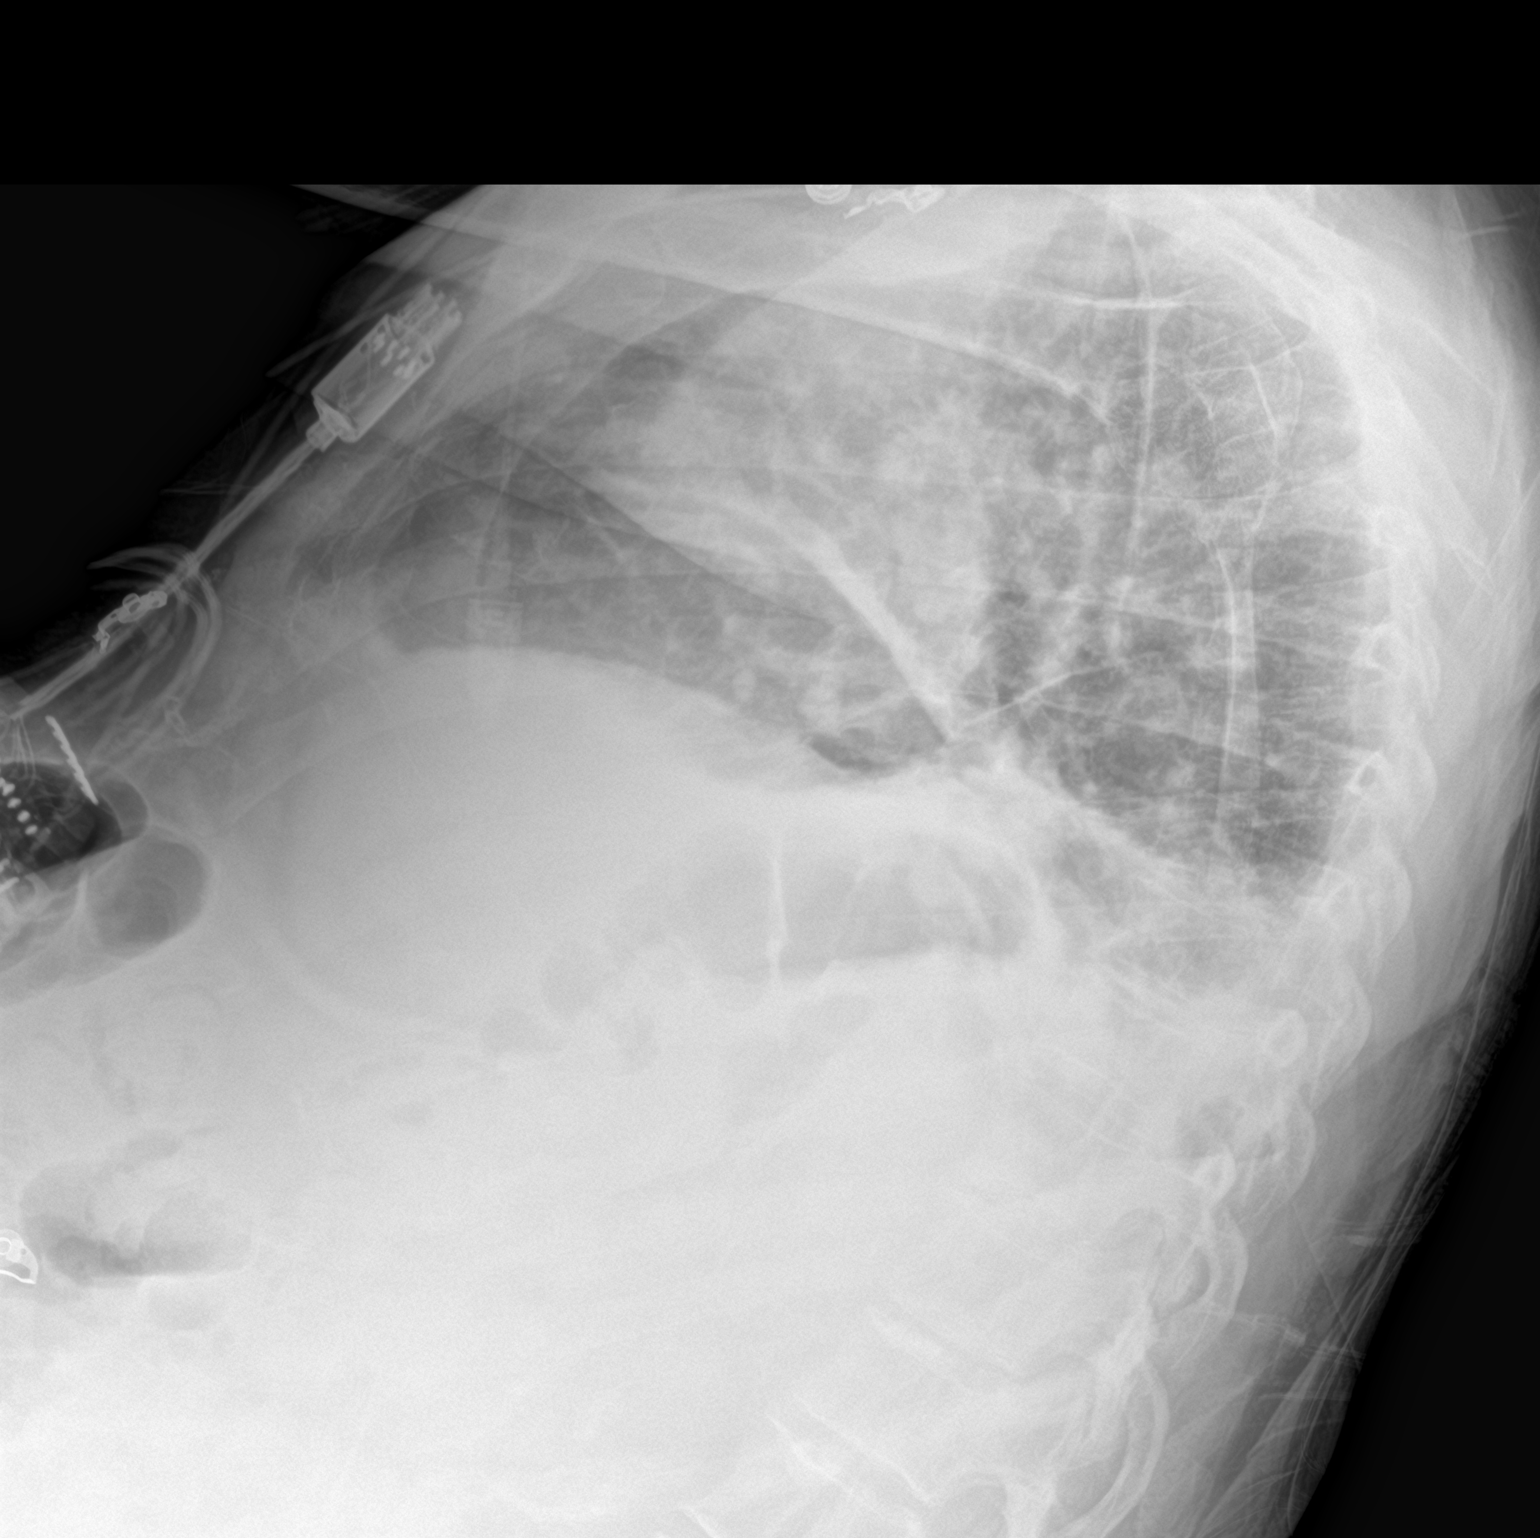

[2 of 2 positions shown; findings below may reference images not displayed]

FINDINGS: Low lung volumes. Persistent bibasilar opacities, left greater than
right. Trace pleural effusions. Similar cardiomediastinal contours.
IMPRESSION: Persistent bibasilar atelectasis/consolidation. Trace bilateral
pleural effusions.

## 2021-11-08 ENCOUNTER — Encounter: Payer: Self-pay | Admitting: Cardiology

## 2021-11-08 ENCOUNTER — Ambulatory Visit: Payer: Medicare (Managed Care) | Admitting: Cardiology

## 2021-11-08 VITALS — BP 151/78 | HR 80 | Temp 98.3°F | Resp 16 | Ht 70.0 in | Wt 188.0 lb

## 2021-11-08 DIAGNOSIS — I10 Essential (primary) hypertension: Secondary | ICD-10-CM

## 2021-11-08 DIAGNOSIS — I5032 Chronic diastolic (congestive) heart failure: Secondary | ICD-10-CM

## 2021-11-08 MED ORDER — SPIRONOLACTONE 25 MG PO TABS: 25.0000 mg | ORAL_TABLET | Freq: Every day | ORAL | 3 refills | Status: DC

## 2021-11-08 NOTE — Progress Notes (Signed)
? ?Follow up visit ? ?Subjective:  ? ?[name], male    DOB: 19-Dec-1945, 76 y.o.   MRN: [id number] ? ? ?HPI ? ? ?Chief Complaint  ?Patient presents with  ? Congestive Heart Failure  ? Hypertension  ? Follow-up  ?  3 month  ? ? ?76 y.o. Caucasian male with hypertension, h/o cancer, peripheral neuropathy, HFpEF ? ?Patient is doing well. Blood pressure elevated in the office today, but well controled at home. Avg BP 123/73 mmHg. Recent cough after starting a medication by his ophthalmologist, resolved after stopping it. He denies any dyspnea, leg edema symptoms.  ? ? ?Current Outpatient Medications:  ?  acetaminophen (TYLENOL) 325 MG tablet, Take 650 mg by mouth every 6 (six) hours as needed for mild pain, fever or headache., Disp: , Rfl:  ?  allopurinol (ZYLOPRIM) 100 MG tablet, Take 150 mg by mouth daily., Disp: , Rfl:  ?  Cholecalciferol (D3 2000) 50 MCG (2000 UT) CAPS, Take 2,000 Units by mouth daily., Disp: , Rfl:  ?  felodipine (PLENDIL) 10 MG 24 hr tablet, Take 10 mg by mouth daily., Disp: , Rfl:  ?  fexofenadine (ALLEGRA) 60 MG tablet, Take 60 mg by mouth as needed for allergies or rhinitis., Disp: , Rfl:  ?  Fiber CAPS, Take 1 capsule by mouth daily., Disp: , Rfl:  ?  furosemide (LASIX) 40 MG tablet, TAKE ONE TABLET BY MOUTH DAILY, Disp: 90 tablet, Rfl: 1 ?  ketorolac (ACULAR) 0.5 % ophthalmic solution, daily., Disp: , Rfl:  ?  ketorolac (ACULAR) 0.5 % ophthalmic solution, Place 1 drop into both eyes 4 times daily., Disp: , Rfl:  ?  lamoTRIgine (LAMICTAL) 150 MG tablet, Take 2 tablets (300 mg total) by mouth 2 (two) times daily., Disp: 360 tablet, Rfl: 3 ?  losartan (COZAAR) 50 MG tablet, Take 1 tablet (50 mg total) by mouth daily., Disp: 90 tablet, Rfl: 3 ?  Multiple Vitamin (MULTIVITAMIN) tablet, Take 1 tablet by mouth daily., Disp: , Rfl:  ?  mycophenolate (CELLCEPT) 500 MG tablet, Take 500 mg by mouth in the morning and at bedtime., Disp: , Rfl:  ?  ondansetron (ZOFRAN) 4 MG tablet, Take 1 tablet (4  mg total) by mouth every 6 (six) hours as needed for nausea., Disp: 20 tablet, Rfl: 0 ?  pantoprazole (PROTONIX) 40 MG tablet, Take 1 tablet (40 mg total) by mouth daily at 6 (six) AM., Disp: 60 tablet, Rfl: 0 ?  prednisoLONE acetate (PRED FORTE) 1 % ophthalmic suspension, Place 4 drops into both eyes daily., Disp: , Rfl:  ?  primidone (MYSOLINE) 50 MG tablet, TAKE ONE TABLET BY MOUTH EVERY MORNING AND TAKE ONE TABLET BY MOUTH EVERY NIGHT AT BEDTIME, Disp: 180 tablet, Rfl: 3 ?  rosuvastatin (CRESTOR) 20 MG tablet, Take 20 mg by mouth at bedtime., Disp: , Rfl:  ?  spironolactone (ALDACTONE) 25 MG tablet, Take 1 tablet (25 mg total) by mouth daily., Disp: 30 tablet, Rfl: 3 ?  zolpidem (AMBIEN) 10 MG tablet, Take 5-10 mg by mouth at bedtime as needed for sleep., Disp: , Rfl:  ? ?Cardiovascular & other pertient studies: ? ?EKG 11/08/2021: ?Sinus rhythm 75 bpm ?Nonspecific T-abnormality ? ?Echocardiogram 04/19/2021:  ?Study Quality: Technically difficult study.  ?Normal LV systolic function with visual EF 50-55%. Left ventricle cavity  ?is normal in size. Severe left ventricular hypertrophy. Normal global wall  ?motion. Doppler evidence of grade II (pseudonormal) diastolic dysfunction,  ?indeterminate LAP.  ?Left atrial cavity is mildly dilated.  ?  Trace aortic regurgitation.  ?Mild (Grade I) mitral regurgitation.  ?No evidence of pulmonary hypertension.  ?Compared to study 03/04/2020: LVEF was 60-65% now 50-55%, LAE was moderate  ?now mild, otherwise no significant change.  ? ?EKG 04/07/2021: ?Sinus tachycardia 102 bpm  ?Nonspecific T-abnormality ? ?CTA chest 03/11/2021: ?1. No evidence of pulmonary embolism. ?2. Small left pleural effusion and bibasilar atelectasis/scarring. ?  ?Aortic Atherosclerosis (ICD10-I70.0). ?  ?Echocardiogram w/strain imaging 03/04/2020: ? 1. Normal GLS -20.9. Left ventricular ejection fraction, by estimation,  ?is 60 to 65%. The left ventricle has normal function. The left ventricle  ?has no regional  wall motion abnormalities. There is mild left ventricular  ?hypertrophy. Left ventricular  diastolic parameters are consistent with Grade II diastolic dysfunction  ?(pseudonormalization). Elevated left ventricular end-diastolic pressure.  ?Normal GLS -20.9. ? 2. Right ventricular systolic function is normal. The right ventricular  ?size is normal.  ? 3. Left atrial size was moderately dilated.  ? 4. The mitral valve is normal in structure. Mild mitral valve  ?regurgitation. No evidence of mitral stenosis.  ? 5. The aortic valve is tricuspid. Aortic valve regurgitation is trivial.  ?Mild aortic valve sclerosis is present, with no evidence of aortic valve  ?stenosis.  ? 6. The inferior vena cava is normal in size with greater than 50%  ?respiratory variability, suggesting right atrial pressure of 3 mmHg.  ? ? ?Recent labs: ?08/23/2021: ?Glucose 90, BUN/Cr 27/1.22. EGFR 61. Na/K 137/4.5.  ? ?04/14/2021: ?Glucose 104, BUN/Cr 19/1.08. EGFR 71. Na/K 141/4.6.  ?BNP 55 ? ?03/20/2021: ?Glucose 101, BUN/Cr 12/1.16. EGFR >60. Na/K 135/4.7.  ?H/H 11/32. MCV 91. Platelets 186 ?D-Dimer 2.54 high ?Results for BERLIN, MOKRY (MRN [id number]) as of 04/06/2021 21:22 ? Ref. Range 03/13/2021 12:13 03/16/2021 04:47 03/17/2021 05:08  ?B Natriuretic Peptide Latest Ref Range: 0.0 - 100.0 pg/mL 400.0 (H) 348.1 (H) 257.0 (H)  ? ?11/08/2020: ?Glucose 68, BUN/Cr 22/1.12. EGFR 68. Na/K 142/4.8.  ? ?06/24/2019: ?Glucose 89, BUN/Cr 16/1.1. EGFR 69. Na/K 145/4.8. Rest of the CMP normal ?H/H 13/40. MCV 90. Platelets 143 ?Chol 130, TG 94, HDL 44, LDL 67 ? ? ?Review of Systems  ?Cardiovascular:  Negative for chest pain, dyspnea on exertion, leg swelling, palpitations and syncope.  ?Musculoskeletal:  Positive for back pain.  ? ?   ? ?Vitals:  ? 11/08/21 1317  ?BP: (!) 151/78  ?Pulse: 80  ?Resp: 16  ?Temp: 98.3 ?F (36.8 ?C)  ?SpO2: 96%  ? ? ?Body mass index is 26.98 kg/m?. [name] Weights  ? 11/08/21 1317  ?Weight: 188 lb (85.3 kg)  ? ? ? ?Objective:  ? Physical  Exam ?Vitals and nursing note reviewed.  ?Constitutional:   ?   General: He is not in acute distress. ?Neck:  ?   Vascular: No JVD.  ?Cardiovascular:  ?   Rate and Rhythm: Normal rate and regular rhythm.  ?   Heart sounds: Normal heart sounds. No murmur heard. ?Pulmonary:  ?   Effort: Pulmonary effort is normal.  ?   Breath sounds: Normal breath sounds. No wheezing or rales.  ?Musculoskeletal:  ?   Right lower leg: No edema.  ?   Left lower leg: No edema.  ? ?  ICD-10-CM   ?1. Chronic heart failure with preserved ejection fraction (HCC)  I50.32 EKG 12-Lead  ?  spironolactone (ALDACTONE) 25 MG tablet  ?  ?2. Essential hypertension  I10 spironolactone (ALDACTONE) 25 MG tablet  ?  ? ? ?   ?Assessment & Recommendations:  ? ?76  y.o. Caucasian male with hypertension, h/o cancer, peripheral neuropathy, HFpEF ? ?HFpEF: ?Normal EF, grade III diastolic function at baseline with normal strain. Low suspicion for infiltrative cardiomyopathy. 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  ?While is euvolumic and asymptomatic at baseline, I think he rapidly developed volume overload with IV fluids and antibiotics for pneumonia during his hospitalization min 03/2021. ?Back to baseline now. BNP normal. ?Continue losartan 50 mg.  ?Refilled spironolactone 25 mg daily, ? ?Hypertension: ?White coat hypertension, otherwise well controlled.  ? ?F/u in 6 months ? ?Nigel Mormon, MD ?[facility] ?Pager: [phone] ?Office: [phone] ?  ? ?

## 2021-11-09 ENCOUNTER — Ambulatory Visit: Payer: Medicare PPO | Admitting: Pulmonary Disease

## 2021-11-22 IMAGING — DX DG CHEST 2V
2 series · 2 of 2 positions shown · non-contrast
Comparison: Chest x-ray dated March 14, 2021

CLINICAL DATA: Follow-up CPAP

EXAM:
CHEST - 2 VIEW

[chest pa]
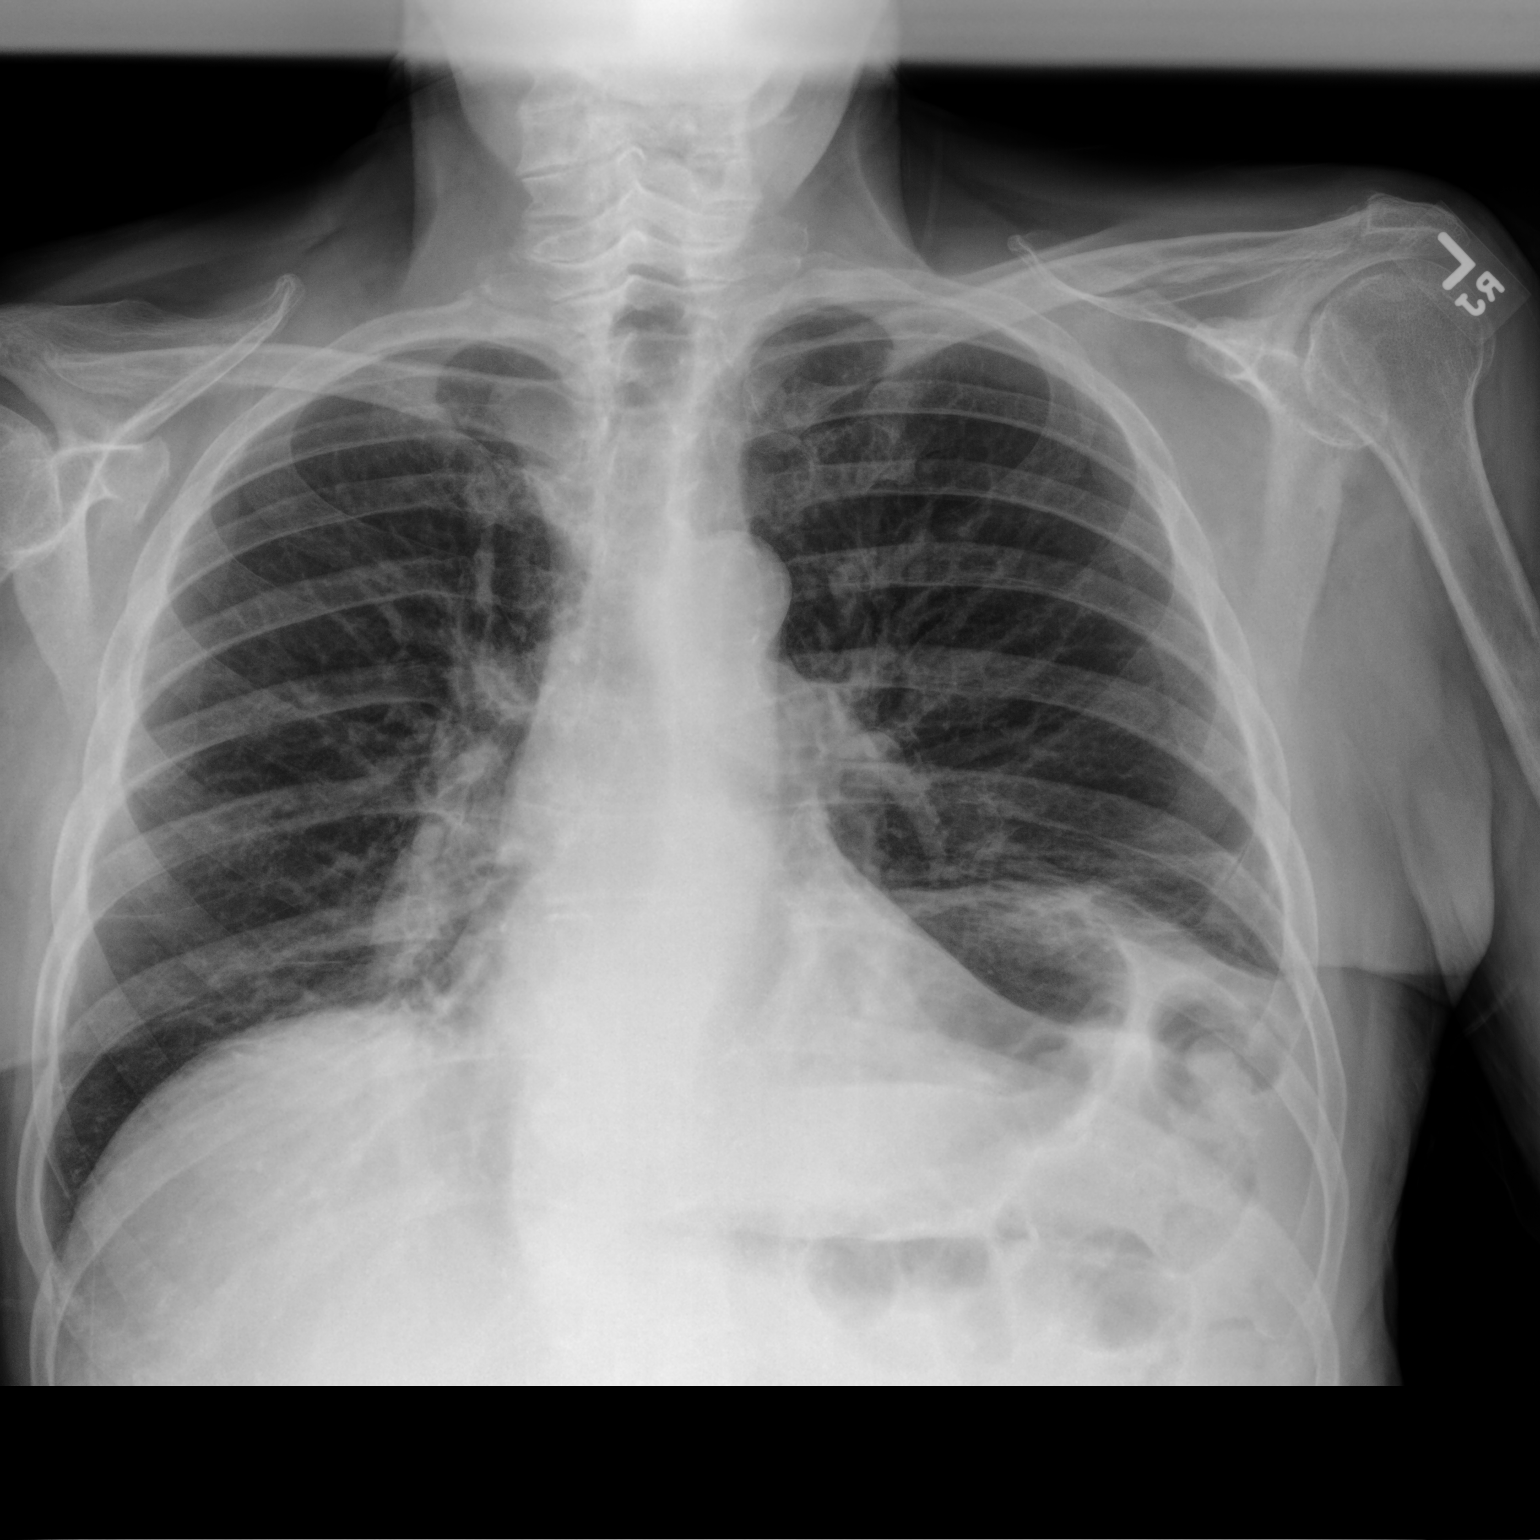

[chest lat]
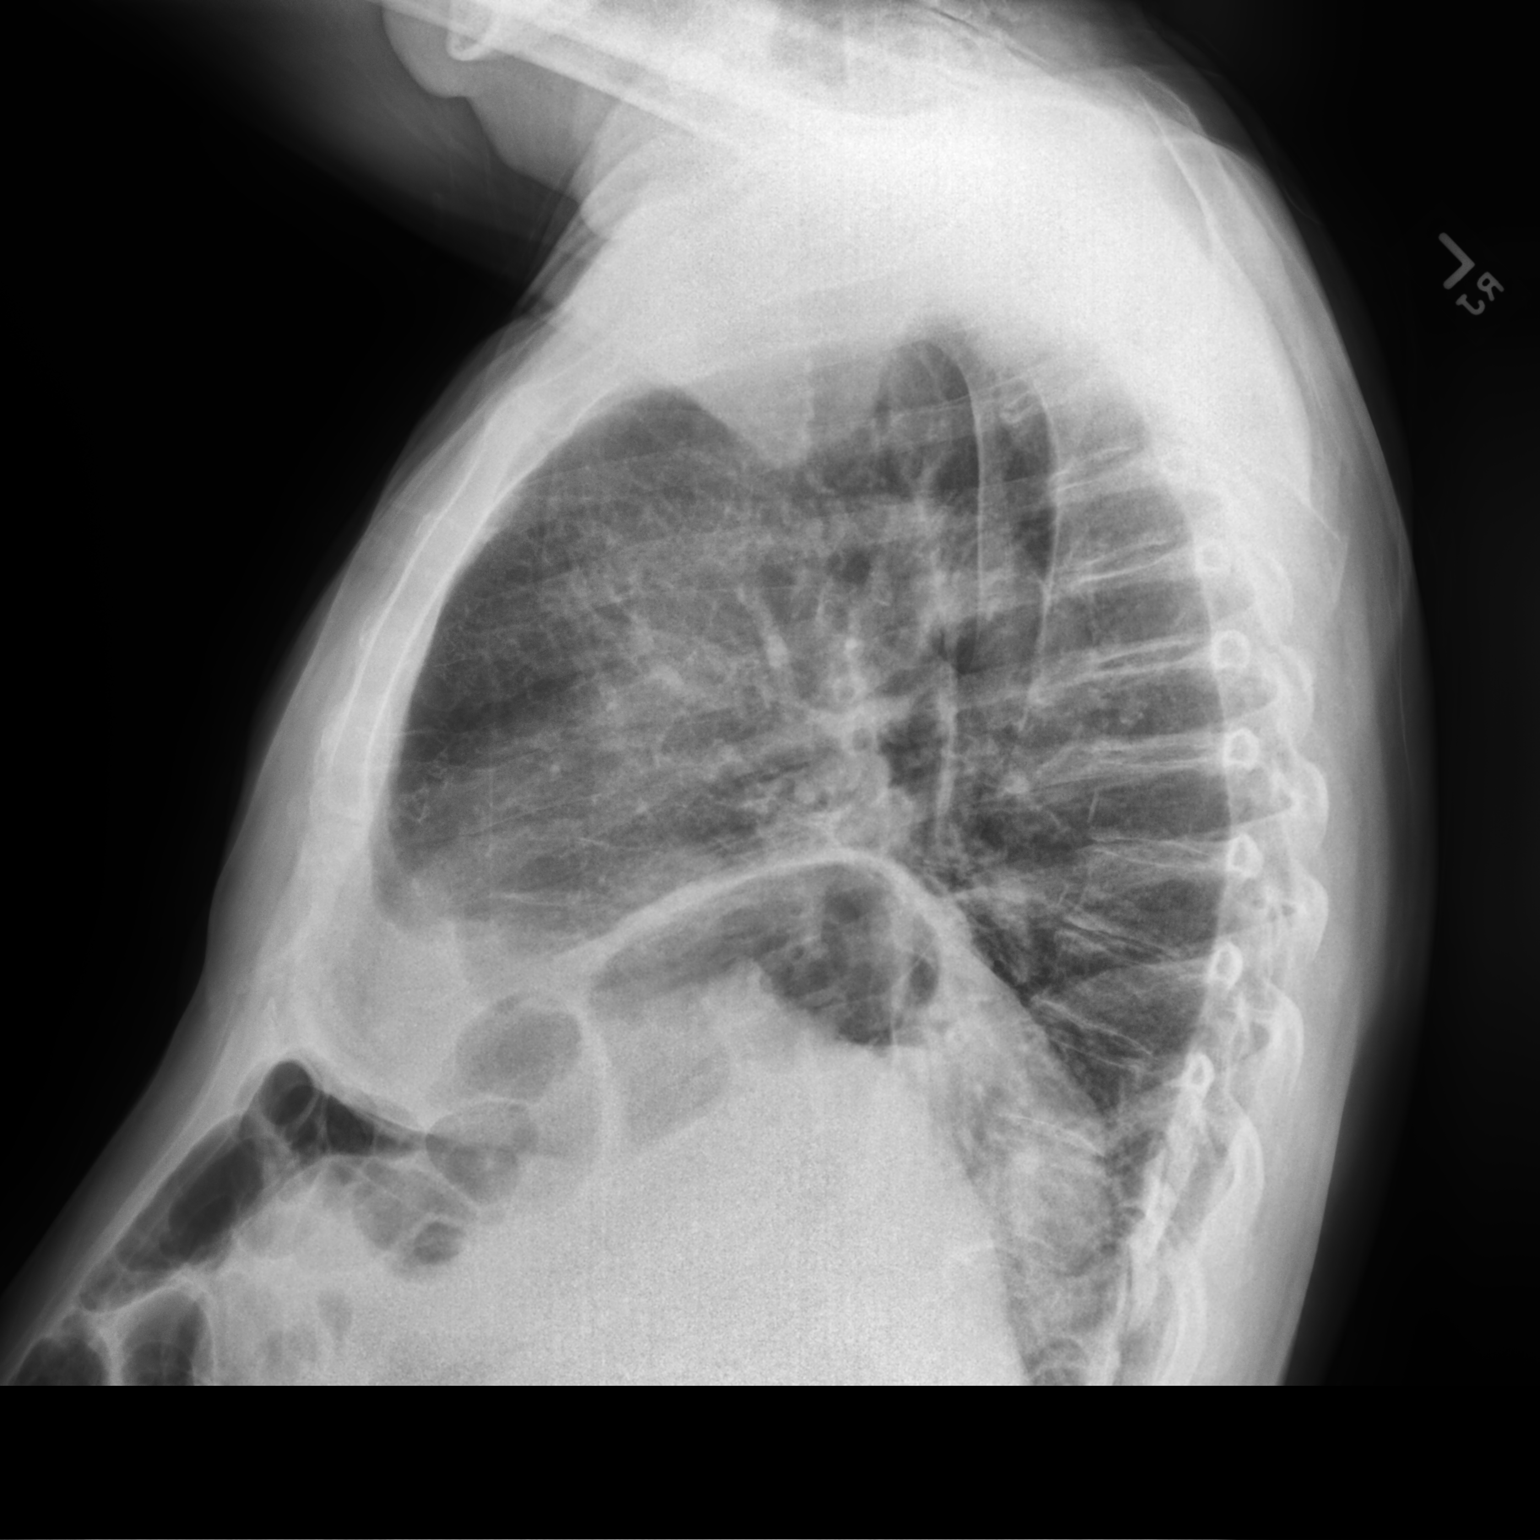

[2 of 2 positions shown; findings below may reference images not displayed]

FINDINGS: Cardiac and mediastinal contours are unchanged. Elevation of the
left hemidiaphragm. Decreased bibasilar opacities. No pleural
effusion or pneumothorax.
IMPRESSION: Decreased bibasilar atelectasis. Persistent elevation of the left
hemidiaphragm and left basilar atelectasis.

## 2022-01-13 ENCOUNTER — Other Ambulatory Visit: Payer: Self-pay | Admitting: Cardiology

## 2022-04-17 ENCOUNTER — Encounter: Payer: Self-pay | Admitting: Pulmonary Disease

## 2022-04-17 ENCOUNTER — Ambulatory Visit (INDEPENDENT_AMBULATORY_CARE_PROVIDER_SITE_OTHER): Payer: Medicare (Managed Care) | Admitting: Pulmonary Disease

## 2022-04-17 VITALS — BP 130/80 | HR 74 | Ht 70.0 in | Wt 199.2 lb

## 2022-04-17 DIAGNOSIS — Z23 Encounter for immunization: Secondary | ICD-10-CM | POA: Diagnosis not present

## 2022-04-17 DIAGNOSIS — G4733 Obstructive sleep apnea (adult) (pediatric): Secondary | ICD-10-CM

## 2022-04-17 DIAGNOSIS — J479 Bronchiectasis, uncomplicated: Secondary | ICD-10-CM | POA: Diagnosis not present

## 2022-04-17 NOTE — Patient Instructions (Signed)
X flu shot  Okay to get RSV vaccine

## 2022-04-17 NOTE — Progress Notes (Signed)
   Subjective:    Patient ID: [name], male    DOB: 09-16-1945, 76 y.o.   MRN: [id number]  HPI  76 yo for FU of obstructive sleep apnea and mild bronchiectasis .  -He was on oxygen until 07/2021 when we discontinued   PMH - difficult to control hypertension 03/2021 enterococcal UTI, C. 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  This is now resolved.  He attributes it to CellCept.  He is not on CellCept anymore his eyes are doing okay. Blood pressure appears controlled on 2 medications. CPAP is working well, no problems with mask or pressure. We discontinued oxygen in December and he does not report daytime tiredness or fatigue  Significant tests/ events reviewed     07/2021 ONO on CPAP/room air shows minimal desaturation for 4 minutes total less than 88%.   PSG 02/2002 - mild OSA with AHI 16/h, lowest satn of 70% >> CPAP 9   10/30/2011 SPlit study showed O2 still required -esp during REM sleep  Increased CpAP to 12 cm , Keep 3 L O2 blended in    07/2016 PFTs nml   03/11/21 CTA chest did not show pulm embolism, showed bibasilar atelectasis and scarring with a small left pleural effusion, prominent mediastinal lymphadenopathy similar to 2016    Review of Systems neg for any significant sore throat, dysphagia, itching, sneezing, nasal congestion or excess/ purulent secretions, fever, chills, sweats, unintended wt loss, pleuritic or exertional cp, hempoptysis, orthopnea pnd or change in chronic leg swelling. Also denies presyncope, palpitations, heartburn, abdominal pain, nausea, vomiting, diarrhea or change in bowel or urinary habits, dysuria,hematuria, rash, arthralgias, visual complaints, headache, numbness weakness or ataxia.     Objective:   Physical Exam  Gen. Pleasant, well-nourished, in no distress ENT - no thrush, no pallor/icterus,no post  nasal drip Neck: No JVD, no thyromegaly, no carotid bruits Lungs: no use of accessory muscles, no dullness to percussion, clear without rales or rhonchi  Cardiovascular: Rhythm regular, heart sounds  normal, no murmurs or gallops, no peripheral edema Musculoskeletal: No deformities, no cyanosis or clubbing        Assessment & Plan:

## 2022-04-17 NOTE — Assessment & Plan Note (Signed)
Appears mild without airway obstruction. Does not need maintenance therapy

## 2022-04-17 NOTE — Assessment & Plan Note (Signed)
Previous CPAP download was reviewed which has shown good compliance.  He reports good compliance currently and denies any problems with mask or pressure.  He is maintained on 12 cm of CPAP  Weight loss encouraged, compliance with goal of at least 4-6 hrs every night is the expectation. Advised against medications with sedative side effects Cautioned against driving when sleepy - understanding that sleepiness will vary on a day to day basis

## 2022-04-20 ENCOUNTER — Other Ambulatory Visit: Payer: Self-pay | Admitting: Cardiology

## 2022-04-20 DIAGNOSIS — I10 Essential (primary) hypertension: Secondary | ICD-10-CM

## 2022-04-22 ENCOUNTER — Other Ambulatory Visit: Payer: Self-pay | Admitting: Cardiology

## 2022-05-10 ENCOUNTER — Encounter: Payer: Self-pay | Admitting: Cardiology

## 2022-05-10 ENCOUNTER — Ambulatory Visit: Payer: Medicare (Managed Care) | Admitting: Cardiology

## 2022-05-10 VITALS — BP 148/82 | HR 83 | Temp 98.0°F | Resp 16 | Ht 70.0 in | Wt 197.6 lb

## 2022-05-10 DIAGNOSIS — I5032 Chronic diastolic (congestive) heart failure: Secondary | ICD-10-CM

## 2022-05-10 NOTE — Progress Notes (Addendum)
Follow up visit  Subjective:   [name], male    DOB: 06-15-1946, 76 y.o.   MRN: [id number]   HPI   Chief Complaint  Patient presents with   Chronic heart failure with preserved ejection fraction    Follow-up    76 y.o. Caucasian male with hypertension, h/o cancer, peripheral neuropathy, HFpEF  Patient is doing well. Blood pressure elevated in the office today, but well controled at home, as reviewed on remote patient monitoring data.. He denies chest pain, shortness of breath, palpitations, leg edema, orthopnea, PND, TIA/syncope.    Current Outpatient Medications:    acetaminophen (TYLENOL) 325 MG tablet, Take 650 mg by mouth every 6 (six) hours as needed for mild pain, fever or headache., Disp: , Rfl:    allopurinol (ZYLOPRIM) 100 MG tablet, Take 150 mg by mouth daily., Disp: , Rfl:    Cholecalciferol (D3 2000) 50 MCG (2000 UT) CAPS, Take 2,000 Units by mouth daily., Disp: , Rfl:    ezetimibe (ZETIA) 10 MG tablet, Take 10 mg by mouth daily., Disp: , Rfl:    felodipine (PLENDIL) 10 MG 24 hr tablet, Take 10 mg by mouth daily., Disp: , Rfl:    fexofenadine (ALLEGRA) 60 MG tablet, Take 60 mg by mouth as needed for allergies or rhinitis., Disp: , Rfl:    Fiber CAPS, Take 1 capsule by mouth daily., Disp: , Rfl:    fluorometholone (FML) 0.1 % ophthalmic suspension, SMARTSIG:In Eye(s), Disp: , Rfl:    furosemide (LASIX) 40 MG tablet, TAKE ONE TABLET BY MOUTH ONE TIME DAILY, Disp: 90 tablet, Rfl: 0   ketorolac (ACULAR) 0.5 % ophthalmic solution, daily., Disp: , Rfl:    ketorolac (ACULAR) 0.5 % ophthalmic solution, Place 1 drop into both eyes 4 times daily., Disp: , Rfl:    ketorolac (ACULAR) 0.5 % ophthalmic solution, Place 1 drop into both eyes 4 times daily., Disp: , Rfl:    lamoTRIgine (LAMICTAL) 150 MG tablet, Take 2 tablets (300 mg total) by mouth 2 (two) times daily., Disp: 360 tablet, Rfl: 3   losartan (COZAAR) 50 MG tablet, TAKE ONE TABLET BY MOUTH ONE TIME DAILY, Disp:  90 tablet, Rfl: 1   Multiple Vitamin (MULTIVITAMIN) tablet, Take 1 tablet by mouth daily., Disp: , Rfl:    mycophenolate (CELLCEPT) 500 MG tablet, Take 500 mg by mouth in the morning and at bedtime., Disp: , Rfl:    ondansetron (ZOFRAN) 4 MG tablet, Take 1 tablet (4 mg total) by mouth every 6 (six) hours as needed for nausea., Disp: 20 tablet, Rfl: 0   pantoprazole (PROTONIX) 40 MG tablet, Take 1 tablet (40 mg total) by mouth daily at 6 (six) AM., Disp: 60 tablet, Rfl: 0   prednisoLONE acetate (PRED FORTE) 1 % ophthalmic suspension, Place 4 drops into both eyes daily. (Patient not taking: Reported on 04/17/2022), Disp: , Rfl:    primidone (MYSOLINE) 50 MG tablet, TAKE ONE TABLET BY MOUTH EVERY MORNING AND TAKE ONE TABLET BY MOUTH EVERY NIGHT AT BEDTIME, Disp: 180 tablet, Rfl: 3   Probiotic CHEW, See admin instructions., Disp: , Rfl:    rosuvastatin (CRESTOR) 20 MG tablet, Take 20 mg by mouth at bedtime., Disp: , Rfl:    spironolactone (ALDACTONE) 25 MG tablet, Take 1 tablet (25 mg total) by mouth daily., Disp: 90 tablet, Rfl: 3   zolpidem (AMBIEN) 10 MG tablet, Take 5-10 mg by mouth at bedtime as needed for sleep., Disp: , Rfl:   Cardiovascular & other pertient  studies:  EKG 05/10/2022: Sinus rhythm 77 bpm Nonspecific T-abnormality  Echocardiogram 04/19/2021:  Study Quality: Technically difficult study.  Normal LV systolic function with visual EF 50-55%. Left ventricle cavity  is normal in size. Severe left ventricular hypertrophy. Normal global wall  motion. Doppler evidence of grade II (pseudonormal) diastolic dysfunction,  indeterminate LAP.  Left atrial cavity is mildly dilated.  Trace aortic regurgitation.  Mild (Grade I) mitral regurgitation.  No evidence of pulmonary hypertension.  Compared to study 03/04/2020: LVEF was 60-65% now 50-55%, LAE was moderate  now mild, otherwise no significant change.   CTA chest 03/11/2021: 1. No evidence of pulmonary embolism. 2. Small left pleural  effusion and bibasilar atelectasis/scarring.   Aortic Atherosclerosis (ICD10-I70.0).    Recent labs: 01/17/2022: Chol 134, TG 166, HDL 49, LDL 57  08/23/2021: Glucose 90, BUN/Cr 27/1.22. EGFR 61. Na/K 137/4.5.    Review of Systems  Cardiovascular:  Negative for chest pain, dyspnea on exertion, leg swelling, palpitations and syncope.  Musculoskeletal:  Negative for back pain.        Vitals:   05/10/22 1302 05/10/22 1309  BP: (!) 150/78 (!) 148/82  Pulse: 79 83  Resp: 16   Temp: 98 F (36.7 C)   SpO2: 95%     Body mass index is 28.35 kg/m. Filed Weights   05/10/22 1302  Weight: 197 lb 9.6 oz (89.6 kg)     Objective:   Physical Exam Vitals and nursing note reviewed.  Constitutional:      General: He is not in acute distress. Neck:     Vascular: No JVD.  Cardiovascular:     Rate and Rhythm: Normal rate and regular rhythm.     Heart sounds: Normal heart sounds. No murmur heard. Pulmonary:     Effort: Pulmonary effort is normal.     Breath sounds: Normal breath sounds. No wheezing or rales.  Musculoskeletal:     Right lower leg: No edema.     Left lower leg: No edema.      ICD-10-CM   1. Chronic heart failure with preserved ejection fraction (HCC)  I50.32 EKG 12-Lead         Assessment & Recommendations:   76 y.o. Caucasian male with hypertension, h/o cancer, peripheral neuropathy, HFpEF  HFpEF: Normal EF, grade III diastolic function at baseline with normal strain. Low suspicion for infiltrative cardiomyopathy. 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  Continue losartan 50 mg, spironolactone 25 mg daily, In future, should he have recurrent symptoms of heart failure, will add SGLT2i such as Jardiance.  Hypertension: White coat hypertension, otherwise well controlled.   Mixed hyperlipidemia: Well controlled. LDL 57  F/u in 1 year   Nigel Mormon, MD Pager: [phone] Office: [phone]

## 2022-08-11 DIAGNOSIS — I10 Essential (primary) hypertension: Secondary | ICD-10-CM | POA: Diagnosis not present

## 2022-09-17 DIAGNOSIS — M19042 Primary osteoarthritis, left hand: Secondary | ICD-10-CM | POA: Diagnosis not present

## 2022-09-17 DIAGNOSIS — M19041 Primary osteoarthritis, right hand: Secondary | ICD-10-CM | POA: Diagnosis not present

## 2022-09-24 DIAGNOSIS — M25511 Pain in right shoulder: Secondary | ICD-10-CM | POA: Diagnosis not present

## 2022-09-24 DIAGNOSIS — M7541 Impingement syndrome of right shoulder: Secondary | ICD-10-CM | POA: Diagnosis not present

## 2022-09-24 DIAGNOSIS — M67813 Other specified disorders of tendon, right shoulder: Secondary | ICD-10-CM | POA: Diagnosis not present

## 2022-09-27 DIAGNOSIS — H3581 Retinal edema: Secondary | ICD-10-CM | POA: Diagnosis not present

## 2022-09-27 DIAGNOSIS — Z881 Allergy status to other antibiotic agents status: Secondary | ICD-10-CM | POA: Diagnosis not present

## 2022-09-27 DIAGNOSIS — Z880 Allergy status to penicillin: Secondary | ICD-10-CM | POA: Diagnosis not present

## 2022-09-27 DIAGNOSIS — H02833 Dermatochalasis of right eye, unspecified eyelid: Secondary | ICD-10-CM | POA: Diagnosis not present

## 2022-09-27 DIAGNOSIS — H02836 Dermatochalasis of left eye, unspecified eyelid: Secondary | ICD-10-CM | POA: Diagnosis not present

## 2022-09-27 DIAGNOSIS — Z882 Allergy status to sulfonamides status: Secondary | ICD-10-CM | POA: Diagnosis not present

## 2022-09-27 DIAGNOSIS — Z885 Allergy status to narcotic agent status: Secondary | ICD-10-CM | POA: Diagnosis not present

## 2022-09-27 DIAGNOSIS — Z947 Corneal transplant status: Secondary | ICD-10-CM | POA: Diagnosis not present

## 2022-09-27 DIAGNOSIS — Z961 Presence of intraocular lens: Secondary | ICD-10-CM | POA: Diagnosis not present

## 2022-09-27 DIAGNOSIS — H30033 Focal chorioretinal inflammation, peripheral, bilateral: Secondary | ICD-10-CM | POA: Diagnosis not present

## 2022-10-02 DIAGNOSIS — M7541 Impingement syndrome of right shoulder: Secondary | ICD-10-CM | POA: Diagnosis not present

## 2022-10-02 NOTE — Progress Notes (Unsigned)
No chief complaint on file.    HISTORY OF PRESENT ILLNESS:  10/02/22 ALL:  [name] returns for follow up for dysesthesias and tremor. He continues lamotrigine 354m and primidone 556mBID.   He continues close follow up with cardiology for chronic heart failure.   10/03/2021 ALL: [name] a [age].o. male here today for follow up for dysesthesias and tremor. He feels that he is doing well.   He continues lamotrigine 30034mID. Dysesthesias are stable. He has some pain in both legs and feet but feels it is manageable. No limitations of gait or functioning. He is sleeping well.   He was previously taking atenolol for ET. Cardiology changed med to labetalol for BP management. He was restarted on primidone 21m57mD (previously not tolerated). He reports tremor is better managed on primidone. He is tolerating well. He had some dizziness for a few days after starting but feels symptoms have resolved. Cardiology has stopped labetalol.    HISTORY (copied from Dr SateGarth Bignessvious note)  [name] 74 y65. 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   Update 09/30/20: He feels his polyneuropathy is stable.    Lamotrigine helps the dysesthesias.    Neurontin and a tricyclic antidepressant did not help the pain. Blood work including B12, cryoglobulins and SPEP/IEF had been essentially normal in the past, last tested in 2013. Nerve conduction study in the past was mildly abnormal but more recent one showed good functioning of the large fibers.  He sometimes notes dry eyes.       Tremors are slightly worse.    He notes it left > right hand.   He is right handed and this does not bother him much.    He is on atenolol 75 mg po bid.   Primidone was not well tolerated.    We went over options.   Tremor is not bad enough to try a benzo or to refer for DBS.       His gait is the same with a mild reduced balance..   He has had no further episode of presyncope.  He  tries to stay active and walks some daily.    He tries to walk 1/4 mile a couple tmes a day but spine hurts more with longer distance.     He is on CPAP and tolerating well.   REVIEW OF SYSTEMS: Out of a complete 14 system review of symptoms, the patient complains only of the following symptoms, tremor, neuropathy and all other reviewed systems are negative.   ALLERGIES: Allergies  Allergen Reactions   Doxycycline Hives and Itching    Hives and itching   Penicillins Swelling    Swelling, rash   Azithromycin Other (See Comments)    Other reaction(s): abdominal pain   Codeine Other (See Comments)     nausea   Sulfa Antibiotics Rash     HOME MEDICATIONS: Outpatient Medications Prior to Visit  Medication Sig Dispense Refill   acetaminophen (TYLENOL) 325 MG tablet Take 650 mg by mouth every 6 (six) hours as needed for mild pain, fever or headache.     allopurinol (ZYLOPRIM) 100 MG tablet Take 150 mg by mouth daily.     Cholecalciferol (D3 2000) 50 MCG (2000 UT) CAPS Take 2,000 Units by mouth daily.     ezetimibe (ZETIA) 10 MG tablet Take 10 mg by mouth daily.     felodipine (PLENDIL) 10 MG 24 hr tablet Take 10 mg by  mouth daily.     fexofenadine (ALLEGRA) 60 MG tablet Take 60 mg by mouth as needed for allergies or rhinitis.     Fiber CAPS Take 1 capsule by mouth daily.     fluorometholone (FML) 0.1 % ophthalmic suspension Place 1 drop into both eyes 3 (three) times daily.     furosemide (LASIX) 40 MG tablet TAKE ONE TABLET BY MOUTH ONE TIME DAILY 90 tablet 0   ketorolac (ACULAR) 0.5 % ophthalmic solution daily.     lamoTRIgine (LAMICTAL) 150 MG tablet Take 2 tablets (300 mg total) by mouth 2 (two) times daily. 360 tablet 3   losartan (COZAAR) 50 MG tablet TAKE ONE TABLET BY MOUTH ONE TIME DAILY 90 tablet 1   meloxicam (MOBIC) 15 MG tablet Take 15 mg by mouth daily.     Multiple Vitamin (MULTIVITAMIN) tablet Take 1 tablet by mouth daily.     ondansetron (ZOFRAN) 4 MG tablet Take 1  tablet (4 mg total) by mouth every 6 (six) hours as needed for nausea. 20 tablet 0   pantoprazole (PROTONIX) 40 MG tablet Take 1 tablet (40 mg total) by mouth daily at 6 (six) AM. 60 tablet 0   primidone (MYSOLINE) 50 MG tablet TAKE ONE TABLET BY MOUTH EVERY MORNING AND TAKE ONE TABLET BY MOUTH EVERY NIGHT AT BEDTIME 180 tablet 3   Probiotic CHEW See admin instructions.     rosuvastatin (CRESTOR) 20 MG tablet Take 20 mg by mouth at bedtime.     spironolactone (ALDACTONE) 25 MG tablet Take 1 tablet (25 mg total) by mouth daily. 90 tablet 3   zolpidem (AMBIEN) 10 MG tablet Take 5-10 mg by mouth at bedtime as needed for sleep.     No facility-administered medications prior to visit.     PAST MEDICAL HISTORY: Past Medical History:  Diagnosis Date   Cancer ([place])    Elevated diaphragm    Gout    History of prostate cancer Feb 1993   Hyperlipidemia    Hypertension    Hypoxemia    Normocytic anemia    OSA (obstructive sleep apnea)    Peripheral neuropathy    Pneumonia    Vision abnormalities      PAST SURGICAL HISTORY: Past Surgical History:  Procedure Laterality Date   APPENDECTOMY     PROSTATECTOMY     SHOULDER SURGERY     bone spur removed from right     FAMILY HISTORY: Family History  Problem Relation Age of Onset   Emphysema Father    Congestive Heart Failure Mother    Emphysema Brother    Asthma Daughter      SOCIAL HISTORY: Social History   Socioeconomic History   Marital status: Married    Spouse name: Not on file   Number of children: 2   Years of education: Not on file   Highest education level: Not on file  Occupational History   Occupation: Retired    Comment: Company secretary  Tobacco Use   Smoking status: Never   Smokeless tobacco: Never  Vaping Use   Vaping Use: Never used  Substance and Sexual Activity   Alcohol use: Not Currently   Drug use: No   Sexual activity: Not Currently  Other Topics Concern   Not on file  Social History Narrative    Not on file   Social Determinants of Health   Financial Resource Strain: Not on file  Food Insecurity: Not on file  Transportation Needs: Not on file  Physical Activity:  Not on file  Stress: Not on file  Social Connections: Not on file  Intimate Partner Violence: Not on file     PHYSICAL EXAM  There were no vitals filed for this visit.  There is no height or weight on file to calculate BMI.  Generalized: Well developed, in no acute distress  Cardiology: normal rate and rhythm, no murmur auscultated  Respiratory: clear to auscultation bilaterally    Neurological examination  Mentation: Alert oriented to time, place, history taking. Follows all commands speech and language fluent Cranial nerve II-XII: Pupils were equal round reactive to light. Extraocular movements were full, visual field were full on confrontational test. Facial sensation and strength were normal. Head turning and shoulder shrug  were normal and symmetric. Motor: The motor testing reveals 5 over 5 strength of all 4 extremities. Good symmetric motor tone is noted throughout. Mild action tremor noted of left hand  Sensory: Sensory testing is intact to soft touch on all 4 extremities. No evidence of extinction is noted.  Coordination: Cerebellar testing reveals good finger-nose-finger and heel-to-shin bilaterally.  Gait and station: Gait is normal.  Reflexes: Deep tendon reflexes are symmetric and normal bilaterally.    DIAGNOSTIC DATA (LABS, IMAGING, TESTING) - I reviewed patient records, labs, notes, testing and imaging myself where available.  Lab Results  Component Value Date   WBC 10.5 03/15/2021   HGB 11.0 (L) 03/15/2021   HCT 32.0 (L) 03/15/2021   MCV 91.2 03/15/2021   PLT 186 03/15/2021      Component Value Date/Time   NA 137 08/23/2021 1417   K 4.5 08/23/2021 1417   CL 96 08/23/2021 1417   CO2 27 08/23/2021 1417   GLUCOSE 90 08/23/2021 1417   GLUCOSE 101 (H) 03/20/2021 0408   BUN 27  08/23/2021 1417   CREATININE 1.22 08/23/2021 1417   CALCIUM 9.6 08/23/2021 1417   PROT 6.3 (L) 03/11/2021 0537   PROT 6.8 10/03/2020 1335   ALBUMIN 3.0 (L) 03/13/2021 0413   AST 18 03/11/2021 0537   ALT 13 03/11/2021 0537   ALKPHOS 87 03/11/2021 0537   BILITOT 0.9 03/11/2021 0537   GFRNONAA >60 03/20/2021 0408   GFRAA 67 04/13/2020 1104   Lab Results  Component Value Date   CHOL 126 01/20/2011   HDL 51 01/20/2011   LDLCALC 63 01/20/2011   TRIG 60 01/20/2011   CHOLHDL 2.5 01/20/2011   No results found for: "HGBA1C" Lab Results  Component Value Date   VITAMINB12 683 12/14/2014   Lab Results  Component Value Date   TSH 2.255 02/08/2011        No data to display               No data to display           ASSESSMENT AND PLAN  77 y.o. year old male  has a past medical history of Cancer ([place]), Elevated diaphragm, Gout, History of prostate cancer (Feb 1993), Hyperlipidemia, Hypertension, Hypoxemia, Normocytic anemia, OSA (obstructive sleep apnea), Peripheral neuropathy, Pneumonia, and Vision abnormalities. here with    No diagnosis found.  [place] is doing well, today. We will continue lamotrigine 331m and primidone 575mtwice daily. We will update labs. He will continues healthy lifestyle habits. Follow up in 1 year.   No orders of the defined types were placed in this encounter.    No orders of the defined types were placed in this encounter.     AmDebbora PrestoMSN, FNP-C 10/02/2022, 3:29 PM  [facility] [address], [address] [place], [place] [address] [phone] 

## 2022-10-02 NOTE — Patient Instructions (Signed)
Below is our plan:  We will continue lamotrigine 300mg  twice daily. We will increase primidone to 75mg  twice daily. We will check labs, today. We can consider referral to [facility] for consult to discuss procedural based options if you wish.   Please make sure you are staying well hydrated. I recommend 50-60 ounces daily. Well balanced diet and regular exercise encouraged. Consistent sleep schedule with 6-8 hours recommended.   Please continue follow up with care team as directed.   Follow up with me in 1 year, sooner if needed   You may receive a survey regarding today's visit. I encourage you to leave honest feed back as I do use this information to improve patient care. Thank you for seeing me today!

## 2022-10-03 ENCOUNTER — Ambulatory Visit (INDEPENDENT_AMBULATORY_CARE_PROVIDER_SITE_OTHER): Payer: Medicare (Managed Care) | Admitting: Family Medicine

## 2022-10-03 ENCOUNTER — Encounter: Payer: Self-pay | Admitting: Family Medicine

## 2022-10-03 VITALS — BP 141/95 | HR 87 | Ht 70.0 in | Wt 194.5 lb

## 2022-10-03 DIAGNOSIS — R208 Other disturbances of skin sensation: Secondary | ICD-10-CM

## 2022-10-03 DIAGNOSIS — G629 Polyneuropathy, unspecified: Secondary | ICD-10-CM | POA: Diagnosis not present

## 2022-10-03 DIAGNOSIS — G250 Essential tremor: Secondary | ICD-10-CM | POA: Diagnosis not present

## 2022-10-03 MED ORDER — LAMOTRIGINE 150 MG PO TABS: 300.0000 mg | ORAL_TABLET | Freq: Two times a day (BID) | ORAL | 3 refills | Status: DC

## 2022-10-03 MED ORDER — PRIMIDONE 50 MG PO TABS: 75.0000 mg | ORAL_TABLET | Freq: Two times a day (BID) | ORAL | 3 refills | Status: DC

## 2022-10-06 LAB — PRIMIDONE, SERUM
Phenobarbital, Serum: 5 ug/mL — ABNORMAL LOW (ref 15–40)
Primidone Lvl: 5.3 ug/mL (ref 5.0–12.0)

## 2022-10-06 LAB — LAMOTRIGINE LEVEL: Lamotrigine Lvl: 14.7 ug/mL (ref 2.0–20.0)

## 2022-10-11 DIAGNOSIS — I10 Essential (primary) hypertension: Secondary | ICD-10-CM | POA: Diagnosis not present

## 2022-10-15 ENCOUNTER — Encounter: Payer: Self-pay | Admitting: Family Medicine

## 2022-10-28 ENCOUNTER — Other Ambulatory Visit: Payer: Self-pay | Admitting: Cardiology

## 2022-10-28 DIAGNOSIS — I10 Essential (primary) hypertension: Secondary | ICD-10-CM

## 2022-11-10 DIAGNOSIS — I10 Essential (primary) hypertension: Secondary | ICD-10-CM | POA: Diagnosis not present

## 2022-11-15 DIAGNOSIS — M19011 Primary osteoarthritis, right shoulder: Secondary | ICD-10-CM | POA: Diagnosis not present

## 2022-11-19 DIAGNOSIS — M19011 Primary osteoarthritis, right shoulder: Secondary | ICD-10-CM | POA: Diagnosis not present

## 2022-11-21 ENCOUNTER — Encounter: Payer: Self-pay | Admitting: Family Medicine

## 2022-11-21 ENCOUNTER — Other Ambulatory Visit: Payer: Self-pay

## 2022-11-21 DIAGNOSIS — G250 Essential tremor: Secondary | ICD-10-CM

## 2022-11-22 ENCOUNTER — Telehealth: Payer: Self-pay | Admitting: Family Medicine

## 2022-11-22 NOTE — Telephone Encounter (Signed)
Neurology referral faxed to [facility] (fax# [phone], phone# [phone]) 

## 2022-12-02 ENCOUNTER — Other Ambulatory Visit: Payer: Self-pay | Admitting: Cardiology

## 2022-12-02 DIAGNOSIS — I5032 Chronic diastolic (congestive) heart failure: Secondary | ICD-10-CM

## 2022-12-02 DIAGNOSIS — I10 Essential (primary) hypertension: Secondary | ICD-10-CM

## 2022-12-04 NOTE — Progress Notes (Signed)
Averages January 2024 BP DATA Average Systolic BP Level 136.03 mmHg Lowest Systolic BP Level 116 mmHg Highest Systolic BP Level 153 mmHg  Feb 2024 Systolic Blood Pressure 135.0 (123.0 - 151.0) Diastolic Blood Pressure 82.2 (16.1 - 95.0) Heart Rate bpm -- 70.9 (64.0 - 86.0)  Mar 2024 Systolic Blood Pressure 132.7 (112.0 - 151.0) Diastolic Blood Pressure 80.3 (09.6 - 95.0) Heart Rate  71.1 (62.0 - 86.0)  April 2024 Systolic mmHg--136.2 (118.0 - 045.4) Diastolic mmHg--82.1 (65.0 - 94.0) Heart Rate bpm--68.6 (58.0 - 78.0)  Date Systolic Diastolic Units Heart Rate Units 09/07/22 9:52 AM 151 95 mmHg 73 bpm 09/08/22 8:07 AM 139 85 mmHg 68 bpm 09/09/22 10:08 AM 139 88 mmHg 70 bpm 09/10/22 9:23 AM 142 85 mmHg 71 bpm 09/11/22 9:49 AM 132 81 mmHg 66 bpm 09/12/22 9:52 AM 131 76 mmHg 69 bpm 09/13/22 9:58 AM 123 74 mmHg 64 bpm 09/14/22 8:39 AM 134 82 mmHg 66 bpm 09/15/22 9:41 AM 139 83 mmHg 65 bpm 09/16/22 10:05 AM 131 81 mmHg 67 bpm 09/17/22 10:23 AM 131 79 mmHg 74 bpm 09/18/22 8:57 AM 129 80 mmHg 67 bpm 09/19/22 9:33 AM 135 84 mmHg 70 bpm 09/20/22 10:11 AM 138 84 mmHg 69 bpm 09/21/22 10:02 AM 140 85 mmHg 69 bpm 09/22/22 10:22 AM 135 81 mmHg 71 bpm 09/23/22 10:24 AM 139 86 mmHg 71 bpm 09/24/22 10:32 AM 143 87 mmHg 68 bpm 09/25/22 10:38 AM 136 82 mmHg 68 bpm 09/26/22 9:50 AM 132 75 mmHg 74 bpm 09/27/22 10:49 AM 131 80 mmHg 69 bpm 09/28/22 10:24 AM 125 74 mmHg 74 bpm 09/29/22 9:20 AM 135 86 mmHg 81 bpm 09/30/22 10:58 AM 135 79 mmHg 79 bpm 10/01/22 9:44 AM 132 81 mmHg 86 bpm 10/02/22 10:27 AM 134 84 mmHg 75 bpm 10/03/22 9:19 AM 133 83 mmHg 69 bpm 10/04/22 9:05 AM 124 76 mmHg 74 bpm 10/05/22 9:58 AM 138 85 mmHg 71 bpm 10/06/22 9:36 AM 124 73 mmHg 71 bpm 10/07/22 8:37 AM 140 88 mmHg 76 bpm 10/08/22 10:44 AM 138 84 mmHg 68 bpm 10/09/22 9:50 AM 120 74 mmHg 76 bpm 10/10/22 9:43 AM 131 81 mmHg 73 bpm 10/11/22 9:58 AM 121 71 mmHg 73 bpm 10/12/22 10:59 AM 130 81 mmHg 71 bpm 10/13/22 8:02 AM 115 68 mmHg 68 bpm 10/14/22 8:53  AM 129 83 mmHg 83 bpm 10/15/22 10:28 AM 112 64 mmHg 78 bpm 10/16/22 9:18 AM 119 71 mmHg 82 bpm 10/17/22 10:47 AM 123 70 mmHg 68 bpm 10/18/22 9:04 AM 134 82 mmHg 75 bpm 10/19/22 9:59 AM 131 73 mmHg 68 bpm 10/20/22 10:52 AM 133 74 mmHg 70 bpm 10/21/22 8:44 AM 121 70 mmHg 64 bpm 10/22/22 9:49 AM 137 83 mmHg 71 bpm 10/23/22 9:07 AM 131 79 mmHg 72 bpm 10/24/22 10:00 AM 130 78 mmHg 71 bpm 10/25/22 10:35 AM 149 92 mmHg 71 bpm 10/26/22 10:07 AM 139 83 mmHg 69 bpm 10/27/22 10:43 AM 136 80 mmHg 69 bpm 10/28/22 8:10 AM 135 80 mmHg 69 bpm 10/29/22 10:50 AM 138 83 mmHg 63 bpm 10/30/22 8:49 AM 130 80 mmHg 62 bpm 10/31/22 8:48 AM 146 90 mmHg 69 bpm 11/01/22 9:10 AM 129 74 mmHg 76 bpm 11/02/22 9:46 AM 131 78 mmHg 70 bpm 11/03/22 8:56 AM 137 81 mmHg 72 bpm 11/04/22 8:41 AM 137 86 mmHg 68 bpm 11/09/22 9:52 AM 149 83 mmHg 67 bpm 11/10/22 9:49 AM 151 94 mmHg 69 bpm 11/11/22 10:24 AM 139 84 mmHg 71 bpm 11/12/22 10:07 AM 132  80 mmHg 64 bpm 11/13/22 11:01 AM 140 84 mmHg 68 bpm 11/14/22 9:45 AM 135 83 mmHg 64 bpm 11/15/22 10:14 AM 141 84 mmHg 71 bpm 11/16/22 10:24 AM 133 76 mmHg 78 bpm 11/17/22 9:11 AM 140 82 mmHg 65 bpm 11/18/22 6:44 AM 135 79 mmHg 72 bpm 11/19/22 9:37 AM 142 87 mmHg 61 bpm 11/20/22 8:19 AM 150 86 mmHg 68 bpm 11/21/22 10:23 AM 133 84 mmHg 74 bpm 11/22/22 10:10 AM 127 76 mmHg 68 bpm 11/23/22 9:46 AM 133 83 mmHg 72 bpm 11/24/22 9:42 AM 129 85 mmHg 69 bpm 11/25/22 10:10 AM 137 84 mmHg 73 bpm 11/26/22 9:57 AM 136 84 mmHg 68 bpm 11/27/22 10:41 AM 147 93 mmHg 74 bpm 11/28/22 9:58 AM 118 65 mmHg 77 bpm 11/29/22 9:15 AM 130 80 mmHg 76 bpm 11/30/22 9:44 AM 128 78 mmHg 65 bpm 12/01/22 10:37 AM 123 70 mmHg 63 bpm 12/02/22 8:26 AM 125 75 mmHg 64 bpm 12/03/22 9:23 AM 142 84 mmHg 65 bpm 12/04/22 10:13 AM 145 87 mmHg 58 bpm

## 2022-12-10 DIAGNOSIS — I10 Essential (primary) hypertension: Secondary | ICD-10-CM | POA: Diagnosis not present

## 2022-12-13 ENCOUNTER — Encounter: Payer: Self-pay | Admitting: Family Medicine

## 2023-01-04 DIAGNOSIS — X32XXXD Exposure to sunlight, subsequent encounter: Secondary | ICD-10-CM | POA: Diagnosis not present

## 2023-01-04 DIAGNOSIS — B078 Other viral warts: Secondary | ICD-10-CM | POA: Diagnosis not present

## 2023-01-04 DIAGNOSIS — L570 Actinic keratosis: Secondary | ICD-10-CM | POA: Diagnosis not present

## 2023-01-04 DIAGNOSIS — Z1283 Encounter for screening for malignant neoplasm of skin: Secondary | ICD-10-CM | POA: Diagnosis not present

## 2023-01-04 DIAGNOSIS — D225 Melanocytic nevi of trunk: Secondary | ICD-10-CM | POA: Diagnosis not present

## 2023-01-08 DIAGNOSIS — M7541 Impingement syndrome of right shoulder: Secondary | ICD-10-CM | POA: Diagnosis not present

## 2023-01-09 DIAGNOSIS — I10 Essential (primary) hypertension: Secondary | ICD-10-CM | POA: Diagnosis not present

## 2023-01-29 DIAGNOSIS — M25811 Other specified joint disorders, right shoulder: Secondary | ICD-10-CM | POA: Diagnosis not present

## 2023-01-29 DIAGNOSIS — M75101 Unspecified rotator cuff tear or rupture of right shoulder, not specified as traumatic: Secondary | ICD-10-CM | POA: Diagnosis not present

## 2023-01-29 DIAGNOSIS — Z01818 Encounter for other preprocedural examination: Secondary | ICD-10-CM | POA: Diagnosis not present

## 2023-01-29 DIAGNOSIS — M19011 Primary osteoarthritis, right shoulder: Secondary | ICD-10-CM | POA: Diagnosis not present

## 2023-01-29 DIAGNOSIS — Z01810 Encounter for preprocedural cardiovascular examination: Secondary | ICD-10-CM | POA: Diagnosis not present

## 2023-01-29 DIAGNOSIS — M7541 Impingement syndrome of right shoulder: Secondary | ICD-10-CM | POA: Diagnosis not present

## 2023-02-13 DIAGNOSIS — G8918 Other acute postprocedural pain: Secondary | ICD-10-CM | POA: Diagnosis not present

## 2023-02-13 DIAGNOSIS — Z79899 Other long term (current) drug therapy: Secondary | ICD-10-CM | POA: Diagnosis not present

## 2023-02-13 DIAGNOSIS — M25811 Other specified joint disorders, right shoulder: Secondary | ICD-10-CM | POA: Diagnosis not present

## 2023-02-13 DIAGNOSIS — M19011 Primary osteoarthritis, right shoulder: Secondary | ICD-10-CM | POA: Diagnosis not present

## 2023-02-13 DIAGNOSIS — I10 Essential (primary) hypertension: Secondary | ICD-10-CM | POA: Diagnosis not present

## 2023-02-13 DIAGNOSIS — Z885 Allergy status to narcotic agent status: Secondary | ICD-10-CM | POA: Diagnosis not present

## 2023-02-13 DIAGNOSIS — Z881 Allergy status to other antibiotic agents status: Secondary | ICD-10-CM | POA: Diagnosis not present

## 2023-02-13 DIAGNOSIS — M75101 Unspecified rotator cuff tear or rupture of right shoulder, not specified as traumatic: Secondary | ICD-10-CM | POA: Diagnosis not present

## 2023-02-13 DIAGNOSIS — Z882 Allergy status to sulfonamides status: Secondary | ICD-10-CM | POA: Diagnosis not present

## 2023-02-13 DIAGNOSIS — Z888 Allergy status to other drugs, medicaments and biological substances status: Secondary | ICD-10-CM | POA: Diagnosis not present

## 2023-02-13 DIAGNOSIS — M7541 Impingement syndrome of right shoulder: Secondary | ICD-10-CM | POA: Diagnosis not present

## 2023-02-13 DIAGNOSIS — S43431A Superior glenoid labrum lesion of right shoulder, initial encounter: Secondary | ICD-10-CM | POA: Diagnosis not present

## 2023-02-13 DIAGNOSIS — Z880 Allergy status to penicillin: Secondary | ICD-10-CM | POA: Diagnosis not present

## 2023-02-13 DIAGNOSIS — M25711 Osteophyte, right shoulder: Secondary | ICD-10-CM | POA: Diagnosis not present

## 2023-02-13 DIAGNOSIS — S46111A Strain of muscle, fascia and tendon of long head of biceps, right arm, initial encounter: Secondary | ICD-10-CM | POA: Diagnosis not present

## 2023-02-13 DIAGNOSIS — S46211A Strain of muscle, fascia and tendon of other parts of biceps, right arm, initial encounter: Secondary | ICD-10-CM | POA: Diagnosis not present

## 2023-02-15 DIAGNOSIS — K7689 Other specified diseases of liver: Secondary | ICD-10-CM | POA: Diagnosis not present

## 2023-02-15 DIAGNOSIS — T797XXA Traumatic subcutaneous emphysema, initial encounter: Secondary | ICD-10-CM | POA: Diagnosis not present

## 2023-02-15 DIAGNOSIS — I2510 Atherosclerotic heart disease of native coronary artery without angina pectoris: Secondary | ICD-10-CM | POA: Diagnosis not present

## 2023-02-15 DIAGNOSIS — R0602 Shortness of breath: Secondary | ICD-10-CM | POA: Diagnosis not present

## 2023-02-15 DIAGNOSIS — R918 Other nonspecific abnormal finding of lung field: Secondary | ICD-10-CM | POA: Diagnosis not present

## 2023-02-15 DIAGNOSIS — J9811 Atelectasis: Secondary | ICD-10-CM | POA: Diagnosis not present

## 2023-02-15 DIAGNOSIS — R0989 Other specified symptoms and signs involving the circulatory and respiratory systems: Secondary | ICD-10-CM | POA: Diagnosis not present

## 2023-02-15 DIAGNOSIS — R9431 Abnormal electrocardiogram [ECG] [EKG]: Secondary | ICD-10-CM | POA: Diagnosis not present

## 2023-02-19 DIAGNOSIS — E7800 Pure hypercholesterolemia, unspecified: Secondary | ICD-10-CM | POA: Diagnosis not present

## 2023-02-19 DIAGNOSIS — K219 Gastro-esophageal reflux disease without esophagitis: Secondary | ICD-10-CM | POA: Diagnosis not present

## 2023-02-19 DIAGNOSIS — Z09 Encounter for follow-up examination after completed treatment for conditions other than malignant neoplasm: Secondary | ICD-10-CM | POA: Diagnosis not present

## 2023-02-19 DIAGNOSIS — I10 Essential (primary) hypertension: Secondary | ICD-10-CM | POA: Diagnosis not present

## 2023-02-19 DIAGNOSIS — I5032 Chronic diastolic (congestive) heart failure: Secondary | ICD-10-CM | POA: Diagnosis not present

## 2023-02-20 DIAGNOSIS — J9690 Respiratory failure, unspecified, unspecified whether with hypoxia or hypercapnia: Secondary | ICD-10-CM | POA: Diagnosis not present

## 2023-02-20 DIAGNOSIS — J471 Bronchiectasis with (acute) exacerbation: Secondary | ICD-10-CM | POA: Diagnosis not present

## 2023-02-20 DIAGNOSIS — J188 Other pneumonia, unspecified organism: Secondary | ICD-10-CM | POA: Diagnosis not present

## 2023-02-20 DIAGNOSIS — G4733 Obstructive sleep apnea (adult) (pediatric): Secondary | ICD-10-CM | POA: Diagnosis not present

## 2023-03-02 DIAGNOSIS — J471 Bronchiectasis with (acute) exacerbation: Secondary | ICD-10-CM | POA: Diagnosis not present

## 2023-03-02 DIAGNOSIS — G4733 Obstructive sleep apnea (adult) (pediatric): Secondary | ICD-10-CM | POA: Diagnosis not present

## 2023-04-03 DIAGNOSIS — M25511 Pain in right shoulder: Secondary | ICD-10-CM | POA: Diagnosis not present

## 2023-04-03 DIAGNOSIS — M25611 Stiffness of right shoulder, not elsewhere classified: Secondary | ICD-10-CM | POA: Diagnosis not present

## 2023-04-03 DIAGNOSIS — Z4789 Encounter for other orthopedic aftercare: Secondary | ICD-10-CM | POA: Diagnosis not present

## 2023-04-03 DIAGNOSIS — M6281 Muscle weakness (generalized): Secondary | ICD-10-CM | POA: Diagnosis not present

## 2023-04-03 DIAGNOSIS — M25411 Effusion, right shoulder: Secondary | ICD-10-CM | POA: Diagnosis not present

## 2023-04-09 DIAGNOSIS — G250 Essential tremor: Secondary | ICD-10-CM | POA: Diagnosis not present

## 2023-04-10 DIAGNOSIS — M25511 Pain in right shoulder: Secondary | ICD-10-CM | POA: Diagnosis not present

## 2023-04-10 DIAGNOSIS — M25611 Stiffness of right shoulder, not elsewhere classified: Secondary | ICD-10-CM | POA: Diagnosis not present

## 2023-04-10 DIAGNOSIS — Z4789 Encounter for other orthopedic aftercare: Secondary | ICD-10-CM | POA: Diagnosis not present

## 2023-04-10 DIAGNOSIS — M6281 Muscle weakness (generalized): Secondary | ICD-10-CM | POA: Diagnosis not present

## 2023-04-10 DIAGNOSIS — M25411 Effusion, right shoulder: Secondary | ICD-10-CM | POA: Diagnosis not present

## 2023-04-12 DIAGNOSIS — M25411 Effusion, right shoulder: Secondary | ICD-10-CM | POA: Diagnosis not present

## 2023-04-12 DIAGNOSIS — M6281 Muscle weakness (generalized): Secondary | ICD-10-CM | POA: Diagnosis not present

## 2023-04-12 DIAGNOSIS — M25611 Stiffness of right shoulder, not elsewhere classified: Secondary | ICD-10-CM | POA: Diagnosis not present

## 2023-04-12 DIAGNOSIS — Z4789 Encounter for other orthopedic aftercare: Secondary | ICD-10-CM | POA: Diagnosis not present

## 2023-04-12 DIAGNOSIS — M25511 Pain in right shoulder: Secondary | ICD-10-CM | POA: Diagnosis not present

## 2023-04-15 DIAGNOSIS — M25511 Pain in right shoulder: Secondary | ICD-10-CM | POA: Diagnosis not present

## 2023-04-15 DIAGNOSIS — M25411 Effusion, right shoulder: Secondary | ICD-10-CM | POA: Diagnosis not present

## 2023-04-15 DIAGNOSIS — M25611 Stiffness of right shoulder, not elsewhere classified: Secondary | ICD-10-CM | POA: Diagnosis not present

## 2023-04-15 DIAGNOSIS — M6281 Muscle weakness (generalized): Secondary | ICD-10-CM | POA: Diagnosis not present

## 2023-04-15 DIAGNOSIS — Z4789 Encounter for other orthopedic aftercare: Secondary | ICD-10-CM | POA: Diagnosis not present

## 2023-04-17 DIAGNOSIS — Z4789 Encounter for other orthopedic aftercare: Secondary | ICD-10-CM | POA: Diagnosis not present

## 2023-04-17 DIAGNOSIS — M25411 Effusion, right shoulder: Secondary | ICD-10-CM | POA: Diagnosis not present

## 2023-04-17 DIAGNOSIS — M6281 Muscle weakness (generalized): Secondary | ICD-10-CM | POA: Diagnosis not present

## 2023-04-17 DIAGNOSIS — M25511 Pain in right shoulder: Secondary | ICD-10-CM | POA: Diagnosis not present

## 2023-04-17 DIAGNOSIS — M25611 Stiffness of right shoulder, not elsewhere classified: Secondary | ICD-10-CM | POA: Diagnosis not present

## 2023-04-22 DIAGNOSIS — M25511 Pain in right shoulder: Secondary | ICD-10-CM | POA: Diagnosis not present

## 2023-04-22 DIAGNOSIS — M6281 Muscle weakness (generalized): Secondary | ICD-10-CM | POA: Diagnosis not present

## 2023-04-22 DIAGNOSIS — M25611 Stiffness of right shoulder, not elsewhere classified: Secondary | ICD-10-CM | POA: Diagnosis not present

## 2023-04-22 DIAGNOSIS — M25411 Effusion, right shoulder: Secondary | ICD-10-CM | POA: Diagnosis not present

## 2023-04-22 DIAGNOSIS — Z4789 Encounter for other orthopedic aftercare: Secondary | ICD-10-CM | POA: Diagnosis not present

## 2023-04-23 ENCOUNTER — Encounter (HOSPITAL_BASED_OUTPATIENT_CLINIC_OR_DEPARTMENT_OTHER): Payer: Self-pay | Admitting: Pulmonary Disease

## 2023-04-23 ENCOUNTER — Ambulatory Visit (HOSPITAL_BASED_OUTPATIENT_CLINIC_OR_DEPARTMENT_OTHER): Payer: Medicare (Managed Care) | Admitting: Pulmonary Disease

## 2023-04-23 VITALS — BP 160/92 | HR 82 | Ht 70.0 in | Wt 198.0 lb

## 2023-04-23 DIAGNOSIS — I10 Essential (primary) hypertension: Secondary | ICD-10-CM

## 2023-04-23 DIAGNOSIS — G4733 Obstructive sleep apnea (adult) (pediatric): Secondary | ICD-10-CM | POA: Diagnosis not present

## 2023-04-23 NOTE — Progress Notes (Signed)
Subjective:    Patient ID: [name], male    DOB: [birth year], 77 y.o.   MRN: [id number]  HPI  77 yo for FU of obstructive sleep apnea and mild bronchiectasis .  -He was on oxygen until 07/2021 when we discontinued   PMH - difficult to control hypertension 03/2021 enterococcal UTI, C. 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 Blood pressure is high today, he denies headache or blurred vision.  He is under a remote monitoring program. He underwent rotator cuff surgery and mobility is slowly improving with physical therapy. He denies any problems with mask or pressure CPAP is maintained at 12 cm.  He reports good compliance   Significant tests/ events reviewed     07/2021 ONO on CPAP/room air shows minimal desaturation for 4 minutes total less than 88%.   PSG 02/2002 - mild OSA with AHI 16/h, lowest satn of 70% >> CPAP 9   10/30/2011 SPlit study showed O2 still required -esp during REM sleep  Increased CpAP to 12 cm , Keep 3 L O2 blended in    07/2016 PFTs nml   03/11/21 CTA chest did not show pulm embolism, showed bibasilar atelectasis and scarring with a small left pleural effusion, prominent mediastinal lymphadenopathy similar to 2016    Review of Systems neg for any significant sore throat, dysphagia, itching, sneezing, nasal congestion or excess/ purulent secretions, fever, chills, sweats, unintended wt loss, pleuritic or exertional cp, hempoptysis, orthopnea pnd or change in chronic leg swelling. Also denies presyncope, palpitations, heartburn, abdominal pain, nausea, vomiting, diarrhea or change in bowel or urinary habits, dysuria,hematuria, rash, arthralgias, visual complaints, headache, numbness weakness or ataxia.     Objective:   Physical Exam  Gen. Pleasant, well-nourished, in no distress ENT - no thrush, no pallor/icterus,no post nasal drip Neck: No JVD, no thyromegaly,  no carotid bruits Lungs: no use of accessory muscles, no dullness to percussion, clear without rales or rhonchi  Cardiovascular: Rhythm regular, heart sounds  normal, no murmurs or gallops, no peripheral edema Musculoskeletal: No deformities, no cyanosis or clubbing , decreased mobility RT shoulder       Assessment & Plan:

## 2023-04-23 NOTE — Assessment & Plan Note (Signed)
He will recheck blood pressure at home and follow-up with cardiology

## 2023-04-23 NOTE — Patient Instructions (Signed)
CPAP is working well X change to autoCPAP 12-14 cm  X Recheck BP

## 2023-04-23 NOTE — Assessment & Plan Note (Signed)
CPAP download was reviewed which shows excellent control of events on 12 cm with mild leak.  He is very compliant about 8 hours per night without a single missed night.  CPAP is only helped improve his daytime somnolence and fatigue. On certain nights AHI seems to increase to 10. Will change him to auto settings 12 to 14 cm  Weight loss encouraged, compliance with goal of at least 4-6 hrs every night is the expectation. Advised against medications with sedative side effects Cautioned against driving when sleepy - understanding that sleepiness will vary on a day to day basis

## 2023-04-24 DIAGNOSIS — M6281 Muscle weakness (generalized): Secondary | ICD-10-CM | POA: Diagnosis not present

## 2023-04-24 DIAGNOSIS — M25611 Stiffness of right shoulder, not elsewhere classified: Secondary | ICD-10-CM | POA: Diagnosis not present

## 2023-04-24 DIAGNOSIS — M25411 Effusion, right shoulder: Secondary | ICD-10-CM | POA: Diagnosis not present

## 2023-04-24 DIAGNOSIS — M25511 Pain in right shoulder: Secondary | ICD-10-CM | POA: Diagnosis not present

## 2023-04-24 DIAGNOSIS — Z4789 Encounter for other orthopedic aftercare: Secondary | ICD-10-CM | POA: Diagnosis not present

## 2023-04-26 ENCOUNTER — Other Ambulatory Visit: Payer: Self-pay | Admitting: Cardiology

## 2023-04-30 DIAGNOSIS — M25511 Pain in right shoulder: Secondary | ICD-10-CM | POA: Diagnosis not present

## 2023-04-30 DIAGNOSIS — M25611 Stiffness of right shoulder, not elsewhere classified: Secondary | ICD-10-CM | POA: Diagnosis not present

## 2023-04-30 DIAGNOSIS — M25411 Effusion, right shoulder: Secondary | ICD-10-CM | POA: Diagnosis not present

## 2023-04-30 DIAGNOSIS — M6281 Muscle weakness (generalized): Secondary | ICD-10-CM | POA: Diagnosis not present

## 2023-04-30 DIAGNOSIS — Z4789 Encounter for other orthopedic aftercare: Secondary | ICD-10-CM | POA: Diagnosis not present

## 2023-05-02 DIAGNOSIS — M25611 Stiffness of right shoulder, not elsewhere classified: Secondary | ICD-10-CM | POA: Diagnosis not present

## 2023-05-02 DIAGNOSIS — M6281 Muscle weakness (generalized): Secondary | ICD-10-CM | POA: Diagnosis not present

## 2023-05-02 DIAGNOSIS — M25411 Effusion, right shoulder: Secondary | ICD-10-CM | POA: Diagnosis not present

## 2023-05-02 DIAGNOSIS — M25511 Pain in right shoulder: Secondary | ICD-10-CM | POA: Diagnosis not present

## 2023-05-05 ENCOUNTER — Other Ambulatory Visit: Payer: Self-pay | Admitting: Cardiology

## 2023-05-05 DIAGNOSIS — I10 Essential (primary) hypertension: Secondary | ICD-10-CM

## 2023-05-07 DIAGNOSIS — M25611 Stiffness of right shoulder, not elsewhere classified: Secondary | ICD-10-CM | POA: Diagnosis not present

## 2023-05-07 DIAGNOSIS — M6281 Muscle weakness (generalized): Secondary | ICD-10-CM | POA: Diagnosis not present

## 2023-05-07 DIAGNOSIS — M25411 Effusion, right shoulder: Secondary | ICD-10-CM | POA: Diagnosis not present

## 2023-05-07 DIAGNOSIS — M25511 Pain in right shoulder: Secondary | ICD-10-CM | POA: Diagnosis not present

## 2023-05-07 DIAGNOSIS — Z4789 Encounter for other orthopedic aftercare: Secondary | ICD-10-CM | POA: Diagnosis not present

## 2023-05-08 DIAGNOSIS — Z4789 Encounter for other orthopedic aftercare: Secondary | ICD-10-CM | POA: Diagnosis not present

## 2023-05-09 DIAGNOSIS — M25611 Stiffness of right shoulder, not elsewhere classified: Secondary | ICD-10-CM | POA: Diagnosis not present

## 2023-05-09 DIAGNOSIS — Z4789 Encounter for other orthopedic aftercare: Secondary | ICD-10-CM | POA: Diagnosis not present

## 2023-05-09 DIAGNOSIS — M25511 Pain in right shoulder: Secondary | ICD-10-CM | POA: Diagnosis not present

## 2023-05-09 DIAGNOSIS — M25411 Effusion, right shoulder: Secondary | ICD-10-CM | POA: Diagnosis not present

## 2023-05-09 DIAGNOSIS — M6281 Muscle weakness (generalized): Secondary | ICD-10-CM | POA: Diagnosis not present

## 2023-05-13 ENCOUNTER — Encounter: Payer: Self-pay | Admitting: Cardiology

## 2023-05-13 ENCOUNTER — Ambulatory Visit: Payer: Medicare (Managed Care) | Attending: Cardiology | Admitting: Cardiology

## 2023-05-13 VITALS — BP 142/82 | HR 76 | Resp 16 | Ht 70.0 in | Wt 197.2 lb

## 2023-05-13 DIAGNOSIS — E782 Mixed hyperlipidemia: Secondary | ICD-10-CM | POA: Diagnosis not present

## 2023-05-13 DIAGNOSIS — I10 Essential (primary) hypertension: Secondary | ICD-10-CM | POA: Diagnosis not present

## 2023-05-13 DIAGNOSIS — I5032 Chronic diastolic (congestive) heart failure: Secondary | ICD-10-CM

## 2023-05-13 NOTE — Patient Instructions (Signed)
Medication Instructions:  The current medical regimen is effective;  continue present plan and medications.  *If you need a refill on your cardiac medications before your next appointment, please call your pharmacy*  Follow-Up: At [facility], you and your health needs are our priority.  As part of our continuing mission to provide you with exceptional heart care, we have created designated Provider Care Teams.  These Care Teams include your primary Cardiologist (physician) and Advanced Practice Providers (APPs -  Physician Assistants and Nurse Practitioners) who all work together to provide you with the care you need, when you need it.  We recommend signing up for the patient portal called "MyChart".  Sign up information is provided on this After Visit Summary.  MyChart is used to connect with patients for Virtual Visits (Telemedicine).  Patients are able to view lab/test results, encounter notes, upcoming appointments, etc.  Non-urgent messages can be sent to your provider as well.   To learn more about what you can do with MyChart, go to [email or web address].    Your next appointment:   Follow up as needed.  

## 2023-05-13 NOTE — Progress Notes (Signed)
Cardiology Office Note:  .   Date:  05/13/2023  ID:  [name], DOB June 28, 1946, MRN [id number] PCP: Glenn Brunette, MD  [facility] Providers Cardiologist:  Glenn Mainland, MD PCP: Glenn Brunette, MD  Chief Complaint  Patient presents with   Chronic heart failure with preserved ejection fraction    Essential hypertension   Follow-up    1 year      History of Present Illness: .    [name] is a 77 y.o. male with hypertension, h/o cancer, peripheral neuropathy, HFpEF  Patient is doing well.  He recently underwent shoulder surgery and is currently undergoing rehab.  Denies any chest pain, shortness of breath symptoms.  Blood pressure is elevated today.  However, historically, blood pressure has been elevated office visits and typically much lower at home.  He has upcoming follow-up with Dr. Renne [name] in December 2024.  Denies any orthopnea, PND, leg symptoms.    Vitals:   05/13/23 1307  BP: (!) 142/82  Pulse: 76  Resp: 16  SpO2: 97%     ROS:  Review of Systems  Cardiovascular:  Negative for chest pain, dyspnea on exertion, leg swelling, palpitations and syncope.     Studies Reviewed: Marland Kitchen       EKG 05/13/2023: Unusual P axis, possible ectopic atrial rhythm with Premature atrial complexes with Abberant conduction When compared with ECG of 09-Mar-2021 20:10, No significant change since     Physical Exam:   Physical Exam Vitals and nursing note reviewed.  Constitutional:      General: He is not in acute distress. Neck:     Vascular: No JVD.  Cardiovascular:     Rate and Rhythm: Normal rate and regular rhythm.     Heart sounds: Normal heart sounds. No murmur heard. Pulmonary:     Effort: Pulmonary effort is normal.     Breath sounds: Normal breath sounds. No wheezing or rales.  Musculoskeletal:     Right lower leg: No edema.     Left lower leg: No edema.      VISIT DIAGNOSES:   ICD-10-CM   1. Chronic heart failure with preserved ejection  fraction (HCC)  I50.32 EKG 12-Lead    2. Essential hypertension  I10     3. Mixed hyperlipidemia  E78.2        ASSESSMENT AND PLAN: .    [name] is a 77 y.o. male with hypertension, h/o cancer, peripheral neuropathy, HFpEF   HFpEF: Normal EF, grade III diastolic function at baseline with normal strain. Low suspicion for infiltrative cardiomyopathy. 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  Continue losartan 50 mg, spironolactone 25 mg daily, In future, should he have recurrent symptoms of heart failure, will add SGLT2i such as Jardiance. At present, he is euvolemic and stable from cardiac standpoint.  I have refilled losartan and spironolactone today by Dr. Larose [name] for future refills, and I will see him as needed.  Patient's neurologist wants to use propranolol for essential tremors, okay with me.  Hypertension: White coat hypertension, otherwise well controlled.    Mixed hyperlipidemia: Well controlled. LDL 57   F/u as needed  Signed, Glenn Negus, MD

## 2023-05-14 DIAGNOSIS — M25511 Pain in right shoulder: Secondary | ICD-10-CM | POA: Diagnosis not present

## 2023-05-14 DIAGNOSIS — M25411 Effusion, right shoulder: Secondary | ICD-10-CM | POA: Diagnosis not present

## 2023-05-14 DIAGNOSIS — M25611 Stiffness of right shoulder, not elsewhere classified: Secondary | ICD-10-CM | POA: Diagnosis not present

## 2023-05-14 DIAGNOSIS — M6281 Muscle weakness (generalized): Secondary | ICD-10-CM | POA: Diagnosis not present

## 2023-05-14 DIAGNOSIS — Z4789 Encounter for other orthopedic aftercare: Secondary | ICD-10-CM | POA: Diagnosis not present

## 2023-05-16 DIAGNOSIS — M25611 Stiffness of right shoulder, not elsewhere classified: Secondary | ICD-10-CM | POA: Diagnosis not present

## 2023-05-16 DIAGNOSIS — M25511 Pain in right shoulder: Secondary | ICD-10-CM | POA: Diagnosis not present

## 2023-05-16 DIAGNOSIS — Z4789 Encounter for other orthopedic aftercare: Secondary | ICD-10-CM | POA: Diagnosis not present

## 2023-05-16 DIAGNOSIS — M25411 Effusion, right shoulder: Secondary | ICD-10-CM | POA: Diagnosis not present

## 2023-05-16 DIAGNOSIS — M6281 Muscle weakness (generalized): Secondary | ICD-10-CM | POA: Diagnosis not present

## 2023-05-21 DIAGNOSIS — M25411 Effusion, right shoulder: Secondary | ICD-10-CM | POA: Diagnosis not present

## 2023-05-21 DIAGNOSIS — Z4789 Encounter for other orthopedic aftercare: Secondary | ICD-10-CM | POA: Diagnosis not present

## 2023-05-21 DIAGNOSIS — M6281 Muscle weakness (generalized): Secondary | ICD-10-CM | POA: Diagnosis not present

## 2023-05-21 DIAGNOSIS — M25511 Pain in right shoulder: Secondary | ICD-10-CM | POA: Diagnosis not present

## 2023-05-21 DIAGNOSIS — M25611 Stiffness of right shoulder, not elsewhere classified: Secondary | ICD-10-CM | POA: Diagnosis not present

## 2023-05-24 DIAGNOSIS — M25611 Stiffness of right shoulder, not elsewhere classified: Secondary | ICD-10-CM | POA: Diagnosis not present

## 2023-05-24 DIAGNOSIS — M6281 Muscle weakness (generalized): Secondary | ICD-10-CM | POA: Diagnosis not present

## 2023-05-24 DIAGNOSIS — Z4789 Encounter for other orthopedic aftercare: Secondary | ICD-10-CM | POA: Diagnosis not present

## 2023-05-24 DIAGNOSIS — M25411 Effusion, right shoulder: Secondary | ICD-10-CM | POA: Diagnosis not present

## 2023-05-24 DIAGNOSIS — M25511 Pain in right shoulder: Secondary | ICD-10-CM | POA: Diagnosis not present

## 2023-05-26 ENCOUNTER — Other Ambulatory Visit: Payer: Self-pay

## 2023-05-26 ENCOUNTER — Emergency Department (HOSPITAL_BASED_OUTPATIENT_CLINIC_OR_DEPARTMENT_OTHER)
Admission: EM | Admit: 2023-05-26 | Discharge: 2023-05-26 | Disposition: A | Discharge status: Home / Self Care | Payer: Medicare (Managed Care) | Attending: Emergency Medicine | Admitting: Emergency Medicine

## 2023-05-26 ENCOUNTER — Encounter (HOSPITAL_BASED_OUTPATIENT_CLINIC_OR_DEPARTMENT_OTHER): Payer: Self-pay

## 2023-05-26 DIAGNOSIS — M546 Pain in thoracic spine: Secondary | ICD-10-CM | POA: Diagnosis not present

## 2023-05-26 DIAGNOSIS — M549 Dorsalgia, unspecified: Secondary | ICD-10-CM | POA: Diagnosis present

## 2023-05-26 LAB — URINALYSIS, ROUTINE W REFLEX MICROSCOPIC
Bilirubin Urine: NEGATIVE
Glucose, UA: NEGATIVE mg/dL
Hgb urine dipstick: NEGATIVE
Ketones, ur: NEGATIVE mg/dL
Leukocytes,Ua: NEGATIVE
Nitrite: NEGATIVE
Protein, ur: NEGATIVE mg/dL
Specific Gravity, Urine: 1.015 (ref 1.005–1.030)
pH: 5 (ref 5.0–8.0)

## 2023-05-26 MED ORDER — ONDANSETRON 4 MG PO TBDP: 4.0000 mg | ORAL_TABLET | Freq: Three times a day (TID) | ORAL | 0 refills | Status: DC | PRN

## 2023-05-26 MED ORDER — LIDOCAINE 5 % EX PTCH: 1.0000 | MEDICATED_PATCH | CUTANEOUS | 0 refills | Status: AC

## 2023-05-26 MED ORDER — LIDOCAINE 5 % EX PTCH
1.0000 | MEDICATED_PATCH | CUTANEOUS | Status: DC
  Administered 2023-05-26: 1 via TRANSDERMAL
  Filled 2023-05-26: qty 1

## 2023-05-26 MED ORDER — HYDROCODONE-ACETAMINOPHEN 5-325 MG PO TABS
1.0000 | ORAL_TABLET | Freq: Three times a day (TID) | ORAL | 0 refills | Status: DC | PRN
Start: 2023-05-26 — End: 2024-03-23

## 2023-05-26 NOTE — ED Notes (Signed)
D/c paperwork reviewed with pt, including prescriptions and follow up care.  All questions and/or concerns addressed at time of d/c.  No further needs expressed. . Pt verbalized understanding, Ambulatory with family to ED exit, NAD.   

## 2023-05-26 NOTE — ED Triage Notes (Addendum)
Pt reports he was bending down to pick something up and when he raised up hit left side mid back. Conts to hurt. Difficulty standing up . Noted to have a small dried abrasion to area  .Occurred Friday morning

## 2023-05-26 NOTE — ED Provider Notes (Signed)
[facility] Provider Note   CSN: [id number] Arrival date & time: 05/26/23  1202     History  Chief Complaint  Patient presents with   Back Pain    [name] is a 77 y.o. male presented to ED with isolated pain in his left posterior back after bending over Friday and then straining up and striking his back on the edge of his hard surface.  He says the pain is gradually gotten worse over the past few days.  It hurts with any type of movement.  It is not worse with inspiration.  He denies paresthesias down his legs.  HPI     Home Medications Prior to Admission medications   Medication Sig Start Date End Date Taking? Authorizing Provider  HYDROcodone-acetaminophen (NORCO/VICODIN) 5-325 MG tablet Take 1 tablet by mouth every 8 (eight) hours as needed for up to 5 doses for severe pain (pain score 7-10). 05/26/23  Yes Keiaira Donlan, Kermit Balo, MD  lidocaine (LIDODERM) 5 % Place 1 patch onto the skin daily for 14 doses. Remove & Discard patch within 12 hours or as directed by MD 05/26/23 06/09/23 Yes Keshara Kiger, Kermit Balo, MD  ondansetron (ZOFRAN-ODT) 4 MG disintegrating tablet Take 1 tablet (4 mg total) by mouth every 8 (eight) hours as needed for up to 10 doses for refractory nausea / vomiting. 05/26/23  Yes Terald Sleeper, MD  acetaminophen (TYLENOL) 325 MG tablet Take 650 mg by mouth every 6 (six) hours as needed for mild pain, fever or headache.    [provider]  allopurinol (ZYLOPRIM) 100 MG tablet Take 150 mg by mouth daily.    [provider]  Cholecalciferol (D3 2000) 50 MCG (2000 UT) CAPS Take 2,000 Units by mouth daily.    [provider]  ezetimibe (ZETIA) 10 MG tablet Take 10 mg by mouth daily. 02/11/22   [provider]  felodipine (PLENDIL) 10 MG 24 hr tablet Take 10 mg by mouth daily. 02/17/21   [provider]  fexofenadine (ALLEGRA) 60 MG tablet Take 60 mg by mouth as needed for  allergies or rhinitis.    [provider]  Fiber CAPS Take 1 capsule by mouth daily.    [provider]  fluorometholone (FML) 0.1 % ophthalmic suspension Place 1 drop into both eyes 3 (three) times daily. 03/18/22   [provider]  furosemide (LASIX) 40 MG tablet TAKE ONE TABLET BY MOUTH ONE TIME DAILY 05/01/23   Patwardhan, Anabel Bene, MD  ketorolac (ACULAR) 0.5 % ophthalmic solution Place 1 drop into both eyes in the morning, at noon, in the evening, and at bedtime. 10/02/21   [provider]  lamoTRIgine (LAMICTAL) 150 MG tablet Take 2 tablets (300 mg total) by mouth 2 (two) times daily. 10/03/22   Lomax, Amy, NP  losartan (COZAAR) 50 MG tablet TAKE ONE TABLET BY MOUTH ONE TIME DAILY 05/06/23   Patwardhan, Anabel Bene, MD  Multiple Vitamin (MULTIVITAMIN) tablet Take 1 tablet by mouth daily.    [provider]  ondansetron (ZOFRAN) 4 MG tablet Take 1 tablet (4 mg total) by mouth every 6 (six) hours as needed for nausea. 03/20/21   Joseph Art, DO  pantoprazole (PROTONIX) 40 MG tablet Take 1 tablet (40 mg total) by mouth daily at 6 (six) AM. 03/21/21   Benjamine Mola, Selinda Orion, DO  primidone (MYSOLINE) 50 MG tablet Take 1.5 tablets (75 mg total) by mouth 2 (two) times daily. TAKE ONE  TABLET BY MOUTH EVERY MORNING AND TAKE ONE TABLET BY MOUTH EVERY NIGHT AT BEDTIME 10/03/22   Lomax, Amy, NP  rosuvastatin (CRESTOR) 20 MG tablet Take 20 mg by mouth at bedtime. 08/15/21   [provider]  spironolactone (ALDACTONE) 25 MG tablet TAKE ONE TABLET BY MOUTH ONE TIME DAILY 12/03/22   Patwardhan, Anabel Bene, MD  zolpidem (AMBIEN) 10 MG tablet Take 5-10 mg by mouth at bedtime as needed for sleep.    [provider]      Allergies    Doxycycline, Penicillins, Azathioprine, Mycophenolate mofetil, Azithromycin, Codeine, and Sulfa antibiotics    Review of Systems   Review of Systems  Physical Exam Updated Vital Signs BP (!) 148/82 (BP Location: Right Arm)   Pulse  86   Temp 97.8 F (36.6 C) (Oral)   Resp 17   SpO2 98%  Physical Exam Constitutional:      General: He is not in acute distress. HENT:     Head: Normocephalic and atraumatic.  Eyes:     Conjunctiva/sclera: Conjunctivae normal.     Pupils: Pupils are equal, round, and reactive to light.  Cardiovascular:     Rate and Rhythm: Normal rate and regular rhythm.  Pulmonary:     Effort: Pulmonary effort is normal. No respiratory distress.  Abdominal:     General: There is no distension.     Tenderness: There is no abdominal tenderness.  Musculoskeletal:     Comments: Patient has some tenderness with palpation of the left posterior rib without visible ecchymosis or deformity, no spinal midline tenderness  Skin:    General: Skin is warm and dry.  Neurological:     General: No focal deficit present.     Mental Status: He is alert. Mental status is at baseline.  Psychiatric:        Mood and Affect: Mood normal.        Behavior: Behavior normal.     ED Results / Procedures / Treatments   Labs (all labs ordered are listed, but only abnormal results are displayed) Labs Reviewed  URINALYSIS, ROUTINE W REFLEX MICROSCOPIC    EKG None  Radiology No results found.  Procedures Procedures    Medications Ordered in ED Medications  lidocaine (LIDODERM) 5 % 1 patch (has no administration in time range)    ED Course/ Medical Decision Making/ A&P                                 Medical Decision Making Amount and/or Complexity of Data Reviewed Labs: ordered.   Patient is here with an injury to his left back which is currently suspect may be a muscular contusion or perhaps muscular hematoma.  There may also be a mild underlying rib fracture but I doubt flail chest, doubt spinal fracture.  I do not see indication for x-ray or CT imaging at this time.  This can be managed conservatively.  He uses Aleve at home and can add on Tylenol.  We will try Lidoderm patches.  I have prescribed 5  tablets of Norco which he can use as needed as well as Zofran for nausea for medication side effect, for breakthrough pain at home.  He will otherwise follow-up with his PCP.  His wife is taking him home        Final Clinical Impression(s) / ED Diagnoses Final diagnoses:  Acute left-sided thoracic back pain    Rx /  DC Orders ED Discharge Orders          Ordered    HYDROcodone-acetaminophen (NORCO/VICODIN) 5-325 MG tablet  Every 8 hours PRN        05/26/23 1343    ondansetron (ZOFRAN-ODT) 4 MG disintegrating tablet  Every 8 hours PRN        05/26/23 1343    lidocaine (LIDODERM) 5 %  Every 24 hours        05/26/23 1344              Terald Sleeper, MD 05/26/23 1344

## 2023-05-26 NOTE — Discharge Instructions (Signed)
You may have bruising or small mount of bleeding on the muscles of your left back.  He may also have a small rib fracture.  Both of these are painful conditions which can take 4 to 6 weeks to resolve.  You can use ice on your back, over-the-counter muscle rubs, Tylenol as well as continuing Aleve.  Please reserve Norco for severe pain.  I prescribed Zofran as a nausea medicine to take with your Norco to prevent side effects for nausea from that medicine.  Follow-up in your primary care doctor's clinic.

## 2023-06-11 DIAGNOSIS — M25411 Effusion, right shoulder: Secondary | ICD-10-CM | POA: Diagnosis not present

## 2023-06-11 DIAGNOSIS — M25611 Stiffness of right shoulder, not elsewhere classified: Secondary | ICD-10-CM | POA: Diagnosis not present

## 2023-06-11 DIAGNOSIS — M25511 Pain in right shoulder: Secondary | ICD-10-CM | POA: Diagnosis not present

## 2023-06-11 DIAGNOSIS — M6281 Muscle weakness (generalized): Secondary | ICD-10-CM | POA: Diagnosis not present

## 2023-06-14 DIAGNOSIS — M25411 Effusion, right shoulder: Secondary | ICD-10-CM | POA: Diagnosis not present

## 2023-06-14 DIAGNOSIS — M25511 Pain in right shoulder: Secondary | ICD-10-CM | POA: Diagnosis not present

## 2023-06-14 DIAGNOSIS — M25611 Stiffness of right shoulder, not elsewhere classified: Secondary | ICD-10-CM | POA: Diagnosis not present

## 2023-06-14 DIAGNOSIS — M6281 Muscle weakness (generalized): Secondary | ICD-10-CM | POA: Diagnosis not present

## 2023-06-17 DIAGNOSIS — M6281 Muscle weakness (generalized): Secondary | ICD-10-CM | POA: Diagnosis not present

## 2023-06-17 DIAGNOSIS — M25611 Stiffness of right shoulder, not elsewhere classified: Secondary | ICD-10-CM | POA: Diagnosis not present

## 2023-06-17 DIAGNOSIS — M25411 Effusion, right shoulder: Secondary | ICD-10-CM | POA: Diagnosis not present

## 2023-06-17 DIAGNOSIS — M25511 Pain in right shoulder: Secondary | ICD-10-CM | POA: Diagnosis not present

## 2023-06-20 DIAGNOSIS — M25811 Other specified joint disorders, right shoulder: Secondary | ICD-10-CM | POA: Diagnosis not present

## 2023-06-21 DIAGNOSIS — M25411 Effusion, right shoulder: Secondary | ICD-10-CM | POA: Diagnosis not present

## 2023-06-21 DIAGNOSIS — M25511 Pain in right shoulder: Secondary | ICD-10-CM | POA: Diagnosis not present

## 2023-06-21 DIAGNOSIS — M25611 Stiffness of right shoulder, not elsewhere classified: Secondary | ICD-10-CM | POA: Diagnosis not present

## 2023-06-21 DIAGNOSIS — M6281 Muscle weakness (generalized): Secondary | ICD-10-CM | POA: Diagnosis not present

## 2023-06-26 DIAGNOSIS — M25411 Effusion, right shoulder: Secondary | ICD-10-CM | POA: Diagnosis not present

## 2023-06-26 DIAGNOSIS — M25611 Stiffness of right shoulder, not elsewhere classified: Secondary | ICD-10-CM | POA: Diagnosis not present

## 2023-06-26 DIAGNOSIS — M6281 Muscle weakness (generalized): Secondary | ICD-10-CM | POA: Diagnosis not present

## 2023-06-26 DIAGNOSIS — M25511 Pain in right shoulder: Secondary | ICD-10-CM | POA: Diagnosis not present

## 2023-06-28 DIAGNOSIS — M25611 Stiffness of right shoulder, not elsewhere classified: Secondary | ICD-10-CM | POA: Diagnosis not present

## 2023-06-28 DIAGNOSIS — M25411 Effusion, right shoulder: Secondary | ICD-10-CM | POA: Diagnosis not present

## 2023-06-28 DIAGNOSIS — M6281 Muscle weakness (generalized): Secondary | ICD-10-CM | POA: Diagnosis not present

## 2023-06-28 DIAGNOSIS — M25511 Pain in right shoulder: Secondary | ICD-10-CM | POA: Diagnosis not present

## 2023-07-03 DIAGNOSIS — M25511 Pain in right shoulder: Secondary | ICD-10-CM | POA: Diagnosis not present

## 2023-07-03 DIAGNOSIS — M25611 Stiffness of right shoulder, not elsewhere classified: Secondary | ICD-10-CM | POA: Diagnosis not present

## 2023-07-03 DIAGNOSIS — M25411 Effusion, right shoulder: Secondary | ICD-10-CM | POA: Diagnosis not present

## 2023-07-03 DIAGNOSIS — M6281 Muscle weakness (generalized): Secondary | ICD-10-CM | POA: Diagnosis not present

## 2023-07-12 DIAGNOSIS — E7800 Pure hypercholesterolemia, unspecified: Secondary | ICD-10-CM | POA: Diagnosis not present

## 2023-07-12 DIAGNOSIS — Z125 Encounter for screening for malignant neoplasm of prostate: Secondary | ICD-10-CM | POA: Diagnosis not present

## 2023-07-12 DIAGNOSIS — I10 Essential (primary) hypertension: Secondary | ICD-10-CM | POA: Diagnosis not present

## 2023-07-15 DIAGNOSIS — E781 Pure hyperglyceridemia: Secondary | ICD-10-CM | POA: Diagnosis not present

## 2023-07-15 DIAGNOSIS — I5030 Unspecified diastolic (congestive) heart failure: Secondary | ICD-10-CM | POA: Diagnosis not present

## 2023-07-15 DIAGNOSIS — Z8546 Personal history of malignant neoplasm of prostate: Secondary | ICD-10-CM | POA: Diagnosis not present

## 2023-07-15 DIAGNOSIS — G250 Essential tremor: Secondary | ICD-10-CM | POA: Diagnosis not present

## 2023-07-15 DIAGNOSIS — M109 Gout, unspecified: Secondary | ICD-10-CM | POA: Diagnosis not present

## 2023-07-15 DIAGNOSIS — I700 Atherosclerosis of aorta: Secondary | ICD-10-CM | POA: Diagnosis not present

## 2023-07-15 DIAGNOSIS — J309 Allergic rhinitis, unspecified: Secondary | ICD-10-CM | POA: Diagnosis not present

## 2023-07-15 DIAGNOSIS — I10 Essential (primary) hypertension: Secondary | ICD-10-CM | POA: Diagnosis not present

## 2023-07-15 DIAGNOSIS — D696 Thrombocytopenia, unspecified: Secondary | ICD-10-CM | POA: Diagnosis not present

## 2023-07-15 DIAGNOSIS — Z0000 Encounter for general adult medical examination without abnormal findings: Secondary | ICD-10-CM | POA: Diagnosis not present

## 2023-07-15 DIAGNOSIS — J387 Other diseases of larynx: Secondary | ICD-10-CM | POA: Diagnosis not present

## 2023-07-15 DIAGNOSIS — G629 Polyneuropathy, unspecified: Secondary | ICD-10-CM | POA: Diagnosis not present

## 2023-07-16 DIAGNOSIS — G4733 Obstructive sleep apnea (adult) (pediatric): Secondary | ICD-10-CM | POA: Diagnosis not present

## 2023-07-16 DIAGNOSIS — J471 Bronchiectasis with (acute) exacerbation: Secondary | ICD-10-CM | POA: Diagnosis not present

## 2023-08-01 DIAGNOSIS — H3581 Retinal edema: Secondary | ICD-10-CM | POA: Diagnosis not present

## 2023-08-01 DIAGNOSIS — H30033 Focal chorioretinal inflammation, peripheral, bilateral: Secondary | ICD-10-CM | POA: Diagnosis not present

## 2023-08-01 DIAGNOSIS — Z947 Corneal transplant status: Secondary | ICD-10-CM | POA: Diagnosis not present

## 2023-08-01 DIAGNOSIS — Z961 Presence of intraocular lens: Secondary | ICD-10-CM | POA: Diagnosis not present

## 2023-10-01 NOTE — Progress Notes (Unsigned)
 No chief complaint on file.    HISTORY OF PRESENT ILLNESS:  10/01/23 ALL:  [name] returns for follow up for dysesthesias and tremor. He continues lamotrigine 300mg  and primidone 50mg  BID.   10/03/2022 ALL:  [name] returns for follow up for dysesthesias and tremor. He continues lamotrigine 300mg  and primidone 50mg  BID. He feels dysesthesias are stable. He may have some worsening at times but, overall, symptoms are well managed on lamotrigine. He has noted worsening left hand tremor. Worse after showering and when writing. He has a harder time putting in his contacts. He is tolerating primidone well.   He continues close follow up with cardiology for chronic heart failure. MD aware he is taking lamotrigine. No new cardiac symptoms. BP monitored wirelessly. He feels he is doing well.   10/03/2021 ALL: [name] is a 78 y.o. male here today for follow up for dysesthesias and tremor. He feels that he is doing well.   He continues lamotrigine 300mg  BID. Dysesthesias are stable. He has some pain in both legs and feet but feels it is manageable. No limitations of gait or functioning. He is sleeping well.   He was previously taking atenolol for ET. Cardiology changed med to labetalol for BP management. He was restarted on primidone 50mg  BID (previously not tolerated). He reports tremor is better managed on primidone. He is tolerating well. He had some dizziness for a few days after starting but feels symptoms have resolved. Cardiology has stopped labetalol.    HISTORY (copied from Dr Bonnita Hollow previous note)  [name] is a 78 y.o. 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   Update 09/30/20: He feels his polyneuropathy is stable.    Lamotrigine helps the dysesthesias.    Neurontin and a tricyclic antidepressant did not help the pain. Blood work including B12, cryoglobulins and SPEP/IEF had been essentially normal in the past, last tested in 2013. Nerve  conduction study in the past was mildly abnormal but more recent one showed good functioning of the large fibers.  He sometimes notes dry eyes.       Tremors are slightly worse.    He notes it left > right hand.   He is right handed and this does not bother him much.    He is on atenolol 75 mg po bid.   Primidone was not well tolerated.    We went over options.   Tremor is not bad enough to try a benzo or to refer for DBS.       His gait is the same with a mild reduced balance..   He has had no further episode of presyncope.  He tries to stay active and walks some daily.    He tries to walk 1/4 mile a couple tmes a day but spine hurts more with longer distance.     He is on CPAP and tolerating well.   REVIEW OF SYSTEMS: Out of a complete 14 system review of symptoms, the patient complains only of the following symptoms, tremor, neuropathy and all other reviewed systems are negative.   ALLERGIES: Allergies  Allergen Reactions   Doxycycline Hives and Itching    Hives and itching   Penicillins Swelling    Swelling, rash   Azathioprine Other (See Comments)    Nausea   Mycophenolate Mofetil Cough   Azithromycin Other (See Comments)    Other reaction(s): abdominal pain   Codeine Other (See Comments)     nausea   Sulfa Antibiotics Rash  HOME MEDICATIONS: Outpatient Medications Prior to Visit  Medication Sig Dispense Refill   acetaminophen (TYLENOL) 325 MG tablet Take 650 mg by mouth every 6 (six) hours as needed for mild pain, fever or headache.     allopurinol (ZYLOPRIM) 100 MG tablet Take 150 mg by mouth daily.     Cholecalciferol (D3 2000) 50 MCG (2000 UT) CAPS Take 2,000 Units by mouth daily.     ezetimibe (ZETIA) 10 MG tablet Take 10 mg by mouth daily.     felodipine (PLENDIL) 10 MG 24 hr tablet Take 10 mg by mouth daily.     fexofenadine (ALLEGRA) 60 MG tablet Take 60 mg by mouth as needed for allergies or rhinitis.     Fiber CAPS Take 1 capsule by mouth daily.      fluorometholone (FML) 0.1 % ophthalmic suspension Place 1 drop into both eyes 3 (three) times daily.     furosemide (LASIX) 40 MG tablet TAKE ONE TABLET BY MOUTH ONE TIME DAILY 90 tablet 3   HYDROcodone-acetaminophen (NORCO/VICODIN) 5-325 MG tablet Take 1 tablet by mouth every 8 (eight) hours as needed for up to 5 doses for severe pain (pain score 7-10). 5 tablet 0   ketorolac (ACULAR) 0.5 % ophthalmic solution Place 1 drop into both eyes in the morning, at noon, in the evening, and at bedtime.     lamoTRIgine (LAMICTAL) 150 MG tablet Take 2 tablets (300 mg total) by mouth 2 (two) times daily. 360 tablet 3   losartan (COZAAR) 50 MG tablet TAKE ONE TABLET BY MOUTH ONE TIME DAILY 90 tablet 3   Multiple Vitamin (MULTIVITAMIN) tablet Take 1 tablet by mouth daily.     ondansetron (ZOFRAN) 4 MG tablet Take 1 tablet (4 mg total) by mouth every 6 (six) hours as needed for nausea. 20 tablet 0   ondansetron (ZOFRAN-ODT) 4 MG disintegrating tablet Take 1 tablet (4 mg total) by mouth every 8 (eight) hours as needed for up to 10 doses for refractory nausea / vomiting. 10 tablet 0   pantoprazole (PROTONIX) 40 MG tablet Take 1 tablet (40 mg total) by mouth daily at 6 (six) AM. 60 tablet 0   primidone (MYSOLINE) 50 MG tablet Take 1.5 tablets (75 mg total) by mouth 2 (two) times daily. TAKE ONE TABLET BY MOUTH EVERY MORNING AND TAKE ONE TABLET BY MOUTH EVERY NIGHT AT BEDTIME 270 tablet 3   rosuvastatin (CRESTOR) 20 MG tablet Take 20 mg by mouth at bedtime.     spironolactone (ALDACTONE) 25 MG tablet TAKE ONE TABLET BY MOUTH ONE TIME DAILY 90 tablet 3   zolpidem (AMBIEN) 10 MG tablet Take 5-10 mg by mouth at bedtime as needed for sleep.     No facility-administered medications prior to visit.     PAST MEDICAL HISTORY: Past Medical History:  Diagnosis Date   Cancer (HCC)    Elevated diaphragm    Gout    Heart failure (HCC) 04/05/2021   History of prostate cancer 09/07/1991   Hyperlipidemia    Hypertension     Hypoxemia    Normocytic anemia    OSA (obstructive sleep apnea)    Peripheral neuropathy    Pneumonia    Vision abnormalities      PAST SURGICAL HISTORY: Past Surgical History:  Procedure Laterality Date   APPENDECTOMY     PROSTATECTOMY     SHOULDER SURGERY     bone spur removed from right     FAMILY HISTORY: Family History  Problem Relation Age  of Onset   Emphysema Father    Congestive Heart Failure Mother    Emphysema Brother    Asthma Daughter      SOCIAL HISTORY: Social History   Socioeconomic History   Marital status: Married    Spouse name: Not on file   Number of children: 2   Years of education: Not on file   Highest education level: Not on file  Occupational History   Occupation: Retired    Comment: Optician, dispensing  Tobacco Use   Smoking status: Never   Smokeless tobacco: Never  Vaping Use   Vaping status: Never Used  Substance and Sexual Activity   Alcohol use: Not Currently   Drug use: No   Sexual activity: Not Currently  Other Topics Concern   Not on file  Social History Narrative   Not on file   Social Drivers of Health   Financial Resource Strain: Not on file  Food Insecurity: Not on file  Transportation Needs: Not on file  Physical Activity: Not on file  Stress: Not on file  Social Connections: Not on file  Intimate Partner Violence: Not on file     PHYSICAL EXAM  There were no vitals filed for this visit.   There is no height or weight on file to calculate BMI.  Generalized: Well developed, in no acute distress  Cardiology: normal rate and rhythm, no murmur auscultated  Respiratory: clear to auscultation bilaterally    Neurological examination  Mentation: Alert oriented to time, place, history taking. Follows all commands speech and language fluent Cranial nerve II-XII: Pupils were equal round reactive to light. Extraocular movements were full, visual field were full on confrontational test. Facial sensation and strength  were normal. Head turning and shoulder shrug  were normal and symmetric. Motor: The motor testing reveals 5 over 5 strength of all 4 extremities. Good symmetric motor tone is noted throughout. Action tremor noted of L>R hand.  Sensory: Sensory testing is intact to soft touch on all 4 extremities. No evidence of extinction is noted.  Gait and station: Gait is normal.  Reflexes: Deep tendon reflexes are symmetric and normal bilaterally.    DIAGNOSTIC DATA (LABS, IMAGING, TESTING) - I reviewed patient records, labs, notes, testing and imaging myself where available.  Lab Results  Component Value Date   WBC 10.5 03/15/2021   HGB 11.0 (L) 03/15/2021   HCT 32.0 (L) 03/15/2021   MCV 91.2 03/15/2021   PLT 186 03/15/2021      Component Value Date/Time   NA 137 08/23/2021 1417   K 4.5 08/23/2021 1417   CL 96 08/23/2021 1417   CO2 27 08/23/2021 1417   GLUCOSE 90 08/23/2021 1417   GLUCOSE 101 (H) 03/20/2021 0408   BUN 27 08/23/2021 1417   CREATININE 1.22 08/23/2021 1417   CALCIUM 9.6 08/23/2021 1417   PROT 6.3 (L) 03/11/2021 0537   PROT 6.8 10/03/2020 1335   ALBUMIN 3.0 (L) 03/13/2021 0413   AST 18 03/11/2021 0537   ALT 13 03/11/2021 0537   ALKPHOS 87 03/11/2021 0537   BILITOT 0.9 03/11/2021 0537   GFRNONAA >60 03/20/2021 0408   GFRAA 67 04/13/2020 1104   Lab Results  Component Value Date   CHOL 126 01/20/2011   HDL 51 01/20/2011   LDLCALC 63 01/20/2011   TRIG 60 01/20/2011   CHOLHDL 2.5 01/20/2011   No results found for: "HGBA1C" Lab Results  Component Value Date   VITAMINB12 683 12/14/2014   Lab Results  Component Value Date  TSH 2.255 02/08/2011        No data to display               No data to display           ASSESSMENT AND PLAN  78 y.o. year old male  has a past medical history of Cancer (HCC), Elevated diaphragm, Gout, Heart failure (HCC) (04/05/2021), History of prostate cancer (09/07/1991), Hyperlipidemia, Hypertension, Hypoxemia, Normocytic  anemia, OSA (obstructive sleep apnea), Peripheral neuropathy, Pneumonia, and Vision abnormalities. here with    No diagnosis found.  [name] is doing well, today. We will continue lamotrigine 300mg  and increase primidone to 75mg  twice daily. We will update labs. May consider referral to [facility] if he wishes to pursue procedural based treatment options. He will continues healthy lifestyle habits. Follow up in 1 year.   No orders of the defined types were placed in this encounter.    No orders of the defined types were placed in this encounter.     Shawnie Dapper, MSN, FNP-C 10/01/2023, 9:29 AM  [facility] [address] [place], [place] [address] [phone]

## 2023-10-01 NOTE — Patient Instructions (Incomplete)
 Below is our plan:  We will continue lamotrigine 300mg  and primidone 50mg  twice daily. We will add propranolol 10mg  twice daily. Please keep a close eye on your BP and pulse. Let me know if BP is consistently below 120/80 or if pulse is lower than 60.   Please make sure you are staying well hydrated. I recommend 50-60 ounces daily. Well balanced diet and regular exercise encouraged. Consistent sleep schedule with 6-8 hours recommended.   Please continue follow up with care team as directed.   Follow up with Dr Epimenio Foot in 6 months   You may receive a survey regarding today's visit. I encourage you to leave honest feed back as I do use this information to improve patient care. Thank you for seeing me today!

## 2023-10-02 ENCOUNTER — Ambulatory Visit (INDEPENDENT_AMBULATORY_CARE_PROVIDER_SITE_OTHER): Payer: Medicare Other | Admitting: Family Medicine

## 2023-10-02 ENCOUNTER — Encounter: Payer: Self-pay | Admitting: Family Medicine

## 2023-10-02 VITALS — BP 146/88 | HR 61 | Ht 71.0 in | Wt 190.0 lb

## 2023-10-02 DIAGNOSIS — R208 Other disturbances of skin sensation: Secondary | ICD-10-CM | POA: Diagnosis not present

## 2023-10-02 DIAGNOSIS — G629 Polyneuropathy, unspecified: Secondary | ICD-10-CM | POA: Diagnosis not present

## 2023-10-02 DIAGNOSIS — G250 Essential tremor: Secondary | ICD-10-CM

## 2023-10-02 MED ORDER — PROPRANOLOL HCL 10 MG PO TABS: 10.0000 mg | ORAL_TABLET | Freq: Two times a day (BID) | ORAL | 3 refills | Status: DC

## 2023-10-02 MED ORDER — PRIMIDONE 50 MG PO TABS: 75.0000 mg | ORAL_TABLET | Freq: Two times a day (BID) | ORAL | 1 refills | Status: AC

## 2023-10-02 MED ORDER — LAMOTRIGINE 150 MG PO TABS: 300.0000 mg | ORAL_TABLET | Freq: Two times a day (BID) | ORAL | 3 refills | Status: AC

## 2023-10-04 LAB — LAMOTRIGINE LEVEL: Lamotrigine Lvl: 16 ug/mL (ref 2.0–20.0)

## 2023-10-04 LAB — PRIMIDONE, SERUM
Phenobarbital, Serum: 6 ug/mL — ABNORMAL LOW (ref 15–40)
Primidone Lvl: 5.1 ug/mL (ref 5.0–12.0)

## 2023-10-07 ENCOUNTER — Encounter: Payer: Self-pay | Admitting: Family Medicine

## 2024-02-17 ENCOUNTER — Encounter: Payer: Self-pay | Admitting: Cardiology

## 2024-02-19 ENCOUNTER — Encounter: Payer: Self-pay | Admitting: Cardiology

## 2024-02-19 ENCOUNTER — Ambulatory Visit: Attending: Cardiology | Admitting: Cardiology

## 2024-02-19 VITALS — BP 132/82

## 2024-02-19 DIAGNOSIS — I10 Essential (primary) hypertension: Secondary | ICD-10-CM

## 2024-02-19 DIAGNOSIS — E782 Mixed hyperlipidemia: Secondary | ICD-10-CM

## 2024-02-19 NOTE — Progress Notes (Unsigned)
  Cardiology Office Note:  .   Date:  02/19/2024  ID:  [name], DOB Jan 08, 1946, MRN [id number] PCP: Clarice Nottingham, MD  [facility] Providers Cardiologist:  Newman Lawrence, MD PCP: Clarice Nottingham, MD  No chief complaint on file.     History of Present Illness: .    [name] is a 78 y.o. male with hypertension, h/o cancer, peripheral neuropathy, HFpEF  Patient is doing well.  He recently underwent shoulder surgery and is currently undergoing rehab.  Denies any chest pain, shortness of breath symptoms.  Blood pressure is elevated today.  However, historically, blood pressure has been elevated office visits and typically much lower at home.  He has upcoming follow-up with Dr. Clarice in December 2024.  Denies any orthopnea, PND, leg symptoms.    There were no vitals filed for this visit.    ROS:  Review of Systems  Cardiovascular:  Negative for chest pain, dyspnea on exertion, leg swelling, palpitations and syncope.     Studies Reviewed: SABRA       EKG 05/13/2023: Unusual P axis, possible ectopic atrial rhythm with Premature atrial complexes with Abberant conduction When compared with ECG of 09-Mar-2021 20:10, No significant change since    Labs 07/2023: Chol 137, TG 178, HDL 47, LDL 60 Hb 11 Cr 1.2 ***  Echocardiogram 04/2021:  Study Quality: Technically difficult study.  Normal LV systolic function with visual EF 50-55%. Left ventricle cavity  is normal in size. Severe left ventricular hypertrophy. Normal global wall  motion. Doppler evidence of grade II (pseudonormal) diastolic dysfunction,  indeterminate LAP.  Left atrial cavity is mildly dilated.  Trace aortic regurgitation.  Mild (Grade I) mitral regurgitation.  No evidence of pulmonary hypertension.  Compared to study 03/04/2020: LVEF was 60-65% now 50-55%, LAE was moderate  now mild, otherwise no significant change.   Physical Exam:   Physical Exam Vitals and nursing note reviewed.   Constitutional:      General: He is not in acute distress. Neck:     Vascular: No JVD.  Cardiovascular:     Rate and Rhythm: Normal rate and regular rhythm.     Heart sounds: Normal heart sounds. No murmur heard. Pulmonary:     Effort: Pulmonary effort is normal.     Breath sounds: Normal breath sounds. No wheezing or rales.  Musculoskeletal:     Right lower leg: No edema.     Left lower leg: No edema.      VISIT DIAGNOSES: No diagnosis found.    ASSESSMENT AND PLAN: .    [name] is a 78 y.o. male with hypertension, h/o cancer, peripheral neuropathy, HFpEF   HFpEF: Normal EF, grade III diastolic function at baseline with normal strain. Low suspicion for infiltrative cardiomyopathy. 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  Continue losartan  50 mg, spironolactone  25 mg daily, In future, should he have recurrent symptoms of heart failure, will add SGLT2i such as Jardiance. At present, he is euvolemic and stable from cardiac standpoint.  I have refilled losartan  and spironolactone  today by Dr. Forte for future refills, and I will see him as needed.  Patient's neurologist wants to use propranolol  for essential tremors, okay with me.  Hypertension: White coat hypertension, otherwise well controlled.    Mixed hyperlipidemia: Well controlled. LDL 57   F/u as needed  Signed, [name], MD

## 2024-02-19 NOTE — Patient Instructions (Signed)
 Medication Instructions:  The current medical regimen is effective;  continue present plan and medications.  *If you need a refill on your cardiac medications before your next appointment, please call your pharmacy*  Lab Work: NONE If you have labs (blood work) drawn today and your tests are completely normal, you will receive your results only by: MyChart Message (if you have MyChart) OR A paper copy in the mail If you have any lab test that is abnormal or we need to change your treatment, we will call you to review the results.  Testing/Procedures: NONE  Follow-Up: At [facility], you and your health needs are our priority.  As part of our continuing mission to provide you with exceptional heart care, our providers are all part of one team.  This team includes your primary Cardiologist (physician) and Advanced Practice Providers or APPs (Physician Assistants and Nurse Practitioners) who all work together to provide you with the care you need, when you need it.  Your next appointment:   4-6 weeks  Provider:   [name], MD or One of our Advanced Practice Providers (APPs): Morse Clause, PA-C  Lamarr Satterfield, NP Miriam Shams, NP  Olivia Pavy, PA-C Josefa Beauvais, NP  Leontine Salen, PA-C Orren Fabry, PA-C  [place], PA-C Ernest Dick, NP  Damien Braver, NP Jon Hails, PA-C  Waddell Donath, PA-C    Dayna Dunn, PA-C  Glendia Ferrier, PA-C Lum Louis, NP Katlyn West, NP Callie Goodrich, PA-C  Evan Williams, PA-C Sheng Haley, PA-C  Xika Zhao, NP Kathleen Johnson, PA-C

## 2024-02-20 ENCOUNTER — Encounter: Payer: Self-pay | Admitting: Cardiology

## 2024-03-12 ENCOUNTER — Telehealth: Payer: Self-pay | Admitting: Cardiology

## 2024-03-12 ENCOUNTER — Encounter: Payer: Self-pay | Admitting: Cardiology

## 2024-03-12 NOTE — Telephone Encounter (Signed)
 Wife called office with same information. Please advise.

## 2024-03-12 NOTE — Telephone Encounter (Signed)
 Error

## 2024-03-23 ENCOUNTER — Other Ambulatory Visit (HOSPITAL_COMMUNITY): Payer: Self-pay

## 2024-03-23 ENCOUNTER — Encounter: Payer: Self-pay | Admitting: Cardiology

## 2024-03-23 ENCOUNTER — Ambulatory Visit: Attending: Cardiology | Admitting: Cardiology

## 2024-03-23 VITALS — BP 138/80 | HR 68 | Ht 70.0 in | Wt 191.0 lb

## 2024-03-23 DIAGNOSIS — I480 Paroxysmal atrial fibrillation: Secondary | ICD-10-CM | POA: Diagnosis not present

## 2024-03-23 DIAGNOSIS — E782 Mixed hyperlipidemia: Secondary | ICD-10-CM | POA: Diagnosis not present

## 2024-03-23 DIAGNOSIS — I10 Essential (primary) hypertension: Secondary | ICD-10-CM | POA: Diagnosis not present

## 2024-03-23 DIAGNOSIS — I5032 Chronic diastolic (congestive) heart failure: Secondary | ICD-10-CM | POA: Diagnosis not present

## 2024-03-23 MED ORDER — APIXABAN 5 MG PO TABS
5.0000 mg | ORAL_TABLET | Freq: Two times a day (BID) | ORAL | 3 refills | Status: DC
  Filled 2024-03-23: qty 60, 30d supply, fill #0
  Filled 2024-04-17: qty 60, 30d supply, fill #1
  Filled 2024-05-18: qty 60, 30d supply, fill #2
  Filled 2024-06-20 – 2024-06-22 (×2): qty 60, 30d supply, fill #3

## 2024-03-23 MED ORDER — FUROSEMIDE 40 MG PO TABS: 40.0000 mg | ORAL_TABLET | Freq: Every day | ORAL | Status: AC | PRN

## 2024-03-23 NOTE — Addendum Note (Signed)
 Addended by: MANDA LYLE NOVAK on: 03/23/2024 06:31 PM   Modules accepted: Orders

## 2024-03-23 NOTE — Progress Notes (Signed)
 Cardiology Office Note:  .   Date:  03/23/2024  ID:  KRON EVERTON, DOB Dec 21, 1945, MRN [id number] PCP: Clarice Nottingham, MD  [facility] Providers Cardiologist:  Newman Lawrence, MD PCP: Clarice Nottingham, MD  Chief Complaint  Patient presents with   Essential hypertension      History of Present Illness: .    JUJUAN DUGO is a 78 y.o. male with hypertension, h/o prostate cancer, peripheral neuropathy, HFpEF, essential tremor  Average blood pressure 132/88 mmHg.  No symptoms of chest pain, shortness of breath, palpitations.  Dizziness has improved.  Vitals:   03/23/24 1123  BP: 138/80  Pulse: 68  SpO2: 95%       ROS:  Review of Systems  Cardiovascular:  Negative for chest pain, dyspnea on exertion, leg swelling, palpitations and syncope.     Studies Reviewed: SABRA   EKG Interpretation Date/Time:  Monday March 23 2024 11:26:01 EDT Ventricular Rate:  68 PR Interval:    QRS Duration:  90 QT Interval:  408 QTC Calculation: 433 R Axis:   25  Text Interpretation: EKG 03/23/2024: Atrial fibrillation 68 bpm When compared with ECG of 13-May-2023 13:07, Atrial fibrillation has replaced Ectopic atrial rhythm Confirmed by Lawrence Newman [phone]) on 03/23/2024 11:27:40 AM   EKG 03/23/2024: Atrial fibrillation 68 bpm When compared with ECG of 13-May-2023 13:07, Atrial fibrillation has replaced Ectopic atrial rhythm   Labs 07/2023: Chol 137, TG 178, HDL 47, LDL 60 Hb 11 Cr 1.2   Echocardiogram 04/2021:  Study Quality: Technically difficult study.  Normal LV systolic function with visual EF 50-55%. Left ventricle cavity  is normal in size. Severe left ventricular hypertrophy. Normal global wall  motion. Doppler evidence of grade II (pseudonormal) diastolic dysfunction,  indeterminate LAP.  Left atrial cavity is mildly dilated.  Trace aortic regurgitation.  Mild (Grade I) mitral regurgitation.  No evidence of pulmonary hypertension.  Compared to study  03/04/2020: LVEF was 60-65% now 50-55%, LAE was moderate  now mild, otherwise no significant change.   Physical Exam:   Physical Exam Vitals and nursing note reviewed.  Constitutional:      General: He is not in acute distress. Neck:     Vascular: No JVD.  Cardiovascular:     Rate and Rhythm: Normal rate and regular rhythm.     Heart sounds: Normal heart sounds. No murmur heard. Pulmonary:     Effort: Pulmonary effort is normal.     Breath sounds: Normal breath sounds. No wheezing or rales.  Musculoskeletal:     Right lower leg: No edema.     Left lower leg: No edema.    Risk Assessment/Calculations:    CHA2DS2-VASc Score = 3   This indicates a 3.2% annual risk of stroke. The patient's score is based upon: CHF History: 0 HTN History: 1 Diabetes History: 0 Stroke History: 0 Vascular Disease History: 0 Age Score: 2 Gender Score: 0   VISIT DIAGNOSES:   ICD-10-CM   1. Paroxysmal atrial fibrillation (HCC)  I48.0 Basic metabolic panel with GFR    ECHOCARDIOGRAM COMPLETE    2. Essential hypertension  I10 EKG 12-Lead    Basic metabolic panel with GFR    3. Mixed hyperlipidemia  E78.2 EKG 12-Lead    4. Chronic heart failure with preserved ejection fraction (HCC)  I50.32 Pro b natriuretic peptide (BNP)         ASSESSMENT AND PLAN: .    JOVANNI RASH is a 78 y.o. male with hypertension, h/o prostate  cancer, peripheral neuropathy, HFpEF, essential tremor, new diagnosis A-fib 03/2024  A-fib: New diagnosis, no symptoms.  Chronicity unclear.  Will call paroxysmal at this time. Rate fairly well-controlled. Obtain echocardiogram, started Eliquis  5 mg twice daily.  Will check BMP today to confirm that creatinine is <1.5. Follow-up in 4 weeks.  If he remains in A-fib, could consider cardioversion.  Following cardioversion, recommend 30-day monitor to assess A-fib burden and recurrence.  Hypertension: Well-controlled today.  No new antihypertensive medications  added. Currently takes Lasix  40 mg daily.  Can change to as needed.  HFpEF: Previously noted to have grade 3 diastolic 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 any significant suspicion for infiltrative cardiomyopathy.  He remains euvolemic and relatively asymptomatic from a HFpEF standpoint. If he has any recurrence of heart failure symptoms, will consider SGLT2 inhibitor.   Mixed hyperlipidemia: Well controlled.   F/u in 4 weeks  Signed, [name], MD

## 2024-03-23 NOTE — Patient Instructions (Addendum)
 Medication Instructions:  START Eliquis  5 mg twice daily   CHANGE Lasix  to as needed   *If you need a refill on your cardiac medications before your next appointment, please call your pharmacy*  Lab Work: Bmp probnp  If you have labs (blood work) drawn today and your tests are completely normal, you will receive your results only by: MyChart Message (if you have MyChart) OR A paper copy in the mail If you have any lab test that is abnormal or we need to change your treatment, we will call you to review the results.  Testing/Procedures: Echo (need before next appointment)  Your physician has requested that you have an echocardiogram. Echocardiography is a painless test that uses sound waves to create images of your heart. It provides your doctor with information about the size and shape of your heart and how well your heart's chambers and valves are working. This procedure takes approximately one hour. There are no restrictions for this procedure. Please do NOT wear cologne, perfume, aftershave, or lotions (deodorant is allowed). Please arrive 15 minutes prior to your appointment time.  Please note: We ask at that you not bring children with you during ultrasound (echo/ vascular) testing. Due to room size and safety concerns, children are not allowed in the ultrasound rooms during exams. Our front office staff cannot provide observation of children in our lobby area while testing is being conducted. An adult accompanying a patient to their appointment will only be allowed in the ultrasound room at the discretion of the ultrasound technician under special circumstances. We apologize for any inconvenience.   Follow-Up: At [facility], you and your health needs are our priority.  As part of our continuing mission to provide you with exceptional heart care, our providers are all part of one team.  This team includes your primary Cardiologist (physician) and Advanced Practice Providers  or APPs (Physician Assistants and Nurse Practitioners) who all work together to provide you with the care you need, when you need it.  Your next appointment:   4 week(s)  Provider:   [name], MD

## 2024-03-24 LAB — BASIC METABOLIC PANEL WITH GFR
BUN/Creatinine Ratio: 21 (ref 10–24)
BUN: 28 mg/dL — ABNORMAL HIGH (ref 8–27)
CO2: 26 mmol/L (ref 20–29)
Calcium: 9.7 mg/dL (ref 8.6–10.2)
Chloride: 99 mmol/L (ref 96–106)
Creatinine, Ser: 1.36 mg/dL — ABNORMAL HIGH (ref 0.76–1.27)
Glucose: 81 mg/dL (ref 70–99)
Potassium: 4.6 mmol/L (ref 3.5–5.2)
Sodium: 141 mmol/L (ref 134–144)
eGFR: 53 mL/min/1.73 — ABNORMAL LOW (ref >59)

## 2024-03-24 LAB — PRO B NATRIURETIC PEPTIDE: NT-Pro BNP: 277 pg/mL (ref 0–486)

## 2024-03-25 ENCOUNTER — Ambulatory Visit: Payer: Self-pay

## 2024-03-26 ENCOUNTER — Ambulatory Visit: Admitting: Cardiology

## 2024-03-27 ENCOUNTER — Ambulatory Visit: Admitting: Cardiology

## 2024-04-06 ENCOUNTER — Encounter: Payer: Self-pay | Admitting: Cardiology

## 2024-04-06 DIAGNOSIS — I10 Essential (primary) hypertension: Secondary | ICD-10-CM

## 2024-04-08 NOTE — Telephone Encounter (Signed)
 IF BP is consistently staying elevated, recommend increasing losartan  to 100 mg daily. Check BMP in 1 week (so it should be resulted before office viisit on 9/15). Recommend liberal fluid intake to avoid increase in creatinine.   Thanks MJP

## 2024-04-09 ENCOUNTER — Ambulatory Visit (HOSPITAL_COMMUNITY)
Admission: RE | Admit: 2024-04-09 | Discharge: 2024-04-09 | Disposition: A | Discharge status: Home / Self Care | Source: Ambulatory Visit | Attending: Cardiology | Admitting: Cardiology

## 2024-04-09 ENCOUNTER — Other Ambulatory Visit (HOSPITAL_COMMUNITY): Payer: Self-pay

## 2024-04-09 DIAGNOSIS — I480 Paroxysmal atrial fibrillation: Secondary | ICD-10-CM | POA: Diagnosis present

## 2024-04-09 MED ORDER — LOSARTAN POTASSIUM 100 MG PO TABS: 100.0000 mg | ORAL_TABLET | Freq: Every day | ORAL | 1 refills | Status: AC

## 2024-04-09 MED ORDER — FLUZONE HIGH-DOSE 0.5 ML IM SUSY
0.5000 mL | PREFILLED_SYRINGE | Freq: Once | INTRAMUSCULAR | 0 refills | Status: AC
Start: 2024-04-09 — End: 2024-04-10
  Filled 2024-04-09: qty 0.5, 1d supply, fill #0

## 2024-04-12 LAB — ECHOCARDIOGRAM COMPLETE
AR max vel: 2.07 cm2
AV Area VTI: 2.2 cm2
AV Area mean vel: 1.92 cm2
AV Mean grad: 3 mmHg
AV Peak grad: 6.2 mmHg
Ao pk vel: 1.24 m/s
Area-P 1/2: 6.27 cm2

## 2024-04-13 NOTE — Progress Notes (Signed)
 Cardiology Office Note:  .   Date:  04/13/2024  ID:  [name], DOB [birth year], MRN [id number] PCP: Glenn Nottingham, MD  [facility] Providers Cardiologist:  Glenn Lawrence, MD PCP: Glenn Nottingham, MD  Chief Complaint  Patient presents with   Paroxysmal atrial fibrillation      History of Present Illness: .    [name] is a 78 y.o. male with hypertension, h/o prostate cancer, peripheral neuropathy, HFpEF, essential tremor  Patient denies any chest pain, shortness of breath, palpitation symptoms.  Blood pressure remains elevated.  Vitals:   04/20/24 1120  BP: 124/88  Pulse: 73  SpO2: 99%        ROS:  Review of Systems  Cardiovascular:  Negative for chest pain, dyspnea on exertion, leg swelling, palpitations and syncope.     Studies Reviewed: SABRA       EKG 04/20/2024: Atrial fibrillation 72 bpm When compared with ECG of 23-Mar-2024 11:26, No significant change was found    Echocardiogram 04/2024:  1. Left ventricular ejection fraction, by estimation, is 60 to 65%. The  left ventricle has normal function. The left ventricle has no regional  wall motion abnormalities. Left ventricular diastolic function could not  be evaluated.   2. Right ventricular systolic function is normal. The right ventricular  size is normal. There is normal pulmonary artery systolic pressure.   3. Left atrial size was moderately dilated.   4. The mitral valve is normal in structure. Mild mitral valve  regurgitation. No evidence of mitral stenosis.   5. The aortic valve is tricuspid. There is mild calcification of the  aortic valve. Aortic valve regurgitation is trivial. No aortic stenosis is  present.   6. The inferior vena cava is normal in size with greater than 50%  respiratory variability, suggesting right atrial pressure of 3 mmHg.   Comparison(s): Prior images unable to be directly viewed, comparison made  by report only.   Conclusion(s)/Recommendation(s):  Otherwise normal echocardiogram, with  minor abnormalities described in the report. Technically limited study,  poor parasternal and apical windows. In afib throughout study.   Labs 04/2024: Cr 1.17, eGFR 63, K 5.0 CK 56    Labs 07/2023: Chol 137, TG 178, HDL 47, LDL 60 Hb 11 Cr 1.2   Echocardiogram 04/2024:   1. Left ventricular ejection fraction, by estimation, is 60 to 65%. The left ventricle has normal function. The left ventricle has no regional wall motion abnormalities. Left ventricular diastolic function could not be evaluated.  2. Right ventricular systolic function is normal. The right ventricular size is normal. There is normal pulmonary artery systolic pressure.  3. Left atrial size was moderately dilated.  4. The mitral valve is normal in structure. Mild mitral valve regurgitation. No evidence of mitral stenosis.  5. The aortic valve is tricuspid. There is mild calcification of the aortic valve. Aortic valve regurgitation is trivial. No aortic stenosis is present.  6. The inferior vena cava is normal in size with greater than 50% respiratory variability, suggesting right atrial pressure of 3 mmHg.   Comparison(s): Prior images unable to be directly viewed, comparison made by report only.   Conclusion(s)/Recommendation(s): Otherwise normal echocardiogram, with minor abnormalities described in the report. Technically limited study, poor parasternal and apical windows. In afib throughout study.    Physical Exam:   Physical Exam Vitals and nursing note reviewed.  Constitutional:      General: He is not in acute distress. Neck:     Vascular:  No JVD.  Cardiovascular:     Rate and Rhythm: Normal rate. Rhythm irregular.     Heart sounds: Normal heart sounds. No murmur heard. Pulmonary:     Effort: Pulmonary effort is normal.     Breath sounds: Normal breath sounds. No wheezing or rales.  Musculoskeletal:     Right lower leg: No edema.     Left lower leg: No edema.     Risk Assessment/Calculations:    CHA2DS2-VASc Score = 3   This indicates a 3.2% annual risk of stroke. The patient's score is based upon: CHF History: 0 HTN History: 1 Diabetes History: 0 Stroke History: 0 Vascular Disease History: 0 Age Score: 2 Gender Score: 0   VISIT DIAGNOSES:   ICD-10-CM   1. Paroxysmal atrial fibrillation (HCC)  I48.0 EKG 12-Lead    CBC    Basic metabolic panel with GFR    2. Essential hypertension  I10           ASSESSMENT AND PLAN: .    [name] is a 78 y.o. male with hypertension, h/o prostate cancer, peripheral neuropathy, HFpEF, essential tremor, new diagnosis A-fib 03/2024  Persistent atrial fibrillation: Controlled rate.  Moderate left atrial enlargement, normal EF on echocardiogram.  It is reasonableto consider cardioversion at least once to restore sinus rhythm.  Check CBC, BMP, scheduled for cardioversion.  He has been on at least 4 weeks of anticoagulation.  Emphasize importance of compliance.  I would recommend 30-day monitor after cardioversion to evaluate for any A-fib recurrence.  Hypertension: Losartan  dose recently increased from 50 mg daily to 100 mg daily. Need continued monitoring and additional agents, as needed.     F/u after cardioversion   Signed, Glenn JINNY Lawrence, MD

## 2024-04-13 NOTE — H&P (View-Only) (Signed)
 Cardiology Office Note:  .   Date:  04/13/2024  ID:  [name], DOB [birth year], MRN [id number] PCP: Clarice Nottingham, MD  [facility] Providers Cardiologist:  Newman Lawrence, MD PCP: Clarice Nottingham, MD  Chief Complaint  Patient presents with   Paroxysmal atrial fibrillation      History of Present Illness: .    [name] is a 78 y.o. male with hypertension, h/o prostate cancer, peripheral neuropathy, HFpEF, essential tremor  Patient denies any chest pain, shortness of breath, palpitation symptoms.  Blood pressure remains elevated.  Vitals:   04/20/24 1120  BP: 124/88  Pulse: 73  SpO2: 99%        ROS:  Review of Systems  Cardiovascular:  Negative for chest pain, dyspnea on exertion, leg swelling, palpitations and syncope.     Studies Reviewed: [name]       EKG 04/20/2024: Atrial fibrillation 72 bpm When compared with ECG of 23-Mar-2024 11:26, No significant change was found    Echocardiogram 04/2024:  1. Left ventricular ejection fraction, by estimation, is 60 to 65%. The  left ventricle has normal function. The left ventricle has no regional  wall motion abnormalities. Left ventricular diastolic function could not  be evaluated.   2. Right ventricular systolic function is normal. The right ventricular  size is normal. There is normal pulmonary artery systolic pressure.   3. Left atrial size was moderately dilated.   4. The mitral valve is normal in structure. Mild mitral valve  regurgitation. No evidence of mitral stenosis.   5. The aortic valve is tricuspid. There is mild calcification of the  aortic valve. Aortic valve regurgitation is trivial. No aortic stenosis is  present.   6. The inferior vena cava is normal in size with greater than 50%  respiratory variability, suggesting right atrial pressure of 3 mmHg.   Comparison(s): Prior images unable to be directly viewed, comparison made  by report only.   Conclusion(s)/Recommendation(s):  Otherwise normal echocardiogram, with  minor abnormalities described in the report. Technically limited study,  poor parasternal and apical windows. In afib throughout study.   Labs 04/2024: Cr 1.17, eGFR 63, K 5.0 CK 56    Labs 07/2023: Chol 137, TG 178, HDL 47, LDL 60 Hb 11 Cr 1.2   Echocardiogram 04/2024:   1. Left ventricular ejection fraction, by estimation, is 60 to 65%. The left ventricle has normal function. The left ventricle has no regional wall motion abnormalities. Left ventricular diastolic function could not be evaluated.  2. Right ventricular systolic function is normal. The right ventricular size is normal. There is normal pulmonary artery systolic pressure.  3. Left atrial size was moderately dilated.  4. The mitral valve is normal in structure. Mild mitral valve regurgitation. No evidence of mitral stenosis.  5. The aortic valve is tricuspid. There is mild calcification of the aortic valve. Aortic valve regurgitation is trivial. No aortic stenosis is present.  6. The inferior vena cava is normal in size with greater than 50% respiratory variability, suggesting right atrial pressure of 3 mmHg.   Comparison(s): Prior images unable to be directly viewed, comparison made by report only.   Conclusion(s)/Recommendation(s): Otherwise normal echocardiogram, with minor abnormalities described in the report. Technically limited study, poor parasternal and apical windows. In afib throughout study.    Physical Exam:   Physical Exam Vitals and nursing note reviewed.  Constitutional:      General: He is not in acute distress. Neck:     Vascular:  No JVD.  Cardiovascular:     Rate and Rhythm: Normal rate. Rhythm irregular.     Heart sounds: Normal heart sounds. No murmur heard. Pulmonary:     Effort: Pulmonary effort is normal.     Breath sounds: Normal breath sounds. No wheezing or rales.  Musculoskeletal:     Right lower leg: No edema.     Left lower leg: No edema.     Risk Assessment/Calculations:    CHA2DS2-VASc Score = 3   This indicates a 3.2% annual risk of stroke. The patient's score is based upon: CHF History: 0 HTN History: 1 Diabetes History: 0 Stroke History: 0 Vascular Disease History: 0 Age Score: 2 Gender Score: 0   VISIT DIAGNOSES:   ICD-10-CM   1. Paroxysmal atrial fibrillation (HCC)  I48.0 EKG 12-Lead    CBC    Basic metabolic panel with GFR    2. Essential hypertension  I10           ASSESSMENT AND PLAN: .    [name] is a 78 y.o. male with hypertension, h/o prostate cancer, peripheral neuropathy, HFpEF, essential tremor, new diagnosis A-fib 03/2024  Persistent atrial fibrillation: Controlled rate.  Moderate left atrial enlargement, normal EF on echocardiogram.  It is reasonableto consider cardioversion at least once to restore sinus rhythm.  Check CBC, BMP, scheduled for cardioversion.  He has been on at least 4 weeks of anticoagulation.  Emphasize importance of compliance.  I would recommend 30-day monitor after cardioversion to evaluate for any A-fib recurrence.  Hypertension: Losartan  dose recently increased from 50 mg daily to 100 mg daily. Need continued monitoring and additional agents, as needed.     F/u after cardioversion   Signed, [name], MD

## 2024-04-15 ENCOUNTER — Ambulatory Visit (INDEPENDENT_AMBULATORY_CARE_PROVIDER_SITE_OTHER): Payer: Medicare Other | Admitting: Neurology

## 2024-04-15 ENCOUNTER — Encounter: Payer: Self-pay | Admitting: Neurology

## 2024-04-15 VITALS — BP 166/89 | HR 74 | Ht 71.0 in | Wt 194.0 lb

## 2024-04-15 DIAGNOSIS — G629 Polyneuropathy, unspecified: Secondary | ICD-10-CM

## 2024-04-15 DIAGNOSIS — M791 Myalgia, unspecified site: Secondary | ICD-10-CM

## 2024-04-15 DIAGNOSIS — G250 Essential tremor: Secondary | ICD-10-CM | POA: Diagnosis not present

## 2024-04-15 DIAGNOSIS — R208 Other disturbances of skin sensation: Secondary | ICD-10-CM

## 2024-04-15 MED ORDER — TRAMADOL HCL 50 MG PO TABS: ORAL_TABLET | ORAL | 1 refills | Status: DC

## 2024-04-15 NOTE — Progress Notes (Signed)
 [facility]  PATIENT: [name] DOB: Dec 15, 1945  REFERRING CLINICIAN: Ryan Hives HISTORY FROM: Patient REASON FOR VISIT: Polyneuropathy and tremors   HISTORICAL  CHIEF COMPLAINT:  Chief Complaint  Patient presents with   Follow-up    Pt in room 11. Wife in room. Here for Polyneuropathy follow up. Pt states neuropathy pain has worsened., no falls. Pt said tremors has gotten better.    HISTORY OF PRESENT ILLNESS:  [name] is a 78 y.o. 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  Update 04/15/2024 He feels his polyneuropathy pain is worse.  The worst pain is actually in the thighs not in the feet.   Sometimes this is intense 8/10 pain.    We discussed would not be typical for neuropathy.   No tenderness.  He has been on Crestor many years.   CK was normal in 2016.  Lamotrigine  helps the dysesthesias but is on maximum dose of 300 mg po bid..    Neurontin and a tricyclic antidepressant did not help the pain. Blood work including B12, cryoglobulins and SPEP/IEF had been essentially normal in the past, last tested in 2013.   Nerve conduction study at symptom onset was mildly abnormal but 2016 study showed good functioning of the large fibers.  He has good DTRs.   MRI cervical spine was normal for age - spinal cord normal, mild DJD no spinal stenosis.   Tremors are doing better on primidone  75 mg po bid and propranolol  10 mg po bid. Tremor is worse on the left than right  His gait is similar to last year with a mild reduced balance  Gait has slowed.   He has had no further episode of presyncope.  He tries to stay active and walks some daily.    He tries to walk 1/4 mile a couple tmes a day but spine hurts more with longer distance.        REVIEW OF SYSTEMS:  Constitutional: No fevers, chills, sweats, or change in appetite Eyes: No visual changes, double vision, eye pain Ear, nose and throat: No hearing loss, ear pain, nasal  congestion, sore throat Cardiovascular: No chest pain, palpitations Respiratory:  No shortness of breath at rest or with exertion.   No wheezes   He has OSA and is on CPAP GastrointestinaI: No nausea, vomiting, diarrhea, abdominal pain, fecal incontinence Genitourinary:  No dysuria, urinary retention or frequency.  No nocturia. Musculoskeletal:  No neck pain, back pain Integumentary: No rash, pruritus, skin lesions Neurological: as above Psychiatric: No depression at this time.  No anxiety Endocrine: No palpitations, diaphoresis, change in appetite, change in weigh or increased thirst Hematologic/Lymphatic:  No anemia, purpura, petechiae. Allergic/Immunologic: No itchy/runny eyes, nasal congestion, recent allergic reactions, rashes  ALLERGIES: Allergies  Allergen Reactions   Doxycycline  Hives and Itching    Hives and itching   Penicillins Swelling    Swelling, rash   Azathioprine Other (See Comments)    Nausea   Mycophenolate Mofetil Cough   Azithromycin  Other (See Comments)    Other reaction(s): abdominal pain   Codeine Other (See Comments)     nausea   Sulfa Antibiotics Rash    HOME MEDICATIONS: Outpatient Medications Prior to Visit  Medication Sig Dispense Refill   acetaminophen  (TYLENOL ) 325 MG tablet Take 650 mg by mouth every 6 (six) hours as needed for mild pain, fever or headache.     allopurinol (ZYLOPRIM) 100 MG tablet Take 150 mg by mouth daily.  apixaban  (ELIQUIS ) 5 MG TABS tablet Take 1 tablet (5 mg total) by mouth 2 (two) times daily. 60 tablet 3   Cholecalciferol (D3 2000) 50 MCG (2000 UT) CAPS Take 2,000 Units by mouth daily.     ezetimibe (ZETIA) 10 MG tablet Take 10 mg by mouth daily.     famotidine  (PEPCID ) 20 MG tablet Take 20 mg by mouth daily.     felodipine  (PLENDIL ) 10 MG 24 hr tablet Take 10 mg by mouth daily.     fexofenadine (ALLEGRA) 60 MG tablet Take 60 mg by mouth as needed for allergies or rhinitis.     fluorometholone (FML) 0.1 % ophthalmic  suspension Place 1 drop into both eyes 3 (three) times daily.     furosemide  (LASIX ) 40 MG tablet Take 1 tablet (40 mg total) by mouth daily as needed.     ketorolac  (ACULAR ) 0.5 % ophthalmic solution Place 1 drop into both eyes in the morning, at noon, in the evening, and at bedtime.     lamoTRIgine  (LAMICTAL ) 150 MG tablet Take 2 tablets (300 mg total) by mouth 2 (two) times daily. 360 tablet 3   losartan  (COZAAR ) 100 MG tablet Take 1 tablet (100 mg total) by mouth daily. 90 tablet 1   Multiple Vitamin (MULTIVITAMIN) tablet Take 1 tablet by mouth daily.     primidone  (MYSOLINE ) 50 MG tablet Take 1.5 tablets (75 mg total) by mouth 2 (two) times daily. TAKE ONE TABLET BY MOUTH EVERY MORNING AND TAKE ONE TABLET BY MOUTH EVERY NIGHT AT BEDTIME (Patient taking differently: Take 75 mg by mouth 2 (two) times daily. TAKE ONE TABLET BY MOUTH EVERY MORNING AND TAKE ONE TABLET BY MOUTH EVERY NIGHT AT BEDTIME  Patient takes 50mg ) 270 tablet 1   propranolol  (INDERAL ) 10 MG tablet Take 1 tablet (10 mg total) by mouth 2 (two) times daily. 180 tablet 3   rosuvastatin (CRESTOR) 20 MG tablet Take 20 mg by mouth at bedtime.     spironolactone  (ALDACTONE ) 25 MG tablet TAKE ONE TABLET BY MOUTH ONE TIME DAILY 90 tablet 3   zolpidem  (AMBIEN ) 10 MG tablet Take 5-10 mg by mouth at bedtime as needed for sleep.     No facility-administered medications prior to visit.    PAST MEDICAL HISTORY: Past Medical History:  Diagnosis Date   Afib (HCC)    Cancer (HCC)    Elevated diaphragm    Gout    Heart failure (HCC) 04/05/2021   History of prostate cancer 09/07/1991   Hyperlipidemia    Hypertension    Hypoxemia    Normocytic anemia    OSA (obstructive sleep apnea)    Peripheral neuropathy    Pneumonia    Vision abnormalities     PAST SURGICAL HISTORY: Past Surgical History:  Procedure Laterality Date   APPENDECTOMY     PROSTATECTOMY     SHOULDER SURGERY     bone spur removed from right    FAMILY  HISTORY: Family History  Problem Relation Age of Onset   Emphysema Father    Congestive Heart Failure Mother    Emphysema Brother    Asthma Daughter     SOCIAL HISTORY:  Social History   Socioeconomic History   Marital status: Married    Spouse name: Not on file   Number of children: 2   Years of education: Not on file   Highest education level: Not on file  Occupational History   Occupation: Retired    Comment: Optician, dispensing  Tobacco  Use   Smoking status: Never   Smokeless tobacco: Never  Vaping Use   Vaping status: Never Used  Substance and Sexual Activity   Alcohol use: Not Currently   Drug use: No   Sexual activity: Not Currently  Other Topics Concern   Not on file  Social History Narrative   Not on file   Social Drivers of Health   Financial Resource Strain: Not on file  Food Insecurity: Not on file  Transportation Needs: Not on file  Physical Activity: Not on file  Stress: Not on file  Social Connections: Not on file  Intimate Partner Violence: Not on file     PHYSICAL EXAM  Vitals:   04/15/24 1417  BP: (!) 166/89  Pulse: 74  Weight: 194 lb (88 kg)  Height: 5' 11 (1.803 m)    Body mass index is 27.06 kg/m.   General: The patient is well-developed and well-nourished and in no acute distress.        Neurologic Exam  Mental status: The patient is alert and oriented x 3 at the time of the examination. The patient has apparent normal recent and remote memory, with an apparently normal attention span and concentration ability.   Speech is normal.  Cranial nerves: Extraocular movements are full.  Facial strength and sensation is normal.. The left pupil is irregular (old) trapezius and sternocleidomastoid strength is normal. No dysarthria is noted.  Hearing is symmetric.   Motor:  Muscle bulk is reduecd in the calves.   Muscle tone is normal. Strength is  5 / 5 in all 4 extremities.     Sensory: Sensation is intact to temperature, touch and vibration  in the arms and hands.  Vibration sensation is reduced to 10-20% at the ankles and near absent at the toes.. Touch was just slightly reduced in feet.     Coordination: He has good finger-nose-finger and heel-to-shin.  Gait and station: Station is stable with the eyes open.  His gait is fairly normal for age.  The tandem gait is moderately wide.  He has a couple steps of retropulsion.  Romberg is borderline.  Reflexes: Deep tendon reflexes are increased at the knees.    DIAGNOSTIC DATA (LABS, IMAGING, TESTING) - I reviewed patient records, labs, notes, testing and imaging myself where available.  Lab Results  Component Value Date   WBC 10.5 03/15/2021   HGB 11.0 (L) 03/15/2021   HCT 32.0 (L) 03/15/2021   MCV 91.2 03/15/2021   PLT 186 03/15/2021      Component Value Date/Time   NA 141 03/23/2024 1214   K 4.6 03/23/2024 1214   CL 99 03/23/2024 1214   CO2 26 03/23/2024 1214   GLUCOSE 81 03/23/2024 1214   GLUCOSE 101 (H) 03/20/2021 0408   BUN 28 (H) 03/23/2024 1214   CREATININE 1.36 (H) 03/23/2024 1214   CALCIUM  9.7 03/23/2024 1214   PROT 6.3 (L) 03/11/2021 0537   PROT 6.8 10/03/2020 1335   ALBUMIN 3.0 (L) 03/13/2021 0413   AST 18 03/11/2021 0537   ALT 13 03/11/2021 0537   ALKPHOS 87 03/11/2021 0537   BILITOT 0.9 03/11/2021 0537   GFRNONAA >60 03/20/2021 0408   GFRAA 67 04/13/2020 1104      ASSESSMENT AND PLAN  Myalgia - Plan: CK  Polyneuropathy  Essential tremor  Dysesthesia   1.    Continue Mysoline  and propranolol  for tremor.    2.    Continue lamortigine for dysesthesias.  Although the presumptive diagnosis has  been polyneuropathy due to the distribution of the numbness, NCV/EMG did not show significant changes.  Additionally, he has reflexes in the legs.  MRI of the cervical spine in 2016, however, had not shown any spinal cord changes.   To help with the pain we will add low-dose tramadol .  I will also check creatinine kinase to see if there is any evidence  of muscle breakdown as his pain is mostly in the thigh now 3.  RTC 12 months, sooner if problems  Kade Demicco A. Vear, MD, PhD 04/15/2024, 3:35 PM Certified in Neurology, Clinical Neurophysiology, Sleep Medicine, Pain Medicine and Neuroimaging  [facility] [address], [address] [place], [place] [address] [phone]

## 2024-04-16 ENCOUNTER — Ambulatory Visit: Payer: Self-pay | Admitting: Neurology

## 2024-04-16 LAB — CK: Total CK: 56 U/L (ref 41–331)

## 2024-04-17 ENCOUNTER — Other Ambulatory Visit (HOSPITAL_COMMUNITY): Payer: Self-pay

## 2024-04-18 LAB — BASIC METABOLIC PANEL WITH GFR
BUN/Creatinine Ratio: 13 (ref 10–24)
BUN: 15 mg/dL (ref 8–27)
CO2: 24 mmol/L (ref 20–29)
Calcium: 9.5 mg/dL (ref 8.6–10.2)
Chloride: 101 mmol/L (ref 96–106)
Creatinine, Ser: 1.17 mg/dL (ref 0.76–1.27)
Glucose: 105 mg/dL — ABNORMAL HIGH (ref 70–99)
Potassium: 5 mmol/L (ref 3.5–5.2)
Sodium: 138 mmol/L (ref 134–144)
eGFR: 63 mL/min/1.73 (ref >59)

## 2024-04-20 ENCOUNTER — Encounter: Payer: Self-pay | Admitting: Cardiology

## 2024-04-20 ENCOUNTER — Other Ambulatory Visit (HOSPITAL_COMMUNITY): Payer: Self-pay

## 2024-04-20 ENCOUNTER — Ambulatory Visit: Attending: Cardiology | Admitting: Cardiology

## 2024-04-20 VITALS — BP 124/88 | HR 73 | Ht 70.0 in | Wt 194.8 lb

## 2024-04-20 DIAGNOSIS — I480 Paroxysmal atrial fibrillation: Secondary | ICD-10-CM | POA: Diagnosis not present

## 2024-04-20 DIAGNOSIS — I10 Essential (primary) hypertension: Secondary | ICD-10-CM | POA: Diagnosis not present

## 2024-04-20 NOTE — Patient Instructions (Signed)
 Lab Work: CBC BMP  If you have labs (blood work) drawn today and your tests are completely normal, you will receive your results only by: MyChart Message (if you have MyChart) OR A paper copy in the mail If you have any lab test that is abnormal or we need to change your treatment, we will call you to review the results.  Testing/Procedures: Cardioversion   Follow-Up: At [facility], you and your health needs are our priority.  As part of our continuing mission to provide you with exceptional heart care, our providers are all part of one team.  This team includes your primary Cardiologist (physician) and Advanced Practice Providers or APPs (Physician Assistants and Nurse Practitioners) who all work together to provide you with the care you need, when you need it.  Your next appointment:   2 week(s)  Provider:   One of our Advanced Practice Providers (APPs): Morse Clause, PA-C  Lamarr Satterfield, NP Miriam Shams, NP  Olivia Pavy, PA-C Josefa Beauvais, NP  Leontine Salen, PA-C Orren Fabry, PA-C  Mulberry, PA-C Ernest Dick, NP  Damien Braver, NP Jon Hails, PA-C  Waddell Donath, PA-C    Dayna Dunn, PA-C  Scott Weaver, PA-C Lum Louis, NP Katlyn West, NP Callie Goodrich, PA-C  Xika Zhao, NP Sheng Haley, PA-C    Kathleen Johnson, PA-C   Then, [name], MD will plan to see you again in 3 month(s).    Other Instructions     Dear [name]  You are scheduled for a Cardioversion on Friday, September 19 with Dr. Francyne.  Please arrive at the [facility] (Main Entrance A) at [facility]: [address] [place], [place] [address] at 11:00 AM (This time is 1 hour(s) before your procedure to ensure your preparation).   Free valet parking service is available. You will check in at ADMITTING.   *Please Note: You will receive a call the day before your procedure to confirm the appointment time. That time may have changed from the original time  based on the schedule for that day.*   DIET:  Nothing to eat or drink after midnight except a sip of water with medications (see medication instructions below)  MEDICATION INSTRUCTIONS: !!IF ANY NEW MEDICATIONS ARE STARTED AFTER TODAY, PLEASE NOTIFY YOUR PROVIDER AS SOON AS POSSIBLE!!  FYI: Medications such as Semaglutide (Ozempic, [place]), Tirzepatide (Mounjaro, Zepbound), Dulaglutide (Trulicity), etc (GLP1 agonists) AND Canagliflozin (Invokana), Dapagliflozin (Farxiga), Empagliflozin (Jardiance), Ertugliflozin (Steglatro), Bexagliflozin [employer]) or any combination with one of these drugs such as Invokamet (Canagliflozin/Metformin), Synjardy (Empagliflozin/Metformin), etc (SGLT2 inhibitors) must be held around the time of a procedure. This is not a comprehensive list of all of these drugs. Please review all of your medications and talk to your provider if you take any one of these. If you are not sure, ask your provider.  HOLD lasix  and spironolactone  day of cardioversion   Continue taking your anticoagulant (blood thinner): Apixaban  (Eliquis ).  You will need to continue this after your procedure until you are told by your provider that it is safe to stop.    LABS:  Come to the lab at the [facility] ([address], [place], 1st Floor) between the hours of 8:00 am and 4:30 pm. You do NOT have to be fasting.  FYI:  For your safety, and to allow us  to monitor your vital signs accurately during the surgery/procedure we request: If you have artificial nails,  gel coating, SNS etc, please have those removed prior to your surgery/procedure. Not having the nail coverings /polish removed may result in cancellation or delay of your surgery/procedure.  Your support person will be asked to wait in the waiting room during your procedure.  It is OK to have someone drop you off and come back when you are ready to be discharged.  You cannot drive after the  procedure and will need someone to drive you home.  Bring your insurance cards.  *Special Note: Every effort is made to have your procedure done on time. Occasionally there are emergencies that occur at the hospital that may cause delays. Please be patient if a delay does occur.

## 2024-04-21 LAB — CBC
Hematocrit: 44.9 % (ref 37.5–51.0)
Hemoglobin: 14.9 g/dL (ref 13.0–17.7)
MCH: 31.9 pg (ref 26.6–33.0)
MCHC: 33.2 g/dL (ref 31.5–35.7)
MCV: 96 fL (ref 79–97)
Platelets: 120 x10E3/uL — ABNORMAL LOW (ref 150–450)
RBC: 4.67 x10E6/uL (ref 4.14–5.80)
RDW: 14.4 % (ref 11.6–15.4)
WBC: 7.2 x10E3/uL (ref 3.4–10.8)

## 2024-04-21 LAB — BASIC METABOLIC PANEL WITH GFR
BUN/Creatinine Ratio: 11 (ref 10–24)
BUN: 14 mg/dL (ref 8–27)
CO2: 24 mmol/L (ref 20–29)
Calcium: 9.8 mg/dL (ref 8.6–10.2)
Chloride: 102 mmol/L (ref 96–106)
Creatinine, Ser: 1.24 mg/dL (ref 0.76–1.27)
Glucose: 90 mg/dL (ref 70–99)
Potassium: 5.1 mmol/L (ref 3.5–5.2)
Sodium: 140 mmol/L (ref 134–144)
eGFR: 59 mL/min/1.73 — AB (ref >59)

## 2024-04-22 ENCOUNTER — Ambulatory Visit: Payer: Self-pay | Admitting: Cardiology

## 2024-04-23 NOTE — Progress Notes (Signed)
 Spoke to patient and instructed them to come at 1045  and to be NPO after 0000.     Confirmed that patient will have a ride home and someone to stay with them for 24 hours after the procedure.   Medications reviewed.  Confirmed blood thinner.  Confirmed no breaks in taking blood thinner for 3+ weeks prior to procedure.

## 2024-04-24 ENCOUNTER — Encounter (HOSPITAL_COMMUNITY)
Admission: RE | Disposition: A | Discharge status: Home / Self Care | Payer: Self-pay | Source: Home / Self Care | Attending: Cardiovascular Disease

## 2024-04-24 ENCOUNTER — Ambulatory Visit (HOSPITAL_COMMUNITY): Admitting: Registered Nurse

## 2024-04-24 ENCOUNTER — Ambulatory Visit (HOSPITAL_COMMUNITY)
Admission: RE | Admit: 2024-04-24 | Discharge: 2024-04-24 | Disposition: A | Discharge status: Home / Self Care | Attending: Cardiovascular Disease | Admitting: Cardiovascular Disease

## 2024-04-24 ENCOUNTER — Other Ambulatory Visit: Payer: Self-pay

## 2024-04-24 DIAGNOSIS — I5030 Unspecified diastolic (congestive) heart failure: Secondary | ICD-10-CM | POA: Diagnosis not present

## 2024-04-24 DIAGNOSIS — Z8546 Personal history of malignant neoplasm of prostate: Secondary | ICD-10-CM | POA: Insufficient documentation

## 2024-04-24 DIAGNOSIS — I130 Hypertensive heart and chronic kidney disease with heart failure and stage 1 through stage 4 chronic kidney disease, or unspecified chronic kidney disease: Secondary | ICD-10-CM

## 2024-04-24 DIAGNOSIS — G250 Essential tremor: Secondary | ICD-10-CM | POA: Insufficient documentation

## 2024-04-24 DIAGNOSIS — I4819 Other persistent atrial fibrillation: Secondary | ICD-10-CM | POA: Diagnosis present

## 2024-04-24 DIAGNOSIS — N1831 Chronic kidney disease, stage 3a: Secondary | ICD-10-CM | POA: Diagnosis not present

## 2024-04-24 DIAGNOSIS — I110 Hypertensive heart disease with heart failure: Secondary | ICD-10-CM | POA: Diagnosis not present

## 2024-04-24 DIAGNOSIS — I5032 Chronic diastolic (congestive) heart failure: Secondary | ICD-10-CM | POA: Insufficient documentation

## 2024-04-24 DIAGNOSIS — Z006 Encounter for examination for normal comparison and control in clinical research program: Secondary | ICD-10-CM

## 2024-04-24 DIAGNOSIS — G4730 Sleep apnea, unspecified: Secondary | ICD-10-CM | POA: Insufficient documentation

## 2024-04-24 DIAGNOSIS — G629 Polyneuropathy, unspecified: Secondary | ICD-10-CM | POA: Diagnosis not present

## 2024-04-24 DIAGNOSIS — I4891 Unspecified atrial fibrillation: Secondary | ICD-10-CM | POA: Diagnosis not present

## 2024-04-24 DIAGNOSIS — I480 Paroxysmal atrial fibrillation: Secondary | ICD-10-CM

## 2024-04-24 HISTORY — PX: CARDIOVERSION: EP1203

## 2024-04-24 SURGERY — CARDIOVERSION (CATH LAB)
Anesthesia: General

## 2024-04-24 MED ORDER — PROPOFOL 10 MG/ML IV BOLUS
INTRAVENOUS | Status: DC | PRN
  Administered 2024-04-24: 40 mg via INTRAVENOUS

## 2024-04-24 MED ORDER — SODIUM CHLORIDE 0.9 % IV SOLN: 250.0000 mL | INTRAVENOUS | Status: DC | PRN

## 2024-04-24 MED ORDER — SODIUM CHLORIDE 0.9% FLUSH: 3.0000 mL | INTRAVENOUS | Status: DC | PRN

## 2024-04-24 MED ORDER — LIDOCAINE 2% (20 MG/ML) 5 ML SYRINGE
INTRAMUSCULAR | Status: DC | PRN
  Administered 2024-04-24: 100 mg via INTRAVENOUS

## 2024-04-24 MED ORDER — SODIUM CHLORIDE 0.9% FLUSH: 3.0000 mL | Freq: Two times a day (BID) | INTRAVENOUS | Status: DC

## 2024-04-24 SURGICAL SUPPLY — 1 items: PAD DEFIB RADIO PHYSIO CONN (PAD) ×1 IMPLANT

## 2024-04-24 NOTE — Op Note (Signed)
 Procedure: Electrical Cardioversion Indications:  Atrial Fibrillation  Procedure Details:  Consent: Risks of procedure as well as the alternatives and risks of each were explained to the (patient/caregiver).  Consent for procedure obtained.  Time Out: Verified patient identification, verified procedure, site/side was marked, verified correct patient position, special equipment/implants available, medications/allergies/relevent history reviewed, required imaging and test results available.  Performed  Patient placed on cardiac monitor, pulse oximetry, supplemental oxygen  as necessary.  Sedation given: propofol  IV, Dr. Keneth Pacer pads placed anterior and posterior chest.  Cardioverted 1 time(s).  Cardioversion with synchronized biphasic 200J shock.  Evaluation: Findings: Post procedure EKG shows: NSR Complications: None Patient did tolerate procedure well.  Time Spent Directly with the Patient:  30 minutes   Sueanne Maniaci 04/24/2024, 12:19 PM

## 2024-04-24 NOTE — Interval H&P Note (Signed)
 History and Physical Interval Note:  04/24/2024 11:18 AM  [name]  has presented today for surgery, with the diagnosis of AFIB.  The various methods of treatment have been discussed with the patient and family. After consideration of risks, benefits and other options for treatment, the patient has consented to  Procedure(s): CARDIOVERSION (N/A) as a surgical intervention.  The patient's history has been reviewed, patient examined, no change in status, stable for surgery.  I have reviewed the patient's chart and labs.  Questions were answered to the patient's satisfaction.     [name]

## 2024-04-24 NOTE — Anesthesia Postprocedure Evaluation (Signed)
 Anesthesia Post Note  Patient: [name]  Procedure(s) Performed: CARDIOVERSION     Patient location during evaluation: PACU Anesthesia Type: General Level of consciousness: awake and alert Pain management: pain level controlled Vital Signs Assessment: post-procedure vital signs reviewed and stable Respiratory status: spontaneous breathing, nonlabored ventilation, respiratory function stable and patient connected to nasal cannula oxygen  Cardiovascular status: blood pressure returned to baseline and stable Postop Assessment: no apparent nausea or vomiting Anesthetic complications: no   No notable events documented.  Last Vitals:  Vitals:   04/24/24 1255 04/24/24 1300  BP: 134/77 132/71  Pulse: 70 (!) 59  Resp: 18 16  Temp:    SpO2: 95% 97%    Last Pain:  Vitals:   04/24/24 1238  TempSrc: Temporal  PainSc: 0-No pain                 Lynwood MARLA Cornea

## 2024-04-24 NOTE — Transfer of Care (Signed)
 Immediate Anesthesia Transfer of Care Note  Patient: [name]  Procedure(s) Performed: CARDIOVERSION  Patient Location: Cath Lab  Anesthesia Type:General  Level of Consciousness: drowsy  Airway & Oxygen  Therapy: Patient Spontanous Breathing and Patient connected to nasal cannula oxygen   Post-op Assessment: Report given to RN and Post -op Vital signs reviewed and stable  Post vital signs: Reviewed and stable  Last Vitals:  Vitals Value Taken Time  BP 103/61   Temp    Pulse 49   Resp 18   SpO2 97     Last Pain:  Vitals:   04/24/24 1054  TempSrc: Temporal         Complications: No notable events documented.

## 2024-04-24 NOTE — Research (Signed)
 [facility] Cardioversion Informed Consent   Subject Name: [name]  Subject met inclusion and exclusion criteria.  The informed consent form, study requirements and expectations were reviewed with the subject and questions and concerns were addressed prior to the signing of the consent form.  The subject verbalized understanding of the trial requirements.  The subject agreed to participate in the [facility] Cardioversion trial and signed the informed consent at 1105 on 19/Sep/2025.  The informed consent was obtained prior to performance of any protocol-specific procedures for the subject.  A copy of the signed informed consent was given to the subject and a copy was placed in the subject's medical record.   [name] [name]

## 2024-04-24 NOTE — Anesthesia Preprocedure Evaluation (Addendum)
 Anesthesia Evaluation  Patient identified by MRN, date of birth, ID band Patient awake    Reviewed: Allergy  & Precautions, NPO status , Patient's Chart, lab work & pertinent test results, reviewed documented beta blocker date and time   History of Anesthesia Complications Negative for: history of anesthetic complications  Airway Mallampati: II  TM Distance: >3 FB     Dental no notable dental hx.    Pulmonary sleep apnea , pneumonia   breath sounds clear to auscultation       Cardiovascular hypertension, + dysrhythmias  Rhythm:Irregular Rate:Normal  IMPRESSIONS     1. Left ventricular ejection fraction, by estimation, is 60 to 65%. The  left ventricle has normal function. The left ventricle has no regional  wall motion abnormalities. Left ventricular diastolic function could not  be evaluated.   2. Right ventricular systolic function is normal. The right ventricular  size is normal. There is normal pulmonary artery systolic pressure.   3. Left atrial size was moderately dilated.   4. The mitral valve is normal in structure. Mild mitral valve  regurgitation. No evidence of mitral stenosis.   5. The aortic valve is tricuspid. There is mild calcification of the  aortic valve. Aortic valve regurgitation is trivial. No aortic stenosis is  present.   6. The inferior vena cava is normal in size with greater than 50%  respiratory variability, suggesting right atrial pressure of 3 mmHg.     Neuro/Psych  Neuromuscular disease    GI/Hepatic   Endo/Other    Renal/GU Renal disease     Musculoskeletal   Abdominal   Peds  Hematology  (+) Blood dyscrasia, anemia   Anesthesia Other Findings   Reproductive/Obstetrics                              Anesthesia Physical Anesthesia Plan  ASA: 3  Anesthesia Plan: General   Post-op Pain Management:    Induction: Intravenous  PONV Risk Score and  Plan: 1 and Ondansetron   Airway Management Planned: Natural Airway and Nasal Cannula  Additional Equipment:   Intra-op Plan:   Post-operative Plan:   Informed Consent: I have reviewed the patients History and Physical, chart, labs and discussed the procedure including the risks, benefits and alternatives for the proposed anesthesia with the patient or authorized representative who has indicated his/her understanding and acceptance.     Dental advisory given  Plan Discussed with: CRNA  Anesthesia Plan Comments:          Anesthesia Quick Evaluation

## 2024-04-26 ENCOUNTER — Encounter (HOSPITAL_COMMUNITY): Payer: Self-pay | Admitting: Cardiovascular Disease

## 2024-05-05 NOTE — Progress Notes (Unsigned)
 Cardiology Office Note    Patient Name: [name] Date of Encounter: 05/05/2024  Primary Care Provider:  Clarice Nottingham, MD Primary Cardiologist:  [name], MD Primary Electrophysiologist: None   Past Medical History    Past Medical History:  Diagnosis Date   Afib (HCC)    Cancer (HCC)    Elevated diaphragm    Gout    Heart failure (HCC) 04/05/2021   History of prostate cancer 09/07/1991   Hyperlipidemia    Hypertension    Hypoxemia    Normocytic anemia    OSA (obstructive sleep apnea)    Peripheral neuropathy    Pneumonia    Vision abnormalities     History of Present Illness  [name] is a 78 y.o. male with a PMH of paroxysmal AF, HFpEF, essential tremor, prostate CA, HTN who presents today for post cardioversion follow-up.  [name] has been followed by Dr. Lawrence since April 2021 and of hypertension.  Initial visit he endorsed sharp chest pain with certain positions that resolved spontaneously.  He was seen on 02/19/2024 with complaint of headache and dizziness as well as elevated BP.  He was advised to continue to monitor BP at that time and was seen back in follow-up 1 month later and new onset atrial fibrillation.  He was started on Eliquis  5 mg twice daily and was set up for DCCV after 4 weeks of an unerupted AC that took place on 04/24/2024 with conversion to sinus rhythm.  [name] presents today for follow-up of recent cardioversion.  EKG was completed showing that patient has maintained sinus rhythm since his procedure. No palpitations, skipped beats, chest pain, shortness of breath, or dizziness since the cardioversion. He is currently taking Eliquis  without any bleeding issues. Previously, he experienced dizziness and headaches in July when his blood pressure was elevated, but these symptoms have resolved. His home blood pressure readings remain elevated, averaging 156/97 over the past month, despite being controlled in the office.  He uses an [employer] blood pressure monitor and checks his blood pressure once daily. His losartan  dosage was recently increased from 50 mg to 100 mg, taken in the evening. He also takes propranolol  for a tremor, which was prescribed by his neurologist. No palpitations, skipped beats, chest pain, dizziness, or swelling in the ankles.  Patient denies chest pain, palpitations, dyspnea, PND, orthopnea, nausea, vomiting, dizziness, syncope, edema, weight gain, or early satiety.   Discussed the use of AI scribe software for clinical note transcription with the patient, who gave verbal consent to proceed.  History of Present Illness   Review of Systems  Please see the history of present illness.    All other systems reviewed and are otherwise negative except as noted above.  Physical Exam    Wt Readings from Last 3 Encounters:  04/20/24 194 lb 12.8 oz (88.4 kg)  04/15/24 194 lb (88 kg)  03/23/24 191 lb (86.6 kg)   CD:Uyzmz were no vitals filed for this visit.,There is no height or weight on file to calculate BMI. GEN: Well nourished, well developed in no acute distress Neck: No JVD; No carotid bruits Pulmonary: Clear to auscultation without rales, wheezing or rhonchi  Cardiovascular: Normal rate. Regular rhythm. Normal S1. Normal S2.   Murmurs: There is no murmur.  ABDOMEN: Soft, non-tender, non-distended EXTREMITIES:  No edema; No deformity   EKG/LABS/ Recent Cardiac Studies   ECG personally reviewed by me today -sinus bradycardia with a rate of 56 bpm and no  acute changes consistent with previous EKG.  Risk Assessment/Calculations:    CHA2DS2-VASc Score = 3   This indicates a 3.2% annual risk of stroke. The patient's score is based upon: CHF History: 0 HTN History: 1 Diabetes History: 0 Stroke History: 0 Vascular Disease History: 0 Age Score: 2 Gender Score: 0         Lab Results  Component Value Date   WBC 7.2 04/20/2024   HGB 14.9 04/20/2024   HCT 44.9 04/20/2024   MCV  96 04/20/2024   PLT 120 (L) 04/20/2024   Lab Results  Component Value Date   CREATININE 1.24 04/20/2024   BUN 14 04/20/2024   NA 140 04/20/2024   K 5.1 04/20/2024   CL 102 04/20/2024   CO2 24 04/20/2024   Lab Results  Component Value Date   CHOL 126 01/20/2011   HDL 51 01/20/2011   LDLCALC 63 01/20/2011   TRIG 60 01/20/2011   CHOLHDL 2.5 01/20/2011    No results found for: HGBA1C Assessment & Plan   Assessment & Plan  1.  Paroxysmal AF: - Paroxysmal with recent DCCV completed on 04/24/2024 and today patient is sinus rhythm following successful cardioversion. - Continue rate control with propranolol  10 mg Eliquis  5 mg twice daily - Order 30-day event monitor to assess for recurrence. - Educated on avoiding triggers: caffeine, alcohol, dehydration, stress. - Discussed potential causes: infections, abscesses. - Consider discontinuation of blood thinners if no recurrence detected.  2.  Essential hypertension: - Patient's blood pressure today was controlled today at 118/82 Elevated home readings, controlled in-office. Current regimen: losartan , propranolol . No symptomatic issues. - Instruct to take losartan  in the morning. - Provide blood pressure log for two weeks. - Instruct to check blood pressure two hours post-medication and in the evening. - Refer to clinical pharmacist for titration if uncontrolled after log review.  3.  HFpEF: - Patient's last 2D echo was completed on 9//25 with EF of 60 to 65% and no RWMA with mild MVR and moderately dilated LA - Patient is euvolemic on examination today. - Continue spironolactone  25 mg, propranolol  10 mg, losartan  100 mg -Low sodium diet, fluid restriction <2L, and daily weights encouraged. Educated to contact our office for weight gain of 2 lbs overnight or 5 lbs in one week.   4.  Hyperlipidemia: - Patient's last LDL cholesterol was 60 -Continue Zocor  20 mg daily     Disposition: Follow-up with [name], MD as  scheduled    Signed, Wyn Raddle, Jackee Shove, NP 05/05/2024, 9:55 AM [facility]

## 2024-05-06 ENCOUNTER — Encounter: Payer: Self-pay | Admitting: Nurse Practitioner

## 2024-05-06 ENCOUNTER — Ambulatory Visit: Attending: Internal Medicine | Admitting: Nurse Practitioner

## 2024-05-06 VITALS — BP 118/82 | HR 59 | Ht 70.0 in | Wt 191.6 lb

## 2024-05-06 DIAGNOSIS — E782 Mixed hyperlipidemia: Secondary | ICD-10-CM

## 2024-05-06 DIAGNOSIS — I480 Paroxysmal atrial fibrillation: Secondary | ICD-10-CM | POA: Diagnosis not present

## 2024-05-06 DIAGNOSIS — I10 Essential (primary) hypertension: Secondary | ICD-10-CM

## 2024-05-06 DIAGNOSIS — I5032 Chronic diastolic (congestive) heart failure: Secondary | ICD-10-CM | POA: Diagnosis not present

## 2024-05-06 NOTE — Patient Instructions (Signed)
 Medication Instructions:  TAKE Losartan  daily in the mornings  *If you need a refill on your cardiac medications before your next appointment, please call your pharmacy*  Lab Work: None ordered If you have labs (blood work) drawn today and your tests are completely normal, you will receive your results only by: MyChart Message (if you have MyChart) OR A paper copy in the mail If you have any lab test that is abnormal or we need to change your treatment, we will call you to review the results.  Testing/Procedures: Your physician has recommended that you wear an 30 day event monitor. Event monitors are medical devices that record the heart's electrical activity. Doctors most often us  these monitors to diagnose arrhythmias. Arrhythmias are problems with the speed or rhythm of the heartbeat. The monitor is a small, portable device. You can wear one while you do your normal daily activities. This is usually used to diagnose what is causing palpitations/syncope (passing out).  Follow-Up: At [facility], you and your health needs are our priority.  As part of our continuing mission to provide you with exceptional heart care, our providers are all part of one team.  This team includes your primary Cardiologist (physician) and Advanced Practice Providers or APPs (Physician Assistants and Nurse Practitioners) who all work together to provide you with the care you need, when you need it.  Your next appointment:   FOLLOW UP AS SCHEDULED   Provider:   [name], MD    We recommend signing up for the patient portal called MyChart.  Sign up information is provided on this After Visit Summary.  MyChart is used to connect with patients for Virtual Visits (Telemedicine).  Patients are able to view lab/test results, encounter notes, upcoming appointments, etc.  Non-urgent messages can be sent to your provider as well.   To learn more about what you can do with MyChart, go to  [email or web address].   Other Instructions Check your blood pressure daily for 2 weeks, then contact the office with your readings.  Contact the office either by phone or MyChart with your readings.  Make sure to check your blood pressure 2 hours after taking your medications.   AVOID these things for 30 minutes before checking your blood pressure: No Drinking caffeine. No Drinking alcohol. No Eating. No Smoking. No Exercising.  Five minutes before checking your blood pressure: Pee. Sit in a dining chair. Avoid sitting in a soft couch or armchair. Be quiet. Do not talk.

## 2024-05-11 ENCOUNTER — Encounter: Payer: Self-pay | Admitting: Cardiology

## 2024-05-12 ENCOUNTER — Encounter: Payer: Self-pay | Admitting: Cardiology

## 2024-05-12 NOTE — Telephone Encounter (Signed)
 Home blood pressures are consistently elevated than readings in the office, which has created some confusion in the past.  However, I agree that blood pressure needs to be better controlled.  I would recommend switching the propranolol  to labetalol  200 mg twice daily.  If patient has concerns with stopping propranolol , I am okay to continue both medications, if necessary.  If blood pressure remains uncontrolled, he may need referral to hypertension clinic for management of resistant hypertension.  Thanks MJP

## 2024-05-12 NOTE — Telephone Encounter (Signed)
 I did not understand your last message.  I tried calling patient at this time, but went to voicemail.  We can readdress tomorrow morning.  Thanks MJP

## 2024-05-12 NOTE — Telephone Encounter (Signed)
 Can you make sure this gets done, thank you for helping me!

## 2024-05-13 ENCOUNTER — Telehealth: Payer: Self-pay | Admitting: Cardiology

## 2024-05-13 DIAGNOSIS — I10 Essential (primary) hypertension: Secondary | ICD-10-CM

## 2024-05-13 DIAGNOSIS — I480 Paroxysmal atrial fibrillation: Secondary | ICD-10-CM

## 2024-05-13 MED ORDER — HYDRALAZINE HCL 50 MG PO TABS: 50.0000 mg | ORAL_TABLET | Freq: Three times a day (TID) | ORAL | 3 refills | Status: DC

## 2024-05-13 MED ORDER — LABETALOL HCL 200 MG PO TABS: 200.0000 mg | ORAL_TABLET | Freq: Two times a day (BID) | ORAL | 1 refills | Status: DC

## 2024-05-13 NOTE — Telephone Encounter (Signed)
 See other MyChart encounter.

## 2024-05-13 NOTE — Telephone Encounter (Signed)
 I spoke with the patient, you do not need to call.  Below was my documentation on a telephone conversation.  Spoke with the patient.  Blood pressure still staying elevated.  He has not picked up labetalol  yet.  Given that propranolol  is necessary for his tremors, we decided to discontinue labetalol  and instead start hydralazine 50 mg 3 times daily.  Prescription sent to [facility].  Patient will keep me updated about blood pressure control.  [name], MD

## 2024-05-13 NOTE — Telephone Encounter (Signed)
 Spoke with the patient.  Blood pressure still staying elevated.  He has not picked up labetalol  yet.  Given that propranolol  is necessary for his tremors, we decided to discontinue labetalol  and instead start hydralazine 50 mg 3 times daily.  Prescription sent to [facility] pharmacy.  Patient will keep me updated about blood pressure control.  [name], MD

## 2024-05-17 ENCOUNTER — Encounter: Payer: Self-pay | Admitting: Cardiology

## 2024-05-18 ENCOUNTER — Other Ambulatory Visit (HOSPITAL_BASED_OUTPATIENT_CLINIC_OR_DEPARTMENT_OTHER): Payer: Self-pay

## 2024-05-18 ENCOUNTER — Emergency Department (HOSPITAL_BASED_OUTPATIENT_CLINIC_OR_DEPARTMENT_OTHER)

## 2024-05-18 ENCOUNTER — Emergency Department (HOSPITAL_BASED_OUTPATIENT_CLINIC_OR_DEPARTMENT_OTHER)
Admission: EM | Admit: 2024-05-18 | Discharge: 2024-05-18 | Disposition: A | Discharge status: Home / Self Care | Attending: Emergency Medicine | Admitting: Emergency Medicine

## 2024-05-18 ENCOUNTER — Encounter (HOSPITAL_BASED_OUTPATIENT_CLINIC_OR_DEPARTMENT_OTHER): Payer: Self-pay | Admitting: Emergency Medicine

## 2024-05-18 ENCOUNTER — Other Ambulatory Visit: Payer: Self-pay

## 2024-05-18 DIAGNOSIS — Z79899 Other long term (current) drug therapy: Secondary | ICD-10-CM | POA: Diagnosis not present

## 2024-05-18 DIAGNOSIS — R519 Headache, unspecified: Secondary | ICD-10-CM

## 2024-05-18 DIAGNOSIS — I4891 Unspecified atrial fibrillation: Secondary | ICD-10-CM | POA: Insufficient documentation

## 2024-05-18 DIAGNOSIS — I10 Essential (primary) hypertension: Secondary | ICD-10-CM | POA: Diagnosis not present

## 2024-05-18 DIAGNOSIS — Z7901 Long term (current) use of anticoagulants: Secondary | ICD-10-CM | POA: Diagnosis not present

## 2024-05-18 LAB — BASIC METABOLIC PANEL WITH GFR
Anion gap: 12 (ref 5–15)
BUN: 16 mg/dL (ref 8–23)
CO2: 23 mmol/L (ref 22–32)
Calcium: 9.5 mg/dL (ref 8.9–10.3)
Chloride: 101 mmol/L (ref 98–111)
Creatinine, Ser: 1.15 mg/dL (ref 0.61–1.24)
GFR, Estimated: >60 mL/min (ref >60)
Glucose, Bld: 124 mg/dL — ABNORMAL HIGH (ref 70–99)
Potassium: 4.6 mmol/L (ref 3.5–5.1)
Sodium: 135 mmol/L (ref 135–145)

## 2024-05-18 LAB — CBC WITH DIFFERENTIAL/PLATELET
Abs Immature Granulocytes: 0.02 K/uL (ref 0.00–0.07)
Basophils Absolute: 0 K/uL (ref 0.0–0.1)
Basophils Relative: 1 %
Eosinophils Absolute: 0.1 K/uL (ref 0.0–0.5)
Eosinophils Relative: 2 %
HCT: 46.6 % (ref 39.0–52.0)
Hemoglobin: 16.2 g/dL (ref 13.0–17.0)
Immature Granulocytes: 0 %
Lymphocytes Relative: 35 %
Lymphs Abs: 2.1 K/uL (ref 0.7–4.0)
MCH: 32.5 pg (ref 26.0–34.0)
MCHC: 34.8 g/dL (ref 30.0–36.0)
MCV: 93.4 fL (ref 80.0–100.0)
Monocytes Absolute: 0.4 K/uL (ref 0.1–1.0)
Monocytes Relative: 7 %
Neutro Abs: 3.3 K/uL (ref 1.7–7.7)
Neutrophils Relative %: 55 %
Platelets: 148 K/uL — ABNORMAL LOW (ref 150–400)
RBC: 4.99 MIL/uL (ref 4.22–5.81)
RDW: 13.8 % (ref 11.5–15.5)
WBC: 6 K/uL (ref 4.0–10.5)
nRBC: 0 % (ref 0.0–0.2)

## 2024-05-18 MED ORDER — DIPHENHYDRAMINE HCL 25 MG PO CAPS
25.0000 mg | ORAL_CAPSULE | Freq: Once | ORAL | Status: AC
  Administered 2024-05-18: 25 mg via ORAL
  Filled 2024-05-18: qty 1

## 2024-05-18 MED ORDER — ACETAMINOPHEN 500 MG PO TABS
1000.0000 mg | ORAL_TABLET | Freq: Once | ORAL | Status: AC
  Administered 2024-05-18: 1000 mg via ORAL
  Filled 2024-05-18: qty 2

## 2024-05-18 MED ORDER — METOCLOPRAMIDE HCL 5 MG/ML IJ SOLN
5.0000 mg | Freq: Once | INTRAMUSCULAR | Status: AC
Start: 2024-05-18 — End: 2024-05-18
  Administered 2024-05-18: 5 mg via INTRAMUSCULAR
  Filled 2024-05-18: qty 2

## 2024-05-18 NOTE — Discharge Instructions (Signed)
 Please read and follow all provided instructions.  Your diagnoses today include:  1. Acute nonintractable headache, unspecified headache type   2. Primary hypertension     Tests performed today include: CT of your head which was normal and did not show any serious cause of your headache Vital signs. See below for your results today.   Medications:  In the Emergency Department you received: Reglan - antinausea/headache medication Benadryl - antihistamine to counteract potential side effects of reglan Tylenol   Take any prescribed medications only as directed.  Additional information:  Follow any educational materials contained in this packet.  You are having a headache. No specific cause was found today for your headache. It may have been a migraine or other cause of headache. Stress, anxiety, fatigue, and depression are common triggers for headaches.   Your headache today does not appear to be life-threatening or require hospitalization, but often the exact cause of headaches is not determined in the emergency department. Therefore, follow-up with your doctor is very important to find out what may have caused your headache and whether or not you need any further diagnostic testing or treatment.   Sometimes headaches can appear benign (not harmful), but then more serious symptoms can develop which should prompt an immediate re-evaluation by your doctor or the emergency department.  BE VERY CAREFUL not to take multiple medicines containing Tylenol  (also called acetaminophen ). Doing so can lead to an overdose which can damage your liver and cause liver failure and possibly death.   Follow-up instructions: Please follow-up with your cardiologist in regards to recommendations regarding blood pressure management.  Return instructions:  Please return to the Emergency Department if you experience worsening symptoms. Return if the medications do not resolve your headache, if it recurs, or if  you have multiple episodes of vomiting or cannot keep down fluids. Return if you have a change from the usual headache. RETURN IMMEDIATELY IF you: Develop a sudden, severe headache Develop confusion or become poorly responsive or faint Develop a fever above 100.58F or problem breathing Have a change in speech, vision, swallowing, or understanding Develop new weakness, numbness, tingling, incoordination in your arms or legs Have a seizure Please return if you have any other emergent concerns.  Additional Information:  Your vital signs today were: BP (!) 163/90   Pulse 70   Temp 98.3 F (36.8 C) (Oral)   Resp 18   SpO2 97%  If your blood pressure (BP) was elevated above 135/85 this visit, please have this repeated by your doctor within one month. --------------

## 2024-05-18 NOTE — ED Notes (Signed)
 Discharge instructions reviewed with patient. Patient verbalizes understanding, no further questions at this time. Medications and follow up information provided. No acute distress noted at time of departure.

## 2024-05-18 NOTE — ED Provider Notes (Signed)
 I provided a substantive portion of the care of this patient.  I personally made/approved the management plan for this patient and take responsibility for the patient management.     Patient reports he had headache for the past 4 days.  Gradual onset.  Headache seems to start after starting hydralazine on Thursday.  Headache has been variable in location.  Patient reports today it is a frontal headache.  Patient was recently started on Eliquis  with new diagnosis of atrial fibrillation end of September.  Patient not had any trauma that he is aware of.  No visual disturbance.  I have reviewed the documented log of patient's blood pressures for much of September in the beginning of October.  Pressures have ranged from systolics of 150s to 170s.  Diastolics predominantly in the 90s.  Patient is alert.  No acute distress.  Mental status clear.  Heart regular.  Lungs grossly clear.  No focal motor deficits.  No abdominal pain to palpation, no peripheral edema, calf soft nontender.  I agree with proceeding with CT head in light of anticoagulation.  Current blood pressure 167/87.  Agree with plan of management   Armenta Canning, MD 05/18/24 1437

## 2024-05-18 NOTE — ED Provider Notes (Signed)
 [place] [facility] AT [facility] Provider Note   CSN: [id number] Arrival date & time: 05/18/24  1316     Patient presents with: Headache   [name] is a 78 y.o. male.   Patient with history of hypertension, recently had losartan  increased last month, at that time found A-fib on EKG, patient was started on anticoagulation and had cardioversion performed.  Blood pressures were persistently elevated leading to initiation of hydralazine last week.  Since starting this medication patient has had bothersome frontal headache with radiation to the top of his head.  He has had headache for about 3 days.  He has had worse headaches in the past but states that this is bothersome and more persistent than usual.  No associated vision changes or vomiting.  Denies falls or injuries.  No fevers or URI symptoms. Patient denies signs of stroke including: facial droop, slurred speech, aphasia, weakness/numbness in extremities, imbalance/trouble walking.        Prior to Admission medications   Medication Sig Start Date End Date Taking? Authorizing Provider  acetaminophen  (TYLENOL ) 325 MG tablet Take 650 mg by mouth every 6 (six) hours as needed for mild pain, fever or headache.    [provider]  allopurinol (ZYLOPRIM) 100 MG tablet Take 150 mg by mouth daily.    [provider]  apixaban  (ELIQUIS ) 5 MG TABS tablet Take 1 tablet (5 mg total) by mouth 2 (two) times daily. 03/23/24   Patwardhan, Newman PARAS, MD  Cholecalciferol (D3 2000) 50 MCG (2000 UT) CAPS Take 2,000 Units by mouth daily.    [provider]  dicyclomine (BENTYL) 10 MG capsule Take 10 mg by mouth as needed for spasms.    [provider]  ezetimibe (ZETIA) 10 MG tablet Take 10 mg by mouth daily. 02/11/22   [provider]  famotidine  (PEPCID ) 20 MG tablet Take 20 mg by mouth daily. 02/17/24   [provider]  felodipine  (PLENDIL ) 10 MG 24 hr tablet Take 10 mg  by mouth daily. 02/17/21   [provider]  fexofenadine (ALLEGRA) 60 MG tablet Take 60 mg by mouth daily as needed for allergies or rhinitis.    [provider]  fluorometholone (FML) 0.1 % ophthalmic suspension Place 1 drop into both eyes 3 (three) times daily. 03/18/22   [provider]  furosemide  (LASIX ) 40 MG tablet Take 1 tablet (40 mg total) by mouth daily as needed. 03/23/24   Patwardhan, Newman PARAS, MD  hydrALAZINE (APRESOLINE) 50 MG tablet Take 1 tablet (50 mg total) by mouth 3 (three) times daily. 05/13/24 08/11/24  Patwardhan, Newman PARAS, MD  ketorolac  (ACULAR ) 0.5 % ophthalmic solution Place 1 drop into both eyes in the morning, at noon, in the evening, and at bedtime. 10/02/21   [provider]  lamoTRIgine  (LAMICTAL ) 150 MG tablet Take 2 tablets (300 mg total) by mouth 2 (two) times daily. 10/02/23   Lomax, Amy, NP  losartan  (COZAAR ) 100 MG tablet Take 1 tablet (100 mg total) by mouth daily. 04/09/24   Patwardhan, Newman PARAS, MD  Multiple Vitamin (MULTIVITAMIN) tablet Take 1 tablet by mouth daily.    [provider]  primidone  (MYSOLINE ) 50 MG tablet Take 1.5 tablets (75 mg total) by mouth 2 (two) times daily. TAKE ONE TABLET BY MOUTH EVERY MORNING AND TAKE ONE TABLET BY MOUTH EVERY NIGHT AT BEDTIME Patient taking differently: Take 50 mg by mouth 2 (two) times daily. TAKE ONE TABLET BY MOUTH EVERY MORNING  AND TAKE ONE TABLET BY MOUTH EVERY NIGHT AT BEDTIME  Patient takes 50mg  10/02/23   Lomax, Amy, NP  propranolol  (INDERAL ) 10 MG tablet Take 1 tablet (10 mg total) by mouth 2 (two) times daily. 10/02/23   Lomax, Amy, NP  rosuvastatin (CRESTOR) 20 MG tablet Take 20 mg by mouth at bedtime. 08/15/21   [provider]  spironolactone  (ALDACTONE ) 25 MG tablet TAKE ONE TABLET BY MOUTH ONE TIME DAILY 12/03/22   Patwardhan, Newman PARAS, MD  zolpidem  (AMBIEN ) 10 MG tablet Take 5 mg by mouth at bedtime as needed for sleep.    [provider]     Allergies: Doxycycline , Penicillins, Azathioprine, Mycophenolate mofetil, Azithromycin , Codeine, and Sulfa antibiotics    Review of Systems  Updated Vital Signs BP (!) 182/89 (BP Location: Right Arm)   Pulse 66   Temp 98 F (36.7 C) (Oral)   Resp 16   SpO2 99%   Physical Exam Vitals and nursing note reviewed.  Constitutional:      Appearance: He is well-developed.  HENT:     Head: Normocephalic and atraumatic.     Right Ear: External ear normal.     Left Ear: External ear normal.     Nose: Nose normal.     Mouth/Throat:     Pharynx: Uvula midline.  Eyes:     General: Lids are normal.     Conjunctiva/sclera: Conjunctivae normal.     Pupils: Pupils are unequal.     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  Pulmonary:     Effort: Pulmonary effort is normal.     Breath sounds: Normal breath sounds.  Abdominal:     Palpations: Abdomen is soft.     Tenderness: There is no abdominal tenderness.  Musculoskeletal:        General: Normal range of motion.     Cervical back: Normal range of motion and neck supple. No tenderness or bony tenderness.  Skin:    General: Skin is warm and dry.  Neurological:     Mental Status: He is alert and oriented to person, place, and time.     GCS: GCS eye subscore is 4. GCS verbal subscore is 5. GCS motor subscore is 6.     Cranial Nerves: No cranial nerve deficit.     Sensory: No sensory deficit.     Motor: No abnormal muscle tone.     Coordination: Coordination normal.     Gait: Gait normal.     (all labs ordered are listed, but only abnormal results are displayed) Labs Reviewed  BASIC METABOLIC PANEL WITH GFR - Abnormal; Notable for the following components:      Result Value   Glucose, Bld 124 (*)    All other components within normal limits  CBC WITH DIFFERENTIAL/PLATELET - Abnormal; Notable for the following components:    Platelets 148 (*)    All other components within normal limits  CBC WITH DIFFERENTIAL/PLATELET  CBC WITH DIFFERENTIAL/PLATELET  CBC WITH DIFFERENTIAL/PLATELET    EKG: None  Radiology: CT Head Wo Contrast Result Date: 05/18/2024 CLINICAL DATA:  Frontal headache. EXAM: CT HEAD WITHOUT CONTRAST TECHNIQUE: Contiguous axial images were obtained from the base of the skull through the vertex without intravenous contrast. RADIATION DOSE REDUCTION: This exam was performed according to the departmental dose-optimization program which includes automated exposure control, adjustment of the mA and/or kV according to patient size  and/or use of iterative reconstruction technique. COMPARISON:  March 09, 2021 FINDINGS: Brain: There is generalized cerebral atrophy with widening of the extra-axial spaces and ventricular dilatation. There are areas of decreased attenuation within the white matter tracts of the supratentorial brain, consistent with microvascular disease changes. Vascular: No hyperdense vessel or unexpected calcification. Skull: Normal. Negative for fracture or focal lesion. Sinuses/Orbits: No acute finding. Other: None. IMPRESSION: 1. Generalized cerebral atrophy and microvascular disease changes of the supratentorial brain. 2. No acute intracranial abnormality. Electronically Signed   By: Suzen Dials M.D.   On: 05/18/2024 14:47     Procedures   Medications Ordered in the ED  metoCLOPramide (REGLAN) injection 5 mg (5 mg Intramuscular Given 05/18/24 1601)  diphenhydrAMINE (BENADRYL) capsule 25 mg (25 mg Oral Given 05/18/24 1559)  acetaminophen  (TYLENOL ) tablet 1,000 mg (1,000 mg Oral Given 05/18/24 1559)    ED Course  Patient seen and examined. History obtained directly from patient.  Reviewed cardiology notes and blood pressure management changes.  Labs/EKG: Ordered CBC, BMP.  Imaging: Ordered CT head.  Medications/Fluids: None ordered  Most recent vital signs reviewed and are as  follows: BP (!) 182/89 (BP Location: Right Arm)   Pulse 66   Temp 98 F (36.7 C) (Oral)   Resp 16   SpO2 99%   Initial impression: Headache, started after initiation of hydralazine.  Not thunderclap or acute onset.  More persistent than typical headache.  Patient with also recent initiation of anticoagulation.  No meningismus.  5:02 PM Reassessment performed. Patient appears stable, unchanged on several rechecks.  There was delay in obtaining CBC.  Patient discussed with and seen earlier by Dr. Armenta.  Labs personally reviewed and interpreted including: CBC with normal white blood cell count and hemoglobin; BMP unremarkable with normal kidney function, glucose minimally elevated at 124.  Imaging personally visualized and interpreted including: CT head agree negative.  Reviewed pertinent lab work and imaging with patient at bedside. Questions answered.   Most current vital signs reviewed and are as follows: BP (!) 163/90   Pulse 70   Temp 98.3 F (36.8 C) (Oral)   Resp 18   SpO2 97%   Plan: Discharge to home.  They will follow-up with Dr. Elmira in regards to blood pressure med recommendations.  Prescriptions written for: None  Other home care instructions discussed: OTC tylenol , avoidance of triggers.   ED return instructions discussed: Patient counseled to return if they have weakness in their arms or legs, slurred speech, trouble walking or talking, confusion, trouble with their balance, or if they have any other concerns. Patient verbalizes understanding and agrees with plan.   Follow-up instructions discussed: Patient encouraged to follow-up with their PCP in 7 days.                                   Medical Decision Making Amount and/or Complexity of Data Reviewed Labs: ordered. Radiology: ordered.   In regards to the patient's headache, critical differentials were considered including subarachnoid hemorrhage, intracerebral hemorrhage, epidural/subdural  hematoma, pituitary apoplexy, vertebral/carotid artery dissection, giant cell arteritis, central venous thrombosis, reversible cerebral vasoconstriction, acute angle closure glaucoma, idiopathic intracranial hypertension, bacterial meningitis, viral encephalitis, carbon monoxide poisoning, posterior reversible encephalopathy syndrome, pre-eclampsia.   Reg flag symptoms related to these causes were considered including systemic symptoms (fever, weight loss), neurologic symptoms (confusion, mental status change, vision change, associated seizure), acute or sudden thunderclap onset, patient age  50 or older with new or progressive headache, patient of any age with first headache or change in headache pattern, pregnant or postpartum status, history of HIV or other immunocompromise, history of cancer, headache occurring with exertion, associated neck or shoulder pain, associated traumatic injury, concurrent use of anticoagulation, family history of spontaneous SAH, and concurrent drug use.    Other benign, more common causes of headache were considered including migraine, tension-type headache, cluster headache, referred pain from other cause such as sinus infection, dental pain, trigeminal neuralgia.   On exam, patient has a reassuring neuro exam including baseline mental status, no significant neck pain or meningeal signs, no signs of severe infection or fever.   CT imaging was negative and reassuring without bleeding or other abnormality.  Unclear if this is a medication intolerance as headaches did start after starting hydralazine.  Blood pressures have been modestly elevated here today but no obvious signs of endorgan damage.  Normal kidney function.  Reassuring neuro exam.  The patient's vital signs, pertinent lab work and imaging were reviewed and interpreted as discussed in the ED course. Hospitalization was considered for further testing, treatments, or serial exams/observation. However as patient  is well-appearing, has a stable exam over the course of their evaluation, and reassuring studies today, I do not feel that they warrant admission at this time. This plan was discussed with the patient who verbalizes agreement and comfort with this plan and seems reliable and able to return to the Emergency Department with worsening or changing symptoms.       Final diagnoses:  Acute nonintractable headache, unspecified headache type  Primary hypertension    ED Discharge Orders     None          Desiderio Chew, PA-C 05/18/24 1705    Armenta Canning, MD 05/19/24 [phone]

## 2024-05-18 NOTE — ED Notes (Signed)
 Notified provider of difficulties with IV.

## 2024-05-18 NOTE — ED Triage Notes (Addendum)
 States started a new med for his Bp  on Thursday  Hydralazine and h/a started Friday , denies blurred vision  states took Tylenol  yesterday did  help a little  has not fallen or had head injury  states walking well states started  blood thinners   for afib sep 12

## 2024-05-18 NOTE — ED Notes (Signed)
 UTO an IV, poor PIV sites and site ruptured after blood draw.

## 2024-05-19 ENCOUNTER — Telehealth: Payer: Self-pay | Admitting: Cardiology

## 2024-05-19 ENCOUNTER — Ambulatory Visit: Admitting: Cardiology

## 2024-05-19 DIAGNOSIS — I480 Paroxysmal atrial fibrillation: Secondary | ICD-10-CM

## 2024-05-19 NOTE — Telephone Encounter (Signed)
 Known paroxysmal A-fib, recent successful cardioversion.  It appears that he is back in A-fib.  I do not think he needs urgent appointment for A-fib, but continue monitoring as you are. On a separate note, he seems to have had a lot of headaches since starting hydralazine 50 mg 3 times daily.  We could consider stopping hydralazine and adding labetalol  200 mg twice daily instead for blood pressure control.  Thanks MJP

## 2024-05-19 NOTE — Telephone Encounter (Signed)
 Left message to call back.

## 2024-05-19 NOTE — Telephone Encounter (Signed)
 Pt c/o medication issue:  1. Name of Medication:  hydrALAZINE (APRESOLINE) 50 MG tablet   2. How are you currently taking this medication (dosage and times per day)?  As prescribed  3. Are you having a reaction (difficulty breathing--STAT)?    4. What is your medication issue?   Started medication Thursday. It has been causing headaches. Patient was recently seen in the ED and says he was advised to follow up with Dr. Elmira to see if medication needs to be changed. Please advise.

## 2024-05-19 NOTE — Telephone Encounter (Signed)
   Cardiac Monitor Alert  Date of alert:  05/19/2024   Patient Name: [name]  DOB: Dec 29, 1945  MRN: [id number]   [facility] Cardiologist: [name], MD   [facility] EP:  None    Monitor Information: Cardiac Event Monitor [[employer]]  Reason:  Paroxysmal Atrial Fibrillation Ordering provider:  Dr. Lawrence  Alert Atrial Fibrillation This is the 1st alert for this rhythm.  The patient has a hx of Atrial Fibrillation/Flutter.   Anticoagulation medication as of 05/19/2024           apixaban  (ELIQUIS ) 5 MG TABS tablet Take 1 tablet (5 mg total) by mouth 2 (two) times daily.       Next Cardiology Appointment   Date:  07/24/2024  Provider:  Dr. Lawrence  The patient could NOT be reached by telephone today.    Arrhythmia, symptoms and history reviewed with DOD.  Plan:  See [facility] or Dr. Lawrence ASAP.   Other: Message sent to [facility] to schedule patient an appointment. Will also forward to Dr. Lawrence to review. Monitor strips wil be sent for scanning to patient's chart.  Roxie LITTIE Louder, RN  05/19/2024 4:04 PM

## 2024-05-19 NOTE — Telephone Encounter (Signed)
Urgent EKG notification. Please advise

## 2024-05-20 MED ORDER — LABETALOL HCL 200 MG PO TABS: 200.0000 mg | ORAL_TABLET | Freq: Two times a day (BID) | ORAL | 3 refills | Status: DC

## 2024-05-20 NOTE — Addendum Note (Signed)
 Addended by: RICHIE ADRIEN ORN on: 05/20/2024 02:21 PM   Modules accepted: Orders

## 2024-05-20 NOTE — Telephone Encounter (Signed)
 Spoke with pt's wife who stated she had already spoke with Adrien, RN in our office and all of the information had already been dicussed with her. Pt is scheduled for an afib clinic appt for tomorrow 10/16. Adrien, RN discontinued the Hydralazine d/t headaches and sent in rx for Labetalol  200 mg BID for the pt. Pt and pt's wife had no further questions or concerns at this time.

## 2024-05-20 NOTE — Telephone Encounter (Addendum)
 Spoke with pt wife, aware according to the previous note, patient can stop the hydralazine and start labetalol  200 mg bid. New script sent to the pharmacy

## 2024-05-20 NOTE — Telephone Encounter (Signed)
Hey, can you help me with this?

## 2024-05-20 NOTE — Telephone Encounter (Signed)
 I did send a message yesterday regarding switching hydralazine (possible cause of headache) to labetalol . Has this been completed?  Thanks MJP

## 2024-05-20 NOTE — Telephone Encounter (Signed)
 Left message for pt to call.

## 2024-05-21 ENCOUNTER — Ambulatory Visit (HOSPITAL_COMMUNITY): Admitting: Physician Assistant

## 2024-05-22 ENCOUNTER — Telehealth: Payer: Self-pay | Admitting: Cardiology

## 2024-05-22 MED ORDER — LABETALOL HCL 200 MG PO TABS: 100.0000 mg | ORAL_TABLET | Freq: Two times a day (BID) | ORAL | 1 refills | Status: DC

## 2024-05-22 NOTE — Telephone Encounter (Signed)
 Pt c/o medication issue:  1. Name of Medication:   labetalol  (NORMODYNE ) 200 MG tablet    2. How are you currently taking this medication (dosage and times per day)? Take 1 tablet (200 mg total) by mouth 2 (two) times daily.   3. Are you having a reaction (difficulty breathing--STAT)? No  4. What is your medication issue? Pt blood pressure is very low. Pts wife is concerned   Pt c/o BP issue: STAT if pt c/o blurred vision, one-sided weakness or slurred speech.  STAT if BP is GREATER than 180/120 TODAY.  STAT if BP is LESS than 90/60 and SYMPTOMATIC TODAY  1. What is your BP concern? 82/50  2. Have you taken any BP medication today? Yes   3. What are your last 5 BP readings? N/a   4. Are you having any other symptoms (ex. Dizziness, headache, blurred vision, passed out)? Tingling in hands, cold feet, worsened neuropathy   Call transferred

## 2024-05-22 NOTE — Telephone Encounter (Signed)
 Agree with the recommendations as discussed above.  Thanks MJP

## 2024-05-22 NOTE — Telephone Encounter (Signed)
 Spoke with Patients wife Slater and reviewed new orders from Dr. Elmira to take it easy today and hydrate with water.  Instructed Slater that the labetalol  has been decreased to 100 mg twice a day and she reports understanding that this evenings dose will be 100 mg which is half of a tablet. Provided recommendations to take blood pressure before taking his labetalol  and 45 minutes after through the weekend to help inform if additional adjustments need to be made in the medication.  Lonni Glendia Richards RN, BSN, CCRN-A

## 2024-05-25 ENCOUNTER — Telehealth: Payer: Self-pay | Admitting: Cardiology

## 2024-05-25 NOTE — Telephone Encounter (Signed)
 Spoke with pt's wife per DPR regarding Dr. Parry suggestions. Wife verbalized understanding. All questions if any were answered.

## 2024-05-25 NOTE — Telephone Encounter (Signed)
   Cardiac Monitor Alert  Date of alert:  05/25/2024   Patient Name: [name]  DOB: 01/07/1946  MRN: [id number]   [facility] Cardiologist: [name], MD  [facility] EP:  None    Monitor Information: Cardiac Event Monitor [[employer]]  Reason:  Auto-trigger from monitor for bradycardia at 3:36 AM on 05/25/24 Ordering provider:  Jackee Alberts, NP   Alert Bradycardia- HR 35 bpm This is the 1st alert for this rhythm.   Next Cardiology Appointment   Date:  07/24/24  Provider:  Dr. Lawrence  The patient could NOT be reached by telephone today.  Left voicemail for the patient to call back to discuss a monitor alert. Two DPRs on file, one doesn't have anything checked regarding offices that are allowed to use DPR, the second one has only [facility] checked to be able to use that DPR. Arrhythmia, symptoms and history reviewed with Dr. Ladona.    Plan:  No changes at this time.    [name], [name]  05/25/2024 3:29 PM

## 2024-05-25 NOTE — Telephone Encounter (Signed)
 [name] [facility]) wondering if the labetalol  may be causing a flare up of his chf- he has had more coughing since starting on this medication. His BP is better though.  No increased swelling or shortness of breath. No chest pain or dizziness. His O2 sat is 96.  Coughing is a possible side effect of Labetalol . Informed that I would send this to his provider for further guidance.  Given ER precautions. She verbalized understanding.

## 2024-05-25 NOTE — Telephone Encounter (Signed)
 [name] with [facility] calling to report critical EKG

## 2024-05-25 NOTE — Telephone Encounter (Signed)
 Wife Theresa) wants a call back to discuss patient's medication as she states it may be triggering his CHF.

## 2024-05-27 ENCOUNTER — Ambulatory Visit (HOSPITAL_BASED_OUTPATIENT_CLINIC_OR_DEPARTMENT_OTHER): Payer: Self-pay | Admitting: Pulmonary Disease

## 2024-05-27 NOTE — Telephone Encounter (Signed)
 FYI Only or Action Required?: FYI only for provider.  Patient is followed in Pulmonology for OSA and bronchiectasis, last seen on 04/23/2023 by Jude Harden GAILS, MD.  Called Nurse Triage reporting Cough.  Symptoms began 3 days ago.  Symptoms are: unchanged.  Triage Disposition: Home Care  Patient/caregiver understands and will follow disposition?: No, wishes to speak with PCP  Appointment made per patient's request      Copied from CRM #[id number]. Topic: Clinical - Red Word Triage >> May 27, 2024  3:01 PM Leila BROCKS wrote: Red Word that prompted transfer to Nurse Triage: Patient [phone] states started blood pressure Labetalol  (Normodyne ) medication and developed side effects- constant coughing taking all his enegry, fatigue and high blood pressure. Patient's cardiologist toke patient off the medication and advised patient to consult with Dr. Jude. Patient denies shortness of breath, wheezing, dizziness, vision issues, pain, nor fever. Please advise.     Reason for Disposition  Cough  Answer Assessment - Initial Assessment Questions 1. ONSET: When did the cough begin?      3 days ago  2. SEVERITY: How bad is the cough today?      Mild to moderate  3. SPUTUM: Describe the color of your sputum (e.g., none, dry cough; clear, white, yellow, green)     No 4. HEMOPTYSIS: Are you coughing up any blood? If Yes, ask: How much? (e.g., flecks, streaks, tablespoons, etc.)     No 5. DIFFICULTY BREATHING: Are you having difficulty breathing? If Yes, ask: How bad is it? (e.g., mild, moderate, severe)      No 6. FEVER: Do you have a fever? If Yes, ask: What is your temperature, how was it measured, and when did it start?     No 7. CARDIAC HISTORY: Do you have any history of heart disease? (e.g., heart attack, congestive heart failure)      Yes 8. LUNG HISTORY: Do you have any history of lung disease?  (e.g., pulmonary embolus, asthma, emphysema)     Yes 9. PE RISK  FACTORS: Do you have a history of blood clots? (or: recent major surgery, recent prolonged travel, bedridden)     No 10. OTHER SYMPTOMS: Do you have any other symptoms? (e.g., runny nose, wheezing, chest pain)       Increased fatigue  Protocols used: Cough - Acute Non-Productive-A-AH

## 2024-05-28 ENCOUNTER — Ambulatory Visit (INDEPENDENT_AMBULATORY_CARE_PROVIDER_SITE_OTHER): Admitting: Internal Medicine

## 2024-05-28 ENCOUNTER — Encounter: Payer: Self-pay | Admitting: Internal Medicine

## 2024-05-28 VITALS — BP 118/62 | HR 58 | Temp 98.1°F | Ht 70.0 in | Wt 192.6 lb

## 2024-05-28 DIAGNOSIS — R051 Acute cough: Secondary | ICD-10-CM | POA: Diagnosis not present

## 2024-05-28 DIAGNOSIS — Z8679 Personal history of other diseases of the circulatory system: Secondary | ICD-10-CM | POA: Diagnosis not present

## 2024-05-28 DIAGNOSIS — G4733 Obstructive sleep apnea (adult) (pediatric): Secondary | ICD-10-CM | POA: Diagnosis not present

## 2024-05-28 DIAGNOSIS — R0989 Other specified symptoms and signs involving the circulatory and respiratory systems: Secondary | ICD-10-CM

## 2024-05-28 DIAGNOSIS — R059 Cough, unspecified: Secondary | ICD-10-CM

## 2024-05-28 LAB — NITRIC OXIDE: Nitric Oxide: 8

## 2024-05-28 MED ORDER — BENZONATATE 100 MG PO CAPS: 100.0000 mg | ORAL_CAPSULE | Freq: Two times a day (BID) | ORAL | 1 refills | Status: DC | PRN

## 2024-05-28 MED ORDER — AZELASTINE HCL 0.1 % NA SOLN: 2.0000 | Freq: Two times a day (BID) | NASAL | 12 refills | Status: AC

## 2024-05-28 NOTE — Progress Notes (Signed)
 78 yo for FU of obstructive sleep apnea and mild bronchiectasis .  -He was on oxygen  until 07/2021 when we discontinued   PMH - difficult to control hypertension 03/2021 enterococcal UTI, C. 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 Blood pressure is high today, he denies headache or blurred vision.  He is under a remote monitoring program. He underwent rotator cuff surgery and mobility is slowly improving with physical therapy. He denies any problems with mask or pressure CPAP is maintained at 12 cm.  He reports good compliance       07/2021 ONO on CPAP/room air shows minimal desaturation for 4 minutes total less than 88%.   PSG 02/2002 - mild OSA with AHI 16/h, lowest satn of 70% >> CPAP 9   10/30/2011 SPlit study showed O2 still required -esp during REM sleep  Increased CpAP to 12 cm , Keep 3 L O2 blended in    07/2016 PFTs nml   03/11/21 CTA chest did not show pulm embolism, showed bibasilar atelectasis and scarring with a small left pleural effusion, prominent mediastinal lymphadenopathy similar to 2016   OV 05/28/2024  Subjective:  Patient ID: Tess KATHEE Robson, male , DOB: [birth year] , age 78 y.o. , MRN: [id number] , ADDRESS: [address] [place] [address] PCP Clarice Nottingham, MD Patient Care Team: Clarice Nottingham, MD as PCP - General (Internal Medicine) Elmira Newman PARAS, MD as PCP - Cardiology (Cardiology)  This Provider for this visit: Treatment Team:  Attending Provider: Geronimo Amel, MD    05/28/2024 -   Chief Complaint  Patient presents with   Acute Visit    Pt stated started new blood pressure ( labetalol ) medications and developed a Dry cough, cardio Dr took pt off and pt still doesn't feel any better       HPI BRAXDON GAPPA 78 y.o. -eddy B Liska is an 78 year old male with congestive heart failure and sleep apnea who presents with a  persistent cough after starting labetalol .  He has a persistent dry cough that began after starting labetalol  for hypertension. The medication was taken for two days, and the cough started two to three days later. Despite discontinuing labetalol  four days ago, the cough persists.   The cough is more pronounced in the morning after removing his CPAP device but eases when he lies on his left side or uses the CPAP at night. No wheezing, fever, chills, shortness of breath, or sinus drainage are present. He has a history of a similar reaction to another medication, which resolved with cough syrup and Allegra.  His past medical history includes congestive heart failure, managed by a cardiologist, and sleep apnea, managed with a CPAP device. He has no history of asthma. He is currently taking Allegra but reports no recent nasal symptoms.  Of note he did have BNP in August 2020 for and it was normal.  He had echocardiogram in September 2025 was normal.  This is based on external medical record review.  Wife recalls that he has had similar reaction in the past and was given over-the-counter cough syrup.  This time he seems to have crackles on exam which is new.    PFT     Latest Ref Rng & Units 07/18/2016    3:29 PM  PFT Results  FVC-Pre L 2.93   FVC-Predicted Pre % 66   FVC-Post L 2.89   FVC-Predicted Post %  65   Pre FEV1/FVC % % 79   Post FEV1/FCV % % 82   FEV1-Pre L 2.31   FEV1-Predicted Pre % 71   FEV1-Post L 2.37   DLCO uncorrected ml/min/mmHg 23.55   DLCO UNC% % 71   DLCO corrected ml/min/mmHg 24.12   DLCO COR %Predicted % 73   DLVA Predicted % 104   TLC L 5.69   TLC % Predicted % 79   RV % Predicted % 103     Latest Reference Range & Units 01/21/11 20:29 01/22/11 11:50 07/18/16 16:29 10/03/20 13:35  Pecan/Hickory Tree IgE kU/L   <0.10   IgE (Immunoglobulin E), Serum <115 kU/L   6   Allergen, D pternoyssinus,d7 kU/L   <0.10   Cat Dander kU/L   <0.10   Dog Dander kU/L   <0.10    [place] Grass kU/L   <0.10   [place] Grass kU/L   <0.10   Timothy Grass kU/L   <0.10   Cockroach kU/L   <0.10   Aspergillus fumigatus, m3 kU/L   <0.10   Allergen, Comm Silver Valrie, t9 kU/L   <0.10   Allergen, Cottonwood, t14 kU/L   <0.10   Elm IgE kU/L   <0.10   Allergen, Mulberry, t76 kU/L   <0.10   Allergen, Oak,t7 kU/L   <0.10   Common Ragweed kU/L   <0.10   Sheep Sorrel IgE kU/L   <0.10   Allergen, Mouse Urine Protein, e78 kU/L   <0.10   D. farinae kU/L   <0.10   Allergen, Cedar tree, t12 kU/L   <0.10   Box Elder IgE kU/L   <0.10   Rough Pigweed  IgE kU/L   <0.10   A-1 Antitrypsin, Ser 83 - 199 mg/dL   827   Anti Nuclear Antibody (ANA) NEGATIVE   NEGATIVE    ENA SSA (RO) Ab 0.0 - 0.9 AI    <0.2  ENA SSB (LA) Ab 0.0 - 0.9 AI    <0.2  IgG (Immunoglobin G), Serum 603 - 1,613 mg/dL    384  IgM (Immunoglobulin M), Srm 15 - 143 mg/dL    60  IgA/Immunoglobulin A, Serum 61 - 437 mg/dL    871  Influenza B IgG & IgM  REPORT        Latest Reference Range & Units 07/01/07 14:35 01/20/11 05:15 01/21/11 05:40 02/04/11 22:56 02/06/11 14:03 01/05/15 11:30 01/24/15 15:40 07/18/16 16:29 03/09/21 20:54 03/12/21 04:52 03/13/21 04:13 05/18/24 15:05  Eosinophils Absolute 0.0 - 0.5 K/uL 0.1 (L) 0.0 0.0 0.0 0.0 0.3 0.2 0.4 0.1 0.1 0.2 0.1  (L): Data is abnormally low  LAB RESULTS last 96 hours No results found.       has a past medical history of Afib (HCC), Cancer (HCC), Elevated diaphragm, Gout, Heart failure (HCC) (04/05/2021), History of prostate cancer (09/07/1991), Hyperlipidemia, Hypertension, Hypoxemia, Normocytic anemia, OSA (obstructive sleep apnea), Peripheral neuropathy, Pneumonia, and Vision abnormalities.   reports that he has never smoked. He has never used smokeless tobacco.  Past Surgical History:  Procedure Laterality Date   APPENDECTOMY     CARDIOVERSION N/A 04/24/2024   Procedure: CARDIOVERSION;  Surgeon: Francyne Headland, MD;  Location: [facility];  Service:  Cardiovascular;  Laterality: N/A;   PROSTATECTOMY     SHOULDER SURGERY     bone spur removed from right    Allergies  Allergen Reactions   Doxycycline  Hives and Itching    Hives and itching   Penicillins Swelling  Swelling, rash   Azathioprine Other (See Comments)    Nausea   Mycophenolate Mofetil Cough   Azithromycin  Other (See Comments)    Other reaction(s): abdominal pain   Codeine Other (See Comments)     nausea   Sulfa Antibiotics Rash    Immunization History  Administered Date(s) Administered   Fluad Quad(high Dose 65+) 04/28/2019, 04/17/2022   INFLUENZA, HIGH DOSE SEASONAL PF 05/27/2014, 05/05/2015, 05/09/2016, 04/09/2024   Influenza Split 04/26/2011, 04/29/2012   Influenza Whole 05/09/2016   Influenza, Quadrivalent, Recombinant, Inj, Pf 05/20/2018, 05/02/2020, 05/08/2021   Influenza,inj,Quad PF,6+ Mos 05/05/2013   Influenza-Unspecified 06/02/2014, 04/06/2018, 04/18/2023   Moderna Sars-Covid-2 Vaccination 09/10/2019, 10/06/2019, 05/29/2020, 11/16/2020   Pneumococcal Conjugate-13 04/25/2009, 03/16/2014   Pneumococcal Polysaccharide-23 06/03/2012   Td 12/02/2003   Tdap 02/27/2013   Zoster Recombinant(Shingrix) 07/04/2020, 10/03/2020    Family History  Problem Relation Age of Onset   Emphysema Father    Congestive Heart Failure Mother    Emphysema Brother    Asthma Daughter      Current Outpatient Medications:    acetaminophen  (TYLENOL ) 325 MG tablet, Take 650 mg by mouth every 6 (six) hours as needed for mild pain, fever or headache., Disp: , Rfl:    allopurinol (ZYLOPRIM) 100 MG tablet, Take 150 mg by mouth daily., Disp: , Rfl:    apixaban  (ELIQUIS ) 5 MG TABS tablet, Take 1 tablet (5 mg total) by mouth 2 (two) times daily., Disp: 60 tablet, Rfl: 3   azelastine (ASTELIN) 0.1 % nasal spray, Place 2 sprays into both nostrils 2 (two) times daily. Use in each nostril as directed, Disp: 30 mL, Rfl: 12   benzonatate (TESSALON) 100 MG capsule, Take 1 capsule (100  mg total) by mouth 2 (two) times daily as needed for cough., Disp: 60 capsule, Rfl: 1   Cholecalciferol (D3 2000) 50 MCG (2000 UT) CAPS, Take 2,000 Units by mouth daily., Disp: , Rfl:    dicyclomine (BENTYL) 10 MG capsule, Take 10 mg by mouth as needed for spasms., Disp: , Rfl:    ezetimibe (ZETIA) 10 MG tablet, Take 10 mg by mouth daily., Disp: , Rfl:    famotidine  (PEPCID ) 20 MG tablet, Take 20 mg by mouth daily., Disp: , Rfl:    felodipine  (PLENDIL ) 10 MG 24 hr tablet, Take 10 mg by mouth daily., Disp: , Rfl:    fexofenadine (ALLEGRA) 60 MG tablet, Take 60 mg by mouth daily as needed for allergies or rhinitis., Disp: , Rfl:    fluorometholone (FML) 0.1 % ophthalmic suspension, Place 1 drop into both eyes 3 (three) times daily., Disp: , Rfl:    furosemide  (LASIX ) 40 MG tablet, Take 1 tablet (40 mg total) by mouth daily as needed., Disp: , Rfl:    ketorolac  (ACULAR ) 0.5 % ophthalmic solution, Place 1 drop into both eyes in the morning, at noon, in the evening, and at bedtime., Disp: , Rfl:    lamoTRIgine  (LAMICTAL ) 150 MG tablet, Take 2 tablets (300 mg total) by mouth 2 (two) times daily., Disp: 360 tablet, Rfl: 3   losartan  (COZAAR ) 100 MG tablet, Take 1 tablet (100 mg total) by mouth daily., Disp: 90 tablet, Rfl: 1   Multiple Vitamin (MULTIVITAMIN) tablet, Take 1 tablet by mouth daily., Disp: , Rfl:    primidone  (MYSOLINE ) 50 MG tablet, Take 1.5 tablets (75 mg total) by mouth 2 (two) times daily. TAKE ONE TABLET BY MOUTH EVERY MORNING AND TAKE ONE TABLET BY MOUTH EVERY NIGHT AT BEDTIME (Patient taking differently: Take  50 mg by mouth 2 (two) times daily. TAKE ONE TABLET BY MOUTH EVERY MORNING AND TAKE ONE TABLET BY MOUTH EVERY NIGHT AT BEDTIME), Disp: 270 tablet, Rfl: 1   propranolol  (INDERAL ) 10 MG tablet, Take 1 tablet (10 mg total) by mouth 2 (two) times daily., Disp: 180 tablet, Rfl: 3   rosuvastatin (CRESTOR) 20 MG tablet, Take 20 mg by mouth at bedtime., Disp: , Rfl:    spironolactone   (ALDACTONE ) 25 MG tablet, TAKE ONE TABLET BY MOUTH ONE TIME DAILY, Disp: 90 tablet, Rfl: 3   zolpidem  (AMBIEN ) 10 MG tablet, Take 5 mg by mouth at bedtime as needed for sleep., Disp: , Rfl:    labetalol  (NORMODYNE ) 200 MG tablet, Take 0.5 tablets (100 mg total) by mouth 2 (two) times daily. (Patient not taking: Reported on 05/28/2024), Disp: 180 tablet, Rfl: 1      Objective:   Vitals:   05/28/24 1327  BP: 118/62  Pulse: (!) 58  Temp: 98.1 F (36.7 C)  TempSrc: Oral  SpO2: 98%  Weight: 192 lb 9.6 oz (87.4 kg)  Height: 5' 10 (1.778 m)    Estimated body mass index is 27.64 kg/m as calculated from the following:   Height as of this encounter: 5' 10 (1.778 m).   Weight as of this encounter: 192 lb 9.6 oz (87.4 kg).  @WEIGHTCHANGE @  [employer]   05/28/24 1327  Weight: 192 lb 9.6 oz (87.4 kg)     Physical Exam   General: No distress. Looks well O2 at rest: no Cane present: no Sitting in wheel chair: no Frail: no Obese: non Neuro: Alert and Oriented x 3. GCS 15. Speech normal Psych: Pleasant Resp:  Barrel Chest - o.  Wheeze - no, Crackles - YES bilateral bibasal, No overt respiratory distress CVS: Normal heart sounds. Murmurs - no Ext: Stigmata of Connective Tissue Disease - no HEENT: Normal upper airway. PEERL +. No post nasal drip        Assessment/     Assessment & Plan Cough, unspecified type  History of chronic CHF  Bibasilar crackles  OSA (obstructive sleep apnea)    PLAN Patient Instructions     ICD-10-CM   1. Cough, unspecified type  R05.9 Nitric oxide    2. History of chronic CHF  Z86.79     3. Bibasilar crackles  R09.89     4. OSA (obstructive sleep apnea)  G47.33       Unclear reason for acute cough that has not subsided despite being off blood pressure medication for 3 days I do hear respiratory crackles on exam -but there is no evidence of  - Allergy  [blood eosinophil count normal this month and previously allergy  test  negative]  - Heart failure [echocardiogram normal in September 2025 and also blood test normal in August 2025]   Plan - We can hold off on any blood work because you have had blood work recently -Take Tessalon cough Perles 200 mg 3 times daily as needed - Start Astelin nasal spray  - Do high-resolution CT chest supine and prone in the next few weeks  - rule out ILD  Follow-up - Return to see Dr. Jude in 3 months    FOLLOWUP    Return for - Return to see Dr. Jude in 3 months.    SIGNATURE    Dr. Dorethia Cave, M.D., F.C.C.P,  Pulmonary and Critical Care Medicine Staff Physician, [facility] Director - Interstitial Lung Disease  Program  Pulmonary Fibrosis  [facility] at [place], [place], [address]  Pager: [phone], If no answer or between  15:00h - 7:00h: call [phone] Telephone: [phone]  2:04 PM 05/28/2024

## 2024-05-28 NOTE — Telephone Encounter (Signed)
 Pt has appt today at [facility]

## 2024-05-28 NOTE — Patient Instructions (Addendum)
 ICD-10-CM   1. Cough, unspecified type  R05.9 Nitric oxide    2. History of chronic CHF  Z86.79     3. Bibasilar crackles  R09.89     4. OSA (obstructive sleep apnea)  G47.33       Unclear reason for acute cough that has not subsided despite being off blood pressure medication for 3 days I do hear respiratory crackles on exam -but there is no evidence of  - Allergy  [blood eosinophil count normal this month and previously allergy  test negative]  - Heart failure [echocardiogram normal in September 2025 and also blood test normal in August 2025]   Plan - We can hold off on any blood work because you have had blood work recently -Take Tessalon cough Perles 200 mg 3 times daily as needed - Start Astelin nasal spray  - Do high-resolution CT chest supine and prone in the next few weeks  - rule out ILD  Follow-up - Return to see Dr. Jude in 3 months

## 2024-06-01 ENCOUNTER — Encounter: Payer: Self-pay | Admitting: Cardiology

## 2024-06-02 ENCOUNTER — Ambulatory Visit (HOSPITAL_COMMUNITY): Payer: Self-pay | Admitting: Nurse Practitioner

## 2024-06-02 NOTE — Progress Notes (Signed)
 Known paroxysmal Afib. Recent cardioversion, Afib seems to have returned but rate looks controlled. Continue current medications.  Thanks MJP

## 2024-06-03 ENCOUNTER — Ambulatory Visit (HOSPITAL_BASED_OUTPATIENT_CLINIC_OR_DEPARTMENT_OTHER)
Admission: RE | Admit: 2024-06-03 | Discharge: 2024-06-03 | Disposition: A | Discharge status: Home / Self Care | Source: Ambulatory Visit | Attending: Internal Medicine | Admitting: Internal Medicine

## 2024-06-03 DIAGNOSIS — R059 Cough, unspecified: Secondary | ICD-10-CM | POA: Diagnosis present

## 2024-06-03 DIAGNOSIS — G4733 Obstructive sleep apnea (adult) (pediatric): Secondary | ICD-10-CM | POA: Insufficient documentation

## 2024-06-03 DIAGNOSIS — R0989 Other specified symptoms and signs involving the circulatory and respiratory systems: Secondary | ICD-10-CM | POA: Insufficient documentation

## 2024-06-03 DIAGNOSIS — Z8679 Personal history of other diseases of the circulatory system: Secondary | ICD-10-CM | POA: Insufficient documentation

## 2024-06-04 ENCOUNTER — Encounter (HOSPITAL_BASED_OUTPATIENT_CLINIC_OR_DEPARTMENT_OTHER): Payer: Self-pay | Admitting: Pulmonary Disease

## 2024-06-05 NOTE — Telephone Encounter (Signed)
**Note De-identified  [place] Obfuscation** Please advise 

## 2024-06-08 ENCOUNTER — Encounter: Payer: Self-pay | Admitting: Adult Health

## 2024-06-08 ENCOUNTER — Ambulatory Visit (INDEPENDENT_AMBULATORY_CARE_PROVIDER_SITE_OTHER): Admitting: Adult Health

## 2024-06-08 VITALS — BP 142/84 | HR 61 | Temp 98.3°F | Ht 70.0 in | Wt 189.6 lb

## 2024-06-08 DIAGNOSIS — R053 Chronic cough: Secondary | ICD-10-CM | POA: Diagnosis not present

## 2024-06-08 DIAGNOSIS — J309 Allergic rhinitis, unspecified: Secondary | ICD-10-CM

## 2024-06-08 DIAGNOSIS — J310 Chronic rhinitis: Secondary | ICD-10-CM

## 2024-06-08 DIAGNOSIS — K219 Gastro-esophageal reflux disease without esophagitis: Secondary | ICD-10-CM

## 2024-06-08 DIAGNOSIS — I4891 Unspecified atrial fibrillation: Secondary | ICD-10-CM

## 2024-06-08 DIAGNOSIS — J479 Bronchiectasis, uncomplicated: Secondary | ICD-10-CM

## 2024-06-08 DIAGNOSIS — G4733 Obstructive sleep apnea (adult) (pediatric): Secondary | ICD-10-CM | POA: Diagnosis not present

## 2024-06-08 DIAGNOSIS — R052 Subacute cough: Secondary | ICD-10-CM

## 2024-06-08 MED ORDER — BENZONATATE 200 MG PO CAPS: 200.0000 mg | ORAL_CAPSULE | Freq: Three times a day (TID) | ORAL | 3 refills | Status: DC | PRN

## 2024-06-08 MED ORDER — PREDNISONE 10 MG PO TABS: ORAL_TABLET | ORAL | 0 refills | Status: DC

## 2024-06-08 NOTE — Patient Instructions (Addendum)
 Prednisone  taper over next week.  Delsym  2 tsp Twice daily  for cough As needed   Tessalon Three times a day  for cough As needed   Increase Pepcid  20mg  Twice daily  for 2 weeks and then daily  Saline nasal spray Twice daily   Saline nasal gel At bedtime   Sips of water to soothe throat and avoid throat clearing.  Continue on CPAP At bedtime   Follow up with Dr. Alva  as  planned in 2 months and As needed   Please contact office for sooner follow up if symptoms do not improve or worsen or seek emergency care

## 2024-06-08 NOTE — Progress Notes (Signed)
 @Patient  ID: [name], male    DOB: 04/22/1946, 78 y.o.   MRN: [id number]  Chief Complaint  Patient presents with   Acute Visit    Dry cough 2 1/2 weeks    Referring provider: Clarice Nottingham, MD  HPI: 78 yo male never followed for OSA and Mild Bronchiectasis  Previously on o2, stopped 07/2021  Chronic left hemidiaphragm elevation     TEST/EVENTS : Reviewed 06/08/2024  07/2021 ONO on CPAP/room air shows minimal desaturation for 4 minutes total less than 88%.   PSG 02/2002 - mild OSA with AHI 16/h, lowest satn of 70% >> CPAP 9   10/30/2011 SPlit study showed O2 still required -esp during REM sleep  Increased CpAP to 12 cm , Keep 3 L O2 blended in    07/2016 PFTs nml   03/11/21 CTA chest did not show pulm embolism, showed bibasilar atelectasis and scarring with a small left pleural effusion, prominent mediastinal lymphadenopathy similar to 2016  Discussed the use of AI scribe software for clinical note transcription with the patient, who gave verbal consent to proceed.  History of Present Illness [name] is an 78 year old male with bronchiectasis and sleep apnea who presents with a persistent cough.  He has been experiencing a persistent cough for over two weeks, which began after a change in his blood pressure medication to Labetalol . The medication was discontinued due to the cough, but the symptom has persisted. He uses cough drops and Tessalon Perles with minimal relief. The cough is non-productive with clear sputum when present. No shortness of breath or wheezing. No history of asthma.  Denies any fever.  High-resolution CT chest done on June 03, 2024 showed no evidence of pulmonary fibrosis.  Elevation of the hemidiaphragms left greater than right with associated atelectasis in the lung bases  Medical history significant for atrial fibrillation, for which he is on Eliquis . He uses a CPAP machine at night for sleep apnea, which helps with the cough during the  night.  His current medications include Eliquis  for atrial fibrillation and Pepcid  for reflux, taken once daily. He has not been on prednisone  before and has not received any recent antibiotics or other treatments for the cough. The cough is persistent throughout the day.  He uses a CPAP machine regularly for sleep apnea and reports that it can cause dryness, which he manages with saline nasal spray and gel. He also takes Allegra for allergies, which he notes can contribute to dryness.     Allergies  Allergen Reactions   Doxycycline  Hives and Itching    Hives and itching   Penicillins Swelling    Swelling, rash   Azathioprine Other (See Comments)    Nausea   Mycophenolate Mofetil Cough   Azithromycin  Other (See Comments)    Other reaction(s): abdominal pain   Codeine Other (See Comments)     nausea   Sulfa Antibiotics Rash    Immunization History  Administered Date(s) Administered   Fluad Quad(high Dose 65+) 04/28/2019, 04/17/2022   INFLUENZA, HIGH DOSE SEASONAL PF 05/27/2014, 05/05/2015, 05/09/2016, 04/09/2024   Influenza Split 04/26/2011, 04/29/2012   Influenza Whole 05/09/2016   Influenza, Quadrivalent, Recombinant, Inj, Pf 05/20/2018, 05/02/2020, 05/08/2021   Influenza,inj,Quad PF,6+ Mos 05/05/2013   Influenza-Unspecified 06/02/2014, 04/06/2018, 04/18/2023   Moderna Sars-Covid-2 Vaccination 09/10/2019, 10/06/2019, 05/29/2020, 11/16/2020   Pneumococcal Conjugate-13 04/25/2009, 03/16/2014   Pneumococcal Polysaccharide-23 06/03/2012   Td 12/02/2003   Tdap 02/27/2013   Zoster Recombinant(Shingrix) 07/04/2020, 10/03/2020  Past Medical History:  Diagnosis Date   Afib (HCC)    Cancer (HCC)    Elevated diaphragm    Gout    Heart failure (HCC) 04/05/2021   History of prostate cancer 09/07/1991   Hyperlipidemia    Hypertension    Hypoxemia    Normocytic anemia    OSA (obstructive sleep apnea)    Peripheral neuropathy    Pneumonia    Vision abnormalities      Tobacco History: Social History   Tobacco Use  Smoking Status Never  Smokeless Tobacco Never   Counseling given: Not Answered   Outpatient Medications Prior to Visit  Medication Sig Dispense Refill   acetaminophen  (TYLENOL ) 325 MG tablet Take 650 mg by mouth every 6 (six) hours as needed for mild pain, fever or headache.     allopurinol (ZYLOPRIM) 100 MG tablet Take 150 mg by mouth daily.     apixaban  (ELIQUIS ) 5 MG TABS tablet Take 1 tablet (5 mg total) by mouth 2 (two) times daily. 60 tablet 3   azelastine (ASTELIN) 0.1 % nasal spray Place 2 sprays into both nostrils 2 (two) times daily. Use in each nostril as directed 30 mL 12   Cholecalciferol (D3 2000) 50 MCG (2000 UT) CAPS Take 2,000 Units by mouth daily.     dicyclomine (BENTYL) 10 MG capsule Take 10 mg by mouth as needed for spasms.     ezetimibe (ZETIA) 10 MG tablet Take 10 mg by mouth daily.     famotidine  (PEPCID ) 20 MG tablet Take 20 mg by mouth daily.     felodipine  (PLENDIL ) 10 MG 24 hr tablet Take 10 mg by mouth daily.     fexofenadine (ALLEGRA) 60 MG tablet Take 60 mg by mouth daily as needed for allergies or rhinitis.     fluorometholone (FML) 0.1 % ophthalmic suspension Place 1 drop into both eyes 3 (three) times daily.     furosemide  (LASIX ) 40 MG tablet Take 1 tablet (40 mg total) by mouth daily as needed.     ketorolac  (ACULAR ) 0.5 % ophthalmic solution Place 1 drop into both eyes in the morning, at noon, in the evening, and at bedtime.     lamoTRIgine  (LAMICTAL ) 150 MG tablet Take 2 tablets (300 mg total) by mouth 2 (two) times daily. 360 tablet 3   losartan  (COZAAR ) 100 MG tablet Take 1 tablet (100 mg total) by mouth daily. 90 tablet 1   Multiple Vitamin (MULTIVITAMIN) tablet Take 1 tablet by mouth daily.     primidone  (MYSOLINE ) 50 MG tablet Take 1.5 tablets (75 mg total) by mouth 2 (two) times daily. TAKE ONE TABLET BY MOUTH EVERY MORNING AND TAKE ONE TABLET BY MOUTH EVERY NIGHT AT BEDTIME (Patient taking  differently: Take 50 mg by mouth 2 (two) times daily. TAKE ONE TABLET BY MOUTH EVERY MORNING AND TAKE ONE TABLET BY MOUTH EVERY NIGHT AT BEDTIME) 270 tablet 1   propranolol  (INDERAL ) 10 MG tablet Take 1 tablet (10 mg total) by mouth 2 (two) times daily. 180 tablet 3   rosuvastatin (CRESTOR) 20 MG tablet Take 20 mg by mouth at bedtime.     spironolactone  (ALDACTONE ) 25 MG tablet TAKE ONE TABLET BY MOUTH ONE TIME DAILY 90 tablet 3   zolpidem  (AMBIEN ) 10 MG tablet Take 5 mg by mouth at bedtime as needed for sleep.     benzonatate (TESSALON) 100 MG capsule Take 1 capsule (100 mg total) by mouth 2 (two) times daily as needed for cough. 60  capsule 1   labetalol  (NORMODYNE ) 200 MG tablet Take 0.5 tablets (100 mg total) by mouth 2 (two) times daily. (Patient not taking: Reported on 06/08/2024) 180 tablet 1   No facility-administered medications prior to visit.     Review of Systems:   Constitutional:   No  weight loss, night sweats,  Fevers, chills, fatigue, or  lassitude.  HEENT:   No headaches,  Difficulty swallowing,  Tooth/dental problems, or  Sore throat,                No sneezing, itching, ear ache, +nasal congestion, post nasal drip,   CV:  No chest pain,  Orthopnea, PND, swelling in lower extremities, anasarca, dizziness, palpitations, syncope.   GI  No heartburn, indigestion, abdominal pain, nausea, vomiting, diarrhea, change in bowel habits, loss of appetite, bloody stools.   Resp:    No coughing up of blood.  No change in color of mucus.  No wheezing.  No chest wall deformity  Skin: no rash or lesions.  GU: no dysuria, change in color of urine, no urgency or frequency.  No flank pain, no hematuria   MS:  No joint pain or swelling.  No decreased range of motion.  No back pain.    Physical Exam  BP (!) 142/84   Pulse 61   Temp 98.3 F (36.8 C) (Oral)   Ht 5' 10 (1.778 m) Comment: per patient  Wt 189 lb 9.6 oz (86 kg)   SpO2 95% Comment: on RA  BMI 27.20 kg/m   GEN:  A/Ox3; pleasant , NAD, well nourished    HEENT:  [place]/AT,   NOSE-clear, THROAT-clear, no lesions, no postnasal drip or exudate noted.   NECK:  Supple w/ fair ROM; no JVD; normal carotid impulses w/o bruits; no thyromegaly or nodules palpated; no lymphadenopathy.    RESP  Clear  P & A; w/o, wheezes/ rales/ or rhonchi. no accessory muscle use, no dullness to percussion  CARD:  RRR, no m/r/g, no peripheral edema, pulses intact, no cyanosis or clubbing.  GI:   Soft & nt; nml bowel sounds; no organomegaly or masses detected.   Musco: Warm bil, no deformities or joint swelling noted.   Neuro: alert, no focal deficits noted.    Skin: Warm, no lesions or rashes    Lab Results:Reviewed 06/08/2024   CBC    Component Value Date/Time   WBC 6.0 05/18/2024 1505   RBC 4.99 05/18/2024 1505   HGB 16.2 05/18/2024 1505   HGB 14.9 04/20/2024 1239   HCT 46.6 05/18/2024 1505   HCT 44.9 04/20/2024 1239   PLT 148 (L) 05/18/2024 1505   PLT 120 (L) 04/20/2024 1239   MCV 93.4 05/18/2024 1505   MCV 96 04/20/2024 1239   MCH 32.5 05/18/2024 1505   MCHC 34.8 05/18/2024 1505   RDW 13.8 05/18/2024 1505   RDW 14.4 04/20/2024 1239   LYMPHSABS 2.1 05/18/2024 1505   MONOABS 0.4 05/18/2024 1505   EOSABS 0.1 05/18/2024 1505   BASOSABS 0.0 05/18/2024 1505    BMET    Component Value Date/Time   NA 135 05/18/2024 1406   NA 140 04/20/2024 1239   K 4.6 05/18/2024 1406   CL 101 05/18/2024 1406   CO2 23 05/18/2024 1406   GLUCOSE 124 (H) 05/18/2024 1406   BUN 16 05/18/2024 1406   BUN 14 04/20/2024 1239   CREATININE 1.15 05/18/2024 1406   CALCIUM  9.5 05/18/2024 1406   GFRNONAA >60 05/18/2024 1406   GFRAA 67  04/13/2020 1104    BNP    Component Value Date/Time   BNP 55.9 04/14/2021 1457   BNP 257.0 (H) 03/17/2021 0508    ProBNP    Component Value Date/Time   PROBNP 277 03/23/2024 1214   PROBNP 3524.0 (H) 02/07/2011 0545    Imaging: CT Chest High Resolution Result Date: 06/05/2024 CLINICAL  DATA:  Respiratory illness, nondiagnostic x-ray, 3 weeks of cough EXAM: CT CHEST WITHOUT CONTRAST TECHNIQUE: Multidetector CT imaging of the chest was performed following the standard protocol without intravenous contrast. High resolution imaging of the lungs, as well as inspiratory and expiratory imaging, was performed. RADIATION DOSE REDUCTION: This exam was performed according to the departmental dose-optimization program which includes automated exposure control, adjustment of the mA and/or kV according to patient size and/or use of iterative reconstruction technique. COMPARISON:  CT chest angiogram, 03/11/2021 FINDINGS: Cardiovascular: Aortic atherosclerosis. Normal heart size. Three-vessel coronary artery calcifications. No pericardial effusion. Mediastinum/Nodes: No enlarged mediastinal, hilar, or axillary lymph nodes. Thyroid  gland, trachea, and esophagus demonstrate no significant findings. Lungs/Pleura: Elevation of the hemidiaphragms, left-greater-than-right with associated scarring or atelectasis of the lung bases. No evidence of pulmonary fibrosis. No significant air trapping on expiratory phase imaging. No pleural effusion or pneumothorax. Upper Abdomen: No acute abnormality. Musculoskeletal: No chest wall abnormality. No acute osseous findings. Nondisplaced subacute fractures of the posterior left ribs (series 302, image 84). IMPRESSION: 1. Elevation of the hemidiaphragms, left-greater-than-right with associated scarring or atelectasis of the lung bases, bland sequelae of prior infection or aspiration. No evidence of pulmonary fibrosis. 2. Subacute fractures of the posterior left ribs. 3. Coronary artery disease. Aortic Atherosclerosis (ICD10-I70.0). Electronically Signed   By: Marolyn JONETTA Jaksch M.D.   On: 06/05/2024 16:36   CT Head Wo Contrast Result Date: 05/18/2024 CLINICAL DATA:  Frontal headache. EXAM: CT HEAD WITHOUT CONTRAST TECHNIQUE: Contiguous axial images were obtained from the base of the  skull through the vertex without intravenous contrast. RADIATION DOSE REDUCTION: This exam was performed according to the departmental dose-optimization program which includes automated exposure control, adjustment of the mA and/or kV according to patient size and/or use of iterative reconstruction technique. COMPARISON:  March 09, 2021 FINDINGS: Brain: There is generalized cerebral atrophy with widening of the extra-axial spaces and ventricular dilatation. There are areas of decreased attenuation within the white matter tracts of the supratentorial brain, consistent with microvascular disease changes. Vascular: No hyperdense vessel or unexpected calcification. Skull: Normal. Negative for fracture or focal lesion. Sinuses/Orbits: No acute finding. Other: None. IMPRESSION: 1. Generalized cerebral atrophy and microvascular disease changes of the supratentorial brain. 2. No acute intracranial abnormality. Electronically Signed   By: Suzen Dials M.D.   On: 05/18/2024 14:47    Administration History     None          Latest Ref Rng & Units 07/18/2016    3:29 PM  PFT Results  FVC-Pre L 2.93   FVC-Predicted Pre % 66   FVC-Post L 2.89   FVC-Predicted Post % 65   Pre FEV1/FVC % % 79   Post FEV1/FCV % % 82   FEV1-Pre L 2.31   FEV1-Predicted Pre % 71   FEV1-Post L 2.37   DLCO uncorrected ml/min/mmHg 23.55   DLCO UNC% % 71   DLCO corrected ml/min/mmHg 24.12   DLCO COR %Predicted % 73   DLVA Predicted % 104   TLC L 5.69   TLC % Predicted % 79   RV % Predicted % 103     Lab  Results  Component Value Date   NITRICOXIDE 8 05/28/2024        No data to display              Assessment & Plan:   Assessment and Plan Assessment & Plan Chronic cough with upper airway irritation  -not improved ongoing The chronic cough has persisted for over two weeks, likely due to upper airway irritation, possibly exacerbated by dryness and postnasal drip possible reflux.  It began after starting  labetalol , which was discontinued. A CT scan showed no fibrosis, pneumonia, or severe bronchitis. . Prescribe a prednisone  taper: 40 mg for 2 days, 30 mg for 2 days, 20 mg for 2 days, and 10 mg for 2 days. Recommend Delsym  cough syrup twice daily. Continue Tessalon Perles, increasing the dose to 200 mg three times daily. Increase Pepcid  to twice daily for two weeks, then return to once daily. Advise using saline nasal spray and gel for dryness. Encourage sips of water and avoidance of mint products. Advise against prolonged talking, singing, and whispering. Monitor for improvement over the next 6 weeks.  Allergic rhinitis possible flare with increased cough Allergic rhinitis is managed with Allegra, which may contribute to upper airway irritation and cough. Continue Allegra and use saline nasal spray for nasal dryness.  Obstructive sleep apnea appears stable with perceived benefit. Obstructive sleep apnea is managed with consistent CPAP use, which causes dryness. Continue CPAP use and consider saline nasal gel for dryness associated with CPAP.  Gastroesophageal reflux disease possibly increased with cough flare. Gastroesophageal reflux disease is managed with Pepcid . Increase Pepcid  to twice daily for two weeks, then return to once daily.  Atrial fibrillation appears stable Atrial fibrillation is managed with Eliquis .    Plan  Patient Instructions  Prednisone  taper over next week.  Delsym  2 tsp Twice daily  for cough As needed   Tessalon Three times a day  for cough As needed   Increase Pepcid  20mg  Twice daily  for 2 weeks and then daily  Saline nasal spray Twice daily   Saline nasal gel At bedtime   Sips of water to soothe throat and avoid throat clearing.  Continue on CPAP At bedtime   Follow up with Dr. Alva  as  planned in 2 months and As needed   Please contact office for sooner follow up if symptoms do not improve or worsen or seek emergency care            Madelin Stank,  NP 06/08/2024

## 2024-06-09 ENCOUNTER — Ambulatory Visit (HOSPITAL_COMMUNITY): Admitting: Physician Assistant

## 2024-06-11 ENCOUNTER — Ambulatory Visit (HOSPITAL_COMMUNITY)
Admission: RE | Admit: 2024-06-11 | Discharge: 2024-06-11 | Disposition: A | Discharge status: Home / Self Care | Source: Ambulatory Visit | Attending: Physician Assistant | Admitting: Physician Assistant

## 2024-06-11 VITALS — BP 182/92 | HR 57 | Ht 70.0 in | Wt 187.8 lb

## 2024-06-11 DIAGNOSIS — I4891 Unspecified atrial fibrillation: Secondary | ICD-10-CM

## 2024-06-11 DIAGNOSIS — I10 Essential (primary) hypertension: Secondary | ICD-10-CM

## 2024-06-11 DIAGNOSIS — I4819 Other persistent atrial fibrillation: Secondary | ICD-10-CM | POA: Diagnosis not present

## 2024-06-11 DIAGNOSIS — D6869 Other thrombophilia: Secondary | ICD-10-CM | POA: Diagnosis not present

## 2024-06-11 NOTE — Progress Notes (Signed)
 Primary Care Physician: Clarice Nottingham, MD Primary Cardiologist: [name], MD Electrophysiologist: None  Referring Physician: Dr Lawrence Tess [name] is a 78 y.o. male with a history of CHF, essential tremor, prostate cancer, HTN, atrial fibrillation who presents for follow up in the [facility].  The patient was initially diagnosed with atrial fibrillation 03/2024 at a routine follow up with Dr Lawrence. Patient was asymptomatic at that time. He underwent DCCV on 04/24/24. An event monitor was placed post DCCV to monitor for recurrence. [facility] received an alert 05/19/24 for recurrent afib, rate controlled. Patient is on Eliquis  for stroke prevention.    Patient presents today for follow up for atrial fibrillation. He is in SR today and feels well. He reports that he does not have any symptoms associated with his afib. No bleeding issues on anticoagulation. His BP readings have remained elevated.   Today, he denies symptoms of palpitations, chest pain, shortness of breath, orthopnea, PND, lower extremity edema, dizziness, presyncope, syncope, bleeding, or neurologic sequela. The patient is tolerating medications without difficulties and is otherwise without complaint today.    Atrial Fibrillation Risk Factors:  he does have symptoms or diagnosis of sleep apnea. he does not have a history of rheumatic fever.   Atrial Fibrillation Management history:  Previous antiarrhythmic drugs: none Previous cardioversions: 04/24/24 Previous ablations: none Anticoagulation history: Eliquis   ROS- All systems are reviewed and negative except as per the HPI above.  Past Medical History:  Diagnosis Date   Afib (HCC)    Cancer (HCC)    Elevated diaphragm    Gout    Heart failure (HCC) 04/05/2021   History of prostate cancer 09/07/1991   Hyperlipidemia    Hypertension    Hypoxemia    Normocytic anemia    OSA (obstructive sleep apnea)     Peripheral neuropathy    Pneumonia    Vision abnormalities     Current Outpatient Medications  Medication Sig Dispense Refill   acetaminophen  (TYLENOL ) 325 MG tablet Take 650 mg by mouth every 6 (six) hours as needed for mild pain, fever or headache. (Patient taking differently: Take 650 mg by mouth as needed for mild pain (pain score 1-3), fever or headache.)     allopurinol (ZYLOPRIM) 100 MG tablet Take 150 mg by mouth daily. (Patient taking differently: Take 100 mg by mouth daily.)     apixaban  (ELIQUIS ) 5 MG TABS tablet Take 1 tablet (5 mg total) by mouth 2 (two) times daily. 60 tablet 3   azelastine (ASTELIN) 0.1 % nasal spray Place 2 sprays into both nostrils 2 (two) times daily. Use in each nostril as directed 30 mL 12   benzonatate (TESSALON) 200 MG capsule Take 1 capsule (200 mg total) by mouth 3 (three) times daily as needed. 45 capsule 3   Cholecalciferol (D3 2000) 50 MCG (2000 UT) CAPS Take 2,000 Units by mouth daily. (Patient taking differently: Take 5,000 Units by mouth daily.)     Dextromethorphan  HBr (DELSYM  PO) Take by mouth 2 (two) times daily.     dicyclomine (BENTYL) 10 MG capsule Take 10 mg by mouth as needed for spasms.     ezetimibe (ZETIA) 10 MG tablet Take 10 mg by mouth daily.     famotidine  (PEPCID ) 20 MG tablet Take 20 mg by mouth daily. (Patient taking differently: Take 20 mg by mouth in the morning and at bedtime.)     felodipine  (PLENDIL ) 10 MG 24 hr tablet  Take 10 mg by mouth daily.     fexofenadine (ALLEGRA) 60 MG tablet Take 60 mg by mouth daily as needed for allergies or rhinitis.     fluorometholone (FML) 0.1 % ophthalmic suspension Place 1 drop into both eyes 3 (three) times daily.     furosemide  (LASIX ) 40 MG tablet Take 1 tablet (40 mg total) by mouth daily as needed. (Patient taking differently: Take 40 mg by mouth as needed.)     ketorolac  (ACULAR ) 0.5 % ophthalmic solution Place 1 drop into both eyes in the morning, at noon, in the evening, and at  bedtime.     lamoTRIgine  (LAMICTAL ) 150 MG tablet Take 2 tablets (300 mg total) by mouth 2 (two) times daily. 360 tablet 3   losartan  (COZAAR ) 100 MG tablet Take 1 tablet (100 mg total) by mouth daily. 90 tablet 1   Multiple Vitamin (MULTIVITAMIN) tablet Take 1 tablet by mouth daily.     predniSONE  (DELTASONE ) 10 MG tablet 4 tabs for 2 days, then 3 tabs for 2 days, 2 tabs for 2 days, then 1 tab for 2 days, then stop 20 tablet 0   primidone  (MYSOLINE ) 50 MG tablet Take 1.5 tablets (75 mg total) by mouth 2 (two) times daily. TAKE ONE TABLET BY MOUTH EVERY MORNING AND TAKE ONE TABLET BY MOUTH EVERY NIGHT AT BEDTIME (Patient taking differently: Take 50 mg by mouth 2 (two) times daily. TAKE ONE TABLET BY MOUTH EVERY MORNING AND TAKE ONE TABLET BY MOUTH EVERY NIGHT AT BEDTIME) 270 tablet 1   propranolol  (INDERAL ) 10 MG tablet Take 1 tablet (10 mg total) by mouth 2 (two) times daily. 180 tablet 3   rosuvastatin (CRESTOR) 20 MG tablet Take 20 mg by mouth at bedtime.     spironolactone  (ALDACTONE ) 25 MG tablet TAKE ONE TABLET BY MOUTH ONE TIME DAILY 90 tablet 3   zolpidem  (AMBIEN ) 10 MG tablet Take 5 mg by mouth at bedtime as needed for sleep.     No current facility-administered medications for this encounter.    Physical Exam: BP (!) 182/92   Pulse (!) 57   Ht 5' 10 (1.778 m)   Wt 85.2 kg   BMI 26.95 kg/m   GEN: Well nourished, well developed in no acute distress CARDIAC: Regular rate and rhythm, no murmurs, rubs, gallops RESPIRATORY:  Clear to auscultation without rales, wheezing or rhonchi  ABDOMEN: Soft, non-tender, non-distended EXTREMITIES:  No edema; No deformity   Wt Readings from Last 3 Encounters:  06/11/24 85.2 kg  06/08/24 86 kg  05/28/24 87.4 kg     EKG today demonstrates  SB, 1st degree AV block Vent. rate 57 BPM PR interval 210 ms QRS duration 94 ms QT/QTcB 420/408 ms   Echo 04/09/24 demonstrated   1. Left ventricular ejection fraction, by estimation, is 60 to 65%.  The  left ventricle has normal function. The left ventricle has no regional  wall motion abnormalities. Left ventricular diastolic function could not  be evaluated.   2. Right ventricular systolic function is normal. The right ventricular  size is normal. There is normal pulmonary artery systolic pressure.   3. Left atrial size was moderately dilated.   4. The mitral valve is normal in structure. Mild mitral valve  regurgitation. No evidence of mitral stenosis.   5. The aortic valve is tricuspid. There is mild calcification of the  aortic valve. Aortic valve regurgitation is trivial. No aortic stenosis is  present.   6. The inferior vena cava  is normal in size with greater than 50%  respiratory variability, suggesting right atrial pressure of 3 mmHg.    CHA2DS2-VASc Score = 3  The patient's score is based upon: CHF History: 0 HTN History: 1 Diabetes History: 0 Stroke History: 0 Vascular Disease History: 0 Age Score: 2 Gender Score: 0       ASSESSMENT AND PLAN: Persistent Atrial Fibrillation (ICD10:  I48.19) The patient's CHA2DS2-VASc score is 3, indicating a 3.2% annual risk of stroke.   S/p DCCV 04/24/24 with quick return of afib.  We discussed rhythm control options today. Would avoid class IC with coronary calcifications on CT. Can consider dofetilide but would need to stop lamotrigine . Amiodarone or ablation could also be options. Will wait for monitor results. If low burden and rate controlled, would recommend continuing current medications.  Continue Eliquis  5 mg BID. Recommend he discuss stopping primidone  with neurology given interaction with Eliquis .  Continue propranolol  10 mg BID  Secondary Hypercoagulable State (ICD10:  D68.69) The patient is at significant risk for stroke/thromboembolism based upon his CHA2DS2-VASc Score of 3.  Continue Apixaban  (Eliquis ). No bleeding issues.   HTN Elevated today, recently had to stop hydralazine and labetalol  due to side effects.  Will refer to HTN Clinic.   OSA  Encouraged nightly CPAP  Chronic HFpEF EF 60-65% GDMT per primary cardiology team Fluid status appears stable today  CAD/aortic atherosclerosis  Three vessel CAD and aortic atherosclerosis noted on CT  No anginal symptoms Followed by Dr Elmira   Follow up with Dr Elmira as scheduled.     [facility] [address] [place],  [id number] [phone]

## 2024-06-15 ENCOUNTER — Telehealth: Payer: Self-pay | Admitting: Neurology

## 2024-06-15 NOTE — Telephone Encounter (Signed)
 Pt called to speak to NP about medication changes or possibly stop taking mediation  . Pt cardiologist has prescribe  Pt apixaban  (ELIQUIS ) 5 MG TABS tablet and Cardiologist is requesting for Neurologist  to either find a different medication for Pt or stop using medication .  Right now pt is taking primidone  (MYSOLINE ) 50 MG tablet  which Cardiologist stated doesn't work well with apixaban  (ELIQUIS ) 5 MG TABS tablet

## 2024-06-16 ENCOUNTER — Ambulatory Visit: Payer: Self-pay | Admitting: Internal Medicine

## 2024-06-16 DIAGNOSIS — J986 Disorders of diaphragm: Secondary | ICD-10-CM

## 2024-06-16 NOTE — Telephone Encounter (Signed)
 Spoke with patient and relayed message from Dr Vear. Pt verbalized understanding. He mentioned that he has a new dx of A-fib. He underwent cardioversion and was placed on Eliquis . Also having trouble with BP control. He has not tolerated a few particular medications. Will be visiting HTN clinic soon. Pt concerned that his primidone  tab is too small to split equally so he asked how he needs to take it (currently 50 mg BID). I also offered a f/u appt for 11/24 but he is unable to make it. Will check with Dr Vear and offer a different day.

## 2024-06-16 NOTE — Telephone Encounter (Signed)
     He did a sniff test last in 2012. 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 Elevation of the hemidiaphragms, left-greater-than-right with associated scarring or atelectasis of the lung bases, bland sequelae of prior infection or aspiration. No evidence of pulmonary fibrosis. 2. Subacute fractures of the posterior left ribs. 3. Coronary artery disease.   Aortic Atherosclerosis (ICD10-I70.0).     Electronically Signed   By: Marolyn JONETTA Jaksch M.D.   On: 06/05/2024 16:36

## 2024-06-17 NOTE — Telephone Encounter (Signed)
 Spoke with patient and informed him that Dr Vear is ok with pt taking primodone 50 mg tablet once at night x 1 week then discontinue. He was also scheduled for the next available appt I was able to find before the end of the year with Dr Vear, November 19th at 1030 am, arrive 1015. Pt thanked me for the call.

## 2024-06-17 NOTE — Progress Notes (Signed)
 Results discussed at 06/08/24 ov

## 2024-06-17 NOTE — Telephone Encounter (Signed)
 Updated MAR with primidone  tapering instructions.

## 2024-06-22 ENCOUNTER — Other Ambulatory Visit (HOSPITAL_BASED_OUTPATIENT_CLINIC_OR_DEPARTMENT_OTHER): Payer: Self-pay

## 2024-06-22 ENCOUNTER — Other Ambulatory Visit: Payer: Self-pay | Admitting: Cardiology

## 2024-06-22 ENCOUNTER — Other Ambulatory Visit (HOSPITAL_COMMUNITY): Payer: Self-pay

## 2024-06-22 MED ORDER — APIXABAN 5 MG PO TABS
5.0000 mg | ORAL_TABLET | Freq: Two times a day (BID) | ORAL | 5 refills | Status: DC
  Filled 2024-06-22: qty 60, 30d supply, fill #0

## 2024-06-22 NOTE — Telephone Encounter (Signed)
 Prescription refill request for Eliquis  received. Indication:afib Last office visit:11/25 Scr:1.15  10/25 Age: 78 Weight:85.2  kg  Prescription refilled

## 2024-06-23 ENCOUNTER — Encounter: Payer: Self-pay | Admitting: Cardiology

## 2024-06-24 ENCOUNTER — Ambulatory Visit (INDEPENDENT_AMBULATORY_CARE_PROVIDER_SITE_OTHER): Admitting: Neurology

## 2024-06-24 ENCOUNTER — Encounter: Payer: Self-pay | Admitting: Neurology

## 2024-06-24 VITALS — BP 143/93 | HR 71 | Ht 71.0 in | Wt 191.0 lb

## 2024-06-24 DIAGNOSIS — M791 Myalgia, unspecified site: Secondary | ICD-10-CM

## 2024-06-24 DIAGNOSIS — Z7901 Long term (current) use of anticoagulants: Secondary | ICD-10-CM

## 2024-06-24 DIAGNOSIS — R208 Other disturbances of skin sensation: Secondary | ICD-10-CM | POA: Diagnosis not present

## 2024-06-24 DIAGNOSIS — G250 Essential tremor: Secondary | ICD-10-CM | POA: Diagnosis not present

## 2024-06-24 DIAGNOSIS — I4819 Other persistent atrial fibrillation: Secondary | ICD-10-CM

## 2024-06-24 DIAGNOSIS — G629 Polyneuropathy, unspecified: Secondary | ICD-10-CM | POA: Diagnosis not present

## 2024-06-24 NOTE — Progress Notes (Signed)
 [facility]  PATIENT: [name] DOB: November 16, 1945  REFERRING CLINICIAN: Ryan Hives HISTORY FROM: Patient REASON FOR VISIT: Polyneuropathy and tremors   HISTORICAL  CHIEF COMPLAINT:  Chief Complaint  Patient presents with   Follow-up    Pt in room 11. Alone. Here for discuss alterative medication from  primidone .    HISTORY OF PRESENT ILLNESS:  [name] is a 78 y.o. 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  Update 06/24/2024 HE was started on Eliquis  for AFib. He had cardioversion.   THere is a drug interaction with primidone .  We have cut the dose to 50 mg po bid and he will cut in half for a few days and then stop.   We disccussed that if cardiology is comfortable increasing the beta locker *currently low dose propranolol  10 mg  po bid) that may help the tremor  He feels his polyneuropathy pain is worse.  The worst pain is actually in the thighs not in the feet.   Sometimes this is intense 8/10 pain.    We discussed would not be typical for neuropathy.  .  Lamotrigine  helps the dysesthesias but is on maximum dose of 300 mg po bid..    Neurontin and a tricyclic antidepressant did not help the pain. Blood work including B12, cryoglobulins and SPEP/IEF had been essentially normal in the past, last tested in 2013.  There is no muscle tenderness.  He has been on Crestor many years.   CK was normal in 2016  Nerve conduction study at symptom onset was mildly abnormal but 2016 study showed good functioning of the large fibers.  He has good DTRs.   MRI cervical spine was normal for age in past - spinal cord normal, mild DJD no spinal stenosis  His gait is mildly off balance, esp right ater standing up.  Gait has slowed.   He has had no further episode of presyncope.  He tries to stay active and walks some daily.    He tries to walk 1/4 mile a few times a week. And to remain active.   No further HA.    He has HTN and sees  cardiology for this as well.  BP was higher than optimum today.     REVIEW OF SYSTEMS:  Constitutional: No fevers, chills, sweats, or change in appetite Eyes: No visual changes, double vision, eye pain Ear, nose and throat: No hearing loss, ear pain, nasal congestion, sore throat Cardiovascular: No chest pain, palpitations Respiratory:  No shortness of breath at rest or with exertion.   No wheezes   He has OSA and is on CPAP GastrointestinaI: No nausea, vomiting, diarrhea, abdominal pain, fecal incontinence Genitourinary:  No dysuria, urinary retention or frequency.  No nocturia. Musculoskeletal:  No neck pain, back pain Integumentary: No rash, pruritus, skin lesions Neurological: as above Psychiatric: No depression at this time.  No anxiety Endocrine: No palpitations, diaphoresis, change in appetite, change in weigh or increased thirst Hematologic/Lymphatic:  No anemia, purpura, petechiae. Allergic/Immunologic: No itchy/runny eyes, nasal congestion, recent allergic reactions, rashes  ALLERGIES: Allergies  Allergen Reactions   Doxycycline  Hives and Itching    Hives and itching   Penicillins Swelling    Swelling, rash   Azathioprine Other (See Comments)    Nausea   Mycophenolate Mofetil Cough   Azithromycin  Other (See Comments)    Other reaction(s): abdominal pain   Codeine Other (See Comments)     nausea   Sulfa Antibiotics Rash  HOME MEDICATIONS: Outpatient Medications Prior to Visit  Medication Sig Dispense Refill   acetaminophen  (TYLENOL ) 325 MG tablet Take 650 mg by mouth every 6 (six) hours as needed for mild pain, fever or headache. (Patient taking differently: Take 650 mg by mouth as needed for mild pain (pain score 1-3), fever or headache.)     allopurinol (ZYLOPRIM) 100 MG tablet Take 150 mg by mouth daily. (Patient taking differently: Take 100 mg by mouth daily.)     apixaban  (ELIQUIS ) 5 MG TABS tablet Take 1 tablet (5 mg total) by mouth 2 (two) times daily. 60  tablet 5   azelastine  (ASTELIN ) 0.1 % nasal spray Place 2 sprays into both nostrils 2 (two) times daily. Use in each nostril as directed 30 mL 12   benzonatate  (TESSALON ) 200 MG capsule Take 1 capsule (200 mg total) by mouth 3 (three) times daily as needed. 45 capsule 3   Cholecalciferol (D3 2000) 50 MCG (2000 UT) CAPS Take 2,000 Units by mouth daily. (Patient taking differently: Take 5,000 Units by mouth daily.)     Dextromethorphan  HBr (DELSYM  PO) Take by mouth 2 (two) times daily.     dicyclomine (BENTYL) 10 MG capsule Take 10 mg by mouth as needed for spasms.     ezetimibe (ZETIA) 10 MG tablet Take 10 mg by mouth daily.     famotidine  (PEPCID ) 20 MG tablet Take 20 mg by mouth daily. (Patient taking differently: Take 20 mg by mouth in the morning and at bedtime.)     felodipine  (PLENDIL ) 10 MG 24 hr tablet Take 10 mg by mouth daily.     fexofenadine (ALLEGRA) 60 MG tablet Take 60 mg by mouth daily as needed for allergies or rhinitis.     fluorometholone (FML) 0.1 % ophthalmic suspension Place 1 drop into both eyes 3 (three) times daily.     furosemide  (LASIX ) 40 MG tablet Take 1 tablet (40 mg total) by mouth daily as needed. (Patient taking differently: Take 40 mg by mouth as needed.)     ketorolac  (ACULAR ) 0.5 % ophthalmic solution Place 1 drop into both eyes in the morning, at noon, in the evening, and at bedtime.     lamoTRIgine  (LAMICTAL ) 150 MG tablet Take 2 tablets (300 mg total) by mouth 2 (two) times daily. 360 tablet 3   losartan  (COZAAR ) 100 MG tablet Take 1 tablet (100 mg total) by mouth daily. 90 tablet 1   Multiple Vitamin (MULTIVITAMIN) tablet Take 1 tablet by mouth daily.     predniSONE  (DELTASONE ) 10 MG tablet 4 tabs for 2 days, then 3 tabs for 2 days, 2 tabs for 2 days, then 1 tab for 2 days, then stop 20 tablet 0   primidone  (MYSOLINE ) 50 MG tablet Take 1.5 tablets (75 mg total) by mouth 2 (two) times daily. TAKE ONE TABLET BY MOUTH EVERY MORNING AND TAKE ONE TABLET BY MOUTH  EVERY NIGHT AT BEDTIME (Patient taking differently: Take 50 mg by mouth 2 (two) times daily. Starting 06/17/24 pt was advised to decrease to 1 pill at bedtime x 1 week then discontinue due to interaction with Eliquis .) 270 tablet 1   propranolol  (INDERAL ) 10 MG tablet Take 1 tablet (10 mg total) by mouth 2 (two) times daily. 180 tablet 3   rosuvastatin (CRESTOR) 20 MG tablet Take 20 mg by mouth at bedtime.     spironolactone  (ALDACTONE ) 25 MG tablet TAKE ONE TABLET BY MOUTH ONE TIME DAILY 90 tablet 3   zolpidem  (AMBIEN ) 10 MG  tablet Take 5 mg by mouth at bedtime as needed for sleep.     No facility-administered medications prior to visit.    PAST MEDICAL HISTORY: Past Medical History:  Diagnosis Date   Afib (HCC)    Cancer (HCC)    Elevated diaphragm    Gout    Heart failure (HCC) 04/05/2021   History of prostate cancer 09/07/1991   Hyperlipidemia    Hypertension    Hypoxemia    Normocytic anemia    OSA (obstructive sleep apnea)    Peripheral neuropathy    Pneumonia    Vision abnormalities     PAST SURGICAL HISTORY: Past Surgical History:  Procedure Laterality Date   APPENDECTOMY     CARDIOVERSION N/A 04/24/2024   Procedure: CARDIOVERSION;  Surgeon: Francyne Headland, MD;  Location: [facility];  Service: Cardiovascular;  Laterality: N/A;   PROSTATECTOMY     SHOULDER SURGERY     bone spur removed from right    FAMILY HISTORY: Family History  Problem Relation Age of Onset   Emphysema Father    Congestive Heart Failure Mother    Emphysema Brother    Asthma Daughter     SOCIAL HISTORY:  Social History   Socioeconomic History   Marital status: Married    Spouse name: Not on file   Number of children: 2   Years of education: Not on file   Highest education level: Not on file  Occupational History   Occupation: Retired    Comment: Optician, [employer]  Tobacco Use   Smoking status: Never   Smokeless tobacco: Never  Vaping Use   Vaping status: Never Used  Substance  and Sexual Activity   Alcohol use: Not Currently   Drug use: No   Sexual activity: Not Currently  Other Topics Concern   Not on file  Social History Narrative   Not on file   Social Drivers of Health   Financial Resource Strain: Not on file  Food Insecurity: Not on file  Transportation Needs: Not on file  Physical Activity: Not on file  Stress: Not on file  Social Connections: Not on file  Intimate Partner Violence: Not on file     PHYSICAL EXAM  Vitals:   06/24/24 1012 06/24/24 1016  BP: (!) 166/93 (!) 143/93  Pulse: 71   Weight: 191 lb (86.6 kg)   Height: 5' 11 (1.803 m)     Body mass index is 26.64 kg/m.   General: The patient is well-developed and well-nourished and in no acute distress.        Neurologic Exam  Mental status: The patient is alert and oriented x 3 at the time of the examination. The patient has apparent normal recent and remote memory, with an apparently normal attention span and concentration ability.   Speech is normal.  Cranial nerves: Extraocular movements are full.  Facial strength and sensation is normal.. The left pupil is irregular (old) trapezius and sternocleidomastoid strength is normal. No dysarthria is noted.  Hearing is symmetric.   Motor:  R=L hand tremor increased compared to  Muscle bulk is reduecd in the calves.   Muscle tone is normal. Strength is  5 / 5 in all 4 extremities.     Sensory: Sensation is intact to temperature, touch and vibration in the arms and hands.  Vibration sensation is reduced to 10-20% at the ankles and near absent at the toes.. Touch was just slightly reduced in feet.     Coordination: He has good  finger-nose-finger and heel-to-shin.  Gait and station: Station is stable with the eyes open.  His gait is fairly normal for age.  The tandem gait is moderately wide.  He has just a couple steps of retropulsion .  Romberg is negative.  Reflexes: Deep tendon reflexes are increased at the  knees.    DIAGNOSTIC DATA (LABS, IMAGING, TESTING) - I reviewed patient records, labs, notes, testing and imaging myself where available.  Lab Results  Component Value Date   WBC 6.0 05/18/2024   HGB 16.2 05/18/2024   HCT 46.6 05/18/2024   MCV 93.4 05/18/2024   PLT 148 (L) 05/18/2024      Component Value Date/Time   NA 135 05/18/2024 1406   NA 140 04/20/2024 1239   K 4.6 05/18/2024 1406   CL 101 05/18/2024 1406   CO2 23 05/18/2024 1406   GLUCOSE 124 (H) 05/18/2024 1406   BUN 16 05/18/2024 1406   BUN 14 04/20/2024 1239   CREATININE 1.15 05/18/2024 1406   CALCIUM  9.5 05/18/2024 1406   PROT 6.3 (L) 03/11/2021 0537   PROT 6.8 10/03/2020 1335   ALBUMIN 3.0 (L) 03/13/2021 0413   AST 18 03/11/2021 0537   ALT 13 03/11/2021 0537   ALKPHOS 87 03/11/2021 0537   BILITOT 0.9 03/11/2021 0537   GFRNONAA >60 05/18/2024 1406   GFRAA 67 04/13/2020 1104      ASSESSMENT AND PLAN  Polyneuropathy  Essential tremor  Myalgia  Dysesthesia  Persistent atrial fibrillation (HCC)  Long term (current) use of anticoagulants   1.    Taper off Mysoline  over the next week and continue propranolol  for tremor.   He will ask his cardiologist at the next visit if the propranolol  dose can be increased (or changed to a different beta-blocker if preferred by cardiology) 2.    Continue lamortigine for dysesthesias.  Although the presumptive diagnosis has been polyneuropathy due to the distribution of the numbness, NCV/EMG did not show significant changes.  Additionally, he has reflexes in the legs.  MRI of the cervical spine in 2016, however, had not shown any spinal cord changes.   To help with the pain we will add low-dose tramadol .    3.  RTC 10 months, sooner if problems  Cypress Hinkson A. Vear, MD, PhD 06/24/2024, 10:48 AM Certified in Neurology, Clinical Neurophysiology, Sleep Medicine, Pain Medicine and Neuroimaging  [facility] [address], [address] [place], [place]  [address] [phone]

## 2024-06-24 NOTE — Patient Instructions (Signed)
 Because of drug interactions with Eliquis , we are tapering your Mysoline  off.  Your benign essential tremor would likely worsen.  You are also on a low-dose of propranolol  (only 10 mg twice a day).  At your next cardiology appointment.  Please ask your doctor if your dose of beta-blocker (propranolol  or other medication) can be increased as this category of medication will often help the tremor.

## 2024-06-25 ENCOUNTER — Ambulatory Visit: Attending: Cardiology

## 2024-06-25 DIAGNOSIS — I10 Essential (primary) hypertension: Secondary | ICD-10-CM

## 2024-06-25 DIAGNOSIS — I5032 Chronic diastolic (congestive) heart failure: Secondary | ICD-10-CM

## 2024-06-25 DIAGNOSIS — I480 Paroxysmal atrial fibrillation: Secondary | ICD-10-CM

## 2024-06-25 DIAGNOSIS — E782 Mixed hyperlipidemia: Secondary | ICD-10-CM

## 2024-06-25 NOTE — Telephone Encounter (Signed)
 Spoke with [place], and she will have to follow up with [facility] monitors to find out where the result is on the patient's monitor.   Wife reports that pt SBP is 150 and higher on a regular basis, pt is asymptomatic.   Also needing to find a beta blocker that patient can take for bother heart and trimmer.  Spoke with Dr. Elmira and appt has been made for 07/01/24. Slater agrees with plan of care.

## 2024-06-30 ENCOUNTER — Ambulatory Visit: Payer: Self-pay | Admitting: Cardiology

## 2024-06-30 DIAGNOSIS — I10 Essential (primary) hypertension: Secondary | ICD-10-CM

## 2024-06-30 DIAGNOSIS — I480 Paroxysmal atrial fibrillation: Secondary | ICD-10-CM | POA: Diagnosis not present

## 2024-06-30 DIAGNOSIS — I5032 Chronic diastolic (congestive) heart failure: Secondary | ICD-10-CM

## 2024-06-30 DIAGNOSIS — E782 Mixed hyperlipidemia: Secondary | ICD-10-CM

## 2024-07-01 ENCOUNTER — Encounter: Payer: Self-pay | Admitting: Cardiology

## 2024-07-01 ENCOUNTER — Ambulatory Visit: Attending: Cardiology | Admitting: Cardiology

## 2024-07-01 VITALS — BP 158/78 | HR 53 | Ht 70.0 in | Wt 186.0 lb

## 2024-07-01 DIAGNOSIS — I1A0 Resistant hypertension: Secondary | ICD-10-CM | POA: Insufficient documentation

## 2024-07-01 DIAGNOSIS — I480 Paroxysmal atrial fibrillation: Secondary | ICD-10-CM

## 2024-07-01 MED ORDER — HYDROCHLOROTHIAZIDE 25 MG PO TABS: 25.0000 mg | ORAL_TABLET | Freq: Every day | ORAL | 3 refills | Status: AC

## 2024-07-01 MED ORDER — APIXABAN 5 MG PO TABS: 5.0000 mg | ORAL_TABLET | Freq: Two times a day (BID) | ORAL | 3 refills | Status: DC

## 2024-07-01 NOTE — Progress Notes (Signed)
 Cardiology Office Note:  .   Date:  07/01/2024  ID:  [name], DOB 06/24/1946, MRN [id number] PCP: Clarice Nottingham, MD   HeartCare Providers Cardiologist:  Newman Lawrence, MD PCP: Clarice Nottingham, MD  Chief Complaint  Patient presents with   Hypertension      History of Present Illness: .    [name] is a 78 y.o. male with hypertension, h/o prostate cancer, peripheral neuropathy, HFpEF, essential tremor  Blood pressure remains elevated with average 160/80 mmHg.  Reviewed recent cardiac monitor results with the patient.  Patient did not tolerate hydralazine  due to headache, and labetalol  due to cough.  He is currently on losartan  100mg  daily, losartan  100 mg daily, spironolactone  25 mg daily.  Vitals:   07/01/24 1549  BP: (!) 158/78  Pulse: (!) 53  SpO2: 95%     ROS:  Review of Systems  Cardiovascular:  Negative for chest pain, dyspnea on exertion, leg swelling, palpitations and syncope.     Studies Reviewed: SABRA       EKG 04/20/2024: Atrial fibrillation 72 bpm When compared with ECG of 23-Mar-2024 11:26, No significant change was found  Mobile cardiac outpatient telemetry 30 days 05/10/2024 - 06/08/2024: Dominant rhythm: Sinus. HR 34-108 bpm. Avg HR 61 bpm. Atrial fibrillation occurred 88 times, with heart rate range 50-105 bpm, total AF burden 2%. No SVT/VT/high grade AV block, sinus pause >3sec noted. 2% PAC, 1% PVC noted. 0 patient triggered events.       Echocardiogram 04/2024:  1. Left ventricular ejection fraction, by estimation, is 60 to 65%. The  left ventricle has normal function. The left ventricle has no regional  wall motion abnormalities. Left ventricular diastolic function could not  be evaluated.   2. Right ventricular systolic function is normal. The right ventricular  size is normal. There is normal pulmonary artery systolic pressure.   3. Left atrial size was moderately dilated.   4. The mitral valve is normal in  structure. Mild mitral valve  regurgitation. No evidence of mitral stenosis.   5. The aortic valve is tricuspid. There is mild calcification of the  aortic valve. Aortic valve regurgitation is trivial. No aortic stenosis is  present.   6. The inferior vena cava is normal in size with greater than 50%  respiratory variability, suggesting right atrial pressure of 3 mmHg.   Comparison(s): Prior images unable to be directly viewed, comparison made  by report only.   Conclusion(s)/Recommendation(s): Otherwise normal echocardiogram, with  minor abnormalities described in the report. Technically limited study,  poor parasternal and apical windows. In afib throughout study.   Labs 05/2024: Hb 16.2 Cr 1.15  Labs 04/2024: Cr 1.17, eGFR 63, K 5.0 CK 56    Labs 07/2023: Chol 137, TG 178, HDL 47, LDL 60 Hb 11 Cr 1.2   Echocardiogram 04/2024:   1. Left ventricular ejection fraction, by estimation, is 60 to 65%. The left ventricle has normal function. The left ventricle has no regional wall motion abnormalities. Left ventricular diastolic function could not be evaluated.  2. Right ventricular systolic function is normal. The right ventricular size is normal. There is normal pulmonary artery systolic pressure.  3. Left atrial size was moderately dilated.  4. The mitral valve is normal in structure. Mild mitral valve regurgitation. No evidence of mitral stenosis.  5. The aortic valve is tricuspid. There is mild calcification of the aortic valve. Aortic valve regurgitation is trivial. No aortic stenosis is present.  6. The inferior vena  cava is normal in size with greater than 50% respiratory variability, suggesting right atrial pressure of 3 mmHg.   Comparison(s): Prior images unable to be directly viewed, comparison made by report only.   Conclusion(s)/Recommendation(s): Otherwise normal echocardiogram, with minor abnormalities described in the report. Technically limited study, poor  parasternal and apical windows. In afib throughout study.    Physical Exam:   Physical Exam Vitals and nursing note reviewed.  Constitutional:      General: He is not in acute distress. Neck:     Vascular: No JVD.  Cardiovascular:     Rate and Rhythm: Normal rate. Rhythm irregular.     Heart sounds: Normal heart sounds. No murmur heard. Pulmonary:     Effort: Pulmonary effort is normal.     Breath sounds: Normal breath sounds. No wheezing or rales.  Musculoskeletal:     Right lower leg: No edema.     Left lower leg: No edema.    Risk Assessment/Calculations:    CHA2DS2-VASc Score = 3   This indicates a 3.2% annual risk of stroke. The patient's score is based upon: CHF History: 0 HTN History: 1 Diabetes History: 0 Stroke History: 0 Vascular Disease History: 0 Age Score: 2 Gender Score: 0   VISIT DIAGNOSES:   ICD-10-CM   1. Resistant hypertension  I1A.0 Basic metabolic panel with GFR    VAS US  RENAL ARTERY DUPLEX    2. Paroxysmal atrial fibrillation (HCC)  I48.0            ASSESSMENT AND PLAN: .    [name] is a 78 y.o. male with hypertension, h/o prostate cancer, peripheral neuropathy, HFpEF, essential tremor, new diagnosis A-fib 03/2024  Hypertension: Patient did not tolerate hydralazine  due to headache, and labetalol  due to cough.  He is currently on losartan  100 mg daily, losartan  100 mg daily, spironolactone  25 mg daily. Added hydrochlorothiazide  25 mg daily.  Check BMP in 1 week. Check renal artery duplex to look for renal artery stenosis. He has an appointment to see hypertension clinic in January.  Paroxysmal A-fib: 2% A-fib burden, no significant symptoms. Continue Eliquis  5 mg twice daily. Rate controlled without AV nodal blocking agents.    Signed, [name], MD

## 2024-07-01 NOTE — Patient Instructions (Signed)
 Medication Instructions:  START hydrochlorothiazide  25mg  daily   *If you need a refill on your cardiac medications before your next appointment, please call your pharmacy*  Lab Work: BMP IN 7 TO 10 DAYS   If you have labs (blood work) drawn today and your tests are completely normal, you will receive your results only by: MyChart Message (if you have MyChart) OR A paper copy in the mail If you have any lab test that is abnormal or we need to change your treatment, we will call you to review the results.  Testing/Procedures: RENAL ARTERY DUPLEX   Your physician has requested that you have a renal artery duplex. During this test, an ultrasound is used to evaluate blood flow to the kidneys. Allow one hour for this exam. Do not eat after midnight the day before and avoid carbonated beverages. Take your medications as you usually do.   Follow-Up: At [facility], you and your health needs are our priority.  As part of our continuing mission to provide you with exceptional heart care, our providers are all part of one team.  This team includes your primary Cardiologist (physician) and Advanced Practice Providers or APPs (Physician Assistants and Nurse Practitioners) who all work together to provide you with the care you need, when you need it.  Your next appointment:   KEEP UPCOMING APPOINTMENT  Provider:   [name], MD

## 2024-07-07 ENCOUNTER — Ambulatory Visit: Admitting: Cardiology

## 2024-07-09 ENCOUNTER — Other Ambulatory Visit (HOSPITAL_COMMUNITY): Payer: Self-pay

## 2024-07-13 LAB — LAB REPORT - SCANNED
Albumin, Urine POC: 25.1
Creatinine, POC: 88 mg/dL
EGFR: 54
Microalb Creat Ratio: 29

## 2024-07-15 ENCOUNTER — Ambulatory Visit: Payer: Self-pay | Admitting: Cardiology

## 2024-07-15 LAB — BASIC METABOLIC PANEL WITH GFR
BUN/Creatinine Ratio: 13 (ref 10–24)
BUN: 17 mg/dL (ref 8–27)
CO2: 26 mmol/L (ref 20–29)
Calcium: 10.1 mg/dL (ref 8.6–10.2)
Chloride: 102 mmol/L (ref 96–106)
Creatinine, Ser: 1.28 mg/dL — AB (ref 0.76–1.27)
Glucose: 70 mg/dL (ref 70–99)
Potassium: 4.7 mmol/L (ref 3.5–5.2)
Sodium: 142 mmol/L (ref 134–144)
eGFR: 57 mL/min/1.73 — AB (ref >59)

## 2024-07-22 ENCOUNTER — Ambulatory Visit (HOSPITAL_COMMUNITY): Admission: RE | Admit: 2024-07-22 | Discharge: 2024-07-22 | Attending: Cardiology

## 2024-07-22 DIAGNOSIS — I1A0 Resistant hypertension: Secondary | ICD-10-CM | POA: Insufficient documentation

## 2024-07-24 ENCOUNTER — Telehealth: Payer: Self-pay | Admitting: Cardiology

## 2024-07-24 ENCOUNTER — Ambulatory Visit: Admitting: Cardiology

## 2024-07-24 NOTE — Telephone Encounter (Signed)
 Pts wife calling for husband. He had to cancel appt today where he was going to discuss kidney arterial test and bp medication management. Pt rescheduled to next available 08/25/24, but wife feels that is not soon enough. Please advise.

## 2024-07-24 NOTE — Telephone Encounter (Signed)
 I spoke with the patient, you do not need to call. No evidence of renal artery stenosis.  Blood pressure generally well-controlled, higher today.  Okay to take additional dose of propranolol .  Has follow-up appoint with me next month.  [name], MD

## 2024-07-24 NOTE — Telephone Encounter (Signed)
 Spoke with patient wife regarding appointment and results. Wife asking Renal US  results. States pt is dizzy this morning and doesn't want to come in today. Will send to Dr. Elmira and can send results via MyChart-per wife. Appt made for earliest appt in January. Verbalizes understanding of plan.

## 2024-08-25 ENCOUNTER — Encounter: Payer: Self-pay | Admitting: Neurology

## 2024-08-25 ENCOUNTER — Encounter: Payer: Self-pay | Admitting: Cardiology

## 2024-08-25 ENCOUNTER — Other Ambulatory Visit: Payer: Self-pay | Admitting: Neurology

## 2024-08-25 ENCOUNTER — Ambulatory Visit: Admitting: Cardiology

## 2024-08-25 VITALS — BP 122/77 | HR 94 | Ht 71.0 in | Wt 191.5 lb

## 2024-08-25 DIAGNOSIS — I480 Paroxysmal atrial fibrillation: Secondary | ICD-10-CM

## 2024-08-25 DIAGNOSIS — I10 Essential (primary) hypertension: Secondary | ICD-10-CM | POA: Diagnosis not present

## 2024-08-25 MED ORDER — PROPRANOLOL HCL 10 MG PO TABS: 10.0000 mg | ORAL_TABLET | Freq: Two times a day (BID) | ORAL | 3 refills | Status: AC

## 2024-08-25 NOTE — Progress Notes (Signed)
 " Cardiology Office Note:  .   Date:  08/25/2024  ID:  [name], DOB Feb 06, 1946, MRN [id number] PCP: Clarice Nottingham, MD   [facility] Providers Cardiologist:  Newman Lawrence, MD PCP: Clarice Nottingham, MD  Chief Complaint  Patient presents with   PAF      History of Present Illness: .    [name] is a 79 y.o. male with hypertension, h/o prostate cancer, peripheral neuropathy, HFpEF, essential tremor, paroxysmal Afib  Patient was switched from primidone  to propranolol  for management of his essential tremor after he was started on Eliquis  for his A-fib.  The dose was increased to 20 mg 3 times daily by Dr. Clarice after which she had dizziness and thus stopped propranolol .  Since then, he has noticed elevated heart rate.  Otherwise, he does not have any symptoms.  He only notices A-fib when he puts his fingers on [employer] mobile.  Blood pressure is now very well-controlled.  He denies any chest pain, shortness of breath symptoms.  Vitals:   08/25/24 1413  BP: 122/77  Pulse: 94  SpO2: 95%     ROS:  Review of Systems  Cardiovascular:  Negative for chest pain, dyspnea on exertion, leg swelling, palpitations and syncope.  Neurological:  Positive for dizziness.     Studies Reviewed: SABRA       EKG 04/20/2024: Atrial fibrillation 72 bpm When compared with ECG of 23-Mar-2024 11:26, No significant change was found  Mobile cardiac outpatient telemetry 30 days 05/10/2024 - 06/08/2024: Dominant rhythm: Sinus. HR 34-108 bpm. Avg HR 61 bpm. Atrial fibrillation occurred 88 times, with heart rate range 50-105 bpm, total AF burden 2%. No SVT/VT/high grade AV block, sinus pause >3sec noted. 2% PAC, 1% PVC noted. 0 patient triggered events.       Echocardiogram 04/2024:  1. Left ventricular ejection fraction, by estimation, is 60 to 65%. The  left ventricle has normal function. The left ventricle has no regional  wall motion abnormalities. Left ventricular diastolic  function could not  be evaluated.   2. Right ventricular systolic function is normal. The right ventricular  size is normal. There is normal pulmonary artery systolic pressure.   3. Left atrial size was moderately dilated.   4. The mitral valve is normal in structure. Mild mitral valve  regurgitation. No evidence of mitral stenosis.   5. The aortic valve is tricuspid. There is mild calcification of the  aortic valve. Aortic valve regurgitation is trivial. No aortic stenosis is  present.   6. The inferior vena cava is normal in size with greater than 50%  respiratory variability, suggesting right atrial pressure of 3 mmHg.   Comparison(s): Prior images unable to be directly viewed, comparison made  by report only.   Conclusion(s)/Recommendation(s): Otherwise normal echocardiogram, with  minor abnormalities described in the report. Technically limited study,  poor parasternal and apical windows. In afib throughout study.   Labs 10-07/2024: Hb 16.2 Cr 1.28, eGFR 57  Labs 05/2024: Hb 16.2 Cr 1.15  Labs 04/2024: Cr 1.17, eGFR 63, K 5.0 CK 56    Labs 07/2023: Chol 137, TG 178, HDL 47, LDL 60 Hb 11 Cr 1.2   Echocardiogram 04/2024:   1. Left ventricular ejection fraction, by estimation, is 60 to 65%. The left ventricle has normal function. The left ventricle has no regional wall motion abnormalities. Left ventricular diastolic function could not be evaluated.  2. Right ventricular systolic function is normal. The right ventricular size is normal. There is  normal pulmonary artery systolic pressure.  3. Left atrial size was moderately dilated.  4. The mitral valve is normal in structure. Mild mitral valve regurgitation. No evidence of mitral stenosis.  5. The aortic valve is tricuspid. There is mild calcification of the aortic valve. Aortic valve regurgitation is trivial. No aortic stenosis is present.  6. The inferior vena cava is normal in size with greater than 50% respiratory  variability, suggesting right atrial pressure of 3 mmHg.   Comparison(s): Prior images unable to be directly viewed, comparison made by report only.   Conclusion(s)/Recommendation(s): Otherwise normal echocardiogram, with minor abnormalities described in the report. Technically limited study, poor parasternal and apical windows. In afib throughout study.    Physical Exam:   Physical Exam Vitals and nursing note reviewed.  Constitutional:      General: He is not in acute distress. Neck:     Vascular: No JVD.  Cardiovascular:     Rate and Rhythm: Normal rate. Rhythm irregular.     Heart sounds: Normal heart sounds. No murmur heard. Pulmonary:     Effort: Pulmonary effort is normal.     Breath sounds: Normal breath sounds. No wheezing or rales.  Musculoskeletal:     Right lower leg: No edema.     Left lower leg: No edema.    Risk Assessment/Calculations:    CHA2DS2-VASc Score = 3   This indicates a 3.2% annual risk of stroke. The patient's score is based upon: CHF History: 0 HTN History: 1 Diabetes History: 0 Stroke History: 0 Vascular Disease History: 0 Age Score: 2 Gender Score: 0   VISIT DIAGNOSES:   ICD-10-CM   1. Paroxysmal atrial fibrillation (HCC)  I48.0     2. Essential hypertension  I10          ASSESSMENT AND PLAN: .    [name] is a 79 y.o. male with hypertension, h/o prostate cancer, peripheral neuropathy, HFpEF, essential tremor, paroxysmal Afib  Hypertension: Now well controlled. Continue current antihypertensive therapy.   Paroxysmal A-fib: 2% A-fib burden, no significant symptoms. Continue Eliquis  5 mg twice daily. Rate controlled without AV nodal blocking agents. That said, his resting heart rate seems to be higher since he discontinued propranolol  which he was taking for his essential tremors. Consider resuming propranolol  10 mg twice daily.  If not tolerated well, discussed with Dr. Sater regarding alternate treatment.  If he were  to go back to primidone , we may need to switch Eliquis  to warfarin.    F/u in 3 months  Signed, [name], MD  "

## 2024-08-25 NOTE — Patient Instructions (Signed)

## 2024-08-28 ENCOUNTER — Ambulatory Visit (HOSPITAL_BASED_OUTPATIENT_CLINIC_OR_DEPARTMENT_OTHER): Admitting: Pulmonary Disease

## 2024-09-03 ENCOUNTER — Ambulatory Visit (HOSPITAL_BASED_OUTPATIENT_CLINIC_OR_DEPARTMENT_OTHER): Admitting: Family

## 2024-09-03 ENCOUNTER — Encounter (HOSPITAL_BASED_OUTPATIENT_CLINIC_OR_DEPARTMENT_OTHER): Payer: Self-pay | Admitting: Family

## 2024-09-03 VITALS — BP 123/75 | HR 81 | Ht 71.0 in | Wt 189.9 lb

## 2024-09-03 DIAGNOSIS — I5032 Chronic diastolic (congestive) heart failure: Secondary | ICD-10-CM | POA: Diagnosis not present

## 2024-09-03 DIAGNOSIS — D6859 Other primary thrombophilia: Secondary | ICD-10-CM

## 2024-09-03 DIAGNOSIS — I480 Paroxysmal atrial fibrillation: Secondary | ICD-10-CM | POA: Diagnosis not present

## 2024-09-03 DIAGNOSIS — I10 Essential (primary) hypertension: Secondary | ICD-10-CM | POA: Diagnosis not present

## 2024-09-03 NOTE — Progress Notes (Signed)
 "  [facility] Initial Assessment:    Date:  09/03/2024   ID:  AMAD MAU, DOB [birth year], MRN [id number]  PCP:  Clarice Nottingham, MD  Cardiologist:  [name], MD  Nephrologist:  Referring MD: Nellene Quita SAUNDERS, PA   CC: Hypertension  History of Present Illness:    [name] is a 79 y.o. male with a hx of HTN, HFpEF, prostate cancer, peripheral neuropathy, essential tremor, PAF. Here to establish care in the [facility].   Previously on primidone , later changed to Propranolol  when started on eliquis  for AFib.   Echo 04/2024 normal LVEF 60-65%, indeterminate diastolic function, RVSF normal, moderate LAE, mild MR, trivial AI.   Referred by [facility] Clinic 06/11/24 with BP 182/92. At follow up with Dr. Lawrence 06/11/24 hydrochlorothiazide  25mg  daily was initiated. Losartan  100mg  daily, spironolactone  25mg  daily were continued. 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   At follow up with Dr. Lawrence 08/25/24 BP 122/77.   Presents today to establish with [facility] with his wife. BP checked with arm cuff at home routinely at goal <130/80 since addition of hydrochlorothiazide . Tolerates without issue. Not requiring PRN Lasix . No chest pain, exertional dyspnea, palpitations, LE edema. Recently enjoyed trip to [place]. He is planning to resume Propranolol  BID for tremor, previously on TID dosing had bradycardia and lightheadedness.   Previous antihypertensives: Hydralazine  - headache Labetalol  - cough  Past Medical History:  Diagnosis Date   Afib (HCC)    Cancer (HCC)    Elevated diaphragm    Gout    Heart failure (HCC) 04/05/2021   History of prostate cancer 09/07/1991   Hyperlipidemia    Hypertension    Hypoxemia    Normocytic anemia    OSA (obstructive sleep apnea)    Peripheral neuropathy    Pneumonia    Vision  abnormalities     Past Surgical History:  Procedure Laterality Date   APPENDECTOMY     CARDIOVERSION N/A 04/24/2024   Procedure: CARDIOVERSION;  Surgeon: Francyne Headland, MD;  Location: [facility];  Service: Cardiovascular;  Laterality: N/A;   PROSTATECTOMY     SHOULDER SURGERY     bone spur removed from right    Current Medications: Active Medications[1]   Allergies:   Doxycycline , Penicillins, Azathioprine, Mycophenolate mofetil, Azithromycin , Codeine, and Sulfa antibiotics   Social History   Socioeconomic History   Marital status: Married    Spouse name: Not on file   Number of children: 2   Years of education: Not on file   Highest education level: Not on file  Occupational History   Occupation: Retired    Comment: Optician, [employer]  Tobacco Use   Smoking status: Never   Smokeless tobacco: Never  Vaping Use   Vaping status: Never Used  Substance and Sexual Activity   Alcohol use: Not Currently   Drug use: No   Sexual activity: Not Currently  Other Topics Concern   Not on file  Social History Narrative   Not on file   Social Drivers of Health   Tobacco Use: Low Risk (08/25/2024)   Patient History    Smoking Tobacco Use: Never    Smokeless Tobacco Use: Never    Passive Exposure: Not on file  Financial Resource Strain: Not on file  Food Insecurity: Not on file  Transportation Needs: Not on file  Physical Activity: Not on file  Stress:  Not on file  Social Connections: Not on file  Depression (EYV7-0): Not on file  Alcohol Screen: Not on file  Housing: Not on file  Utilities: Not on file  Health Literacy: Not on file     Family History: The patient's family history includes Asthma in his daughter; Congestive Heart Failure in his mother; Emphysema in his brother and father.  ROS:   Please see the history of present illness.     All other systems reviewed and are negative.  EKGs/Labs/Other Studies Reviewed:         Recent Labs: 03/23/2024: NT-Pro  BNP 277 05/18/2024: Hemoglobin 16.2; Platelets 148 07/14/2024: BUN 17; Creatinine, Ser 1.28; Potassium 4.7; Sodium 142   Recent Lipid Panel    Component Value Date/Time   CHOL 126 01/20/2011 0515   TRIG 60 01/20/2011 0515   HDL 51 01/20/2011 0515   CHOLHDL 2.5 01/20/2011 0515   VLDL 12 01/20/2011 0515   LDLCALC 63 01/20/2011 0515    Physical Exam:   VS:  There were no vitals taken for this visit. , BMI There is no height or weight on file to calculate BMI. GENERAL:  Well appearing HEENT: Pupils equal round and reactive, fundi not visualized, oral mucosa unremarkable NECK:  No jugular venous distention, waveform within normal limits, carotid upstroke brisk and symmetric, no bruits, no thyromegaly LYMPHATICS:  No cervical adenopathy LUNGS:  Clear to auscultation bilaterally HEART:  RRR.  PMI not displaced or sustained,S1 and S2 within normal limits, no S3, no S4, no clicks, no rubs, no murmurs ABD:  Flat, positive bowel sounds normal in frequency in pitch, no bruits, no rebound, no guarding, no midline pulsatile mass, no hepatomegaly, no splenomegaly EXT:  2 plus pulses throughout, no edema, no cyanosis no clubbing SKIN:  No rashes no nodules NEURO:  Cranial nerves II through XII grossly intact, motor grossly intact throughout PSYCH:  Cognitively intact, oriented to person place and time   ASSESSMENT/PLAN:    HTN - BP well controlled. Continue current antihypertensive regimen Felopidine 10mg  daily, spironolactone  25mg  daily, hydrochlorothiazide  25 mg daily, losartan  100mg  daily. If BP elevated in future, consider switch from Losartan  to Valsartan.  HFpEF - Euvolemic and well compensated on exam. Continue lasix  PRN, has not required in some time.  PAF / Hypercoagulable state - RRR by auscultation. CHA2DS2-VASc Score = 3 [CHF History: 0, HTN History: 1, Diabetes History: 0, Stroke History: 0, Vascular Disease History: 0, Age Score: 2, Gender Score: 0].  Therefore, the patient's annual  risk of stroke is 3.2 %.    Continue Eliquis  5mg  BID. Discussed if wished to switch from Propranolol  to Primidone  in future would need to change from Eliquis  to Warfarin.   Screening for Secondary Hypertension:     Relevant Labs/Studies:    Latest Ref Rng & Units 07/14/2024    2:50 PM 05/18/2024    2:06 PM 04/20/2024   12:39 PM  Basic Labs  Sodium 134 - 144 mmol/L 142  135  140   Potassium 3.5 - 5.2 mmol/L 4.7  4.6  5.1   Creatinine 0.76 - 1.27 mg/dL 8.71  8.84  8.75        Latest Ref Rng & Units 02/08/2011   12:20 PM 01/20/2011    5:15 AM  Thyroid    TSH 0.350 - 4.500 uIU/mL 2.255  1.270                 07/22/2024    9:00 AM  Renovascular   Renal  Artery US  Completed Yes     Disposition:    FU with [facility] PRN   Medication Adjustments/Labs and Tests Ordered: Current medicines are reviewed at length with the patient today.  Concerns regarding medicines are outlined above.  No orders of the defined types were placed in this encounter.  No orders of the defined types were placed in this encounter.    Signed, Reche GORMAN Finder, NP  09/03/2024 9:13 AM    [facility]     [1]  No outpatient medications have been marked as taking for the 09/03/24 encounter (Appointment) with Finder Reche GORMAN, NP.   "

## 2024-09-03 NOTE — Patient Instructions (Addendum)
 Medication Instructions:   Your physician recommends that you continue on your current medications as directed. Please refer to the Current Medication list given to you today.   Follow-Up: Please follow up as needed in [facility] with Dr. Raford, Reche Finder, NP or Allean Mink PharmD    Follow-up as scheduled with Dr. Elmira in July--you will get a call back soon to get this scheduled.    Special Instructions:   If BP elevated in the future, could switch Losartan  to Valsartan or Olmesartan.

## 2024-09-08 ENCOUNTER — Telehealth: Payer: Self-pay | Admitting: Cardiology

## 2024-09-08 DIAGNOSIS — I4819 Other persistent atrial fibrillation: Secondary | ICD-10-CM

## 2024-09-08 NOTE — Telephone Encounter (Signed)
 Order placed and wife is aware.

## 2024-09-08 NOTE — Telephone Encounter (Signed)
Yes  Thanks MJP

## 2024-09-08 NOTE — Telephone Encounter (Signed)
 Let us  see how propranolol  dose adjustment works for his tremors, and stay on Eliquis  for now. If it does not work and if he needs to change to primidone , we will then change him to warfarin.  Thanks MJP

## 2024-09-09 ENCOUNTER — Encounter: Payer: Self-pay | Admitting: Neurology

## 2024-09-09 ENCOUNTER — Telehealth: Payer: Self-pay | Admitting: *Deleted

## 2024-09-09 DIAGNOSIS — I4891 Unspecified atrial fibrillation: Secondary | ICD-10-CM | POA: Insufficient documentation

## 2024-09-09 DIAGNOSIS — I480 Paroxysmal atrial fibrillation: Secondary | ICD-10-CM

## 2024-09-09 DIAGNOSIS — Z5181 Encounter for therapeutic drug level monitoring: Secondary | ICD-10-CM | POA: Insufficient documentation

## 2024-09-09 MED ORDER — WARFARIN SODIUM 5 MG PO TABS: ORAL_TABLET | ORAL | 0 refills | Status: AC

## 2024-09-09 NOTE — Telephone Encounter (Signed)
 Called the patient and spoke with wife (on [place]) and pt on speaker phone. They states he is ready to start warfarin at this time. Advised that he will continue taking eliquis  5mg  twice a day for 3 days and take warfarin 5mg  along with it those 3 days as well and keep the warfarin going until appointment on 09/18/24 at 1030am. They will pick up the warfarin rx tomorrow and start taking Thursday or on Friday. Advised to call back if any questions. Also, advised that loni will have weekly checks until INR level stable then spread the appts out by a week. Advised to monitor for any bleeding and bruising and they verbalized understanding to the above.

## 2024-09-14 ENCOUNTER — Ambulatory Visit (HOSPITAL_BASED_OUTPATIENT_CLINIC_OR_DEPARTMENT_OTHER): Admitting: Pulmonary Disease

## 2024-09-18 ENCOUNTER — Ambulatory Visit

## 2025-04-21 ENCOUNTER — Ambulatory Visit: Admitting: Neurology
